# Patient Record
Sex: Female | Born: 1945 | Race: White | Hispanic: No | Marital: Married | Smoking: Former smoker
Health system: Southern US, Community
[De-identification: ages and names within clinical notes are randomized; demographics above are authoritative.]

## PROBLEM LIST (undated history)

## (undated) DIAGNOSIS — C4490 Unspecified malignant neoplasm of skin, unspecified: Secondary | ICD-10-CM

## (undated) DIAGNOSIS — E739 Lactose intolerance, unspecified: Secondary | ICD-10-CM

## (undated) DIAGNOSIS — F32A Depression, unspecified: Secondary | ICD-10-CM

## (undated) DIAGNOSIS — R011 Cardiac murmur, unspecified: Secondary | ICD-10-CM

## (undated) DIAGNOSIS — E785 Hyperlipidemia, unspecified: Secondary | ICD-10-CM

## (undated) DIAGNOSIS — M79609 Pain in unspecified limb: Secondary | ICD-10-CM

## (undated) DIAGNOSIS — Z86010 Personal history of colonic polyps: Secondary | ICD-10-CM

## (undated) DIAGNOSIS — F329 Major depressive disorder, single episode, unspecified: Secondary | ICD-10-CM

## (undated) DIAGNOSIS — E039 Hypothyroidism, unspecified: Secondary | ICD-10-CM

## (undated) DIAGNOSIS — D649 Anemia, unspecified: Secondary | ICD-10-CM

## (undated) HISTORY — DX: Lactose intolerance, unspecified: E73.9

## (undated) HISTORY — DX: Anemia, unspecified: D64.9

## (undated) HISTORY — DX: Personal history of colonic polyps: Z86.010

## (undated) HISTORY — PX: APPENDECTOMY: SHX54

## (undated) HISTORY — DX: Cardiac murmur, unspecified: R01.1

## (undated) HISTORY — PX: TONSILLECTOMY: SUR1361

## (undated) HISTORY — PX: VAGINAL HYSTERECTOMY: SUR661

## (undated) HISTORY — DX: Depression, unspecified: F32.A

## (undated) HISTORY — DX: Hyperlipidemia, unspecified: E78.5

## (undated) HISTORY — DX: Pain in unspecified limb: M79.609

## (undated) HISTORY — DX: Hypothyroidism, unspecified: E03.9

## (undated) HISTORY — DX: Major depressive disorder, single episode, unspecified: F32.9

---

## 1997-11-03 ENCOUNTER — Ambulatory Visit (HOSPITAL_COMMUNITY): Admission: RE | Admit: 1997-11-03 | Discharge: 1997-11-03 | Payer: Self-pay | Admitting: Gastroenterology

## 1998-02-06 ENCOUNTER — Other Ambulatory Visit: Admission: RE | Admit: 1998-02-06 | Discharge: 1998-02-06 | Payer: Self-pay | Admitting: Obstetrics and Gynecology

## 1998-02-07 ENCOUNTER — Other Ambulatory Visit: Admission: RE | Admit: 1998-02-07 | Discharge: 1998-02-07 | Payer: Self-pay | Admitting: Obstetrics and Gynecology

## 1998-05-16 ENCOUNTER — Ambulatory Visit (HOSPITAL_COMMUNITY): Admission: RE | Admit: 1998-05-16 | Discharge: 1998-05-16 | Payer: Self-pay | Admitting: Obstetrics and Gynecology

## 1998-11-26 ENCOUNTER — Observation Stay (HOSPITAL_COMMUNITY): Admission: RE | Admit: 1998-11-26 | Discharge: 1998-11-27 | Payer: Self-pay | Admitting: Obstetrics and Gynecology

## 1998-11-26 ENCOUNTER — Encounter (INDEPENDENT_AMBULATORY_CARE_PROVIDER_SITE_OTHER): Payer: Self-pay | Admitting: *Deleted

## 2000-08-17 ENCOUNTER — Other Ambulatory Visit: Admission: RE | Admit: 2000-08-17 | Discharge: 2000-08-17 | Payer: Self-pay | Admitting: Obstetrics and Gynecology

## 2001-05-24 ENCOUNTER — Ambulatory Visit (HOSPITAL_COMMUNITY): Admission: RE | Admit: 2001-05-24 | Discharge: 2001-05-24 | Payer: Self-pay | Admitting: Internal Medicine

## 2001-05-24 ENCOUNTER — Encounter: Payer: Self-pay | Admitting: Internal Medicine

## 2002-05-25 ENCOUNTER — Encounter: Payer: Self-pay | Admitting: Internal Medicine

## 2002-05-25 ENCOUNTER — Ambulatory Visit (HOSPITAL_COMMUNITY): Admission: RE | Admit: 2002-05-25 | Discharge: 2002-05-25 | Payer: Self-pay | Admitting: Internal Medicine

## 2003-05-29 ENCOUNTER — Ambulatory Visit (HOSPITAL_COMMUNITY): Admission: RE | Admit: 2003-05-29 | Discharge: 2003-05-29 | Payer: Self-pay | Admitting: Obstetrics and Gynecology

## 2003-07-04 ENCOUNTER — Encounter: Admission: RE | Admit: 2003-07-04 | Discharge: 2003-07-04 | Payer: Self-pay | Admitting: Obstetrics and Gynecology

## 2004-05-10 ENCOUNTER — Ambulatory Visit: Payer: Self-pay | Admitting: Internal Medicine

## 2004-05-14 ENCOUNTER — Ambulatory Visit: Payer: Self-pay | Admitting: Internal Medicine

## 2004-05-24 ENCOUNTER — Ambulatory Visit: Payer: Self-pay | Admitting: Internal Medicine

## 2004-05-30 ENCOUNTER — Ambulatory Visit (HOSPITAL_COMMUNITY): Admission: RE | Admit: 2004-05-30 | Discharge: 2004-05-30 | Payer: Self-pay | Admitting: Internal Medicine

## 2005-04-05 ENCOUNTER — Ambulatory Visit: Payer: Self-pay | Admitting: Internal Medicine

## 2005-05-09 ENCOUNTER — Ambulatory Visit: Payer: Self-pay | Admitting: Internal Medicine

## 2005-05-16 ENCOUNTER — Ambulatory Visit: Payer: Self-pay | Admitting: Internal Medicine

## 2005-05-22 ENCOUNTER — Encounter: Payer: Self-pay | Admitting: Internal Medicine

## 2005-05-22 ENCOUNTER — Ambulatory Visit: Payer: Self-pay | Admitting: *Deleted

## 2005-09-23 ENCOUNTER — Ambulatory Visit: Payer: Self-pay | Admitting: Internal Medicine

## 2005-09-25 ENCOUNTER — Ambulatory Visit: Payer: Self-pay | Admitting: Cardiology

## 2005-12-01 ENCOUNTER — Ambulatory Visit: Payer: Self-pay | Admitting: Internal Medicine

## 2006-03-20 ENCOUNTER — Ambulatory Visit: Payer: Self-pay | Admitting: Internal Medicine

## 2006-03-31 ENCOUNTER — Ambulatory Visit: Payer: Self-pay | Admitting: Internal Medicine

## 2006-05-05 ENCOUNTER — Ambulatory Visit: Payer: Self-pay | Admitting: Internal Medicine

## 2006-05-05 LAB — CONVERTED CEMR LAB
Albumin: 3.4 g/dL — ABNORMAL LOW (ref 3.5–5.2)
BUN: 21 mg/dL (ref 6–23)
CO2: 30 meq/L (ref 19–32)
Chloride: 108 meq/L (ref 96–112)
Chol/HDL Ratio, serum: 2.4
Cholesterol: 154 mg/dL (ref 0–200)
Creatinine, Ser: 1.3 mg/dL — ABNORMAL HIGH (ref 0.4–1.2)
Eosinophil percent: 12.1 % — ABNORMAL HIGH (ref 0.0–5.0)
GFR calc non Af Amer: 44 mL/min
HCT: 37.4 % (ref 36.0–46.0)
Lymphocytes Relative: 52.4 % — ABNORMAL HIGH (ref 12.0–46.0)
Monocytes Relative: 7.3 % (ref 3.0–11.0)
Neutro Abs: 1.9 10*3/uL (ref 1.4–7.7)
RBC: 4.14 M/uL (ref 3.87–5.11)
RDW: 13 % (ref 11.5–14.6)
TSH: 8.82 microintl units/mL — ABNORMAL HIGH (ref 0.35–5.50)
Total Bilirubin: 0.8 mg/dL (ref 0.3–1.2)
Total Protein: 6 g/dL (ref 6.0–8.3)
Triglyceride fasting, serum: 45 mg/dL (ref 0–149)
VLDL: 9 mg/dL (ref 0–40)

## 2006-05-14 ENCOUNTER — Ambulatory Visit: Payer: Self-pay | Admitting: Internal Medicine

## 2006-10-12 ENCOUNTER — Ambulatory Visit: Payer: Self-pay | Admitting: Internal Medicine

## 2006-10-13 LAB — CONVERTED CEMR LAB
BUN: 28 mg/dL — ABNORMAL HIGH (ref 6–23)
Creatinine, Ser: 1.1 mg/dL (ref 0.4–1.2)

## 2006-10-14 ENCOUNTER — Ambulatory Visit: Payer: Self-pay | Admitting: Internal Medicine

## 2006-10-15 ENCOUNTER — Ambulatory Visit: Payer: Self-pay | Admitting: Cardiology

## 2007-01-26 ENCOUNTER — Encounter: Payer: Self-pay | Admitting: Internal Medicine

## 2007-02-05 DIAGNOSIS — E785 Hyperlipidemia, unspecified: Secondary | ICD-10-CM

## 2007-02-05 DIAGNOSIS — E039 Hypothyroidism, unspecified: Secondary | ICD-10-CM | POA: Insufficient documentation

## 2007-02-05 HISTORY — DX: Hypothyroidism, unspecified: E03.9

## 2007-02-05 HISTORY — DX: Hyperlipidemia, unspecified: E78.5

## 2007-03-05 ENCOUNTER — Ambulatory Visit: Payer: Self-pay | Admitting: Internal Medicine

## 2007-05-04 ENCOUNTER — Ambulatory Visit (HOSPITAL_COMMUNITY): Admission: RE | Admit: 2007-05-04 | Discharge: 2007-05-04 | Payer: Self-pay | Admitting: Internal Medicine

## 2007-05-10 ENCOUNTER — Ambulatory Visit: Payer: Self-pay | Admitting: Internal Medicine

## 2007-05-10 LAB — CONVERTED CEMR LAB
ALT: 25 units/L (ref 0–35)
AST: 43 units/L — ABNORMAL HIGH (ref 0–37)
Albumin: 3.7 g/dL (ref 3.5–5.2)
Alkaline Phosphatase: 72 units/L (ref 39–117)
Basophils Relative: 0.5 % (ref 0.0–1.0)
CO2: 30 meq/L (ref 19–32)
Chloride: 108 meq/L (ref 96–112)
GFR calc Af Amer: 72 mL/min
GFR calc non Af Amer: 60 mL/min
Glucose, Bld: 122 mg/dL — ABNORMAL HIGH (ref 70–99)
LDL Cholesterol: 121 mg/dL — ABNORMAL HIGH (ref 0–99)
Lymphocytes Relative: 53.8 % — ABNORMAL HIGH (ref 12.0–46.0)
MCHC: 33.7 g/dL (ref 30.0–36.0)
MCV: 91.3 fL (ref 78.0–100.0)
Monocytes Relative: 9.6 % (ref 3.0–11.0)
Neutro Abs: 1.7 10*3/uL (ref 1.4–7.7)
Potassium: 4.2 meq/L (ref 3.5–5.1)
RBC: 4.07 M/uL (ref 3.87–5.11)
RDW: 13.2 % (ref 11.5–14.6)
Sodium: 146 meq/L — ABNORMAL HIGH (ref 135–145)
Total CHOL/HDL Ratio: 3.1
Total Protein: 6.2 g/dL (ref 6.0–8.3)
Triglycerides: 62 mg/dL (ref 0–149)
Urobilinogen, UA: NEGATIVE
VLDL: 12 mg/dL (ref 0–40)

## 2007-05-17 ENCOUNTER — Ambulatory Visit: Payer: Self-pay | Admitting: Internal Medicine

## 2007-05-17 DIAGNOSIS — R011 Cardiac murmur, unspecified: Secondary | ICD-10-CM | POA: Insufficient documentation

## 2007-05-17 DIAGNOSIS — Z860100 Personal history of colon polyps, unspecified: Secondary | ICD-10-CM

## 2007-05-17 HISTORY — DX: Cardiac murmur, unspecified: R01.1

## 2007-05-17 HISTORY — DX: Personal history of colon polyps, unspecified: Z86.0100

## 2007-05-17 HISTORY — DX: Personal history of colonic polyps: Z86.010

## 2007-06-10 ENCOUNTER — Telehealth (INDEPENDENT_AMBULATORY_CARE_PROVIDER_SITE_OTHER): Payer: Self-pay | Admitting: *Deleted

## 2007-06-11 ENCOUNTER — Telehealth (INDEPENDENT_AMBULATORY_CARE_PROVIDER_SITE_OTHER): Payer: Self-pay | Admitting: *Deleted

## 2007-06-24 ENCOUNTER — Ambulatory Visit: Payer: Self-pay

## 2007-06-24 ENCOUNTER — Encounter: Payer: Self-pay | Admitting: Internal Medicine

## 2007-06-25 ENCOUNTER — Telehealth (INDEPENDENT_AMBULATORY_CARE_PROVIDER_SITE_OTHER): Payer: Self-pay

## 2007-07-06 ENCOUNTER — Telehealth: Payer: Self-pay | Admitting: Internal Medicine

## 2007-07-16 ENCOUNTER — Telehealth: Payer: Self-pay | Admitting: Internal Medicine

## 2007-08-30 ENCOUNTER — Ambulatory Visit: Payer: Self-pay | Admitting: Internal Medicine

## 2007-09-24 ENCOUNTER — Telehealth: Payer: Self-pay | Admitting: Internal Medicine

## 2007-11-18 DIAGNOSIS — C4492 Squamous cell carcinoma of skin, unspecified: Secondary | ICD-10-CM

## 2007-11-18 HISTORY — DX: Squamous cell carcinoma of skin, unspecified: C44.92

## 2007-11-23 ENCOUNTER — Ambulatory Visit: Payer: Self-pay | Admitting: Internal Medicine

## 2007-11-23 DIAGNOSIS — E739 Lactose intolerance, unspecified: Secondary | ICD-10-CM

## 2007-11-23 HISTORY — DX: Lactose intolerance, unspecified: E73.9

## 2008-01-25 ENCOUNTER — Telehealth: Payer: Self-pay | Admitting: Internal Medicine

## 2008-03-07 ENCOUNTER — Ambulatory Visit: Payer: Self-pay | Admitting: Internal Medicine

## 2008-05-09 ENCOUNTER — Ambulatory Visit: Payer: Self-pay | Admitting: Internal Medicine

## 2008-05-09 LAB — CONVERTED CEMR LAB
AST: 30 units/L (ref 0–37)
Albumin: 3.4 g/dL — ABNORMAL LOW (ref 3.5–5.2)
Alkaline Phosphatase: 67 units/L (ref 39–117)
BUN: 26 mg/dL — ABNORMAL HIGH (ref 6–23)
Basophils Relative: 1.6 % (ref 0.0–3.0)
Bilirubin, Direct: 0.1 mg/dL (ref 0.0–0.3)
CO2: 28 meq/L (ref 19–32)
Chloride: 110 meq/L (ref 96–112)
Cholesterol: 190 mg/dL (ref 0–200)
Eosinophils Absolute: 0.2 10*3/uL (ref 0.0–0.7)
Eosinophils Relative: 5.2 % — ABNORMAL HIGH (ref 0.0–5.0)
Glucose, Bld: 103 mg/dL — ABNORMAL HIGH (ref 70–99)
HDL: 69.6 mg/dL (ref >39.0)
LDL Cholesterol: 109 mg/dL — ABNORMAL HIGH (ref 0–99)
Monocytes Absolute: 0.3 10*3/uL (ref 0.1–1.0)
Neutro Abs: 1.2 10*3/uL — ABNORMAL LOW (ref 1.4–7.7)
Protein, U semiquant: NEGATIVE
Sodium: 143 meq/L (ref 135–145)
Specific Gravity, Urine: >=1.03
TSH: 5.91 microintl units/mL — ABNORMAL HIGH (ref 0.35–5.50)
Total Bilirubin: 0.6 mg/dL (ref 0.3–1.2)
Total CHOL/HDL Ratio: 2.7
Triglycerides: 58 mg/dL (ref 0–149)
VLDL: 12 mg/dL (ref 0–40)
WBC: 3.5 10*3/uL — ABNORMAL LOW (ref 4.5–10.5)

## 2008-05-16 ENCOUNTER — Ambulatory Visit (HOSPITAL_COMMUNITY): Admission: RE | Admit: 2008-05-16 | Discharge: 2008-05-16 | Payer: Self-pay | Admitting: Internal Medicine

## 2008-05-17 ENCOUNTER — Ambulatory Visit: Payer: Self-pay | Admitting: Internal Medicine

## 2008-05-17 DIAGNOSIS — D649 Anemia, unspecified: Secondary | ICD-10-CM

## 2008-05-17 HISTORY — DX: Anemia, unspecified: D64.9

## 2008-07-28 ENCOUNTER — Telehealth: Payer: Self-pay | Admitting: Internal Medicine

## 2008-07-31 DIAGNOSIS — M79609 Pain in unspecified limb: Secondary | ICD-10-CM

## 2008-07-31 HISTORY — DX: Pain in unspecified limb: M79.609

## 2008-08-05 ENCOUNTER — Encounter: Payer: Self-pay | Admitting: Internal Medicine

## 2008-08-09 ENCOUNTER — Encounter: Payer: Self-pay | Admitting: Internal Medicine

## 2008-11-01 ENCOUNTER — Ambulatory Visit: Payer: Self-pay | Admitting: Internal Medicine

## 2008-11-01 LAB — CONVERTED CEMR LAB
Albumin: 3.4 g/dL — ABNORMAL LOW (ref 3.5–5.2)
Alkaline Phosphatase: 66 units/L (ref 39–117)
BUN: 25 mg/dL — ABNORMAL HIGH (ref 6–23)
Bilirubin Urine: NEGATIVE
Bilirubin, Direct: 0 mg/dL (ref 0.0–0.3)
Blood in Urine, dipstick: NEGATIVE
Calcium: 8.4 mg/dL (ref 8.4–10.5)
Chloride: 114 meq/L — ABNORMAL HIGH (ref 96–112)
Creatinine, Ser: 0.8 mg/dL (ref 0.4–1.2)
Direct LDL: 135.3 mg/dL
Eosinophils Relative: 5 % (ref 0.0–5.0)
Glucose, Urine, Semiquant: NEGATIVE
HCT: 35.6 % — ABNORMAL LOW (ref 36.0–46.0)
HDL: 58.6 mg/dL (ref >39.00)
Ketones, urine, test strip: NEGATIVE
Lymphocytes Relative: 37.8 % (ref 12.0–46.0)
Lymphs Abs: 1.6 10*3/uL (ref 0.7–4.0)
MCV: 90.9 fL (ref 78.0–100.0)
Neutrophils Relative %: 47.6 % (ref 43.0–77.0)
Nitrite: NEGATIVE
Platelets: 184 10*3/uL (ref 150.0–400.0)
Potassium: 4.1 meq/L (ref 3.5–5.1)
RBC: 3.92 M/uL (ref 3.87–5.11)
RDW: 13 % (ref 11.5–14.6)
Sodium: 144 meq/L (ref 135–145)
Specific Gravity, Urine: 1.02
TSH: 3.86 microintl units/mL (ref 0.35–5.50)
Urobilinogen, UA: 0.2
VLDL: 16 mg/dL (ref 0.0–40.0)

## 2008-11-07 ENCOUNTER — Encounter: Payer: Self-pay | Admitting: Internal Medicine

## 2008-11-08 ENCOUNTER — Ambulatory Visit: Payer: Self-pay | Admitting: Internal Medicine

## 2009-02-22 ENCOUNTER — Ambulatory Visit: Payer: Self-pay | Admitting: Internal Medicine

## 2009-05-21 ENCOUNTER — Ambulatory Visit (HOSPITAL_COMMUNITY): Admission: RE | Admit: 2009-05-21 | Discharge: 2009-05-21 | Payer: Self-pay | Admitting: Internal Medicine

## 2009-08-08 DIAGNOSIS — C4491 Basal cell carcinoma of skin, unspecified: Secondary | ICD-10-CM

## 2009-08-08 HISTORY — DX: Basal cell carcinoma of skin, unspecified: C44.91

## 2009-12-10 ENCOUNTER — Ambulatory Visit: Payer: Self-pay | Admitting: Internal Medicine

## 2009-12-13 LAB — CONVERTED CEMR LAB
ALT: 26 units/L (ref 0–35)
Albumin: 3.8 g/dL (ref 3.5–5.2)
Alkaline Phosphatase: 77 units/L (ref 39–117)
Basophils Absolute: 0 10*3/uL (ref 0.0–0.1)
CO2: 32 meq/L (ref 19–32)
Calcium: 9.1 mg/dL (ref 8.4–10.5)
Chloride: 111 meq/L (ref 96–112)
Cholesterol: 229 mg/dL — ABNORMAL HIGH (ref 0–200)
Direct LDL: 141 mg/dL
Eosinophils Relative: 3.4 % (ref 0.0–5.0)
Glucose, Bld: 97 mg/dL (ref 70–99)
HCT: 37.5 % (ref 36.0–46.0)
Hemoglobin: 12.7 g/dL (ref 12.0–15.0)
Hgb A1c MFr Bld: 6 % (ref 4.6–6.5)
Lymphocytes Relative: 26.4 % (ref 12.0–46.0)
MCHC: 33.9 g/dL (ref 30.0–36.0)
Monocytes Relative: 6.7 % (ref 3.0–12.0)
Neutro Abs: 3.1 10*3/uL (ref 1.4–7.7)
Neutrophils Relative %: 63 % (ref 43.0–77.0)
Platelets: 224 10*3/uL (ref 150.0–400.0)
Potassium: 4.5 meq/L (ref 3.5–5.1)
RDW: 13.7 % (ref 11.5–14.6)
Triglycerides: 125 mg/dL (ref 0.0–149.0)
WBC: 5 10*3/uL (ref 4.5–10.5)

## 2010-02-14 ENCOUNTER — Ambulatory Visit: Payer: Self-pay | Admitting: Internal Medicine

## 2010-05-15 ENCOUNTER — Ambulatory Visit (HOSPITAL_COMMUNITY)
Admission: RE | Admit: 2010-05-15 | Discharge: 2010-05-15 | Payer: Self-pay | Source: Home / Self Care | Attending: Internal Medicine | Admitting: Internal Medicine

## 2010-06-04 ENCOUNTER — Ambulatory Visit
Admission: RE | Admit: 2010-06-04 | Discharge: 2010-06-04 | Payer: Self-pay | Source: Home / Self Care | Attending: Internal Medicine | Admitting: Internal Medicine

## 2010-06-15 ENCOUNTER — Encounter: Payer: Self-pay | Admitting: Obstetrics and Gynecology

## 2010-06-27 NOTE — Assessment & Plan Note (Signed)
Summary: 6 month ov//ccm/PT RSC/CJR/pt rescd//ccm   Vital Signs:  Patient profile:   65 year old female Weight:      203 pounds Temp:     97.9 degrees F oral BP sitting:   120 / 72  (left arm) Cuff size:   regular  Vitals Entered By: Duard Brady LPN (June 04, 2010 8:27 AM) CC: 6 mos rov - doing ok Is Patient Diabetic? No   CC:  6 mos rov - doing ok.  History of Present Illness: 61 -year-old patient who is seen today for her 6 month follow-up is followed by psychiatry for major depression.  Medical problems include impaired glucose tolerance, hypothyroidism, dyslipidemia.  She has a stable systolic murmur.  She was concerned today about recent publicity about statin associated diabetic risk.  Laboratory studies were reviewed from July and were all unremarkable.  This included a normal hemoglobin A1c and TSH.  She has successfully discontinued tobacco use as of October 1  Allergies (verified): No Known Drug Allergies  Past History:  Past Medical History: Reviewed history from 11/08/2008 and no changes required. Hyperlipidemia Hypothyroidism ongoing tobacco use bipolar depression Colonic polyps, hx of glucose intolerance systolic murmur  Past Surgical History: Reviewed history from 12/10/2009 and no changes required. Appendectomy Hysterectomy Age 7 colonoscopy 07-2008  2-D echocardiogram : January 2009  Family History: Reviewed history from 12/10/2009 and no changes required. mother died age 64.  History dementia father Age 53;   One brother history schizophrenia.  Two sisters positive for depression;   Social History: Reviewed history from 05/17/2007 and no changes required. Single d/c tobacco 02/23/10  Review of Systems  The patient denies anorexia, fever, weight loss, weight gain, vision loss, decreased hearing, hoarseness, chest pain, syncope, dyspnea on exertion, peripheral edema, prolonged cough, headaches, hemoptysis, abdominal pain, melena,  hematochezia, severe indigestion/heartburn, hematuria, incontinence, genital sores, muscle weakness, suspicious skin lesions, transient blindness, difficulty walking, depression, unusual weight change, abnormal bleeding, enlarged lymph nodes, angioedema, and breast masses.    Physical Exam  General:  overweight-appearing.  normal blood pressureoverweight-appearing.   Head:  Normocephalic and atraumatic without obvious abnormalities. No apparent alopecia or balding. Mouth:  Oral mucosa and oropharynx without lesions or exudates.  Teeth in good repair. Neck:  No deformities, masses, or tenderness noted. Lungs:  Normal respiratory effort, chest expands symmetrically. Lungs are clear to auscultation, no crackles or wheezes. Heart:  grade 2 to 3/6 systolic murmur Abdomen:  Bowel sounds positive,abdomen soft and non-tender without masses, organomegaly or hernias noted. Msk:  No deformity or scoliosis noted of thoracic or lumbar spine.   Pulses:  R and L carotid,radial,femoral,dorsalis pedis and posterior tibial pulses are full and equal bilaterally Extremities:  No clubbing, cyanosis, edema, or deformity noted with normal full range of motion of all joints.   Skin:  Intact without suspicious lesions or rashes Cervical Nodes:  No lymphadenopathy noted   Impression & Recommendations:  Problem # 1:  GLUCOSE INTOLERANCE (ICD-271.3)  Problem # 2:  SYSTOLIC MURMUR (ZOX-096.2)  Problem # 3:  HYPOTHYROIDISM (ICD-244.9)  Problem # 4:  HYPERLIPIDEMIA (ICD-272.4)  Her updated medication list for this problem includes:    Simvastatin 40 Mg Tabs (Simvastatin) ..... One daily  Her updated medication list for this problem includes:    Simvastatin 40 Mg Tabs (Simvastatin) ..... One daily  Complete Medication List: 1)  Multivitamins Tabs (Multiple vitamin) .... Once daily 2)  Calcium 500 500 Mg Tabs (Calcium carbonate) .... 2 two times a day 3)  Aspirin 81 Mg Tbec (Aspirin) .... Once daily 4)   Seroquel 400 Mg Tabs (Quetiapine fumarate) .... 2 qpm 5)  Equetro 300 Mg Cp12 (Carbamazepine (antipsychotic)) .... 4 once daily 6)  Simvastatin 40 Mg Tabs (Simvastatin) .... One daily 7)  Synthroid 75 Mcg Tabs (levothyroxine Sodium)  .Marland Kitchen.. 1 once daily 8)  Perphenazine 2 Mg Tabs (Perphenazine) .... 2 am, 2 pm  Patient Instructions: 1)  Please schedule a follow-up appointment in 6 months for CPX  2)  Advised not to eat any food or drink any liquids after 10 PM the night before your procedure. 3)  Limit your Sodium (Salt). 4)  It is important that you exercise regularly at least 20 minutes 5 times a week. If you develop chest pain, have severe difficulty breathing, or feel very tired , stop exercising immediately and seek medical attention. 5)  You need to lose weight. Consider a lower calorie diet and regular exercise.  6)  Check your blood sugars regularly. If your readings are usually above : or below 70 you should contact our office. 7)  It is important that your Diabetic A1c level is checked every 3 months.   Orders Added: 1)  Est. Patient Level IV [16109]  Appended Document: 6 month ov//ccm/PT RSC/CJR/pt rescd//ccm    CBG Result 108   Allergies: No Known Drug Allergies   Complete Medication List: 1)  Multivitamins Tabs (Multiple vitamin) .... Once daily 2)  Calcium 500 500 Mg Tabs (Calcium carbonate) .... 2 two times a day 3)  Aspirin 81 Mg Tbec (Aspirin) .... Once daily 4)  Seroquel 400 Mg Tabs (Quetiapine fumarate) .... 2 qpm 5)  Equetro 300 Mg Cp12 (Carbamazepine (antipsychotic)) .... 4 once daily 6)  Simvastatin 40 Mg Tabs (Simvastatin) .... One daily 7)  Synthroid 75 Mcg Tabs (levothyroxine Sodium)  .Marland Kitchen.. 1 once daily 8)  Perphenazine 2 Mg Tabs (Perphenazine) .... 2 am, 2 pm  Other Orders: Capillary Blood Glucose/CBG (60454)   Orders Added: 1)  Capillary Blood Glucose/CBG [09811]

## 2010-06-27 NOTE — Letter (Signed)
Summary: [facility]  [facility]   Imported By: Maryln Gottron 06/14/2009 10:10:36  _____________________________________________________________________  External Attachment:    Type:   Image     Comment:   External Document

## 2010-06-27 NOTE — Assessment & Plan Note (Signed)
Summary: pt will come in fasting/njr rsc bmp/njr   Vital Signs:  Patient profile:   65 year old female Height:      68 inches Weight:      200 pounds BMI:     30.52 Temp:     98.0 degrees F oral BP sitting:   110 / 70  (right arm) Cuff size:   regular  Vitals Entered By: Duard Brady LPN (December 10, 2009 9:29 AM) CC: cpx- doing well Is Patient Diabetic? No   CC:  cpx- doing well.  History of Present Illness: 65 year old patient who is seen today for an annual comprehensive evaluation.  Medical problems include an impaired glucose tolerance.  She has history of colonic polyps and had a colonoscopy in March of last year.  She has treated hypothyroidism and dyslipidemia.  She is followed by psychiatry for bipolar depression. Here for Medicare AWV:  1.   Risk factors based on Past M, S, F history:  risk factors include a history of ongoing tobacco use, and dyslipidemia.  She also has a history of colonic polyps 2.   Physical Activities: walks and swims without exercise limitation 3.   Depression/mood: history bipolar depression, followed by psychiatry 4.   Hearing: no deficits 5.   ADL's: completely independent in all aspects of daily living 6.   Fall Risk: low 7.   Home Safety: no problems  identified 8.   Height, weight, &visual acuity: height and weight are stable.  No difficulty with visual acuity 9.   Counseling: smoking cessation discussed 10.   Labs ordered based on risk factors: laboratory profile, including TSH, and lipid profile will be reviewed 11.           Referral Coordination- will continue gynecologic and psychiatric follow-up 12.           Care Plan- heart healthy diet more regular exercise.  Encouraged will need follow-up colonoscopy in 4 years 13.            Cognitive Assessment- alert and oriented, with normal affect   Preventive Screening-Counseling & Management  Alcohol-Tobacco     Smoking Status: current     Smoking Cessation Counseling:  yes  Allergies (verified): No Known Drug Allergies  Past History:  Past Medical History: Reviewed history from 11/08/2008 and no changes required. Hyperlipidemia Hypothyroidism ongoing tobacco use bipolar depression Colonic polyps, hx of glucose intolerance systolic murmur  Past Surgical History: Appendectomy Hysterectomy Age 87 colonoscopy 07-2008  2-D echocardiogram : January 2009  Family History: Reviewed history from 11/08/2008 and no changes required. mother died age 69.  History dementia father Age 44;   One brother history schizophrenia.  Two sisters positive for depression;   Social History: Reviewed history from 05/17/2007 and no changes required. Single Smoking Status:  current  Review of Systems       The patient complains of depression.  The patient denies anorexia, fever, weight loss, weight gain, vision loss, decreased hearing, hoarseness, chest pain, syncope, dyspnea on exertion, peripheral edema, prolonged cough, headaches, hemoptysis, abdominal pain, melena, hematochezia, severe indigestion/heartburn, hematuria, incontinence, genital sores, muscle weakness, suspicious skin lesions, transient blindness, difficulty walking, unusual weight change, abnormal bleeding, enlarged lymph nodes, angioedema, and breast masses.    Physical Exam  General:  overweight-appearing.  normal blood pressure Head:  Normocephalic and atraumatic without obvious abnormalities. No apparent alopecia or balding. Eyes:  No corneal or conjunctival inflammation noted. EOMI. Perrla. Funduscopic exam benign, without hemorrhages, exudates or papilledema. Vision  grossly normal. Ears:  External ear exam shows no significant lesions or deformities.  Otoscopic examination reveals clear canals, tympanic membranes are intact bilaterally without bulging, retraction, inflammation or discharge. Hearing is grossly normal bilaterally. Nose:  External nasal examination shows no deformity or  inflammation. Nasal mucosa are pink and moist without lesions or exudates. Mouth:  Oral mucosa and oropharynx without lesions or exudates.  Teeth in good repair. Neck:  No deformities, masses, or tenderness noted. Chest Wall:  No deformities, masses, or tenderness noted. Breasts:  No mass, nodules, thickening, tenderness, bulging, retraction, inflamation, nipple discharge or skin changes noted.   Lungs:  Normal respiratory effort, chest expands symmetrically. Lungs are clear to auscultation, no crackles or wheezes. Heart:  normal S1, S2 grade 2/6 systolic murmur Abdomen:  Bowel sounds positive,abdomen soft and non-tender without masses, organomegaly or hernias noted. Msk:  No deformity or scoliosis noted of thoracic or lumbar spine.   Pulses:  R and L carotid,radial,femoral,dorsalis pedis and posterior tibial pulses are full and equal bilaterally Extremities:  No clubbing, cyanosis, edema, or deformity noted with normal full range of motion of all joints.   Neurologic:  No cranial nerve deficits noted. Station and gait are normal. Plantar reflexes are down-going bilaterally. DTRs are symmetrical throughout. Sensory, motor and coordinative functions appear intact. Skin:  Intact without suspicious lesions or rashes Cervical Nodes:  No lymphadenopathy noted Axillary Nodes:  No palpable lymphadenopathy Inguinal Nodes:  No significant adenopathy Psych:  Cognition and judgment appear intact. Alert and cooperative with normal attention span and concentration. No apparent delusions, illusions, hallucinations   Impression & Recommendations:  Problem # 1:  PHYSICAL EXAMINATION (ICD-V70.0)  Orders: EKG w/ Interpretation (93000) First annual wellness visit with prevention plan  (Z6109)  Complete Medication List: 1)  Multivitamins Tabs (Multiple vitamin) .... Once daily 2)  Calcium 500 500 Mg Tabs (Calcium carbonate) .... 2 two times a day 3)  Aspirin 81 Mg Tbec (Aspirin) .... Once daily 4)   Vitamin E 400 Unit Caps (Vitamin e) .... Once daily 5)  Seroquel 400 Mg Tabs (Quetiapine fumarate) .... 2 qpm 6)  Equetro 300 Mg Cp12 (Carbamazepine (antipsychotic)) .... 4 once daily 7)  Simvastatin 40 Mg Tabs (Simvastatin) .... One daily 8)  Synthroid 75 Mcg Tabs (levothyroxine Sodium)  .Marland Kitchen.. 1 once daily 9)  Perphenazine 2 Mg Tabs (Perphenazine) .... 2 am, 2 pm  Other Orders: Venipuncture (60454) TLB-Lipid Panel (80061-LIPID) TLB-BMP (Basic Metabolic Panel-BMET) (80048-METABOL) TLB-CBC Platelet - w/Differential (85025-CBCD) TLB-TSH (Thyroid Stimulating Hormone) (84443-TSH) TLB-Hepatic/Liver Function Pnl (80076-HEPATIC) TLB-A1C / Hgb A1C (Glycohemoglobin) (83036-A1C)  Patient Instructions: 1)  Please schedule a follow-up appointment in 6 months. 2)  Limit your Sodium (Salt) to less than 2 grams a day(slightly less than 1/2 a teaspoon) to prevent fluid retention, swelling, or worsening of symptoms. 3)  Tobacco is very bad for your health and your loved ones! You Should stop smoking!. 4)  It is important that you exercise regularly at least 20 minutes 5 times a week. If you develop chest pain, have severe difficulty breathing, or feel very tired , stop exercising immediately and seek medical attention. 5)  You need to lose weight. Consider a lower calorie diet and regular exercise.  Prescriptions: SYNTHROID 75 MCG  TABS (LEVOTHYROXINE SODIUM) 1 once daily  #90 x 6   Entered and Authorized by:   Gordy Savers  MD   Signed by:   Gordy Savers  MD on 12/10/2009   Method used:   Print  then Give to Patient   RxID:   [id number] SIMVASTATIN 40 MG  TABS (SIMVASTATIN) one daily  #90 x 6   Entered and Authorized by:   Gordy Savers  MD   Signed by:   Gordy Savers  MD on 12/10/2009   Method used:   Print then Give to Patient   RxID:   [id number] SYNTHROID 75 MCG  TABS (LEVOTHYROXINE SODIUM) 1 once daily  #90 x 6   Entered and Authorized by:   Gordy Savers  MD   Signed by:   Gordy Savers  MD on 12/10/2009   Method used:   Faxed to ...       [facility] (mail-order)             , [place]         Ph: [phone]       Fax: [phone]   RxID:   [id number] SIMVASTATIN 40 MG  TABS (SIMVASTATIN) one daily  #90 x 6   Entered and Authorized by:   Gordy Savers  MD   Signed by:   Gordy Savers  MD on 12/10/2009   Method used:   Faxed to ...       [facility] (mail-order)             , [place]         Ph: [phone]       Fax: [phone]   RxID:   [id number]

## 2010-06-27 NOTE — Assessment & Plan Note (Signed)
Summary: 6 month ov//ccm/PT RSC/CJR   Allergies: No Known Drug Allergies   Complete Medication List: 1)  Multivitamins Tabs (Multiple vitamin) .... Once daily 2)  Calcium 500 500 Mg Tabs (Calcium carbonate) .... 2 two times a day 3)  Aspirin 81 Mg Tbec (Aspirin) .... Once daily 4)  Vitamin E 400 Unit Caps (Vitamin e) .... Once daily 5)  Seroquel 400 Mg Tabs (Quetiapine fumarate) .... 2 qpm 6)  Equetro 300 Mg Cp12 (Carbamazepine (antipsychotic)) .... 4 once daily 7)  Simvastatin 40 Mg Tabs (Simvastatin) .... One daily 8)  Synthroid 75 Mcg Tabs (levothyroxine Sodium)  .Marland Kitchen.. 1 once daily 9)  Perphenazine 2 Mg Tabs (Perphenazine) .... 2 am, 2 pm

## 2010-06-27 NOTE — Assessment & Plan Note (Signed)
Summary: FLU SHOT//CCM  Nurse Visit   Allergies: No Known Drug Allergies  Orders Added: 1)  Flu Vaccine 25yrs + MEDICARE PATIENTS [Q2039] 2)  Administration Flu vaccine - MCR [G0008] Flu Vaccine Consent Questions     Do you have a history of severe allergic reactions to this vaccine? no    Any prior history of allergic reactions to egg and/or gelatin? no    Do you have a sensitivity to the preservative Thimersol? no    Do you have a past history of Guillan-Barre Syndrome? no    Do you currently have an acute febrile illness? no    Have you ever had a severe reaction to latex? no    Vaccine information given and explained to patient? yes    Are you currently pregnant? no    Lot Number:[id number]   Exp Date:11/23/2010   Site Given  Left Deltoid [email or web address].lbmedflu

## 2010-07-04 ENCOUNTER — Telehealth: Payer: Self-pay | Admitting: *Deleted

## 2010-07-04 NOTE — Telephone Encounter (Signed)
Spoke with pt - concerned about diabetic instruction #6 and 7 listed on last pt instruction sheet at office visit 06/03/10 - instructed to disregurd - pt is not diabetic. KIK

## 2010-07-04 NOTE — Telephone Encounter (Signed)
Pt wants to talk to Selena Batten about her last check out sheet.

## 2010-10-11 NOTE — Assessment & Plan Note (Signed)
[facility] OFFICE NOTE   NAME:Rebecca Novak, Rebecca Novak                  MRN:          [id number]  DATE:05/14/2006                            DOB:          [birth year]    This is a 65 year old female seen today for an annual exam. She is  followed closely by Dr. Jennelle Human for bipolar disease. She has  hyperlipidemia and was seen by Dr. Lequita Halt recently for knee pain. She  is doing well. Operations have included a remote appendectomy and  hysterectomy. She has a history of chronic polyps. Her last colonoscopy  was in 2005.   FAMILY HISTORY:  Father is 85 and doing well. Her mother died at 35 with  dementia. One brother has schizophrenia, two sisters positive for  depression.   PHYSICAL EXAMINATION:  GENERAL: Revealed a healthy appearing female in  no acute distress.  VITAL SIGNS: Blood pressure 140/70.  SKIN: Negative.  HEENT: Clear.  NECK: No adenopathy or bruits.  CHEST: Clear.  BREAST: Negative.  CARDIOVASCULAR: Normal heart sounds. There was a grade 2/6 systolic  murmur loudest at the base.  ABDOMEN: Soft, nontender, no organomegaly.  EXTREMITIES: Negative. Full peripheral pulses.   IMPRESSION:  Hyperlipidemia; bipolar depression; chronic polyps.   DISPOSITION:  Medical regimen unchanged. She is scheduled for a followup  CAT scan of the chest in 6 months. We will recheck at that time and  consider a 2D echocardiogram.     Gordy Savers, MD  Electronically Signed    PFK/MedQ  DD: 05/14/2006  DT: 05/15/2006  Job #: [id number]

## 2010-12-16 ENCOUNTER — Encounter: Payer: Self-pay | Admitting: Internal Medicine

## 2010-12-18 ENCOUNTER — Telehealth: Payer: Self-pay

## 2010-12-18 ENCOUNTER — Ambulatory Visit (INDEPENDENT_AMBULATORY_CARE_PROVIDER_SITE_OTHER): Payer: Medicare Other | Admitting: Internal Medicine

## 2010-12-18 ENCOUNTER — Encounter: Payer: Self-pay | Admitting: Internal Medicine

## 2010-12-18 DIAGNOSIS — Z23 Encounter for immunization: Secondary | ICD-10-CM

## 2010-12-18 DIAGNOSIS — E039 Hypothyroidism, unspecified: Secondary | ICD-10-CM

## 2010-12-18 DIAGNOSIS — Z86010 Personal history of colonic polyps: Secondary | ICD-10-CM

## 2010-12-18 DIAGNOSIS — Z0000 Encounter for general adult medical examination without abnormal findings: Secondary | ICD-10-CM

## 2010-12-18 DIAGNOSIS — R7302 Impaired glucose tolerance (oral): Secondary | ICD-10-CM

## 2010-12-18 DIAGNOSIS — E785 Hyperlipidemia, unspecified: Secondary | ICD-10-CM

## 2010-12-18 DIAGNOSIS — R7309 Other abnormal glucose: Secondary | ICD-10-CM

## 2010-12-18 LAB — LIPID PANEL
Total CHOL/HDL Ratio: 3
Triglycerides: 102 mg/dL (ref 0.0–149.0)
VLDL: 20.4 mg/dL (ref 0.0–40.0)

## 2010-12-18 LAB — HEPATIC FUNCTION PANEL
ALT: 26 U/L (ref 0–35)
Bilirubin, Direct: 0 mg/dL (ref 0.0–0.3)
Total Bilirubin: 0.4 mg/dL (ref 0.3–1.2)
Total Protein: 7.2 g/dL (ref 6.0–8.3)

## 2010-12-18 LAB — CBC WITH DIFFERENTIAL/PLATELET
HCT: 38 % (ref 36.0–46.0)
Hemoglobin: 12.8 g/dL (ref 12.0–15.0)
Lymphocytes Relative: 36.3 % (ref 12.0–46.0)
Lymphs Abs: 1.9 10*3/uL (ref 0.7–4.0)
MCHC: 33.6 g/dL (ref 30.0–36.0)
Monocytes Absolute: 0.4 10*3/uL (ref 0.1–1.0)
Neutro Abs: 2.7 10*3/uL (ref 1.4–7.7)
RDW: 14 % (ref 11.5–14.6)

## 2010-12-18 LAB — GLUCOSE, POCT (MANUAL RESULT ENTRY): POC Glucose: 109

## 2010-12-18 LAB — BASIC METABOLIC PANEL
CO2: 29 mEq/L (ref 19–32)
Calcium: 8.9 mg/dL (ref 8.4–10.5)
Chloride: 107 mEq/L (ref 96–112)
Sodium: 143 mEq/L (ref 135–145)

## 2010-12-18 LAB — TSH: TSH: 4.22 u[IU]/mL (ref 0.35–5.50)

## 2010-12-18 LAB — HEMOGLOBIN A1C: Hgb A1c MFr Bld: 6.4 % (ref 4.6–6.5)

## 2010-12-18 MED ORDER — SIMVASTATIN 40 MG PO TABS: 40.0000 mg | ORAL_TABLET | Freq: Every day | ORAL | Status: DC

## 2010-12-18 MED ORDER — LEVOTHYROXINE SODIUM 75 MCG PO TABS: 75.0000 ug | ORAL_TABLET | Freq: Every day | ORAL | Status: DC

## 2010-12-18 NOTE — Telephone Encounter (Signed)
Pt's scripts were sent to [facility] and pt would like them sent to [facility].

## 2010-12-18 NOTE — Patient Instructions (Signed)
Limit your sodium (Salt) intake    It is important that you exercise regularly, at least 20 minutes 3 to 4 times per week.  If you develop chest pain or shortness of breath seek  medical attention.  Return in one year for follow-up   

## 2010-12-18 NOTE — Progress Notes (Signed)
Subjective:    Patient ID: Rebecca Novak, female    DOB: Oct 30, 1945, 65 y.o.   MRN: [id number]  HPI History of Present Illness:   65 year old patient who is seen today for an annual comprehensive evaluation. Medical problems include an impaired glucose tolerance. She has history of colonic polyps and had a colonoscopy in March of last year. She has treated hypothyroidism and dyslipidemia. She is followed by psychiatry for bipolar depression.   Here for Medicare AWV:   1. Risk factors based on Past M, S, F history: risk factors include a history of  tobacco use; discontinued tobacco in October of 2011, and dyslipidemia. She also has a history of colonic polyps  2. Physical Activities: walks and swims without exercise limitation  3. Depression/mood: history bipolar depression, followed by psychiatry  4. Hearing: no deficits  5. ADL's: completely independent in all aspects of daily living  6. Fall Risk: low  7. Home Safety: no problems identified  8. Height, weight, &visual acuity: height and weight are stable. No difficulty with visual acuity  9. Counseling: smoking cessation discussed  10. Labs ordered based on risk factors: laboratory profile, including TSH, and lipid profile will be reviewed  11. Referral Coordination- will continue gynecologic and psychiatric follow-up  12. Care Plan- heart healthy diet more regular exercise. Encouraged will need follow-up colonoscopy in 4 years  13. Cognitive Assessment- alert and oriented, with normal affect   Preventive Screening-Counseling & Management  Alcohol-Tobacco  Smoking Status: current  Smoking Cessation Counseling: yes   Allergies (verified):  No Known Drug Allergies   Past History:  Past Medical History:  Reviewed history from 11/08/2008 and no changes required.  Hyperlipidemia  Hypothyroidism  ongoing tobacco use  bipolar depression  Colonic polyps, hx of  glucose intolerance  systolic murmur   Past Surgical History:   Appendectomy  Hysterectomy Age 10  colonoscopy 07-2008  2-D echocardiogram : January 2009   Family History:  Reviewed history from 11/08/2008 and no changes required.  mother died age 4. History dementia  father Age 50;  One brother history schizophrenia. Two sisters positive for depression;  Social History:  Reviewed history from 05/17/2007 and no changes required.  Single  Smoking Status: current     Review of Systems  Constitutional: Negative for fever, appetite change, fatigue and unexpected weight change.  HENT: Negative for hearing loss, ear pain, nosebleeds, congestion, sore throat, mouth sores, trouble swallowing, neck stiffness, dental problem, voice change, sinus pressure and tinnitus.   Eyes: Negative for photophobia, pain, redness and visual disturbance.  Respiratory: Negative for cough, chest tightness and shortness of breath.   Cardiovascular: Negative for chest pain, palpitations and leg swelling.  Gastrointestinal: Negative for nausea, vomiting, abdominal pain, diarrhea, constipation, blood in stool, abdominal distention and rectal pain.  Genitourinary: Negative for dysuria, urgency, frequency, hematuria, flank pain, vaginal bleeding, vaginal discharge, difficulty urinating, genital sores, vaginal pain, menstrual problem and pelvic pain.  Musculoskeletal: Negative for back pain and arthralgias.  Skin: Negative for rash.  Neurological: Negative for dizziness, syncope, speech difficulty, weakness, light-headedness, numbness and headaches.  Hematological: Negative for adenopathy. Does not bruise/bleed easily.  Psychiatric/Behavioral: Negative for suicidal ideas, behavioral problems, self-injury, dysphoric mood and agitation. The patient is not nervous/anxious.        Objective:   Physical Exam  Constitutional: She is oriented to person, place, and time. She appears well-developed and well-nourished.  HENT:  Head: Normocephalic and atraumatic.  Right Ear: External  ear normal.  Left Ear: External ear normal.  Mouth/Throat: Oropharynx is clear and moist.  Eyes: Conjunctivae and EOM are normal.  Neck: Normal range of motion. Neck supple. No JVD present. No thyromegaly present.  Cardiovascular: Normal rate, regular rhythm, normal heart sounds and intact distal pulses.   No murmur heard. Pulmonary/Chest: Effort normal and breath sounds normal. She has no wheezes. She has no rales.  Abdominal: Soft. Bowel sounds are normal. She exhibits no distension and no mass. There is no tenderness. There is no rebound and no guarding.  Musculoskeletal: Normal range of motion. She exhibits no edema and no tenderness.  Neurological: She is alert and oriented to person, place, and time. She has normal reflexes. No cranial nerve deficit. She exhibits normal muscle tone. Coordination normal.  Skin: Skin is warm and dry. No rash noted.  Psychiatric: She has a normal mood and affect. Her behavior is normal.          Assessment & Plan:    Annual health maintenance History of glucose intolerance. We'll check a hemoglobin A1c Dyslipidemia. We'll check a lipid profile. Continue simvastatin 40 mg daily Colonic  polyps. Redo colonoscopy 2015

## 2010-12-26 ENCOUNTER — Telehealth: Payer: Self-pay | Admitting: Internal Medicine

## 2010-12-26 NOTE — Telephone Encounter (Signed)
Copy ready for pick up. Left message on machine for patient. 

## 2010-12-26 NOTE — Telephone Encounter (Signed)
Had labs done last week and would like a copy to pick up. Thanks.

## 2011-01-20 ENCOUNTER — Telehealth: Payer: Self-pay | Admitting: *Deleted

## 2011-01-20 NOTE — Telephone Encounter (Signed)
Pt is asking if she needs to be tested for Hep C as she has seen on TV????

## 2011-01-20 NOTE — Telephone Encounter (Signed)
Pt notified of Dr. Charm Rings recommendations.

## 2011-01-20 NOTE — Telephone Encounter (Signed)
Will consider at time of her next ROV

## 2011-03-06 ENCOUNTER — Ambulatory Visit (INDEPENDENT_AMBULATORY_CARE_PROVIDER_SITE_OTHER): Payer: Medicare Other

## 2011-03-06 DIAGNOSIS — Z23 Encounter for immunization: Secondary | ICD-10-CM

## 2011-04-03 ENCOUNTER — Other Ambulatory Visit: Payer: Self-pay | Admitting: Internal Medicine

## 2011-04-03 DIAGNOSIS — Z1231 Encounter for screening mammogram for malignant neoplasm of breast: Secondary | ICD-10-CM

## 2011-04-18 ENCOUNTER — Telehealth: Payer: Self-pay | Admitting: *Deleted

## 2011-04-18 MED ORDER — SIMVASTATIN 40 MG PO TABS: 40.0000 mg | ORAL_TABLET | Freq: Every day | ORAL | Status: DC

## 2011-04-18 NOTE — Telephone Encounter (Signed)
Pt called to give [place] the pharmacy number for the Simvastatin.  [phone]  She only needs 4-5 pills.

## 2011-04-18 NOTE — Telephone Encounter (Signed)
Pt left Simvastatin in [place] and needs it to be called to [place].  She will give Selena Batten the number when she calls her back.

## 2011-04-18 NOTE — Telephone Encounter (Signed)
Pt called back again to make sure Selena Batten would get her meds called in.  Thanks.

## 2011-05-05 ENCOUNTER — Ambulatory Visit (HOSPITAL_COMMUNITY)
Admission: RE | Admit: 2011-05-05 | Discharge: 2011-05-05 | Disposition: A | Discharge status: Home / Self Care | Payer: Medicare Other | Source: Ambulatory Visit | Attending: Internal Medicine | Admitting: Internal Medicine

## 2011-05-05 DIAGNOSIS — Z1231 Encounter for screening mammogram for malignant neoplasm of breast: Secondary | ICD-10-CM

## 2011-05-06 ENCOUNTER — Other Ambulatory Visit: Payer: Self-pay | Admitting: Internal Medicine

## 2011-05-06 DIAGNOSIS — Z1231 Encounter for screening mammogram for malignant neoplasm of breast: Secondary | ICD-10-CM

## 2011-06-10 ENCOUNTER — Other Ambulatory Visit: Payer: Self-pay

## 2011-06-10 MED ORDER — SIMVASTATIN 40 MG PO TABS: 40.0000 mg | ORAL_TABLET | Freq: Every day | ORAL | Status: DC

## 2011-06-11 ENCOUNTER — Ambulatory Visit (HOSPITAL_COMMUNITY): Payer: Medicare Other

## 2011-06-12 DIAGNOSIS — F3173 Bipolar disorder, in partial remission, most recent episode manic: Secondary | ICD-10-CM | POA: Diagnosis not present

## 2011-07-23 DIAGNOSIS — F3173 Bipolar disorder, in partial remission, most recent episode manic: Secondary | ICD-10-CM | POA: Diagnosis not present

## 2011-08-06 DIAGNOSIS — E559 Vitamin D deficiency, unspecified: Secondary | ICD-10-CM | POA: Diagnosis not present

## 2011-09-02 DIAGNOSIS — F3173 Bipolar disorder, in partial remission, most recent episode manic: Secondary | ICD-10-CM | POA: Diagnosis not present

## 2011-10-09 DIAGNOSIS — F3173 Bipolar disorder, in partial remission, most recent episode manic: Secondary | ICD-10-CM | POA: Diagnosis not present

## 2011-11-12 DIAGNOSIS — F3173 Bipolar disorder, in partial remission, most recent episode manic: Secondary | ICD-10-CM | POA: Diagnosis not present

## 2011-11-19 DIAGNOSIS — D045 Carcinoma in situ of skin of trunk: Secondary | ICD-10-CM | POA: Diagnosis not present

## 2011-12-24 ENCOUNTER — Ambulatory Visit (INDEPENDENT_AMBULATORY_CARE_PROVIDER_SITE_OTHER): Payer: Medicare Other | Admitting: Internal Medicine

## 2011-12-24 ENCOUNTER — Encounter: Payer: Self-pay | Admitting: Internal Medicine

## 2011-12-24 VITALS — BP 110/70 | HR 64 | Temp 98.3°F | Resp 18 | Ht 67.5 in | Wt 192.0 lb

## 2011-12-24 DIAGNOSIS — Z86010 Personal history of colonic polyps: Secondary | ICD-10-CM | POA: Diagnosis not present

## 2011-12-24 DIAGNOSIS — D649 Anemia, unspecified: Secondary | ICD-10-CM | POA: Diagnosis not present

## 2011-12-24 DIAGNOSIS — Z0000 Encounter for general adult medical examination without abnormal findings: Secondary | ICD-10-CM | POA: Diagnosis not present

## 2011-12-24 DIAGNOSIS — E785 Hyperlipidemia, unspecified: Secondary | ICD-10-CM

## 2011-12-24 DIAGNOSIS — E039 Hypothyroidism, unspecified: Secondary | ICD-10-CM

## 2011-12-24 DIAGNOSIS — E739 Lactose intolerance, unspecified: Secondary | ICD-10-CM | POA: Diagnosis not present

## 2011-12-24 DIAGNOSIS — R748 Abnormal levels of other serum enzymes: Secondary | ICD-10-CM

## 2011-12-24 LAB — COMPREHENSIVE METABOLIC PANEL
ALT: 17 U/L (ref 0–35)
CO2: 29 mEq/L (ref 19–32)
Calcium: 8.8 mg/dL (ref 8.4–10.5)
Chloride: 105 mEq/L (ref 96–112)
GFR: 55.99 mL/min — ABNORMAL LOW (ref >60.00)
Sodium: 142 mEq/L (ref 135–145)
Total Protein: 6.4 g/dL (ref 6.0–8.3)

## 2011-12-24 LAB — LIPID PANEL
Cholesterol: 192 mg/dL (ref 0–200)
LDL Cholesterol: 100 mg/dL — ABNORMAL HIGH (ref 0–99)
Triglycerides: 97 mg/dL (ref 0.0–149.0)
VLDL: 19.4 mg/dL (ref 0.0–40.0)

## 2011-12-24 LAB — POCT URINALYSIS DIPSTICK
Nitrite, UA: NEGATIVE
pH, UA: 7

## 2011-12-24 LAB — CBC WITH DIFFERENTIAL/PLATELET
Basophils Absolute: 0 10*3/uL (ref 0.0–0.1)
Lymphocytes Relative: 42.5 % (ref 12.0–46.0)
Lymphs Abs: 1.6 10*3/uL (ref 0.7–4.0)
Monocytes Relative: 10.3 % (ref 3.0–12.0)
Platelets: 212 10*3/uL (ref 150.0–400.0)
RDW: 14.2 % (ref 11.5–14.6)

## 2011-12-24 LAB — TSH: TSH: 3.61 u[IU]/mL (ref 0.35–5.50)

## 2011-12-24 MED ORDER — SIMVASTATIN 40 MG PO TABS: 40.0000 mg | ORAL_TABLET | Freq: Every day | ORAL | Status: DC

## 2011-12-24 MED ORDER — SERTRALINE HCL 100 MG PO TABS: 100.0000 mg | ORAL_TABLET | Freq: Every day | ORAL | Status: DC

## 2011-12-24 MED ORDER — ZOLPIDEM TARTRATE 10 MG PO TABS: 10.0000 mg | ORAL_TABLET | Freq: Every evening | ORAL | Status: DC | PRN

## 2011-12-24 MED ORDER — LEVOTHYROXINE SODIUM 75 MCG PO TABS: 75.0000 ug | ORAL_TABLET | Freq: Every day | ORAL | Status: DC

## 2011-12-24 NOTE — Progress Notes (Signed)
  Subjective:    Patient ID: Rebecca Novak, female    DOB: Sep 10, 1945, 66 y.o.   MRN: [id number]  HPI  Wt Readings from Last 3 Encounters:  12/24/11 192 lb (87.091 kg)  12/18/10 198 lb (89.812 kg)  06/04/10 203 lb (92.08 kg)    Review of Systems     Objective:   Physical Exam        Assessment & Plan:

## 2011-12-24 NOTE — Progress Notes (Signed)
Patient ID: Rebecca Novak, female   DOB: August 01, 1945, 66 y.o.   MRN: [id number]  Subjective:    Patient ID: Rebecca Novak, female    DOB: 10/31/1945, 66 y.o.   MRN: [id number]  HPI History of Present Illness:   66  year old patient who is seen today for an annual comprehensive evaluation. Medical problems include an impaired glucose tolerance. She has history of colonic polyps and had a colonoscopy in March of last year. She has treated hypothyroidism and dyslipidemia. She is followed by psychiatry for bipolar depression.   Here for Medicare AWV:   1. Risk factors based on Past M, S, F history: risk factors include a history of  tobacco use; discontinued tobacco in October of 2011, and dyslipidemia. She also has a history of colonic polyps  2. Physical Activities: walks and swims without exercise limitation  3. Depression/mood: history bipolar depression, followed by psychiatry  4. Hearing: no deficits  5. ADL's: completely independent in all aspects of daily living  6. Fall Risk: low  7. Home Safety: no problems identified  8. Height, weight, &visual acuity: height and weight are stable. No difficulty with visual acuity  9. Counseling: smoking cessation discussed  10. Labs ordered based on risk factors: laboratory profile, including TSH, and lipid profile will be reviewed  11. Referral Coordination- will continue gynecologic and psychiatric follow-up  12. Care Plan- heart healthy diet more regular exercise. Encouraged will need follow-up colonoscopy in 4 years  13. Cognitive Assessment- alert and oriented, with normal affect   Preventive Screening-Counseling & Management  Alcohol-Tobacco  Smoking Status: Discontinued approximately 3 years ago he    Allergies (verified):  No Known Drug Allergies   Past History:  Past Medical History:   Reviewed history from 11/08/2008 and no changes required.  Hyperlipidemia  Hypothyroidism  ongoing tobacco use  bipolar depression    Colonic polyps, hx of  glucose intolerance    Past Surgical History:  Appendectomy  Hysterectomy Age 56  colonoscopy 07-2008  2-D echocardiogram : January 2009   Family History:  Reviewed history from 11/08/2008 and no changes required.  mother died age 12. History dementia  father Age 17 ;  One brother history schizophrenia. Two sisters positive for depression; one died recently died spring 2013 due to apparent pneumonia  Social History:  Reviewed history from 05/17/2007 and no changes required.  Single  Smoking Status: current   Past Medical History  Diagnosis Date  . ANEMIA, MILD 05/17/2008  . COLONIC POLYPS, HX OF 05/17/2007  . GLUCOSE INTOLERANCE 11/23/2007  . HAND PAIN 07/31/2008  . HYPERLIPIDEMIA 02/05/2007  . HYPOTHYROIDISM 02/05/2007  . SYSTOLIC MURMUR 05/17/2007  . Depression     bipolar    History   Social History  . Marital Status: Married    Spouse Name: N/A    Number of Children: N/A  . Years of Education: N/A   Occupational History  . Not on file.   Social History Main Topics  . Smoking status: Never Smoker   . Smokeless tobacco: Never Used  . Alcohol Use: No  . Drug Use: No  . Sexually Active: Not on file   Other Topics Concern  . Not on file   Social History Narrative  . No narrative on file    Past Surgical History  Procedure Date  . Appendectomy   . Abdominal hysterectomy     No family history on file.  No Known Allergies  Current Outpatient Prescriptions on File Prior  to Visit  Medication Sig Dispense Refill  . aspirin 81 MG tablet Take 81 mg by mouth daily.        . Calcium Carbonate (CALCIUM 500 PO) Take 2 tablets by mouth daily.        . Carbamazepine, Antipsychotic, (EQUETRO) 300 MG CP12 Take 4 capsules by mouth daily.        Marland Kitchen levothyroxine (SYNTHROID) 75 MCG tablet Take 1 tablet (75 mcg total) by mouth daily.  90 tablet  6  . Multiple Vitamin (MULTIVITAMIN) tablet Take 1 tablet by mouth daily.        Marland Kitchen perphenazine  (TRILAFON) 2 MG tablet Take 4 mg by mouth 2 (two) times daily.        . QUEtiapine (SEROQUEL) 400 MG tablet Take 1,000 mg by mouth at bedtime.       . simvastatin (ZOCOR) 40 MG tablet Take 1 tablet (40 mg total) by mouth at bedtime.  90 tablet  1  . zolpidem (AMBIEN) 10 MG tablet Take 10 mg by mouth at bedtime as needed.       . sertraline (ZOLOFT) 100 MG tablet Take 100 mg by mouth daily.         BP 110/70  Pulse 64  Temp 98.3 F (36.8 C) (Oral)  Resp 18  Ht 5' 7.5" (1.715 m)  Wt 192 lb (87.091 kg)  BMI 29.63 kg/m2  SpO2 97%     Review of Systems  Constitutional: Negative for fever, appetite change, fatigue and unexpected weight change.  HENT: Negative for hearing loss, ear pain, nosebleeds, congestion, sore throat, mouth sores, trouble swallowing, neck stiffness, dental problem, voice change, sinus pressure and tinnitus.   Eyes: Negative for photophobia, pain, redness and visual disturbance.  Respiratory: Negative for cough, chest tightness and shortness of breath.   Cardiovascular: Negative for chest pain, palpitations and leg swelling.  Gastrointestinal: Negative for nausea, vomiting, abdominal pain, diarrhea, constipation, blood in stool, abdominal distention and rectal pain.  Genitourinary: Negative for dysuria, urgency, frequency, hematuria, flank pain, vaginal bleeding, vaginal discharge, difficulty urinating, genital sores, vaginal pain, menstrual problem and pelvic pain.  Musculoskeletal: Negative for back pain and arthralgias.  Skin: Negative for rash.  Neurological: Negative for dizziness, syncope, speech difficulty, weakness, light-headedness, numbness and headaches.  Hematological: Negative for adenopathy. Does not bruise/bleed easily.  Psychiatric/Behavioral: Negative for suicidal ideas, behavioral problems, self-injury, dysphoric mood and agitation. The patient is not nervous/anxious.    who is     Objective:   Physical Exam  Constitutional: She is oriented to  person, place, and time. She appears well-developed and well-nourished.  HENT:  Head: Normocephalic and atraumatic.  Right Ear: External ear normal.  Left Ear: External ear normal.  Mouth/Throat: Oropharynx is clear and moist.  Eyes: Conjunctivae and EOM are normal.  Neck: Normal range of motion. Neck supple. No JVD present. No thyromegaly present.  Cardiovascular: Normal rate, regular rhythm, normal heart sounds and intact distal pulses.   No murmur heard. Pulmonary/Chest: Effort normal and breath sounds normal. She has no wheezes. She has no rales.  Abdominal: Soft. Bowel sounds are normal. She exhibits no distension and no mass. There is no tenderness. There is no rebound and no guarding.  Musculoskeletal: Normal range of motion. She exhibits no edema and no tenderness.  Neurological: She is alert and oriented to person, place, and time. She has normal reflexes. No cranial nerve deficit. She exhibits normal muscle tone. Coordination normal.  Skin: Skin is warm and dry. No  rash noted.  Psychiatric: She has a normal mood and affect. Her behavior is normal.          Assessment & Plan:    Annual health maintenance History of glucose intolerance. We'll check a hemoglobin A1c Dyslipidemia. We'll check a lipid profile. Continue simvastatin 40 mg daily Colonic  polyps. Redo colonoscopy 2015

## 2011-12-24 NOTE — Patient Instructions (Signed)
Limit your sodium (Salt) intake    It is important that you exercise regularly, at least 20 minutes 3 to 4 times per week.  If you develop chest pain or shortness of breath seek  medical attention.  You need to lose weight.  Consider a lower calorie diet and regular exercise.  Return in one year for follow-up 

## 2011-12-25 DIAGNOSIS — F3173 Bipolar disorder, in partial remission, most recent episode manic: Secondary | ICD-10-CM | POA: Diagnosis not present

## 2011-12-25 LAB — HEPATITIS C ANTIBODY: HCV Ab: NEGATIVE

## 2011-12-25 NOTE — Progress Notes (Signed)
Quick Note:  Attempt to call - phone problems - letter sent to home address ______

## 2012-01-04 ENCOUNTER — Emergency Department (INDEPENDENT_AMBULATORY_CARE_PROVIDER_SITE_OTHER): Payer: Medicare Other

## 2012-01-04 ENCOUNTER — Encounter (HOSPITAL_COMMUNITY): Payer: Self-pay | Admitting: Emergency Medicine

## 2012-01-04 ENCOUNTER — Emergency Department (INDEPENDENT_AMBULATORY_CARE_PROVIDER_SITE_OTHER)
Admission: EM | Admit: 2012-01-04 | Discharge: 2012-01-04 | Disposition: A | Discharge status: Home / Self Care | Payer: Medicare Other | Source: Home / Self Care | Attending: Emergency Medicine | Admitting: Emergency Medicine

## 2012-01-04 DIAGNOSIS — Z23 Encounter for immunization: Secondary | ICD-10-CM | POA: Diagnosis not present

## 2012-01-04 DIAGNOSIS — IMO0002 Reserved for concepts with insufficient information to code with codable children: Secondary | ICD-10-CM

## 2012-01-04 DIAGNOSIS — M7730 Calcaneal spur, unspecified foot: Secondary | ICD-10-CM | POA: Diagnosis not present

## 2012-01-04 DIAGNOSIS — S90859A Superficial foreign body, unspecified foot, initial encounter: Secondary | ICD-10-CM

## 2012-01-04 DIAGNOSIS — S91309A Unspecified open wound, unspecified foot, initial encounter: Secondary | ICD-10-CM | POA: Diagnosis not present

## 2012-01-04 DIAGNOSIS — M7989 Other specified soft tissue disorders: Secondary | ICD-10-CM | POA: Diagnosis not present

## 2012-01-04 MED ORDER — TETANUS-DIPHTH-ACELL PERTUSSIS 5-2.5-18.5 LF-MCG/0.5 IM SUSP
0.5000 mL | Freq: Once | INTRAMUSCULAR | Status: AC
  Administered 2012-01-04: 0.5 mL via INTRAMUSCULAR

## 2012-01-04 MED ORDER — TETANUS-DIPHTH-ACELL PERTUSSIS 5-2.5-18.5 LF-MCG/0.5 IM SUSP
INTRAMUSCULAR | Status: AC
  Filled 2012-01-04: qty 0.5

## 2012-01-04 MED ORDER — AMOXICILLIN 875 MG PO TABS: 875.0000 mg | ORAL_TABLET | Freq: Two times a day (BID) | ORAL | Status: AC

## 2012-01-04 MED ORDER — LIDOCAINE-EPINEPHRINE 2 %-1:100000 IJ SOLN
5.0000 mL | Freq: Once | INTRAMUSCULAR | Status: AC
  Administered 2012-01-04: 5 mL

## 2012-01-04 NOTE — ED Provider Notes (Addendum)
History     CSN: [id number]  Arrival date & time 01/04/12  1819   First MD Initiated Contact with Patient 01/04/12 1856      Chief Complaint  Patient presents with  . Foot Pain    (Consider location/radiation/quality/duration/timing/severity/associated sxs/prior treatment) HPI Comments: Patient states she was barefoot on the floor, and stepped on an unknown object 10 days ago. States that she has been unable to find any retained foreign body. Presents today with increasing pain, swelling, erythema. Has been soaking it, doing local wound care, applying bacitracin without relief. She states that she has been swimming in a pool since then.  No numbness, tingling, weakness, erythema streaking up her foot, fevers. She is not a smoker or diabetic. Tetanus unknown.  ROS as noted in HPI. All other ROS negative.   Patient is a 66 y.o. female presenting with lower extremity pain. The history is provided by the patient. No language interpreter was used.  Foot Pain This is a new problem. The current episode started more than 1 week ago. The problem occurs constantly. The problem has been gradually worsening. The symptoms are aggravated by walking. Nothing relieves the symptoms. She has tried a warm compress for the symptoms. The treatment provided no relief.    Past Medical History  Diagnosis Date  . ANEMIA, MILD 05/17/2008  . COLONIC POLYPS, HX OF 05/17/2007  . GLUCOSE INTOLERANCE 11/23/2007  . HAND PAIN 07/31/2008  . HYPERLIPIDEMIA 02/05/2007  . HYPOTHYROIDISM 02/05/2007  . SYSTOLIC MURMUR 05/17/2007  . Depression     bipolar    Past Surgical History  Procedure Date  . Appendectomy   . Abdominal hysterectomy     History reviewed. No pertinent family history.  History  Substance Use Topics  . Smoking status: Never Smoker   . Smokeless tobacco: Never Used  . Alcohol Use: No    OB History    Grav Para Term Preterm Abortions TAB SAB Ect Mult Living                  Review of  Systems  Allergies  Review of patient's allergies indicates no known allergies.  Home Medications   Current Outpatient Rx  Name Route Sig Dispense Refill  . AMOXICILLIN 875 MG PO TABS Oral Take 1 tablet (875 mg total) by mouth 2 (two) times daily. X 10 days 20 tablet 0  . ASPIRIN 81 MG PO TABS Oral Take 81 mg by mouth daily.      Marland Kitchen CALCIUM 500 PO Oral Take 2 tablets by mouth daily.      Marland Kitchen CARBAMAZEPINE ER (ANTIPSYCH) 300 MG PO CP12 Oral Take 4 capsules by mouth daily.      Marland Kitchen LEVOTHYROXINE SODIUM 75 MCG PO TABS Oral Take 1 tablet (75 mcg total) by mouth daily. 90 tablet 6  . ONE-DAILY MULTI VITAMINS PO TABS Oral Take 1 tablet by mouth daily.      Marland Kitchen PERPHENAZINE 2 MG PO TABS Oral Take 4 mg by mouth 2 (two) times daily.      . QUETIAPINE FUMARATE 400 MG PO TABS Oral Take 1,000 mg by mouth at bedtime.     . SF 5000 PLUS 1.1 % DT CREA      . SIMVASTATIN 40 MG PO TABS Oral Take 1 tablet (40 mg total) by mouth at bedtime. 90 tablet 1  . ZOLPIDEM TARTRATE 10 MG PO TABS Oral Take 1 tablet (10 mg total) by mouth at bedtime as needed. 30 tablet 3  BP 170/83  Pulse 71  Temp 99.2 F (37.3 C) (Oral)  Resp 18  SpO2 96%  Physical Exam  Nursing note and vitals reviewed. Constitutional: She is oriented to person, place, and time. She appears well-developed and well-nourished. No distress.  HENT:  Head: Normocephalic and atraumatic.  Eyes: Conjunctivae and EOM are normal.  Neck: Normal range of motion.  Cardiovascular: Normal rate.   Pulmonary/Chest: Effort normal.  Abdominal: She exhibits no distension.  Musculoskeletal: Normal range of motion.       Puncture wound with expressible purulent drainage on plantar aspect of left foot between first and second metatarsals. Localized tenderness. Surrounding erythema, increased temperature. Refill 2 seconds. DP 2+. No bony tenderness.  Neurological: She is alert and oriented to person, place, and time. Coordination normal.  Skin: Skin is warm and  dry.  Psychiatric: She has a normal mood and affect. Her behavior is normal. Judgment and thought content normal.    ED Course  FOREIGN BODY REMOVAL Date/Time: 01/04/2012 8:35 PM Performed by: Luiz Blare Authorized by: Luiz Blare Consent: Verbal consent obtained. Risks and benefits: risks, benefits and alternatives were discussed Consent given by: patient Patient understanding: patient states understanding of the procedure being performed Patient consent: the patient's understanding of the procedure matches consent given Procedure consent: procedure consent matches procedure scheduled Imaging studies: imaging studies available Required items: required blood products, implants, devices, and special equipment available Patient identity confirmed: verbally with patient Time out: Immediately prior to procedure a "time out" was called to verify the correct patient, procedure, equipment, support staff and site/side marked as required. Intake: left foot. Anesthesia: local infiltration Local anesthetic: lidocaine 2% with epinephrine Anesthetic total: 1 ml Patient sedated: no Patient restrained: no Complexity: simple 1 objects recovered. Objects recovered: 11 mm glass shard Post-procedure assessment: foreign body removed Patient tolerance: Patient tolerated the procedure well with no immediate complications. Comments: Irrigated the wound with remainder of lidocaine with epi, 4 mL. Explored the wound, no apparent foreign body retained. scant purulent drainage, sent this off for culture.   (including critical care time)   Labs Reviewed  CULTURE, ROUTINE-ABSCESS   Dg Foot Complete Left  01/04/2012  *RADIOLOGY REPORT*  Clinical Data: Puncture wound between first and second MTP joints. Continued pain.  Swelling and erythema.  LEFT FOOT - COMPLETE 3+ VIEW  Comparison: None.  Findings: There is a vertically oriented 9 mm shard imbedded in the soft tissues of the ball of the  foot in the vicinity of the first and second metatarsophalangeal joints.  This is best appreciated on the lateral projection.  Slight deformity of the distal head of the proximal phalanx of the small toe is probably chronic.  No malalignment at the Lisfranc joint is observed.  No fracture or acute bony findings noted. Plantar and Achilles calcaneal spurs are present.  IMPRESSION:  1.  9 mm shard / foreign body imbedded in the soft tissues in the vicinity of the described puncture wound, probably a shard of glass or similar material. 2.  Plantar and Achilles calcaneal spurs.  Original Report Authenticated By: Dellia Cloud, M.D.     1. Foreign body in foot      MDM   Imaging reviewed by myself. Report per radiologist.  Removed FB in its entirety. Wound cx pending. Apply bacitracin, sterile pressure dressing. Will send home with pain medications as needed. Discussed case with Dr. Charlann Boxer, will send home on amoxicillin bid x 10 days, she will f/u with him  in 5-6 days.   Luiz Blare, MD 01/04/12 2107  Luiz Blare, MD 01/04/12 2108

## 2012-01-04 NOTE — ED Notes (Signed)
Pt has had pain in lt foot since 12-25-11. Pt was cleaning up and stepped on something, but didn't see anything at the time of the incident.

## 2012-01-07 ENCOUNTER — Telehealth (HOSPITAL_COMMUNITY): Payer: Self-pay | Admitting: *Deleted

## 2012-01-07 DIAGNOSIS — L923 Foreign body granuloma of the skin and subcutaneous tissue: Secondary | ICD-10-CM | POA: Diagnosis not present

## 2012-01-07 LAB — CULTURE, ROUTINE-ABSCESS: Gram Stain: NONE SEEN

## 2012-01-07 MED ORDER — SULFAMETHOXAZOLE-TRIMETHOPRIM 800-160 MG PO TABS: 1.0000 | ORAL_TABLET | Freq: Two times a day (BID) | ORAL | Status: AC

## 2012-01-07 NOTE — ED Notes (Signed)
Abscess culture L foot: Mod. Staph. Aureus.  Pt. treated with Amoxicillin 875 mg. -it is resistant to PCN. Lab shown to Dr. Chaney Malling and order changed to Septra DS. I called pt. and left a message to call. Vassie Moselle 01/07/2012

## 2012-01-08 ENCOUNTER — Telehealth (HOSPITAL_COMMUNITY): Payer: Self-pay | Admitting: *Deleted

## 2012-01-08 NOTE — ED Notes (Signed)
Pt. called back and said she got Septra DS and thought she was supposed to get Amoxicillin. I told her no, she was on Amoxicillin and we switched her to Septra DS.  She said she did not know that was what she was on.  She asked if she needs to f/u. I told her only if it is not better after she finishes her antibiotics, then she would f/u with the orthopedist.  I told her she should be OK because you told me the PA looked at it yesterday and said it looked good.  Pt. Reassured. Vassie Moselle 01/08/2012

## 2012-01-08 NOTE — ED Notes (Signed)
I called pt.  Pt. verified x 2 and given results.  Pt. told to stop the Amoxicillin because the bacteria is resistant to it and take the Septra DS 2 times per day for 10 days. Pt. wants Rx. called to [facility] on [address]. Rx. called to pharmacist @ [phone].  Pt. states she has seen Dr. Nilsa Nutting PA and he said it looked good. She has another f/u with Dr. Charlann Boxer. I told her she can let him know about Rx. change.                    Vassie Moselle 01/08/2012

## 2012-01-21 DIAGNOSIS — F3173 Bipolar disorder, in partial remission, most recent episode manic: Secondary | ICD-10-CM | POA: Diagnosis not present

## 2012-02-11 DIAGNOSIS — H1045 Other chronic allergic conjunctivitis: Secondary | ICD-10-CM | POA: Diagnosis not present

## 2012-02-11 DIAGNOSIS — H04129 Dry eye syndrome of unspecified lacrimal gland: Secondary | ICD-10-CM | POA: Diagnosis not present

## 2012-02-11 DIAGNOSIS — H25019 Cortical age-related cataract, unspecified eye: Secondary | ICD-10-CM | POA: Diagnosis not present

## 2012-02-11 DIAGNOSIS — G529 Cranial nerve disorder, unspecified: Secondary | ICD-10-CM | POA: Diagnosis not present

## 2012-02-13 ENCOUNTER — Telehealth: Payer: Self-pay

## 2012-02-13 NOTE — Telephone Encounter (Signed)
recv'd letter from pt requesting to zoloft be removed from her med list - this is not a med she takes - per pt and dr. Jennelle Human  Med list checked - it has been removed

## 2012-02-19 ENCOUNTER — Other Ambulatory Visit: Payer: Self-pay | Admitting: Internal Medicine

## 2012-02-23 ENCOUNTER — Other Ambulatory Visit: Payer: Self-pay | Admitting: Internal Medicine

## 2012-02-23 DIAGNOSIS — Z1231 Encounter for screening mammogram for malignant neoplasm of breast: Secondary | ICD-10-CM

## 2012-03-16 ENCOUNTER — Ambulatory Visit (INDEPENDENT_AMBULATORY_CARE_PROVIDER_SITE_OTHER): Payer: Medicare Other

## 2012-03-16 DIAGNOSIS — Z23 Encounter for immunization: Secondary | ICD-10-CM

## 2012-03-17 DIAGNOSIS — F3173 Bipolar disorder, in partial remission, most recent episode manic: Secondary | ICD-10-CM | POA: Diagnosis not present

## 2012-04-01 DIAGNOSIS — Z79899 Other long term (current) drug therapy: Secondary | ICD-10-CM | POA: Diagnosis not present

## 2012-04-01 DIAGNOSIS — F3112 Bipolar disorder, current episode manic without psychotic features, moderate: Secondary | ICD-10-CM | POA: Diagnosis not present

## 2012-04-01 DIAGNOSIS — R413 Other amnesia: Secondary | ICD-10-CM | POA: Diagnosis not present

## 2012-04-07 ENCOUNTER — Ambulatory Visit (HOSPITAL_COMMUNITY)
Admission: RE | Admit: 2012-04-07 | Discharge: 2012-04-07 | Disposition: A | Discharge status: Home / Self Care | Payer: Medicare Other | Source: Ambulatory Visit | Attending: Internal Medicine | Admitting: Internal Medicine

## 2012-04-07 DIAGNOSIS — Z1231 Encounter for screening mammogram for malignant neoplasm of breast: Secondary | ICD-10-CM | POA: Insufficient documentation

## 2012-04-14 DIAGNOSIS — F3173 Bipolar disorder, in partial remission, most recent episode manic: Secondary | ICD-10-CM | POA: Diagnosis not present

## 2012-05-12 DIAGNOSIS — Z01419 Encounter for gynecological examination (general) (routine) without abnormal findings: Secondary | ICD-10-CM | POA: Diagnosis not present

## 2012-05-12 DIAGNOSIS — E559 Vitamin D deficiency, unspecified: Secondary | ICD-10-CM | POA: Diagnosis not present

## 2012-05-12 DIAGNOSIS — Z124 Encounter for screening for malignant neoplasm of cervix: Secondary | ICD-10-CM | POA: Diagnosis not present

## 2012-05-20 ENCOUNTER — Other Ambulatory Visit (HOSPITAL_COMMUNITY): Payer: Self-pay | Admitting: Obstetrics

## 2012-05-20 DIAGNOSIS — Z780 Asymptomatic menopausal state: Secondary | ICD-10-CM

## 2012-06-02 DIAGNOSIS — F3173 Bipolar disorder, in partial remission, most recent episode manic: Secondary | ICD-10-CM | POA: Diagnosis not present

## 2012-07-14 DIAGNOSIS — F3173 Bipolar disorder, in partial remission, most recent episode manic: Secondary | ICD-10-CM | POA: Diagnosis not present

## 2012-08-25 DIAGNOSIS — F3173 Bipolar disorder, in partial remission, most recent episode manic: Secondary | ICD-10-CM | POA: Diagnosis not present

## 2012-10-04 ENCOUNTER — Ambulatory Visit: Payer: Medicare Other | Admitting: Internal Medicine

## 2012-10-06 DIAGNOSIS — F3173 Bipolar disorder, in partial remission, most recent episode manic: Secondary | ICD-10-CM | POA: Diagnosis not present

## 2012-10-11 ENCOUNTER — Telehealth: Payer: Self-pay | Admitting: Internal Medicine

## 2012-10-11 NOTE — Telephone Encounter (Signed)
Okay to discontinue-will discuss at next return office visit

## 2012-10-11 NOTE — Telephone Encounter (Signed)
Patient Information:  Caller Name: Arantza  Phone: [phone]  Patient: Rebecca Novak, Rebecca Novak  Gender: Female  DOB: 1946-01-09  Age: 67 Years  PCP: Eleonore Chiquito [facility])  Office Follow Up:  Does the office need to follow up with this patient?: Yes  Instructions For The Office: Please see note regarding need for Aspirin   Symptoms  Reason For Call & Symptoms: Pt is taking Aspirin 80mg  daily.   Saw on tv where this may be controversial and would like to know if Dr Amador Cunas would recommend pt continuing to take medication.  Reviewed Health History In EMR: Yes  Reviewed Medications In EMR: Yes  Reviewed Allergies In EMR: Yes  Reviewed Surgeries / Procedures: Yes  Date of Onset of Symptoms: 10/11/2012  Guideline(s) Used:  No Protocol Available - Information Only  Disposition Per Guideline:   Discuss with PCP and Callback by Nurse Today  Reason For Disposition Reached:   Nursing judgment  Advice Given:  Call Back If:  New symptoms develop  You become worse.  Patient Will Follow Care Advice:  YES

## 2012-10-12 NOTE — Telephone Encounter (Signed)
Spoke to pt told her okay to discontinue-will discuss at next return office visit. Pt verbalized understanding.

## 2012-12-08 DIAGNOSIS — C44711 Basal cell carcinoma of skin of unspecified lower limb, including hip: Secondary | ICD-10-CM | POA: Diagnosis not present

## 2012-12-08 DIAGNOSIS — D045 Carcinoma in situ of skin of trunk: Secondary | ICD-10-CM | POA: Diagnosis not present

## 2012-12-10 ENCOUNTER — Other Ambulatory Visit: Payer: Self-pay | Admitting: Internal Medicine

## 2012-12-10 DIAGNOSIS — D649 Anemia, unspecified: Secondary | ICD-10-CM

## 2012-12-10 DIAGNOSIS — E739 Lactose intolerance, unspecified: Secondary | ICD-10-CM

## 2012-12-10 DIAGNOSIS — E785 Hyperlipidemia, unspecified: Secondary | ICD-10-CM

## 2012-12-10 DIAGNOSIS — E039 Hypothyroidism, unspecified: Secondary | ICD-10-CM

## 2012-12-15 ENCOUNTER — Other Ambulatory Visit (INDEPENDENT_AMBULATORY_CARE_PROVIDER_SITE_OTHER): Payer: Medicare Other

## 2012-12-15 ENCOUNTER — Other Ambulatory Visit: Payer: Medicare Other

## 2012-12-15 DIAGNOSIS — D649 Anemia, unspecified: Secondary | ICD-10-CM

## 2012-12-15 DIAGNOSIS — E039 Hypothyroidism, unspecified: Secondary | ICD-10-CM

## 2012-12-15 DIAGNOSIS — E739 Lactose intolerance, unspecified: Secondary | ICD-10-CM | POA: Diagnosis not present

## 2012-12-15 DIAGNOSIS — F3173 Bipolar disorder, in partial remission, most recent episode manic: Secondary | ICD-10-CM | POA: Diagnosis not present

## 2012-12-15 DIAGNOSIS — E785 Hyperlipidemia, unspecified: Secondary | ICD-10-CM | POA: Diagnosis not present

## 2012-12-15 LAB — POCT URINALYSIS DIPSTICK
Ketones, UA: NEGATIVE
Protein, UA: NEGATIVE
Spec Grav, UA: 1.015
pH, UA: 8.5

## 2012-12-15 LAB — HEPATIC FUNCTION PANEL
AST: 26 U/L (ref 0–37)
Bilirubin, Direct: 0 mg/dL (ref 0.0–0.3)
Total Bilirubin: 0.5 mg/dL (ref 0.3–1.2)

## 2012-12-15 LAB — LIPID PANEL
HDL: 74 mg/dL (ref >39.00)
Total CHOL/HDL Ratio: 3

## 2012-12-15 LAB — CBC
HCT: 36.3 % (ref 36.0–46.0)
Hemoglobin: 12.2 g/dL (ref 12.0–15.0)
RBC: 4.09 Mil/uL (ref 3.87–5.11)
RDW: 14.6 % (ref 11.5–14.6)
WBC: 4.8 10*3/uL (ref 4.5–10.5)

## 2012-12-15 LAB — BASIC METABOLIC PANEL
CO2: 26 mEq/L (ref 19–32)
Calcium: 8.8 mg/dL (ref 8.4–10.5)
Creatinine, Ser: 1 mg/dL (ref 0.4–1.2)
Glucose, Bld: 107 mg/dL — ABNORMAL HIGH (ref 70–99)

## 2012-12-22 ENCOUNTER — Encounter: Payer: Self-pay | Admitting: Internal Medicine

## 2012-12-22 ENCOUNTER — Ambulatory Visit (INDEPENDENT_AMBULATORY_CARE_PROVIDER_SITE_OTHER): Payer: Medicare Other | Admitting: Internal Medicine

## 2012-12-22 VITALS — BP 140/80 | HR 84 | Temp 98.6°F | Resp 20 | Wt 198.0 lb

## 2012-12-22 DIAGNOSIS — M79609 Pain in unspecified limb: Secondary | ICD-10-CM

## 2012-12-22 DIAGNOSIS — Z0000 Encounter for general adult medical examination without abnormal findings: Secondary | ICD-10-CM

## 2012-12-22 DIAGNOSIS — E739 Lactose intolerance, unspecified: Secondary | ICD-10-CM | POA: Diagnosis not present

## 2012-12-22 DIAGNOSIS — E785 Hyperlipidemia, unspecified: Secondary | ICD-10-CM | POA: Diagnosis not present

## 2012-12-22 DIAGNOSIS — Z86010 Personal history of colonic polyps: Secondary | ICD-10-CM | POA: Diagnosis not present

## 2012-12-22 DIAGNOSIS — E039 Hypothyroidism, unspecified: Secondary | ICD-10-CM

## 2012-12-22 DIAGNOSIS — D649 Anemia, unspecified: Secondary | ICD-10-CM

## 2012-12-22 MED ORDER — SIMVASTATIN 40 MG PO TABS
40.0000 mg | ORAL_TABLET | Freq: Every day | ORAL | Status: DC
Start: 2012-12-22 — End: 2013-08-24

## 2012-12-22 MED ORDER — LEVOTHYROXINE SODIUM 75 MCG PO TABS: 75.0000 ug | ORAL_TABLET | Freq: Every day | ORAL | Status: DC

## 2012-12-22 NOTE — Patient Instructions (Signed)
Limit your sodium (Salt) intake    It is important that you exercise regularly, at least 20 minutes 3 to 4 times per week.  If you develop chest pain or shortness of breath seek  medical attention.  You need to lose weight.  Consider a lower calorie diet and regular exercise.  Return in one year for follow-up 

## 2012-12-22 NOTE — Progress Notes (Signed)
Patient ID: Rebecca Novak, female   DOB: Apr 30, 1946, 67 y.o.   MRN: [id number] Patient ID: Rebecca Novak, female   DOB: 18-Dec-1945, 67 y.o.   MRN: [id number]  Subjective:    Patient ID: Rebecca Novak, female    DOB: [birth year], 67 y.o.   MRN: [id number]  HPI History of Present Illness:   67   year old patient who is seen today for an annual comprehensive evaluation. Medical problems include an impaired glucose tolerance. She has history of colonic polyps and had a colonoscopy in March of  2010. She has treated hypothyroidism and dyslipidemia. She is followed by psychiatry for bipolar depression. Doing quite well. Her only main complaint is some right shoulder pain. This is quite bothersome when she reaches behind her back to fasten her bra. She states that it is better at the present time. Remains active with swimming which she feels has helped the discomfort   Here for Medicare AWV:   1. Risk factors based on Past M, S, F history: risk factors include a history of  tobacco use; discontinued tobacco in October of 2011, and dyslipidemia. She also has a history of colonic polyps  2. Physical Activities: walks and swims without exercise limitation  3. Depression/mood: history bipolar depression, followed by psychiatry  4. Hearing: no deficits  5. ADL's: completely independent in all aspects of daily living  6. Fall Risk: low  7. Home Safety: no problems identified  8. Height, weight, &visual acuity: height and weight are stable. No difficulty with visual acuity  9. Counseling: smoking cessation discussed  10. Labs ordered based on risk factors: laboratory profile, including TSH, and lipid profile will be reviewed  11. Referral Coordination- will continue gynecologic and psychiatric follow-up  12. Care Plan- heart healthy diet more regular exercise. Encouraged will need follow-up colonoscopy in 4 years  13. Cognitive Assessment- alert and oriented, with normal affect   Preventive  Screening-Counseling & Management  Alcohol-Tobacco  Smoking Status: Discontinued approximately 3 years ago he    Allergies (verified):  No Known Drug Allergies   Past History:  Past Medical History:   Reviewed history from 11/08/2008 and no changes required.  Hyperlipidemia  Hypothyroidism  ongoing tobacco use  bipolar depression  Colonic polyps, hx of  glucose intolerance    Past Surgical History:  Appendectomy  Hysterectomy Age 72  colonoscopy 07-2008  2-D echocardiogram : January 2009   Family History:  Reviewed history from 11/08/2008 and no changes required.  mother died age 66. History dementia  father Age 49 ;  One brother history schizophrenia. Two sisters positive for depression; one died recently died spring 2013 due to apparent pneumonia  Social History:  Reviewed history from 05/17/2007 and no changes required.  Single  Smoking Status: current   Past Medical History  Diagnosis Date  . ANEMIA, MILD 05/17/2008  . COLONIC POLYPS, HX OF 05/17/2007  . GLUCOSE INTOLERANCE 11/23/2007  . HAND PAIN 07/31/2008  . HYPERLIPIDEMIA 02/05/2007  . HYPOTHYROIDISM 02/05/2007  . SYSTOLIC MURMUR 05/17/2007  . Depression     bipolar    History   Social History  . Marital Status: Single    Spouse Name: N/A    Number of Children: N/A  . Years of Education: N/A   Occupational History  . Not on file.   Social History Main Topics  . Smoking status: Never Smoker   . Smokeless tobacco: Never Used  . Alcohol Use: No  . Drug Use: No  .  Sexually Active: Not on file   Other Topics Concern  . Not on file   Social History Narrative  . No narrative on file    Past Surgical History  Procedure Laterality Date  . Appendectomy    . Abdominal hysterectomy      No family history on file.  No Known Allergies  Current Outpatient Prescriptions on File Prior to Visit  Medication Sig Dispense Refill  . Calcium Carbonate (CALCIUM 500 PO) Take 2 tablets by mouth daily.         . Carbamazepine, Antipsychotic, (EQUETRO) 300 MG CP12 Take 4 capsules by mouth daily.        Marland Kitchen levothyroxine (SYNTHROID) 75 MCG tablet Take 1 tablet (75 mcg total) by mouth daily.  90 tablet  6  . Multiple Vitamin (MULTIVITAMIN) tablet Take 1 tablet by mouth daily.        Marland Kitchen perphenazine (TRILAFON) 2 MG tablet Take 4 mg by mouth 2 (two) times daily.        . QUEtiapine (SEROQUEL) 400 MG tablet Take 1,000 mg by mouth at bedtime.       . SF 5000 PLUS 1.1 % CREA dental cream       . simvastatin (ZOCOR) 40 MG tablet Take 1 tablet (40 mg total) by mouth at bedtime.  90 tablet  1  . zolpidem (AMBIEN) 10 MG tablet Take 1 tablet (10 mg total) by mouth at bedtime as needed.  30 tablet  3  . aspirin 81 MG tablet Take 81 mg by mouth daily.         No current facility-administered medications on file prior to visit.    BP 140/80  Pulse 84  Temp(Src) 98.6 F (37 C) (Oral)  Resp 20  Wt 198 lb (89.812 kg)  BMI 30.54 kg/m2  SpO2 97%     Review of Systems  Constitutional: Negative for fever, appetite change, fatigue and unexpected weight change.  HENT: Negative for hearing loss, ear pain, nosebleeds, congestion, sore throat, mouth sores, trouble swallowing, neck stiffness, dental problem, voice change, sinus pressure and tinnitus.   Eyes: Negative for photophobia, pain, redness and visual disturbance.  Respiratory: Negative for cough, chest tightness and shortness of breath.   Cardiovascular: Negative for chest pain, palpitations and leg swelling.  Gastrointestinal: Negative for nausea, vomiting, abdominal pain, diarrhea, constipation, blood in stool, abdominal distention and rectal pain.  Genitourinary: Negative for dysuria, urgency, frequency, hematuria, flank pain, vaginal bleeding, vaginal discharge, difficulty urinating, genital sores, vaginal pain, menstrual problem and pelvic pain.  Musculoskeletal: Negative for back pain and arthralgias.  Skin: Negative for rash.  Neurological:  Negative for dizziness, syncope, speech difficulty, weakness, light-headedness, numbness and headaches.  Hematological: Negative for adenopathy. Does not bruise/bleed easily.  Psychiatric/Behavioral: Negative for suicidal ideas, behavioral problems, self-injury, dysphoric mood and agitation. The patient is not nervous/anxious.    who is     Objective:   Physical Exam  Constitutional: She is oriented to person, place, and time. She appears well-developed and well-nourished.  HENT:  Head: Normocephalic and atraumatic.  Right Ear: External ear normal.  Left Ear: External ear normal.  Mouth/Throat: Oropharynx is clear and moist.  Eyes: Conjunctivae and EOM are normal.  Neck: Normal range of motion. Neck supple. No JVD present. No thyromegaly present.  Cardiovascular: Normal rate, regular rhythm, normal heart sounds and intact distal pulses.   No murmur heard. Pulmonary/Chest: Effort normal and breath sounds normal. She has no wheezes. She has no rales.  Abdominal:  Soft. Bowel sounds are normal. She exhibits no distension and no mass. There is no tenderness. There is no rebound and no guarding.  Musculoskeletal: Normal range of motion. She exhibits no edema and no tenderness.  Neurological: She is alert and oriented to person, place, and time. She has normal reflexes. No cranial nerve deficit. She exhibits normal muscle tone. Coordination normal.  Skin: Skin is warm and dry. No rash noted.  Psychiatric: She has a normal mood and affect. Her behavior is normal.          Assessment & Plan:    Annual health maintenance History of glucose intolerance. We'll check a hemoglobin A1c Dyslipidemia. We'll check a lipid profile. Continue simvastatin 40 mg daily Colonic  polyps. Redo colonoscopy 2015

## 2012-12-27 ENCOUNTER — Telehealth: Payer: Self-pay | Admitting: Internal Medicine

## 2012-12-27 NOTE — Telephone Encounter (Signed)
Patient Information:  Caller Name: Rebecca Novak  Phone: [phone]  Patient: Rebecca Novak, Rebecca Novak  Gender: Female  DOB: 1946/04/05  Age: 67 Years  PCP: Rebecca Novak [facility])  Office Follow Up:  Does the office need to follow up with this patient?: Yes  Instructions For The Office: States MD gave her the lab results for Rebecca Novak/11-05/1933 but did not include all of lab results. Was to get 3 pages but only got 2 pages. Would like Rebecca Novak to call her and let her know when she can pick up last page of labs.  RN Note:  Questions answered. Advised to watch carbohydrates as well.  Symptoms  Reason For Call & Symptoms: States MD is "watching" blood sugars. Wants to know if 12 gms of sugar is too much for a  portion.  Reviewed Health History In EMR: Yes  Reviewed Medications In EMR: Yes  Reviewed Allergies In EMR: Yes  Reviewed Surgeries / Procedures: Yes  Date of Onset of Symptoms: 12/27/2012  Guideline(s) Used:  No Protocol Available - Information Only  Disposition Per Guideline:   Home Care  Reason For Disposition Reached:   Information only question and nurse able to answer  Advice Given:  N/A  Patient Will Follow Care Advice:  YES

## 2012-12-29 ENCOUNTER — Encounter: Payer: Medicare Other | Admitting: Internal Medicine

## 2013-01-05 ENCOUNTER — Other Ambulatory Visit: Payer: Medicare Other

## 2013-01-17 DIAGNOSIS — F3173 Bipolar disorder, in partial remission, most recent episode manic: Secondary | ICD-10-CM | POA: Diagnosis not present

## 2013-01-20 ENCOUNTER — Other Ambulatory Visit: Payer: Self-pay | Admitting: *Deleted

## 2013-01-20 ENCOUNTER — Other Ambulatory Visit: Payer: Self-pay | Admitting: Internal Medicine

## 2013-02-14 ENCOUNTER — Observation Stay (HOSPITAL_COMMUNITY)
Admission: EM | Admit: 2013-02-14 | Discharge: 2013-02-16 | Disposition: A | Discharge status: Home / Self Care | Payer: Medicare Other | Attending: Internal Medicine | Admitting: Internal Medicine

## 2013-02-14 ENCOUNTER — Emergency Department (HOSPITAL_COMMUNITY): Payer: Medicare Other

## 2013-02-14 ENCOUNTER — Encounter (HOSPITAL_COMMUNITY): Payer: Self-pay | Admitting: Nurse Practitioner

## 2013-02-14 ENCOUNTER — Telehealth: Payer: Self-pay | Admitting: Internal Medicine

## 2013-02-14 DIAGNOSIS — R55 Syncope and collapse: Secondary | ICD-10-CM | POA: Diagnosis not present

## 2013-02-14 DIAGNOSIS — I951 Orthostatic hypotension: Secondary | ICD-10-CM | POA: Diagnosis present

## 2013-02-14 DIAGNOSIS — D649 Anemia, unspecified: Secondary | ICD-10-CM

## 2013-02-14 DIAGNOSIS — R404 Transient alteration of awareness: Secondary | ICD-10-CM | POA: Diagnosis not present

## 2013-02-14 DIAGNOSIS — E039 Hypothyroidism, unspecified: Secondary | ICD-10-CM | POA: Diagnosis not present

## 2013-02-14 DIAGNOSIS — R112 Nausea with vomiting, unspecified: Secondary | ICD-10-CM | POA: Diagnosis present

## 2013-02-14 DIAGNOSIS — R29898 Other symptoms and signs involving the musculoskeletal system: Secondary | ICD-10-CM | POA: Diagnosis not present

## 2013-02-14 DIAGNOSIS — R279 Unspecified lack of coordination: Secondary | ICD-10-CM | POA: Diagnosis not present

## 2013-02-14 DIAGNOSIS — R42 Dizziness and giddiness: Secondary | ICD-10-CM | POA: Insufficient documentation

## 2013-02-14 DIAGNOSIS — Z79899 Other long term (current) drug therapy: Secondary | ICD-10-CM | POA: Insufficient documentation

## 2013-02-14 DIAGNOSIS — E785 Hyperlipidemia, unspecified: Secondary | ICD-10-CM | POA: Diagnosis present

## 2013-02-14 DIAGNOSIS — Z9181 History of falling: Secondary | ICD-10-CM | POA: Diagnosis not present

## 2013-02-14 DIAGNOSIS — R531 Weakness: Secondary | ICD-10-CM

## 2013-02-14 DIAGNOSIS — R4182 Altered mental status, unspecified: Secondary | ICD-10-CM | POA: Insufficient documentation

## 2013-02-14 DIAGNOSIS — R269 Unspecified abnormalities of gait and mobility: Secondary | ICD-10-CM | POA: Diagnosis not present

## 2013-02-14 DIAGNOSIS — R270 Ataxia, unspecified: Secondary | ICD-10-CM

## 2013-02-14 DIAGNOSIS — F319 Bipolar disorder, unspecified: Secondary | ICD-10-CM | POA: Insufficient documentation

## 2013-02-14 DIAGNOSIS — F329 Major depressive disorder, single episode, unspecified: Secondary | ICD-10-CM

## 2013-02-14 DIAGNOSIS — R5381 Other malaise: Secondary | ICD-10-CM | POA: Diagnosis not present

## 2013-02-14 HISTORY — DX: Unspecified malignant neoplasm of skin, unspecified: C44.90

## 2013-02-14 LAB — URINALYSIS, ROUTINE W REFLEX MICROSCOPIC
Hgb urine dipstick: NEGATIVE
Leukocytes, UA: NEGATIVE
Nitrite: NEGATIVE
Specific Gravity, Urine: 1.02 (ref 1.005–1.030)
Urobilinogen, UA: 1 mg/dL (ref 0.0–1.0)

## 2013-02-14 LAB — POCT I-STAT TROPONIN I: Troponin i, poc: 0.01 ng/mL (ref 0.00–0.08)

## 2013-02-14 LAB — CBC WITH DIFFERENTIAL/PLATELET
Basophils Absolute: 0 10*3/uL (ref 0.0–0.1)
Basophils Relative: 0 % (ref 0–1)
Eosinophils Relative: 0 % (ref 0–5)
HCT: 33.5 % — ABNORMAL LOW (ref 36.0–46.0)
MCHC: 34 g/dL (ref 30.0–36.0)
MCV: 86.6 fL (ref 78.0–100.0)
Monocytes Absolute: 0.4 10*3/uL (ref 0.1–1.0)
RDW: 13.9 % (ref 11.5–15.5)

## 2013-02-14 LAB — COMPREHENSIVE METABOLIC PANEL
AST: 61 U/L — ABNORMAL HIGH (ref 0–37)
CO2: 28 mEq/L (ref 19–32)
Calcium: 7.5 mg/dL — ABNORMAL LOW (ref 8.4–10.5)
Creatinine, Ser: 0.78 mg/dL (ref 0.50–1.10)
GFR calc non Af Amer: 83 mL/min — ABNORMAL LOW (ref >90)

## 2013-02-14 LAB — CARBAMAZEPINE LEVEL, TOTAL: Carbamazepine Lvl: 9.5 ug/mL (ref 4.0–12.0)

## 2013-02-14 MED ORDER — SODIUM CHLORIDE 0.9 % IV BOLUS (SEPSIS)
1000.0000 mL | Freq: Once | INTRAVENOUS | Status: AC
  Administered 2013-02-14: 1000 mL via INTRAVENOUS

## 2013-02-14 NOTE — ED Provider Notes (Signed)
CSN: [id number]     Arrival date & time 02/14/13  1631 History   First MD Initiated Contact with Patient 02/14/13 1750     Chief Complaint  Patient presents with  . Dizziness  . Emesis   (Consider location/radiation/quality/duration/timing/severity/associated sxs/prior Treatment) HPI Comments: Patient with history of bipolar on Tegretol, high cholesterol, hypothyroidism -- presents with complaint of generalized weakness and vomiting for the past 2 days. She has been lightheaded at times and fell in her bathroom, possibly with brief loss of consciousness, 2 nights ago. She states she has been having much difficulty walking since yesterday because her balance is off. No vertigo. She is concerned she is 'overmedicated' on her psychiatric medications and states she is taking them exactly as prescribed. She has had several episodes of nonbloody, nonbilious vomiting today. Patient to consult with her primary physician office who recommended that she come to the emergency department. Patient states that she feels very weak. She denies fever, upper respiratory tract infection symptoms, headache, chest pain or shortness of breath, abdominal pain, urinary symptoms, skin rash, lower extremity swelling. No treatments prior to arrival other than Zofran given by EMS. Onset of symptoms gradual. Course is constant.    The history is provided by the patient and medical records.    Past Medical History  Diagnosis Date  . ANEMIA, MILD 05/17/2008  . COLONIC POLYPS, HX OF 05/17/2007  . GLUCOSE INTOLERANCE 11/23/2007  . HAND PAIN 07/31/2008  . HYPERLIPIDEMIA 02/05/2007  . HYPOTHYROIDISM 02/05/2007  . SYSTOLIC MURMUR 05/17/2007  . Depression     bipolar   Past Surgical History  Procedure Laterality Date  . Appendectomy    . Abdominal hysterectomy     No family history on file. History  Substance Use Topics  . Smoking status: Never Smoker   . Smokeless tobacco: Never Used  . Alcohol Use: No   OB History   Grav Para Term Preterm Abortions TAB SAB Ect Mult Living                 Review of Systems  Constitutional: Positive for fatigue. Negative for fever.  HENT: Negative for sore throat and rhinorrhea.   Eyes: Negative for redness.  Respiratory: Negative for cough and shortness of breath.   Cardiovascular: Negative for chest pain.  Gastrointestinal: Positive for nausea, diarrhea and constipation (1 week since last BM). Negative for vomiting and abdominal pain.  Genitourinary: Negative for dysuria.  Musculoskeletal: Negative for myalgias.  Skin: Negative for rash.  Neurological: Positive for weakness. Negative for headaches.    Allergies  Review of patient's allergies indicates no known allergies.  Home Medications   Current Outpatient Rx  Name  Route  Sig  Dispense  Refill  . aspirin 81 MG tablet   Oral   Take 81 mg by mouth daily.           . busPIRone (BUSPAR) 15 MG tablet   Oral   Take 15 mg by mouth 2 (two) times daily.          . Calcium Carbonate (CALCIUM 500 PO)   Oral   Take 2 tablets by mouth daily.           . Carbamazepine, Antipsychotic, (EQUETRO) 300 MG CP12   Oral   Take 4 capsules by mouth daily.           Marland Kitchen levothyroxine (SYNTHROID) 75 MCG tablet   Oral   Take 1 tablet (75 mcg total) by mouth daily.   90  tablet   3   . Multiple Vitamin (MULTIVITAMIN) tablet   Oral   Take 1 tablet by mouth daily.           Marland Kitchen perphenazine (TRILAFON) 2 MG tablet   Oral   Take 4 mg by mouth 2 (two) times daily.           . QUEtiapine (SEROQUEL) 400 MG tablet   Oral   Take 1,000 mg by mouth at bedtime.          . SF 5000 PLUS 1.1 % CREA dental cream               . simvastatin (ZOCOR) 40 MG tablet   Oral   Take 1 tablet (40 mg total) by mouth at bedtime.   90 tablet   3   . simvastatin (ZOCOR) 40 MG tablet      TAKE ONE TABLET BY MOUTH AT BEDTIME   90 tablet   1   . zolpidem (AMBIEN) 10 MG tablet   Oral   Take 1 tablet (10 mg total) by  mouth at bedtime as needed.   30 tablet   3    BP 137/71  Pulse 76  Temp(Src) 97.6 F (36.4 C) (Oral)  Resp 18  SpO2 95% Physical Exam  Nursing note and vitals reviewed. Constitutional: She is oriented to person, place, and time. She appears well-developed and well-nourished.  HENT:  Head: Normocephalic and atraumatic.  Right Ear: Tympanic membrane, external ear and ear canal normal.  Left Ear: Tympanic membrane, external ear and ear canal normal.  Nose: Nose normal.  Mouth/Throat: Uvula is midline. Mucous membranes are dry.  Eyes: Conjunctivae, EOM and lids are normal. Pupils are equal, round, and reactive to light. Right eye exhibits no discharge. Left eye exhibits no discharge. Right eye exhibits no nystagmus. Left eye exhibits no nystagmus.  Neck: Normal range of motion. Neck supple.  Cardiovascular: Normal rate and regular rhythm.  Exam reveals no friction rub.   Murmur (3/6 systolic) heard. Pulmonary/Chest: Effort normal and breath sounds normal.  Abdominal: Soft. There is no tenderness. There is no rebound and no guarding.  Musculoskeletal:       Cervical back: She exhibits normal range of motion, no tenderness and no bony tenderness.  Neurological: She is alert and oriented to person, place, and time. She has normal strength and normal reflexes. No cranial nerve deficit or sensory deficit. She displays a negative Romberg sign. Coordination and gait normal. GCS eye subscore is 4. GCS verbal subscore is 5. GCS motor subscore is 6.  Skin: Skin is warm and dry.  Psychiatric: She has a normal mood and affect.    ED Course  Procedures (including critical care time) Labs Review Labs Reviewed  CBC WITH DIFFERENTIAL - Abnormal; Notable for the following:    Hemoglobin 11.4 (*)    HCT 33.5 (*)    Neutrophils Relative % 84 (*)    Lymphocytes Relative 8 (*)    Lymphs Abs 0.5 (*)    All other components within normal limits  COMPREHENSIVE METABOLIC PANEL - Abnormal; Notable for  the following:    Glucose, Bld 153 (*)    Calcium 7.5 (*)    Albumin 3.3 (*)    AST 61 (*)    ALT 51 (*)    Total Bilirubin 0.2 (*)    GFR calc non Af Amer 83 (*)    All other components within normal limits  URINALYSIS, ROUTINE W REFLEX MICROSCOPIC -  Abnormal; Notable for the following:    Color, Urine BROWN (*)    APPearance CLOUDY (*)    pH 8.5 (*)    Bilirubin Urine SMALL (*)    All other components within normal limits  CARBAMAZEPINE LEVEL, TOTAL  POCT I-STAT TROPONIN I   Imaging Review Dg Chest 2 View  02/14/2013   CLINICAL DATA:  Weakness. Syncope.  EXAM: CHEST  2 VIEW  COMPARISON:  Chest CT 10/15/2006.  FINDINGS: The heart size and mediastinal contours are within normal limits. Both lungs are clear. The visualized skeletal structures are unremarkable. Linear subsegmental atelectasis is present adjacent to the left heart border. Monitoring leads project over the chest.  IMPRESSION: No active cardiopulmonary disease.   Electronically Signed   By: Andreas Newport M.D.   On: 02/14/2013 19:17   Ct Head Wo Contrast  02/15/2013   CLINICAL DATA:  Altered mental status. Difficulty with balance.  EXAM: CT HEAD WITHOUT CONTRAST  TECHNIQUE: Contiguous axial images were obtained from the base of the skull through the vertex without intravenous contrast.  COMPARISON:  None.  FINDINGS: No evidence of acute intracranial abnormality including infarction, hemorrhage, mass lesion, mass effect, midline shift or abnormal extra-axial fluid collection is identified. Mild atrophy and chronic microvascular ischemic change are noted. Imaged paranasal sinuses and mastoid air cells are clear. The calvarium is intact.  IMPRESSION: No acute finding.   Electronically Signed   By: Drusilla Kanner M.D.   On: 02/15/2013 00:16    5:52 PM Patient seen and examined. Work-up initiated. Medications ordered.   Vital signs reviewed and are as follows: Filed Vitals:   02/14/13 1637  BP: 137/71  Pulse: 76  Temp: 97.6  F (36.4 C)  Resp: 18   Patient was informed of results which are reassuring. Discussed with and seen by Dr. Juleen China. Attempted to ambulate. Patient can stand but is very off balance and cannot walk. Nurse notified myself.   I went and saw patient. I helped her up with help of tech. She has good strength and good coordination however cannot keep her balance without assistance.   Spoke with Dr. Juleen China. CT head completed and is negative.   Patient cannot ambulate. Will need admission to delineate why. Will need MRI brain to r/o posterior circulation problem.   1:09 AM I spoke with Dr. Allena Katz. He will see patient prior to deciding on bed type.    MDM   1. Ataxia   2. Syncope   3. Generalized weakness    Admit per above.     Renne Crigler, PA-C 02/15/13 0109

## 2013-02-14 NOTE — ED Notes (Addendum)
Unable to ambulate pt. Pt stood up and sts "feels funny" HR 70's and pulse ox 98%. With 2 person assist pt took one step forward and fell backwards in bed.   Josh PA-C made aware.

## 2013-02-14 NOTE — Telephone Encounter (Signed)
noted 

## 2013-02-14 NOTE — Telephone Encounter (Signed)
Patient Information:  Caller Name: [name]  Phone: [phone]  Patient: Rebecca Novak, Rebecca Novak  Gender: Female  DOB: 1945-09-09  Age: 67 Years  PCP: Eleonore Chiquito [facility])  Office Follow Up:  Does the office need to follow up with this patient?: No  Instructions For The Office: N/A  RN Note:  Too weak to stand; partner cannot assist her due to back problem. Agreed to call 911.  Symptoms  Reason For Call & Symptoms: Nauseated 02/14/13 with dry heaves.  No BM for one week.  Has not voided since 2230 02/13/13.  Feels like she could void now but would have to crawl to bathroom due to severe weakness. Had lower abdominal pain then fainted in bathroom 02/12/13.  Abdominal pain resolved.  Very weak; having difficulty sitting up to drink fluid from straw. Had 2 oz soda today.  Reviewed Health History In EMR: Yes  Reviewed Medications In EMR: Yes  Reviewed Allergies In EMR: Yes  Reviewed Surgeries / Procedures: Yes  Date of Onset of Symptoms: 02/12/2013  Guideline(s) Used:  Weakness (Generalized) and Fatigue  Disposition Per Guideline:   Call EMS 911 Now  Reason For Disposition Reached:   Shock suspected (e.g., cold/pale/clammy skin, too weak to stand)  Advice Given:  N/A  Patient Will Follow Care Advice:  YES

## 2013-02-14 NOTE — ED Notes (Signed)
Per EMS- got called out for "sick"- pt sitting on floor leaning over bed shaky. Pt has been feeling generalized weakness, vomiting, and dizziness. Stroke screen negative- no droop, or drift. Unable to correctly repeat "cant teach an old dog new tricks". abd nontender. HAS HAD 4mg  IV zofran. 12 lead normal. Pt sts last night went to bathroom and fell- unsure if caused by syncopal episode or weakness. Pt denies pain. Pt O2 sats initially 86/RA placed on 2L 92% and 96%/4L

## 2013-02-15 ENCOUNTER — Encounter (HOSPITAL_COMMUNITY): Payer: Self-pay | Admitting: General Practice

## 2013-02-15 ENCOUNTER — Observation Stay (HOSPITAL_COMMUNITY): Payer: Medicare Other

## 2013-02-15 DIAGNOSIS — R5381 Other malaise: Secondary | ICD-10-CM

## 2013-02-15 DIAGNOSIS — D649 Anemia, unspecified: Secondary | ICD-10-CM | POA: Diagnosis not present

## 2013-02-15 DIAGNOSIS — G459 Transient cerebral ischemic attack, unspecified: Secondary | ICD-10-CM

## 2013-02-15 DIAGNOSIS — I951 Orthostatic hypotension: Secondary | ICD-10-CM

## 2013-02-15 DIAGNOSIS — R42 Dizziness and giddiness: Secondary | ICD-10-CM | POA: Diagnosis not present

## 2013-02-15 DIAGNOSIS — R4182 Altered mental status, unspecified: Secondary | ICD-10-CM | POA: Diagnosis not present

## 2013-02-15 DIAGNOSIS — R279 Unspecified lack of coordination: Secondary | ICD-10-CM | POA: Diagnosis not present

## 2013-02-15 DIAGNOSIS — R112 Nausea with vomiting, unspecified: Secondary | ICD-10-CM | POA: Diagnosis not present

## 2013-02-15 DIAGNOSIS — R29898 Other symptoms and signs involving the musculoskeletal system: Secondary | ICD-10-CM | POA: Diagnosis present

## 2013-02-15 LAB — CBC WITH DIFFERENTIAL/PLATELET
Basophils Absolute: 0 10*3/uL (ref 0.0–0.1)
Eosinophils Absolute: 0 10*3/uL (ref 0.0–0.7)
Hemoglobin: 11.4 g/dL — ABNORMAL LOW (ref 12.0–15.0)
Lymphocytes Relative: 26 % (ref 12–46)
Lymphs Abs: 1.4 10*3/uL (ref 0.7–4.0)
MCV: 86.2 fL (ref 78.0–100.0)
Monocytes Relative: 11 % (ref 3–12)
Neutrophils Relative %: 63 % (ref 43–77)
Platelets: 204 10*3/uL (ref 150–400)
RBC: 3.83 MIL/uL — ABNORMAL LOW (ref 3.87–5.11)
WBC: 5.6 10*3/uL (ref 4.0–10.5)

## 2013-02-15 LAB — LIPID PANEL
LDL Cholesterol: 92 mg/dL (ref 0–99)
Total CHOL/HDL Ratio: 2.5 RATIO
Triglycerides: 89 mg/dL (ref <150)
VLDL: 18 mg/dL (ref 0–40)

## 2013-02-15 LAB — COMPREHENSIVE METABOLIC PANEL
ALT: 46 U/L — ABNORMAL HIGH (ref 0–35)
Alkaline Phosphatase: 77 U/L (ref 39–117)
BUN: 15 mg/dL (ref 6–23)
CO2: 28 mEq/L (ref 19–32)
Chloride: 106 mEq/L (ref 96–112)
GFR calc Af Amer: 80 mL/min — ABNORMAL LOW (ref >90)
GFR calc non Af Amer: 69 mL/min — ABNORMAL LOW (ref >90)
Glucose, Bld: 119 mg/dL — ABNORMAL HIGH (ref 70–99)
Potassium: 3.9 mEq/L (ref 3.5–5.1)
Sodium: 142 mEq/L (ref 135–145)
Total Bilirubin: 0.2 mg/dL — ABNORMAL LOW (ref 0.3–1.2)

## 2013-02-15 LAB — HEMOGLOBIN A1C: Hgb A1c MFr Bld: 5.8 % — ABNORMAL HIGH (ref <5.7)

## 2013-02-15 LAB — RAPID URINE DRUG SCREEN, HOSP PERFORMED
Benzodiazepines: NOT DETECTED
Cocaine: NOT DETECTED
Opiates: NOT DETECTED

## 2013-02-15 MED ORDER — CARBAMAZEPINE ER 200 MG PO TB12
300.0000 mg | ORAL_TABLET | ORAL | Status: DC
  Administered 2013-02-16: 300 mg via ORAL
  Filled 2013-02-15: qty 1

## 2013-02-15 MED ORDER — POLYETHYLENE GLYCOL 3350 17 G PO PACK
17.0000 g | PACK | Freq: Every day | ORAL | Status: DC | PRN
  Administered 2013-02-15: 17 g via ORAL
  Filled 2013-02-15: qty 1

## 2013-02-15 MED ORDER — CARBAMAZEPINE ER 200 MG PO CP12
1200.0000 mg | ORAL_CAPSULE | Freq: Every day | ORAL | Status: DC
  Filled 2013-02-15: qty 6

## 2013-02-15 MED ORDER — ASPIRIN 81 MG PO TABS: 81.0000 mg | ORAL_TABLET | Freq: Every day | ORAL | Status: DC

## 2013-02-15 MED ORDER — PERPHENAZINE 4 MG PO TABS
6.0000 mg | ORAL_TABLET | Freq: Every day | ORAL | Status: DC
  Administered 2013-02-15: 6 mg via ORAL
  Filled 2013-02-15 (×3): qty 1

## 2013-02-15 MED ORDER — ZOLPIDEM TARTRATE 5 MG PO TABS: 10.0000 mg | ORAL_TABLET | Freq: Once | ORAL | Status: DC

## 2013-02-15 MED ORDER — CARBAMAZEPINE ER 400 MG PO TB12
600.0000 mg | ORAL_TABLET | ORAL | Status: DC
  Administered 2013-02-15: 600 mg via ORAL
  Filled 2013-02-15 (×2): qty 1

## 2013-02-15 MED ORDER — ENOXAPARIN SODIUM 40 MG/0.4ML ~~LOC~~ SOLN
40.0000 mg | SUBCUTANEOUS | Status: DC
  Administered 2013-02-15 – 2013-02-16 (×2): 40 mg via SUBCUTANEOUS
  Filled 2013-02-15 (×2): qty 0.4

## 2013-02-15 MED ORDER — BUSPIRONE HCL 15 MG PO TABS
15.0000 mg | ORAL_TABLET | Freq: Two times a day (BID) | ORAL | Status: DC
  Administered 2013-02-15 – 2013-02-16 (×4): 15 mg via ORAL
  Filled 2013-02-15: qty 1
  Filled 2013-02-15: qty 2
  Filled 2013-02-15 (×3): qty 1

## 2013-02-15 MED ORDER — CARBAMAZEPINE ER 300 MG PO CP12: 4.0000 | ORAL_CAPSULE | Freq: Every day | ORAL | Status: DC

## 2013-02-15 MED ORDER — PERPHENAZINE 2 MG PO TABS
2.0000 mg | ORAL_TABLET | Freq: Every morning | ORAL | Status: DC
  Administered 2013-02-15 – 2013-02-16 (×2): 2 mg via ORAL
  Filled 2013-02-15 (×2): qty 1

## 2013-02-15 MED ORDER — QUETIAPINE FUMARATE 400 MG PO TABS
1200.0000 mg | ORAL_TABLET | Freq: Every day | ORAL | Status: DC
  Administered 2013-02-15: 1200 mg via ORAL
  Filled 2013-02-15 (×2): qty 3

## 2013-02-15 MED ORDER — ASPIRIN 81 MG PO CHEW
81.0000 mg | CHEWABLE_TABLET | Freq: Every day | ORAL | Status: DC
  Administered 2013-02-15 – 2013-02-16 (×2): 81 mg via ORAL
  Filled 2013-02-15: qty 1

## 2013-02-15 MED ORDER — CARBAMAZEPINE ER 200 MG PO TB12
300.0000 mg | ORAL_TABLET | ORAL | Status: DC
  Filled 2013-02-15: qty 1

## 2013-02-15 MED ORDER — SIMVASTATIN 40 MG PO TABS
40.0000 mg | ORAL_TABLET | Freq: Every day | ORAL | Status: DC
Start: 2013-02-15 — End: 2013-02-16
  Administered 2013-02-15: 40 mg via ORAL
  Filled 2013-02-15 (×2): qty 1

## 2013-02-15 MED ORDER — SODIUM CHLORIDE 0.9 % IV SOLN
INTRAVENOUS | Status: DC
  Administered 2013-02-15: 13:00:00 via INTRAVENOUS

## 2013-02-15 MED ORDER — LEVOTHYROXINE SODIUM 75 MCG PO TABS
75.0000 ug | ORAL_TABLET | Freq: Every day | ORAL | Status: DC
  Administered 2013-02-16: 75 ug via ORAL
  Filled 2013-02-15 (×2): qty 1

## 2013-02-15 MED ORDER — ZOLPIDEM TARTRATE 5 MG PO TABS
5.0000 mg | ORAL_TABLET | Freq: Once | ORAL | Status: AC
  Administered 2013-02-15: 5 mg via ORAL
  Filled 2013-02-15: qty 1

## 2013-02-15 MED ORDER — CARBAMAZEPINE ER 400 MG PO TB12
600.0000 mg | ORAL_TABLET | Freq: Two times a day (BID) | ORAL | Status: DC
  Filled 2013-02-15: qty 1

## 2013-02-15 NOTE — Progress Notes (Signed)
VASCULAR LAB PRELIMINARY  PRELIMINARY  PRELIMINARY  PRELIMINARY  Carotid duplex  completed.    Preliminary report:  Bilateral:  1-39% ICA stenosis.  Vertebral artery flow is antegrade.      Rebecca Novak, RVT 02/15/2013, 2:13 PM

## 2013-02-15 NOTE — Evaluation (Signed)
Occupational Therapy Evaluation and Discharge Summary Patient Details Name: Rebecca Novak MRN: [id number] DOB: 1945-11-27 Today's Date: 02/15/2013 Time: 0865-7846 OT Time Calculation (min): 25 min  OT Assessment / Plan / Recommendation History of present illness Pt is a 67 yo female with 2 day h/o N/V and has not had bowel movement for 2 weeks per report.  Pt fell at home Sunday night and c/o LE weakness.  MRI and CT were both negative.     Clinical Impression   Pt admitted with the above complaints but appears to be moving closer to baseline level of functioning and completing basic adls w/o assist. Pt has assist at home and does not have further OT needs.    OT Assessment  Patient does not need any further OT services    Follow Up Recommendations  No OT follow up    Barriers to Discharge      Equipment Recommendations  None recommended by OT    Recommendations for Other Services    Frequency       Precautions / Restrictions Precautions Precautions: Fall Precaution Comments: pt reports this is her only fall  Restrictions Weight Bearing Restrictions: No   Pertinent Vitals/Pain Pt had no pain this am.  Pt with HR of 85, O2 sat 92%, and BP 133/85.    ADL  Eating/Feeding: Simulated;Independent Where Assessed - Eating/Feeding: Edge of bed Grooming: Set up;Performed;Teeth care;Wash/dry face Where Assessed - Grooming: Unsupported standing Upper Body Bathing: Simulated;Set up Where Assessed - Upper Body Bathing: Unsupported sitting Lower Body Bathing: Simulated;Set up Where Assessed - Lower Body Bathing: Unsupported sit to stand Upper Body Dressing: Performed;Set up Where Assessed - Upper Body Dressing: Unsupported sitting Lower Body Dressing: Performed;Set up Where Assessed - Lower Body Dressing: Unsupported sit to stand Toilet Transfer: Performed;Supervision/safety Toilet Transfer Method: Other (comment) (walked to bathroom) Toilet Transfer Equipment: Comfort height  toilet;Grab bars Toileting - Clothing Manipulation and Hygiene: Performed;Supervision/safety Where Assessed - Toileting Clothing Manipulation and Hygiene: Sit to stand from 3-in-1 or toilet Transfers/Ambulation Related to ADLs: Pt walked in room with S initially with HHA and then with nothing. ADL Comments: Pt was able to complete all adls with S to I.    OT Diagnosis:    OT Problem List:   OT Treatment Interventions:     OT Goals(Current goals can be found in the care plan section) Acute Rehab OT Goals Patient Stated Goal: to get some sleep  Visit Information  Last OT Received On: 02/15/13 Assistance Needed: +1 History of Present Illness: Pt is a 67 yo female with 2 day h/o N/V and has not had bowel movement for 2 weeks per report.  Pt fell at home Sunday night and c/o LE weakness.  MRI and CT were both negative.         Prior Functioning     Home Living Family/patient expects to be discharged to:: Private residence Living Arrangements: Spouse/significant other Available Help at Discharge: Available 24 hours/day Type of Home: House Home Access: Stairs to enter [employer] of Steps: 2 Home Layout: One level Home Equipment: None Prior Function Level of Independence: Independent Communication Communication: No difficulties Dominant Hand: Right         Vision/Perception Vision - History Baseline Vision: No visual deficits Patient Visual Report: No change from baseline Vision - Assessment Vision Assessment: Vision not tested   Cognition  Cognition Arousal/Alertness: Awake/alert Behavior During Therapy: WFL for tasks assessed/performed Overall Cognitive Status:  (pt mildly slow to process but otherwise  appeared ok)    Extremity/Trunk Assessment Upper Extremity Assessment Upper Extremity Assessment: Overall WFL for tasks assessed Lower Extremity Assessment Lower Extremity Assessment: Defer to PT evaluation Cervical / Trunk Assessment Cervical / Trunk  Assessment: Normal     Mobility Bed Mobility Bed Mobility: Supine to Sit;Sitting - Scoot to Edge of Bed;Sit to Supine Supine to Sit: 6: Modified independent (Device/Increase time) Sitting - Scoot to Edge of Bed: 6: Modified independent (Device/Increase time) Sit to Supine: 6: Modified independent (Device/Increase time) Details for Bed Mobility Assistance: no assist necessary Transfers Transfers: Sit to Stand;Stand to Sit Sit to Stand: 5: Supervision Stand to Sit: 5: Supervision Details for Transfer Assistance: Instructed pt to take her time and continue to call for assist to walk to bathroom in wake of events of last 24 hours.  Pt states she feels like she is at her baseline.  Pt did move slowly and deliberately.     Exercise     Balance Balance Balance Assessed: Yes Static Standing Balance Static Standing - Balance Support: During functional activity Static Standing - Level of Assistance: 5: Stand by assistance Static Standing - Comment/# of Minutes: 5   End of Session OT - End of Session Activity Tolerance: Patient tolerated treatment well Patient left: in bed;with call bell/phone within reach Nurse Communication: Mobility status;Other (comment) (pts O2 sat at 92%.  Pt c/o no bowel movement in 2weeks)  GO Functional Assessment Tool Used: clinical judgement Functional Limitation: Self care Self Care Current Status (Z6109): At least 1 percent but less than 20 percent impaired, limited or restricted Self Care Goal Status (U0454): At least 1 percent but less than 20 percent impaired, limited or restricted Self Care Discharge Status [phone]): At least 1 percent but less than 20 percent impaired, limited or restricted   Hope Budds 02/15/2013, 12:39 PM [phone]

## 2013-02-15 NOTE — Evaluation (Signed)
Physical Therapy Evaluation Patient Details Name: Rebecca Novak MRN: [id number] DOB: [birth year] Today's Date: 02/15/2013 Time: 4098-1191 PT Time Calculation (min): 22 min  PT Assessment / Plan / Recommendation History of Present Illness  Pt is a 67 yo female with 2 day h/o N/V and has not had bowel movement for 2 weeks per report.  Pt fell at home Sunday night and c/o LE weakness.  MRI and CT were both negative.    Clinical Impression  Pt admitted with fall and generalized weakness. Pt currently with functional limitations due to the deficits listed below (see PT Problem List).  Pt will benefit from skilled PT to increase their independence and safety with mobility to allow discharge to the venue listed below.     PT Assessment  Patient needs continued PT services    Follow Up Recommendations  Outpatient PT (if balance deficits persist)    Does the patient have the potential to tolerate intense rehabilitation      Barriers to Discharge        Equipment Recommendations  None recommended by PT    Recommendations for Other Services     Frequency Min 3X/week    Precautions / Restrictions Precautions Precautions: Fall Precaution Comments: pt reports this is her only fall  Restrictions Weight Bearing Restrictions: No   Pertinent Vitals/Pain Pt. Denied pain      Mobility  Bed Mobility Bed Mobility: Supine to Sit;Sitting - Scoot to Edge of Bed;Sit to Supine Supine to Sit: 7: Independent;HOB flat Sitting - Scoot to Edge of Bed: 7: Independent Sit to Supine: 6: Modified independent (Device/Increase time) Details for Bed Mobility Assistance: no assist necessary Transfers Transfers: Sit to Stand;Stand to Sit Sit to Stand: 5: Supervision;From bed Stand to Sit: 5: Supervision;To chair/3-in-1 Details for Transfer Assistance: Instructed pt to take her time and continue to call for assist to walk to bathroom in wake of events of last 24 hours.  Pt states she feels like she is  at her baseline.  Pt did move slowly and deliberately. Ambulation/Gait Ambulation/Gait Assistance: 4: Min guard Ambulation Distance (Feet): 100 Feet Assistive device: None (pt. initially used rail in hall, progressed to no support) Ambulation/Gait Assistance Details: very deliberate and small steps initially, progressed to more natural gait with VCs and reassurance Gait Pattern: Decreased step length - right;Decreased step length - left Stairs: No Wheelchair Mobility Wheelchair Mobility: No    Exercises     PT Diagnosis: Difficulty walking  PT Problem List: Decreased activity tolerance;Decreased balance;Decreased mobility PT Treatment Interventions: Gait training;Stair training;Functional mobility training;Therapeutic activities;Therapeutic exercise;Balance training;Neuromuscular re-education     PT Goals(Current goals can be found in the care plan section) Acute Rehab PT Goals Patient Stated Goal: pt. ready to go home PT Goal Formulation: With patient Time For Goal Achievement: 02/22/13 Potential to Achieve Goals: Good  Visit Information  Last PT Received On: 02/15/13 Assistance Needed: +1 History of Present Illness: Pt is a 67 yo female with 2 day h/o N/V and has not had bowel movement for 2 weeks per report.  Pt fell at home Sunday night and c/o LE weakness.  MRI and CT were both negative.         Prior Functioning  Home Living Family/patient expects to be discharged to:: Private residence Living Arrangements: Spouse/significant other Available Help at Discharge: Available 24 hours/day Type of Home: House Home Access: Stairs to enter [employer] of Steps: 2 Home Layout: One level Home Equipment: None Prior Function Level of Independence:  Independent Communication Communication: No difficulties Dominant Hand: Right    Cognition  Cognition Arousal/Alertness: Awake/alert Behavior During Therapy: WFL for tasks assessed/performed Overall Cognitive Status:  Within Functional Limits for tasks assessed    Extremity/Trunk Assessment Upper Extremity Assessment Upper Extremity Assessment: Overall WFL for tasks assessed Lower Extremity Assessment Lower Extremity Assessment: Overall WFL for tasks assessed Cervical / Trunk Assessment Cervical / Trunk Assessment: Normal   Balance Balance Balance Assessed: Yes Static Standing Balance Static Standing - Balance Support: During functional activity Static Standing - Level of Assistance: 5: Stand by assistance Static Standing - Comment/# of Minutes: 5 Dynamic Standing Balance Dynamic Standing - Balance Support: During functional activity;No upper extremity supported Dynamic Standing - Level of Assistance: 4: Min assist  End of Session PT - End of Session Equipment Utilized During Treatment: Gait belt Activity Tolerance: Patient tolerated treatment well Patient left: in chair;with call bell/phone within reach Nurse Communication: Mobility status  GP Functional Assessment Tool Used: clinical judgement Functional Limitation: Mobility: Walking and moving around Mobility: Walking and Moving Around Current Status (Z6109): At least 1 percent but less than 20 percent impaired, limited or restricted Mobility: Walking and Moving Around Goal Status [phone]): 0 percent impaired, limited or restricted   Ernestene Mention 02/15/2013, 3:16 PM  Geraldine Contras, PT, DPT [phone]

## 2013-02-15 NOTE — H&P (Signed)
[facility] History and Physical  Patient: Rebecca Novak  MVH:[id number]  DOB: January 28, 1946  DOA: 02/14/2013  Referring physician: Renne Crigler, PA-C PCP: Rogelia Boga, MD  Consults:   neurology  Chief Complaint: Generalized weakness  HPI: Rebecca Novak is a 66 y.o. female with Past medical history of bipolar disorder, dyslipidemia, hypothyroidism. She presented today with the complaint of generalized weakness and vomiting that has been ongoing since last few days. Sunday night the patient also had a fall in her bathroom although she denies any loss of consciousness. Since last 2 days she has been having nausea and vomiting she mentions that she might have vomited 4-5 times today and yesterday she denies any active bleeding or abdominal pain she denies any diarrhea.But she does mention that she has generalized weakness and tiredness. She denies any fever, chills, cough, chest pain, shortness of breath, vertigo, focal neurological deficit in upper extremity, headache, blurring of the vision. She denies any recent change in her medications as well.  Review of Systems: as mentioned in the history of present illness.  A Comprehensive review of the other systems is negative.  Past Medical History  Diagnosis Date  . ANEMIA, MILD 05/17/2008  . COLONIC POLYPS, HX OF 05/17/2007  . GLUCOSE INTOLERANCE 11/23/2007  . HAND PAIN 07/31/2008  . HYPERLIPIDEMIA 02/05/2007  . HYPOTHYROIDISM 02/05/2007  . SYSTOLIC MURMUR 05/17/2007  . Depression     bipolar   Past Surgical History  Procedure Laterality Date  . Appendectomy    . Abdominal hysterectomy     Social History:  reports that she has never smoked. She has never used smokeless tobacco. She reports that she does not drink alcohol or use illicit drugs. Patient is coming from home. Independent for most of her  ADL.  No Known Allergies  No family history on file.  Prior to Admission medications   Medication Sig  Start Date End Date Taking? Authorizing Provider  aspirin 81 MG tablet Take 81 mg by mouth daily.     Yes Historical Provider, MD  busPIRone (BUSPAR) 15 MG tablet Take 15 mg by mouth 2 (two) times daily.  11/18/12  Yes Historical Provider, MD  Calcium Carbonate (CALCIUM 500 PO) Take 2 tablets by mouth daily.     Yes Historical Provider, MD  Carbamazepine, Antipsychotic, (EQUETRO) 300 MG CP12 Take 4 capsules by mouth daily.     Yes Historical Provider, MD  levothyroxine (SYNTHROID) 75 MCG tablet Take 1 tablet (75 mcg total) by mouth daily. 12/22/12  Yes Gordy Savers, MD  Multiple Vitamin (MULTIVITAMIN) tablet Take 1 tablet by mouth daily.     Yes Historical Provider, MD  perphenazine (TRILAFON) 2 MG tablet Take 2-6 mg by mouth 2 (two) times daily. 2 mg in the am and 6 mg in pm   Yes Historical Provider, MD  QUEtiapine (SEROQUEL) 400 MG tablet Take 1,200 mg by mouth at bedtime.    Yes Historical Provider, MD  SF 5000 PLUS 1.1 % CREA dental cream Place 1 application onto teeth at bedtime.  10/30/11  Yes Historical Provider, MD  simvastatin (ZOCOR) 40 MG tablet Take 1 tablet (40 mg total) by mouth at bedtime. 12/22/12  Yes Gordy Savers, MD  zolpidem (AMBIEN) 10 MG tablet Take 1 tablet (10 mg total) by mouth at bedtime as needed. 12/24/11  Yes Gordy Savers, MD    Physical Exam: Filed Vitals:   02/14/13 2230 02/14/13 2245 02/14/13 2300 02/14/13 2324  BP: 142/80 134/99 151/83 146/77  Pulse: 77 73 76 73  Temp:      TempSrc:      Resp: 16 18 17 16   SpO2: 97% 97% 100% 97%    General: Alert, Awake and Oriented to Time, Place and Person. Appear in mild distress Eyes: PERRL ENT: Oral Mucosa clear significantly dry. Neck: No  JVD, no  Carotid Bruits  Cardiovascular: S1 and S2 Present, aortic systolic  Murmur, Peripheral Pulses Present Respiratory: Bilateral Air entry equal and Decreased, Clear to Auscultation,  No  Crackles,no  wheezes Abdomen: Bowel Sound Present, Soft and Non  tender Skin: No  Rash Extremities: No  Pedal edema, no  calf tenderness Neurologic: Other than mild imbalance, loss of station, grossly unremarkable.  Labs on Admission:  CBC:  Recent Labs Lab 02/14/13 1836  WBC 6.3  NEUTROABS 5.3  HGB 11.4*  HCT 33.5*  MCV 86.6  PLT 196    CMP     Component Value Date/Time   NA 138 02/14/2013 1836   K 4.5 02/14/2013 1836   CL 102 02/14/2013 1836   CO2 28 02/14/2013 1836   GLUCOSE 153* 02/14/2013 1836   GLUCOSE 86 05/05/2006 0904   BUN 22 02/14/2013 1836   CREATININE 0.78 02/14/2013 1836   CALCIUM 7.5* 02/14/2013 1836   PROT 6.2 02/14/2013 1836   ALBUMIN 3.3* 02/14/2013 1836   AST 61* 02/14/2013 1836   ALT 51* 02/14/2013 1836   ALKPHOS 79 02/14/2013 1836   BILITOT 0.2* 02/14/2013 1836   GFRNONAA 83* 02/14/2013 1836   GFRAA >90 02/14/2013 1836    No results found for this basename: LIPASE, AMYLASE,  in the last 168 hours No results found for this basename: AMMONIA,  in the last 168 hours  Cardiac Enzymes: No results found for this basename: CKTOTAL, CKMB, CKMBINDEX, TROPONINI,  in the last 168 hours  BNP (last 3 results) No results found for this basename: PROBNP,  in the last 8760 hours  Radiological Exams on Admission: Dg Chest 2 View  02/14/2013   CLINICAL DATA:  Weakness. Syncope.  EXAM: CHEST  2 VIEW  COMPARISON:  Chest CT 10/15/2006.  FINDINGS: The heart size and mediastinal contours are within normal limits. Both lungs are clear. The visualized skeletal structures are unremarkable. Linear subsegmental atelectasis is present adjacent to the left heart border. Monitoring leads project over the chest.  IMPRESSION: No active cardiopulmonary disease.   Electronically Signed   By: Andreas Newport M.D.   On: 02/14/2013 19:17   Ct Head Wo Contrast  02/15/2013   CLINICAL DATA:  Altered mental status. Difficulty with balance.  EXAM: CT HEAD WITHOUT CONTRAST  TECHNIQUE: Contiguous axial images were obtained from the base of the skull through the  vertex without intravenous contrast.  COMPARISON:  None.  FINDINGS: No evidence of acute intracranial abnormality including infarction, hemorrhage, mass lesion, mass effect, midline shift or abnormal extra-axial fluid collection is identified. Mild atrophy and chronic microvascular ischemic change are noted. Imaged paranasal sinuses and mastoid air cells are clear. The calvarium is intact.  IMPRESSION: No acute finding.   Electronically Signed   By: Drusilla Kanner M.D.   On: 02/15/2013 00:16    EKG: Independently reviewed. nonspecific ST and T waves changes.  Assessment/Plan Principal Problem:   Weakness of both legs Active Problems:   HYPOTHYROIDISM   HYPERLIPIDEMIA   Nausea & vomiting   Orthostatic hypotension   1. Weakness of both legs The patient appears to have generalized weakness of both legs. She actually has orthostatic  drop in her blood pressure from 160 systolic to 130 systolic on standing. She had recent nausea and vomiting which appears resolved at present. She had mild worsening of her LFT. With this I will admit her for observation. I will hydrate her with IV fluids. Considering her history of syncope and generalized weakness that is a possibility of TIA of the posterior circulation which I will rule out with MRI. PTOT consultation will be requested. At present I would continue her on aspirin.  2.Hypothyroidism  Continue Synthroid   3.Bipolar disorder  Although the patient appears to be on multiple medications for her bipolar disorder. I would continue them at home dose.  4.Nausea and vomiting Etiology currently unclear. Symptoms might have resolved. Continue monitoring. If her LFTs progressively getting worse then she may require imaging.  DVT Prophylaxis: subcutaneous Heparin Nutrition: As tolerated  Code Status: Full  Disposition: Admitted to observation in telemetry.  Author: Lynden Oxford, MD [facility] Pager: [phone] 02/15/2013, 2:10 AM     If 7PM-7AM, please contact night-coverage [email or web address] Password [email or web address]

## 2013-02-15 NOTE — ED Notes (Signed)
Pt companion, Mr. Lilla Shook ([phone]) left phone number to be contacted whether pt is staying or leaving or to be notified for other issues.

## 2013-02-15 NOTE — Care Management Note (Unsigned)
    Page 1 of 1   02/15/2013     2:34:43 PM   CARE MANAGEMENT NOTE 02/15/2013  Patient:  Rebecca Novak, Rebecca Novak   Account Number:  [id number]  Date Initiated:  02/15/2013  Documentation initiated by:  Ulis Kaps  Subjective/Objective Assessment:   PT ADM ON 02/14/13 WITH N/V, WEAKNESS.  PTA, PT LIVES AT HOME WITH PARTNER, AND IS INDEPENDENT.  SHE HAS RW AT HOME, IF NEEDED.     Action/Plan:   WILL FOLLOW FOR DISCHARGE NEEDS AS PT PROGRESSES.  PARTNER TO PROVIDE CARE AT DC.   Anticipated DC Date:  02/16/2013   Anticipated DC Plan:  HOME/SELF CARE      DC Planning Services  CM consult      Choice offered to / List presented to:             Status of service:  In process, will continue to follow Medicare Important Message given?   (If response is "NO", the following Medicare IM given date fields will be blank) Date Medicare IM given:   Date Additional Medicare IM given:    Discharge Disposition:    Per UR Regulation:  Reviewed for med. necessity/level of care/duration of stay  If discussed at Long Length of Stay Meetings, dates discussed:    Comments:

## 2013-02-15 NOTE — ED Notes (Signed)
Pt denies nausea at this time. Pt states she does not feel weak at this time because she is laying down. Pt states she feels like she would be very weak if she tried to get up.

## 2013-02-15 NOTE — Progress Notes (Signed)
Patient admitted by Dr. Allena Katz this AM.  Please see H&P.  Await MRI, echo, carotids.  Marlin Canary DO

## 2013-02-16 DIAGNOSIS — R112 Nausea with vomiting, unspecified: Secondary | ICD-10-CM | POA: Diagnosis not present

## 2013-02-16 DIAGNOSIS — R5381 Other malaise: Secondary | ICD-10-CM | POA: Diagnosis not present

## 2013-02-16 DIAGNOSIS — F329 Major depressive disorder, single episode, unspecified: Secondary | ICD-10-CM

## 2013-02-16 DIAGNOSIS — R29898 Other symptoms and signs involving the musculoskeletal system: Secondary | ICD-10-CM

## 2013-02-16 DIAGNOSIS — R279 Unspecified lack of coordination: Secondary | ICD-10-CM | POA: Diagnosis not present

## 2013-02-16 MED ORDER — PERPHENAZINE 2 MG PO TABS: 2.0000 mg | ORAL_TABLET | Freq: Every morning | ORAL | Status: DC

## 2013-02-16 NOTE — Discharge Summary (Signed)
Physician Discharge Summary  Rebecca Novak ZOX:[id number] DOB: Mar 19, 1946 DOA: 02/14/2013  PCP: Rogelia Boga, MD  Admit date: 02/14/2013 Discharge date: 02/16/2013  Time spent: 35 minutes   Discharge Diagnoses:  Principal Problem:   Weakness of both legs Active Problems:   HYPOTHYROIDISM   HYPERLIPIDEMIA   Nausea & vomiting   Orthostatic hypotension   Discharge Condition: Stable  Diet recommendation: Heart healthy  Filed Weights   02/15/13 0901  Weight: 87.091 kg (192 lb)    History of present illness:  Rebecca Novak is a 67 y.o. female with Past medical history of bipolar disorder, dyslipidemia, hypothyroidism.  She presented today with the complaint of generalized weakness and vomiting that has been ongoing since last few days. Sunday night the patient also had a fall in her bathroom although she denies any loss of consciousness. Since last 2 days she has been having nausea and vomiting she mentions that she might have vomited 4-5 times today and yesterday she denies any active bleeding or abdominal pain she denies any diarrhea.But she does mention that she has generalized weakness and tiredness.  She denies any fever, chills, cough, chest pain, shortness of breath, vertigo, focal neurological deficit in upper extremity, headache, blurring of the vision.  She denies any recent change in her medications as well.   Hospital Course:  Patient is a pleasant 67 year old female with a history of bipolar disorder, dyslipidemia, who was admitted to the medicine service on 02/15/2013, patient presented with complaints of bilateral extremity generalized weakness with associated vomiting over the last several days. Prior to admission, she reported having a fall in her bathroom without loss of consciousness. She was admitted for CVA/TIA workup. Initial CT scan of brain without contrast showed no acute intracranial abnormality. This was followed by an MRI of brain which  showed no evidence of acute stroke or acute intracranial abnormality. Or weakness may have resulted from dehydration/hypovolemia in setting of nausea vomiting. Symptoms resolved after the administration of IV fluids during this hospitalization. On the morning of discharge, she reported feeling better, and later on her room, tolerating by mouth intake. She stated been back to her normal self. She was discharged in stable condition to followup with her primary care provider in one week.   Consultations:  Physical therapy  Discharge Exam: Filed Vitals:   02/16/13 0609  BP: 111/65  Pulse: 73  Temp: 98 F (36.7 C)  Resp: 20    General: No acute distress awake alert oriented. States feeling better. Cardiovascular: Regular rate rhythm normal S1-S2 no murmurs rubs gallops Respiratory: Lungs are clear to auscultation bilaterally no wheezing rhonchi or rales Abdomen: Soft nontender nondistended positive bowel sounds Extremities: No cyanosis clubbing or edema Neurological: Patient having a nonfocal neurologic examination. She had 5 of 5 muscle strength as well as 2+ deep tendon reflexes.  Discharge Instructions  Discharge Orders   Future Orders Complete By Expires   Call MD for:  difficulty breathing, headache or visual disturbances  As directed    Call MD for:  persistant dizziness or light-headedness  As directed    Call MD for:  persistant nausea and vomiting  As directed    Diet - low sodium heart healthy  As directed    Discharge instructions  As directed    Comments:     Please keep follow up appointments   Increase activity slowly  As directed        Medication List    STOP taking these medications  multivitamin tablet     zolpidem 10 MG tablet  Commonly known as:  AMBIEN      TAKE these medications       aspirin 81 MG tablet  Take 81 mg by mouth daily.     busPIRone 15 MG tablet  Commonly known as:  BUSPAR  Take 15 mg by mouth 2 (two) times daily.      CALCIUM 500 PO  Take 2 tablets by mouth daily.     EQUETRO 300 MG Cp12  Generic drug:  Carbamazepine  Take 4 capsules by mouth daily. One capsule [300 mg] at 11:30 AM, one capsule [300 mg] at 7:30 PM, and two capsules [600 mg] at 11:30 PM.     levothyroxine 75 MCG tablet  Commonly known as:  SYNTHROID  Take 1 tablet (75 mcg total) by mouth daily.     perphenazine 2 MG tablet  Commonly known as:  TRILAFON  Take 1 tablet (2 mg total) by mouth every morning.     QUEtiapine 400 MG tablet  Commonly known as:  SEROQUEL  Take 1,200 mg by mouth at bedtime.     SF 5000 PLUS 1.1 % Crea dental cream  Generic drug:  sodium fluoride  Place 1 application onto teeth at bedtime.     simvastatin 40 MG tablet  Commonly known as:  ZOCOR  Take 1 tablet (40 mg total) by mouth at bedtime.       No Known Allergies     Follow-up Information   Follow up with Rogelia Boga, MD In 1 week.   Specialty:  Internal Medicine   Contact information:   [address] [facility] [place] [address] [phone]        The results of significant diagnostics from this hospitalization (including imaging, microbiology, ancillary and laboratory) are listed below for reference.    Significant Diagnostic Studies: Dg Chest 2 View  02/14/2013   CLINICAL DATA:  Weakness. Syncope.  EXAM: CHEST  2 VIEW  COMPARISON:  Chest CT 10/15/2006.  FINDINGS: The heart size and mediastinal contours are within normal limits. Both lungs are clear. The visualized skeletal structures are unremarkable. Linear subsegmental atelectasis is present adjacent to the left heart border. Monitoring leads project over the chest.  IMPRESSION: No active cardiopulmonary disease.   Electronically Signed   By: Andreas Newport M.D.   On: 02/14/2013 19:17   Ct Head Wo Contrast  02/15/2013   CLINICAL DATA:  Altered mental status. Difficulty with balance.  EXAM: CT HEAD WITHOUT CONTRAST  TECHNIQUE: Contiguous axial images were obtained from  the base of the skull through the vertex without intravenous contrast.  COMPARISON:  None.  FINDINGS: No evidence of acute intracranial abnormality including infarction, hemorrhage, mass lesion, mass effect, midline shift or abnormal extra-axial fluid collection is identified. Mild atrophy and chronic microvascular ischemic change are noted. Imaged paranasal sinuses and mastoid air cells are clear. The calvarium is intact.  IMPRESSION: No acute finding.   Electronically Signed   By: Drusilla Kanner M.D.   On: 02/15/2013 00:16   Mr Brain Wo Contrast  02/15/2013   CLINICAL DATA:  67 year old female with lower extremity weakness, dizziness, ataxia. Possible posterior circulation ischemia.  EXAM: MRI HEAD WITHOUT CONTRAST  TECHNIQUE: Multiplanar, multisequence MR imaging was performed. No intravenous contrast was administered.  COMPARISON:  Head CT without contrast 02/14/2013.  FINDINGS: No restricted diffusion to suggest acute infarction. No midline shift, mass effect, evidence of mass lesion, ventriculomegaly, extra-axial collection or acute intracranial  hemorrhage. Cervicomedullary junction and pituitary are within normal limits. Negative for age visualized cervical spine. Major intracranial vascular flow voids are preserved; dominant distal left vertebral artery and also fetal type posterior cerebral artery origins are suspected.  Mild for age scattered nonspecific cerebral white matter foci of T2 and FLAIR hyperintensity. No cortical encephalomalacia. Deep gray matter nuclei demonstrate mild T2 heterogeneity which appears largely related to dilated perivascular spaces. Brainstem and cerebellum are within normal limits.  Grossly normal visualized internal auditory structures. Mastoids are clear. Negative paranasal sinuses. Visualized orbit soft tissues are within normal limits. Negative scalp soft tissues. Normal bone marrow signal.  IMPRESSION: 1. No acute intracranial abnormality.  2. Mild for age nonspecific  cerebral white matter signal changes.   Electronically Signed   By: Augusto Gamble M.D.   On: 02/15/2013 07:57    Microbiology: No results found for this or any previous visit (from the past 240 hour(s)).   Labs: Basic Metabolic Panel:  Recent Labs Lab 02/14/13 1836 02/15/13 0400  NA 138 142  K 4.5 3.9  CL 102 106  CO2 28 28  GLUCOSE 153* 119*  BUN 22 15  CREATININE 0.78 0.84  CALCIUM 7.5* 7.8*   Liver Function Tests:  Recent Labs Lab 02/14/13 1836 02/15/13 0400  AST 61* 51*  ALT 51* 46*  ALKPHOS 79 77  BILITOT 0.2* 0.2*  PROT 6.2 6.1  ALBUMIN 3.3* 3.3*   No results found for this basename: LIPASE, AMYLASE,  in the last 168 hours No results found for this basename: AMMONIA,  in the last 168 hours CBC:  Recent Labs Lab 02/14/13 1836 02/15/13 0400  WBC 6.3 5.6  NEUTROABS 5.3 3.5  HGB 11.4* 11.4*  HCT 33.5* 33.0*  MCV 86.6 86.2  PLT 196 204   Cardiac Enzymes: No results found for this basename: CKTOTAL, CKMB, CKMBINDEX, TROPONINI,  in the last 168 hours BNP: BNP (last 3 results) No results found for this basename: PROBNP,  in the last 8760 hours CBG: No results found for this basename: GLUCAP,  in the last 168 hours     Signed:  Jeralyn Bennett  [facility] 02/16/2013, 8:36 AM

## 2013-02-16 NOTE — Progress Notes (Signed)
Physical Therapy Treatment Patient Details Name: Rebecca Novak MRN: [id number] DOB: Feb 03, 1946 Today's Date: 02/16/2013 Time: 4098-1191 PT Time Calculation (min): 14 min  PT Assessment / Plan / Recommendation  History of Present Illness Pt is a 67 yo female with 2 day h/o N/V and has not had bowel movement for 2 weeks per report.  Pt fell at home Sunday night and c/o LE weakness.  MRI and CT were both negative.     PT Comments   All goals met, patient independent, will be safe for dc home. Educated on techniques to ensure safety with mobility and car transfers. Will d/c from acute PT, pt in agreement.           Equipment Recommendations  None recommended by PT    Recommendations for Other Services    Frequency Min 3X/week   Progress towards PT Goals Progress towards PT goals: Goals met/education completed, patient discharged from PT  Plan Current plan remains appropriate    Precautions / Restrictions Precautions Precautions: Fall Precaution Comments: pt reports this is her only fall  Restrictions Weight Bearing Restrictions: No   Pertinent Vitals/Pain NAD    Mobility  Bed Mobility Bed Mobility: Supine to Sit;Sitting - Scoot to Edge of Bed;Sit to Supine Supine to Sit: 7: Independent;HOB flat Sitting - Scoot to Edge of Bed: 7: Independent Transfers Transfers: Sit to Stand;Stand to [employer] to Stand: 7: Independent Stand to Sit: 7: Independent Details for Transfer Assistance: Instructed pt to take her time and continue to call for assist to walk to bathroom in wake of events of last 24 hours.  Pt states she feels like she is at her baseline.  Pt did move slowly and deliberately. Ambulation/Gait Ambulation/Gait Assistance: 7: Independent Ambulation Distance (Feet): 125 Feet Assistive device: None Gait Pattern: Decreased step length - right;Decreased step length - left General Gait Details: improved stability today Stairs: Yes Stairs Assistance: 7: Independent Stair  Management Technique: One rail Right;Forwards Number of Stairs: 6 Wheelchair Mobility Wheelchair Mobility: No      PT Goals (current goals can now be found in the care plan section) Acute Rehab PT Goals Patient Stated Goal: pt. ready to go home PT Goal Formulation: With patient Time For Goal Achievement: 02/22/13 Potential to Achieve Goals: Good  Visit Information  Last PT Received On: 02/16/13 Assistance Needed: +1 History of Present Illness: Pt is a 67 yo female with 2 day h/o N/V and has not had bowel movement for 2 weeks per report.  Pt fell at home Sunday night and c/o LE weakness.  MRI and CT were both negative.      Subjective Data  Subjective: I feel so much better Patient Stated Goal: pt. ready to go home   Cognition  Cognition Arousal/Alertness: Awake/alert Behavior During Therapy: WFL for tasks assessed/performed Overall Cognitive Status: Within Functional Limits for tasks assessed    Balance  Balance Balance Assessed: Yes Static Standing Balance Static Standing - Balance Support: During functional activity Static Standing - Level of Assistance: 7: Independent Static Standing - Comment/# of Minutes: 3 minutes Dynamic Standing Balance Dynamic Standing - Balance Support: During functional activity;No upper extremity supported Dynamic Standing - Level of Assistance: 7: Independent  End of Session PT - End of Session Equipment Utilized During Treatment: Gait belt Activity Tolerance: Patient tolerated treatment well Patient left: in chair;with call bell/phone within reach Nurse Communication: Mobility status   GP Functional Assessment Tool Used: clinical judgement Functional Limitation: Mobility: Walking and moving around Mobility:  Walking and Moving Around Current Status [phone]): At least 1 percent but less than 20 percent impaired, limited or restricted Mobility: Walking and Moving Around Goal Status [phone]): At least 1 percent but less than 20 percent  impaired, limited or restricted Mobility: Walking and Moving Around Discharge Status [phone]): At least 1 percent but less than 20 percent impaired, limited or restricted   Fabio Asa 02/16/2013, 10:32 AM Charlotte Crumb, PT DPT  [phone]

## 2013-02-16 NOTE — Progress Notes (Signed)
Physical Therapy Discharge Patient Details Name: Rebecca Novak MRN: [id number] DOB: 10/24/1945 Today's Date: 02/16/2013 Time: 4098-1191 PT Time Calculation (min): 14 min  Patient discharged from PT services secondary to goals met and no further PT needs identified.  Please see latest therapy progress note for current level of functioning and progress toward goals.    Progress and discharge plan discussed with patient and/or caregiver: Patient/Caregiver agrees with plan  GP Functional Assessment Tool Used: clinical judgement Functional Limitation: Mobility: Walking and moving around Mobility: Walking and Moving Around Current Status ([id number]): At least 1 percent but less than 20 percent impaired, limited or restricted Mobility: Walking and Moving Around Goal Status [phone]): At least 1 percent but less than 20 percent impaired, limited or restricted Mobility: Walking and Moving Around Discharge Status [phone]): At least 1 percent but less than 20 percent impaired, limited or restricted   Leola Brazil, PT DPT  [phone]  02/16/2013, 10:34 AM

## 2013-02-17 ENCOUNTER — Telehealth: Payer: Self-pay | Admitting: *Deleted

## 2013-02-17 DIAGNOSIS — D045 Carcinoma in situ of skin of trunk: Secondary | ICD-10-CM | POA: Diagnosis not present

## 2013-02-17 DIAGNOSIS — C44711 Basal cell carcinoma of skin of unspecified lower limb, including hip: Secondary | ICD-10-CM | POA: Diagnosis not present

## 2013-02-17 NOTE — Telephone Encounter (Signed)
Transitional care  Admit date:02/14/2013 Discharge date: 02/16/2013  Discharge diagnoses: Principal problem   Weakness of both legs Active problems:   Hypothyroidism   Hyperlipidemia   Nausea and vomiting   Orthostatic hypotension  Discharge condition :stable  Pt had fainted in her bathroom at home.  Had nausea and vomiting since then and went to hospital complaining of generalized weakness. I talked with patient today and she states she feels better and what ever she had "has passed".  No more weakness nor vomiting. States she had orthostatic hypotension and that may have been what caused her to "faint", although she later stated that the episode started after she had taken milk of mag for constipation. [place]  when I talked with her on the phone.  She was unable to come in to see dr Amador Cunas until [place], because they were leaving for the mountains in am and not returning until [place].  Encouraged pt to wait and see dr Kirtland Bouchard before going,but stated she couldn't and she now feels fine.  Patient has a post hospital follow up with dr Amador Cunas on October 7 at 10 am

## 2013-02-17 NOTE — Telephone Encounter (Signed)
Talked with pt for transitional care and she was discharged from hospital on 9/24 with principal diagnoses of weakness of legs after "fainting:" and nausea and vomiting.  She and her husband are leaving in am for the mountains and I could not persuade her to come for follow up ov.  Appointment made on October 7 when she returns.    FYI

## 2013-02-17 NOTE — ED Provider Notes (Signed)
Medical screening examination/treatment/procedure(s) were performed by non-physician practitioner and as supervising physician I was immediately available for consultation/collaboration.  Raeford Razor, MD 02/17/13 [phone]

## 2013-03-01 ENCOUNTER — Encounter: Payer: Self-pay | Admitting: Internal Medicine

## 2013-03-01 ENCOUNTER — Ambulatory Visit (INDEPENDENT_AMBULATORY_CARE_PROVIDER_SITE_OTHER): Payer: Medicare Other | Admitting: Internal Medicine

## 2013-03-01 ENCOUNTER — Telehealth: Payer: Self-pay | Admitting: Internal Medicine

## 2013-03-01 VITALS — BP 110/70 | HR 81 | Temp 98.0°F | Resp 20 | Wt 183.0 lb

## 2013-03-01 DIAGNOSIS — R29898 Other symptoms and signs involving the musculoskeletal system: Secondary | ICD-10-CM

## 2013-03-01 DIAGNOSIS — E785 Hyperlipidemia, unspecified: Secondary | ICD-10-CM | POA: Diagnosis not present

## 2013-03-01 DIAGNOSIS — I951 Orthostatic hypotension: Secondary | ICD-10-CM

## 2013-03-01 NOTE — Telephone Encounter (Signed)
Ok to d/c.

## 2013-03-01 NOTE — Patient Instructions (Signed)
It is important that you exercise regularly, at least 20 minutes 3 to 4 times per week.  If you develop chest pain or shortness of breath seek  medical attention.  Return in one year for follow-up   

## 2013-03-01 NOTE — Telephone Encounter (Signed)
Pt was told by hospital personnel to dc her multi vitamin and pt would like to know if dr Kirtland Bouchard agrees w/ that. pls advise

## 2013-03-01 NOTE — Progress Notes (Signed)
  Subjective:    Patient ID: Rebecca Novak, female    DOB: February 25, 1946, 67 y.o.   MRN: [id number]  HPI  Wt Readings from Last 3 Encounters:  03/01/13 183 lb (83.008 kg)  02/15/13 192 lb (87.091 kg)  12/22/12 198 lb (89.812 kg)    Review of Systems     Objective:   Physical Exam        Assessment & Plan:

## 2013-03-01 NOTE — Telephone Encounter (Signed)
Spoke to pt told her okay to discontinue her Multi Vit. Pt verbalized understanding.

## 2013-03-01 NOTE — Telephone Encounter (Signed)
Left message on voicemail to call office. Pt can d/c her multi vitamin.

## 2013-03-01 NOTE — Progress Notes (Signed)
Subjective:    Patient ID: Rebecca Novak, female    DOB: 1946-01-13, 67 y.o.   MRN: [id number]  HPI  67 year old patient who is seen today following a post hospital discharge. She was admitted for evaluation of weakness. She became weak and fell in her bathroom without loss of consciousness or major trauma. She had been vomiting for a few days. She had an extensive inpatient evaluation and was felt her symptoms resulted from orthostasis. Since her discharge she has done quite well and feels terrific. She has had a voluntary weight loss since July and has been swimming regularly. She is joined a Psychologist, forensic.  Hospital records reviewed  Stable medical problems include dyslipidemia and hypothyroidism. She is followed by psychiatry for bipolar disorder  Past Medical History  Diagnosis Date  . ANEMIA, MILD 05/17/2008  . COLONIC POLYPS, HX OF 05/17/2007  . GLUCOSE INTOLERANCE 11/23/2007  . HAND PAIN 07/31/2008  . HYPERLIPIDEMIA 02/05/2007  . HYPOTHYROIDISM 02/05/2007  . SYSTOLIC MURMUR 05/17/2007  . Depression     bipolar  . Skin cancer     "scraped off chest and RLE" (02/15/2013)    History   Social History  . Marital Status: Single    Spouse Name: N/A    Number of Children: N/A  . Years of Education: N/A   Occupational History  . Not on file.   Social History Main Topics  . Smoking status: Former Smoker -- 0.33 packs/day for 44 years    Types: Cigarettes  . Smokeless tobacco: Never Used     Comment: 02/15/2013 "stopped smoking cigarettes ~ 7 yr ago"  . Alcohol Use: No  . Drug Use: No  . Sexual Activity: Yes   Other Topics Concern  . Not on file   Social History Narrative  . No narrative on file    Past Surgical History  Procedure Laterality Date  . Appendectomy    . Tonsillectomy    . Vaginal hysterectomy      No family history on file.  No Known Allergies  Current Outpatient Prescriptions on File Prior to Visit  Medication Sig Dispense Refill  . aspirin  81 MG tablet Take 81 mg by mouth daily.        . busPIRone (BUSPAR) 15 MG tablet Take 15 mg by mouth 2 (two) times daily.       . Calcium Carbonate (CALCIUM 500 PO) Take 2 tablets by mouth daily.        . Carbamazepine (EQUETRO) 300 MG CP12 Take 4 capsules by mouth daily. One capsule [300 mg] at 11:30 AM, one capsule [300 mg] at 7:30 PM, and two capsules [600 mg] at 11:30 PM.      . levothyroxine (SYNTHROID) 75 MCG tablet Take 1 tablet (75 mcg total) by mouth daily.  90 tablet  3  . perphenazine (TRILAFON) 2 MG tablet Take 1 tablet (2 mg total) by mouth every morning.  30 tablet  0  . QUEtiapine (SEROQUEL) 400 MG tablet Take 1,200 mg by mouth at bedtime.       . SF 5000 PLUS 1.1 % CREA dental cream Place 1 application onto teeth at bedtime.       . simvastatin (ZOCOR) 40 MG tablet Take 1 tablet (40 mg total) by mouth at bedtime.  90 tablet  3   No current facility-administered medications on file prior to visit.    BP 110/70  Pulse 81  Temp(Src) 98 F (36.7 C) (Oral)  Resp 20  Wt  183 lb (83.008 kg)  BMI 27.83 kg/m2  SpO2 98%       Review of Systems  Constitutional: Negative.   HENT: Negative for hearing loss, congestion, sore throat, rhinorrhea, dental problem, sinus pressure and tinnitus.   Eyes: Negative for pain, discharge and visual disturbance.  Respiratory: Negative for cough and shortness of breath.   Cardiovascular: Negative for chest pain, palpitations and leg swelling.  Gastrointestinal: Negative for nausea, vomiting, abdominal pain, diarrhea, constipation, blood in stool and abdominal distention.  Genitourinary: Negative for dysuria, urgency, frequency, hematuria, flank pain, vaginal bleeding, vaginal discharge, difficulty urinating, vaginal pain and pelvic pain.  Musculoskeletal: Negative for joint swelling, arthralgias and gait problem.  Skin: Negative for rash.  Neurological: Negative for dizziness, syncope, speech difficulty, weakness, numbness and headaches.   Hematological: Negative for adenopathy.  Psychiatric/Behavioral: Negative for behavioral problems, dysphoric mood and agitation. The patient is not nervous/anxious.        Objective:   Physical Exam  Constitutional: She is oriented to person, place, and time. She appears well-developed and well-nourished.  HENT:  Head: Normocephalic.  Right Ear: External ear normal.  Left Ear: External ear normal.  Mouth/Throat: Oropharynx is clear and moist.  Eyes: Conjunctivae and EOM are normal. Pupils are equal, round, and reactive to light.  Neck: Normal range of motion. Neck supple. No thyromegaly present.  Cardiovascular: Normal rate, regular rhythm, normal heart sounds and intact distal pulses.   Pulmonary/Chest: Effort normal and breath sounds normal.  Abdominal: Soft. Bowel sounds are normal. She exhibits no mass. There is no tenderness.  Musculoskeletal: Normal range of motion.  Lymphadenopathy:    She has no cervical adenopathy.  Neurological: She is alert and oriented to person, place, and time.  Skin: Skin is warm and dry. No rash noted.  Psychiatric: She has a normal mood and affect. Her behavior is normal.          Assessment & Plan:   History of orthostatic hypotension resolved Dyslipidemia Hypothyroidism   CPX 12 months Continue regular exercise regimen

## 2013-03-03 DIAGNOSIS — L089 Local infection of the skin and subcutaneous tissue, unspecified: Secondary | ICD-10-CM | POA: Diagnosis not present

## 2013-03-06 ENCOUNTER — Encounter: Payer: Self-pay | Admitting: Internal Medicine

## 2013-03-18 ENCOUNTER — Ambulatory Visit (INDEPENDENT_AMBULATORY_CARE_PROVIDER_SITE_OTHER): Payer: Medicare Other

## 2013-03-18 ENCOUNTER — Ambulatory Visit: Payer: Medicare Other

## 2013-03-18 DIAGNOSIS — Z23 Encounter for immunization: Secondary | ICD-10-CM

## 2013-03-21 ENCOUNTER — Encounter: Payer: Self-pay | Admitting: Internal Medicine

## 2013-03-21 ENCOUNTER — Other Ambulatory Visit: Payer: Self-pay | Admitting: Internal Medicine

## 2013-03-21 DIAGNOSIS — Z1231 Encounter for screening mammogram for malignant neoplasm of breast: Secondary | ICD-10-CM

## 2013-03-21 DIAGNOSIS — F3173 Bipolar disorder, in partial remission, most recent episode manic: Secondary | ICD-10-CM | POA: Diagnosis not present

## 2013-03-22 ENCOUNTER — Ambulatory Visit: Payer: Medicare Other

## 2013-04-13 ENCOUNTER — Ambulatory Visit (HOSPITAL_COMMUNITY)
Admission: RE | Admit: 2013-04-13 | Discharge: 2013-04-13 | Disposition: A | Discharge status: Home / Self Care | Payer: Medicare Other | Source: Ambulatory Visit | Attending: Internal Medicine | Admitting: Internal Medicine

## 2013-04-13 DIAGNOSIS — Z1231 Encounter for screening mammogram for malignant neoplasm of breast: Secondary | ICD-10-CM | POA: Diagnosis not present

## 2013-05-11 DIAGNOSIS — Z124 Encounter for screening for malignant neoplasm of cervix: Secondary | ICD-10-CM | POA: Diagnosis not present

## 2013-05-11 DIAGNOSIS — E559 Vitamin D deficiency, unspecified: Secondary | ICD-10-CM | POA: Diagnosis not present

## 2013-05-11 DIAGNOSIS — Z01419 Encounter for gynecological examination (general) (routine) without abnormal findings: Secondary | ICD-10-CM | POA: Diagnosis not present

## 2013-06-06 DIAGNOSIS — F3173 Bipolar disorder, in partial remission, most recent episode manic: Secondary | ICD-10-CM | POA: Diagnosis not present

## 2013-06-14 ENCOUNTER — Other Ambulatory Visit (HOSPITAL_COMMUNITY): Payer: Self-pay | Admitting: Obstetrics

## 2013-06-14 DIAGNOSIS — E559 Vitamin D deficiency, unspecified: Secondary | ICD-10-CM

## 2013-06-14 DIAGNOSIS — Z01419 Encounter for gynecological examination (general) (routine) without abnormal findings: Secondary | ICD-10-CM

## 2013-06-14 DIAGNOSIS — N951 Menopausal and female climacteric states: Secondary | ICD-10-CM

## 2013-07-04 DIAGNOSIS — L570 Actinic keratosis: Secondary | ICD-10-CM | POA: Diagnosis not present

## 2013-07-04 DIAGNOSIS — D239 Other benign neoplasm of skin, unspecified: Secondary | ICD-10-CM | POA: Diagnosis not present

## 2013-08-23 ENCOUNTER — Other Ambulatory Visit: Payer: Self-pay | Admitting: Internal Medicine

## 2013-08-24 ENCOUNTER — Other Ambulatory Visit: Payer: Self-pay | Admitting: *Deleted

## 2013-08-24 DIAGNOSIS — L570 Actinic keratosis: Secondary | ICD-10-CM | POA: Diagnosis not present

## 2013-08-24 DIAGNOSIS — D485 Neoplasm of uncertain behavior of skin: Secondary | ICD-10-CM | POA: Diagnosis not present

## 2013-08-24 MED ORDER — SIMVASTATIN 40 MG PO TABS: 40.0000 mg | ORAL_TABLET | Freq: Every day | ORAL | Status: DC

## 2013-08-31 DIAGNOSIS — F3173 Bipolar disorder, in partial remission, most recent episode manic: Secondary | ICD-10-CM | POA: Diagnosis not present

## 2013-09-27 DIAGNOSIS — L570 Actinic keratosis: Secondary | ICD-10-CM | POA: Diagnosis not present

## 2013-11-16 DIAGNOSIS — F3173 Bipolar disorder, in partial remission, most recent episode manic: Secondary | ICD-10-CM | POA: Diagnosis not present

## 2013-11-21 ENCOUNTER — Encounter: Payer: Self-pay | Admitting: Internal Medicine

## 2013-11-21 ENCOUNTER — Ambulatory Visit (INDEPENDENT_AMBULATORY_CARE_PROVIDER_SITE_OTHER): Payer: Medicare Other | Admitting: Internal Medicine

## 2013-11-21 VITALS — BP 148/82 | HR 69 | Temp 98.7°F | Resp 20 | Ht 68.0 in | Wt 184.0 lb

## 2013-11-21 DIAGNOSIS — L02419 Cutaneous abscess of limb, unspecified: Secondary | ICD-10-CM | POA: Diagnosis not present

## 2013-11-21 DIAGNOSIS — L03116 Cellulitis of left lower limb: Secondary | ICD-10-CM

## 2013-11-21 DIAGNOSIS — L03119 Cellulitis of unspecified part of limb: Secondary | ICD-10-CM

## 2013-11-21 MED ORDER — AMOXICILLIN-POT CLAVULANATE 875-125 MG PO TABS: 1.0000 | ORAL_TABLET | Freq: Two times a day (BID) | ORAL | Status: DC

## 2013-11-21 NOTE — Patient Instructions (Signed)
Cellulitis Cellulitis is an infection of the skin and the tissue beneath it. The infected area is usually red and tender. Cellulitis occurs most often in the arms and lower legs.  CAUSES  Cellulitis is caused by bacteria that enter the skin through cracks or cuts in the skin. The most common types of bacteria that cause cellulitis are Staphylococcus and Streptococcus. SYMPTOMS   Redness and warmth.  Swelling.  Tenderness or pain.  Fever. DIAGNOSIS  Your caregiver can usually determine what is wrong based on a physical exam. Blood tests may also be done. TREATMENT  Treatment usually involves taking an antibiotic medicine. HOME CARE INSTRUCTIONS   Take your antibiotics as directed. Finish them even if you start to feel better.  Keep the infected arm or leg elevated to reduce swelling.  Apply a warm cloth to the affected area up to 4 times per day to relieve pain.  Only take over-the-counter or prescription medicines for pain, discomfort, or fever as directed by your caregiver.  Keep all follow-up appointments as directed by your caregiver. SEEK MEDICAL CARE IF:   You notice red streaks coming from the infected area.  Your red area gets larger or turns dark in color.  Your bone or joint underneath the infected area becomes painful after the skin has healed.  Your infection returns in the same area or another area.  You notice a swollen bump in the infected area.  You develop new symptoms. SEEK IMMEDIATE MEDICAL CARE IF:   You have a fever.  You feel very sleepy.  You develop vomiting or diarrhea.  You have a general ill feeling (malaise) with muscle aches and pains. MAKE SURE YOU:   Understand these instructions.  Will watch your condition.  Will get help right away if you are not doing well or get worse. Document Released: 02/19/2005 Document Revised: 11/11/2011 Document Reviewed: 07/28/2011 [facility] Patient Information 2015 [place], [place]. This information is  not intended to replace advice given to you by your health care provider. Make sure you discuss any questions you have with your health care provider. it

## 2013-11-21 NOTE — Progress Notes (Signed)
Pre-visit discussion using our clinic review tool. No additional management support is needed unless otherwise documented below in the visit note.  
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
## 2013-12-07 DIAGNOSIS — L570 Actinic keratosis: Secondary | ICD-10-CM | POA: Diagnosis not present

## 2013-12-07 DIAGNOSIS — K130 Diseases of lips: Secondary | ICD-10-CM | POA: Diagnosis not present

## 2013-12-07 DIAGNOSIS — D239 Other benign neoplasm of skin, unspecified: Secondary | ICD-10-CM | POA: Diagnosis not present

## 2014-02-06 ENCOUNTER — Other Ambulatory Visit (INDEPENDENT_AMBULATORY_CARE_PROVIDER_SITE_OTHER): Payer: Medicare Other

## 2014-02-06 DIAGNOSIS — R300 Dysuria: Secondary | ICD-10-CM | POA: Diagnosis not present

## 2014-02-06 DIAGNOSIS — I10 Essential (primary) hypertension: Secondary | ICD-10-CM | POA: Diagnosis not present

## 2014-02-06 DIAGNOSIS — E785 Hyperlipidemia, unspecified: Secondary | ICD-10-CM

## 2014-02-06 DIAGNOSIS — D649 Anemia, unspecified: Secondary | ICD-10-CM

## 2014-02-06 DIAGNOSIS — E039 Hypothyroidism, unspecified: Secondary | ICD-10-CM

## 2014-02-06 LAB — CBC WITH DIFFERENTIAL/PLATELET
BASOS ABS: 0 10*3/uL (ref 0.0–0.1)
Basophils Relative: 1 % (ref 0.0–3.0)
Eosinophils Absolute: 0.1 10*3/uL (ref 0.0–0.7)
Eosinophils Relative: 3.8 % (ref 0.0–5.0)
HCT: 31.9 % — ABNORMAL LOW (ref 36.0–46.0)
HEMOGLOBIN: 10.6 g/dL — AB (ref 12.0–15.0)
Lymphocytes Relative: 39.7 % (ref 12.0–46.0)
Lymphs Abs: 1.5 10*3/uL (ref 0.7–4.0)
MCHC: 33.4 g/dL (ref 30.0–36.0)
MCV: 87.2 fl (ref 78.0–100.0)
MONOS PCT: 10.6 % (ref 3.0–12.0)
Monocytes Absolute: 0.4 10*3/uL (ref 0.1–1.0)
NEUTROS ABS: 1.7 10*3/uL (ref 1.4–7.7)
Neutrophils Relative %: 44.9 % (ref 43.0–77.0)
PLATELETS: 266 10*3/uL (ref 150.0–400.0)
RBC: 3.66 Mil/uL — ABNORMAL LOW (ref 3.87–5.11)
RDW: 15.1 % (ref 11.5–15.5)
WBC: 3.8 10*3/uL — ABNORMAL LOW (ref 4.0–10.5)

## 2014-02-06 LAB — BASIC METABOLIC PANEL
BUN: 16 mg/dL (ref 6–23)
CALCIUM: 8.8 mg/dL (ref 8.4–10.5)
CO2: 27 mEq/L (ref 19–32)
Chloride: 108 mEq/L (ref 96–112)
Creatinine, Ser: 0.8 mg/dL (ref 0.4–1.2)
GFR: 73.2 mL/min (ref >60.00)
GLUCOSE: 96 mg/dL (ref 70–99)
Potassium: 4.4 mEq/L (ref 3.5–5.1)
SODIUM: 142 meq/L (ref 135–145)

## 2014-02-06 LAB — POCT URINALYSIS DIPSTICK
BILIRUBIN UA: NEGATIVE
Blood, UA: NEGATIVE
GLUCOSE UA: NEGATIVE
Ketones, UA: NEGATIVE
Leukocytes, UA: NEGATIVE
Nitrite, UA: NEGATIVE
Protein, UA: NEGATIVE
SPEC GRAV UA: 1.015
Urobilinogen, UA: 0.2
pH, UA: 5

## 2014-02-06 LAB — LIPID PANEL
CHOL/HDL RATIO: 3
Cholesterol: 215 mg/dL — ABNORMAL HIGH (ref 0–200)
HDL: 78 mg/dL (ref >39.00)
LDL CALC: 120 mg/dL — AB (ref 0–99)
NonHDL: 137
TRIGLYCERIDES: 87 mg/dL (ref 0.0–149.0)
VLDL: 17.4 mg/dL (ref 0.0–40.0)

## 2014-02-06 LAB — TSH: TSH: 3.63 u[IU]/mL (ref 0.35–4.50)

## 2014-02-06 LAB — HEPATIC FUNCTION PANEL
ALT: 12 U/L (ref 0–35)
AST: 21 U/L (ref 0–37)
Albumin: 3.6 g/dL (ref 3.5–5.2)
Alkaline Phosphatase: 60 U/L (ref 39–117)
BILIRUBIN TOTAL: 0.3 mg/dL (ref 0.2–1.2)
Bilirubin, Direct: 0 mg/dL (ref 0.0–0.3)
Total Protein: 6.6 g/dL (ref 6.0–8.3)

## 2014-02-13 ENCOUNTER — Telehealth: Payer: Self-pay | Admitting: Internal Medicine

## 2014-02-13 ENCOUNTER — Ambulatory Visit (INDEPENDENT_AMBULATORY_CARE_PROVIDER_SITE_OTHER): Payer: Medicare Other | Admitting: Internal Medicine

## 2014-02-13 ENCOUNTER — Encounter: Payer: Self-pay | Admitting: Internal Medicine

## 2014-02-13 VITALS — BP 122/70 | HR 65 | Temp 98.1°F | Resp 20 | Ht 68.0 in | Wt 181.0 lb

## 2014-02-13 DIAGNOSIS — E039 Hypothyroidism, unspecified: Secondary | ICD-10-CM

## 2014-02-13 DIAGNOSIS — Z23 Encounter for immunization: Secondary | ICD-10-CM | POA: Diagnosis not present

## 2014-02-13 DIAGNOSIS — Z0000 Encounter for general adult medical examination without abnormal findings: Secondary | ICD-10-CM

## 2014-02-13 DIAGNOSIS — D649 Anemia, unspecified: Secondary | ICD-10-CM | POA: Diagnosis not present

## 2014-02-13 NOTE — Telephone Encounter (Signed)
Pt ask if Butch Penny could send her a copy of her labs

## 2014-02-13 NOTE — Patient Instructions (Signed)
Schedule your colonoscopy to help detect colon cancer.  Take a calcium supplement, plus [phone] units of vitamin D    It is important that you exercise regularly, at least 20 minutes 3 to 4 times per week.  If you develop chest pain or shortness of breath seek  medical attention.  Health Maintenance Adopting a healthy lifestyle and getting preventive care can go a long way to promote health and wellness. Talk with your health care provider about what schedule of regular examinations is right for you. This is a good chance for you to check in with your provider about disease prevention and staying healthy. In between checkups, there are plenty of things you can do on your own. Experts have done a lot of research about which lifestyle changes and preventive measures are most likely to keep you healthy. Ask your health care provider for more information. WEIGHT AND DIET  Eat a healthy diet  Be sure to include plenty of vegetables, fruits, low-fat dairy products, and lean protein.  Do not eat a lot of foods high in solid fats, added sugars, or salt.  Get regular exercise. This is one of the most important things you can do for your health.  Most adults should exercise for at least 150 minutes each week. The exercise should increase your heart rate and make you sweat (moderate-intensity exercise).  Most adults should also do strengthening exercises at least twice a week. This is in addition to the moderate-intensity exercise.  Maintain a healthy weight  Body mass index (BMI) is a measurement that can be used to identify possible weight problems. It estimates body fat based on height and weight. Your health care provider can help determine your BMI and help you achieve or maintain a healthy weight.  For females 18 years of age and older:   A BMI below 18.5 is considered underweight.  A BMI of 18.5 to 24.9 is normal.  A BMI of 25 to 29.9 is considered overweight.  A BMI of 30 and above  is considered obese.  Watch levels of cholesterol and blood lipids  You should start having your blood tested for lipids and cholesterol at 70 years of age, then have this test every 5 years.  You may need to have your cholesterol levels checked more often if:  Your lipid or cholesterol levels are high.  You are older than 70 years of age.  You are at high risk for heart disease.  CANCER SCREENING   Lung Cancer  Lung cancer screening is recommended for adults 57-73 years old who are at high risk for lung cancer because of a history of smoking.  A yearly low-dose CT scan of the lungs is recommended for people who:  Currently smoke.  Have quit within the past 15 years.  Have at least a 30-pack-year history of smoking. A pack year is smoking an average of one pack of cigarettes a day for 1 year.  Yearly screening should continue until it has been 15 years since you quit.  Yearly screening should stop if you develop a health problem that would prevent you from having lung cancer treatment.  Breast Cancer  Practice breast self-awareness. This means understanding how your breasts normally appear and feel.  It also means doing regular breast self-exams. Let your health care provider know about any changes, no matter how small.  If you are in your 20s or 30s, you should have a clinical breast exam (CBE) by a health care provider  every 1-3 years as part of a regular health exam.  If you are 40 or older, have a CBE every year. Also consider having a breast X-ray (mammogram) every year.  If you have a family history of breast cancer, talk to your health care provider about genetic screening.  If you are at high risk for breast cancer, talk to your health care provider about having an MRI and a mammogram every year.  Breast cancer gene (BRCA) assessment is recommended for women who have family members with BRCA-related cancers. BRCA-related cancers  include:  Breast.  Ovarian.  Tubal.  Peritoneal cancers.  Results of the assessment will determine the need for genetic counseling and BRCA1 and BRCA2 testing. Cervical Cancer Routine pelvic examinations to screen for cervical cancer are no longer recommended for nonpregnant women who are considered low risk for cancer of the pelvic organs (ovaries, uterus, and vagina) and who do not have symptoms. A pelvic examination may be necessary if you have symptoms including those associated with pelvic infections. Ask your health care provider if a screening pelvic exam is right for you.   The Pap test is the screening test for cervical cancer for women who are considered at risk.  If you had a hysterectomy for a problem that was not cancer or a condition that could lead to cancer, then you no longer need Pap tests.  If you are older than 65 years, and you have had normal Pap tests for the past 10 years, you no longer need to have Pap tests.  If you have had past treatment for cervical cancer or a condition that could lead to cancer, you need Pap tests and screening for cancer for at least 20 years after your treatment.  If you no longer get a Pap test, assess your risk factors if they change (such as having a new sexual partner). This can affect whether you should start being screened again.  Some women have medical problems that increase their chance of getting cervical cancer. If this is the case for you, your health care provider may recommend more frequent screening and Pap tests.  The human papillomavirus (HPV) test is another test that may be used for cervical cancer screening. The HPV test looks for the virus that can cause cell changes in the cervix. The cells collected during the Pap test can be tested for HPV.  The HPV test can be used to screen women 30 years of age and older. Getting tested for HPV can extend the interval between normal Pap tests from three to five years.  An HPV  test also should be used to screen women of any age who have unclear Pap test results.  After 70 years of age, women should have HPV testing as often as Pap tests.  Colorectal Cancer  This type of cancer can be detected and often prevented.  Routine colorectal cancer screening usually begins at 70 years of age and continues through 70 years of age.  Your health care provider may recommend screening at an earlier age if you have risk factors for colon cancer.  Your health care provider may also recommend using home test kits to check for hidden blood in the stool.  A small camera at the end of a tube can be used to examine your colon directly (sigmoidoscopy or colonoscopy). This is done to check for the earliest forms of colorectal cancer.  Routine screening usually begins at age 50.  Direct examination of the colon should   be repeated every 5-10 years through 70 years of age. However, you may need to be screened more often if early forms of precancerous polyps or small growths are found. Skin Cancer  Check your skin from head to toe regularly.  Tell your health care provider about any new moles or changes in moles, especially if there is a change in a mole's shape or color.  Also tell your health care provider if you have a mole that is larger than the size of a pencil eraser.  Always use sunscreen. Apply sunscreen liberally and repeatedly throughout the day.  Protect yourself by wearing long sleeves, pants, a wide-brimmed hat, and sunglasses whenever you are outside. HEART DISEASE, DIABETES, AND HIGH BLOOD PRESSURE   Have your blood pressure checked at least every 1-2 years. High blood pressure causes heart disease and increases the risk of stroke.  If you are between 55 years and 79 years old, ask your health care provider if you should take aspirin to prevent strokes.  Have regular diabetes screenings. This involves taking a blood sample to check your fasting blood sugar  level.  If you are at a normal weight and have a low risk for diabetes, have this test once every three years after 70 years of age.  If you are overweight and have a high risk for diabetes, consider being tested at a younger age or more often. PREVENTING INFECTION  Hepatitis B  If you have a higher risk for hepatitis B, you should be screened for this virus. You are considered at high risk for hepatitis B if:  You were born in a country where hepatitis B is common. Ask your health care provider which countries are considered high risk.  Your parents were born in a high-risk country, and you have not been immunized against hepatitis B (hepatitis B vaccine).  You have HIV or AIDS.  You use needles to inject street drugs.  You live with someone who has hepatitis B.  You have had sex with someone who has hepatitis B.  You get hemodialysis treatment.  You take certain medicines for conditions, including cancer, organ transplantation, and autoimmune conditions. Hepatitis C  Blood testing is recommended for:  Everyone born from 1945 through 1965.  Anyone with known risk factors for hepatitis C. Sexually transmitted infections (STIs)  You should be screened for sexually transmitted infections (STIs) including gonorrhea and chlamydia if:  You are sexually active and are younger than 70 years of age.  You are older than 70 years of age and your health care provider tells you that you are at risk for this type of infection.  Your sexual activity has changed since you were last screened and you are at an increased risk for chlamydia or gonorrhea. Ask your health care provider if you are at risk.  If you do not have HIV, but are at risk, it may be recommended that you take a prescription medicine daily to prevent HIV infection. This is called pre-exposure prophylaxis (PrEP). You are considered at risk if:  You are sexually active and do not regularly use condoms or know the HIV status  of your partner(s).  You take drugs by injection.  You are sexually active with a partner who has HIV. Talk with your health care provider about whether you are at high risk of being infected with HIV. If you choose to begin PrEP, you should first be tested for HIV. You should then be tested every 3 months for as   long as you are taking PrEP.  PREGNANCY   If you are premenopausal and you may become pregnant, ask your health care provider about preconception counseling.  If you may become pregnant, take 400 to 800 micrograms (mcg) of folic acid every day.  If you want to prevent pregnancy, talk to your health care provider about birth control (contraception). OSTEOPOROSIS AND MENOPAUSE   Osteoporosis is a disease in which the bones lose minerals and strength with aging. This can result in serious bone fractures. Your risk for osteoporosis can be identified using a bone density scan.  If you are 30 years of age or older, or if you are at risk for osteoporosis and fractures, ask your health care provider if you should be screened.  Ask your health care provider whether you should take a calcium or vitamin D supplement to lower your risk for osteoporosis.  Menopause may have certain physical symptoms and risks.  Hormone replacement therapy may reduce some of these symptoms and risks. Talk to your health care provider about whether hormone replacement therapy is right for you.  HOME CARE INSTRUCTIONS   Schedule regular health, dental, and eye exams.  Stay current with your immunizations.   Do not use any tobacco products including cigarettes, chewing tobacco, or electronic cigarettes.  If you are pregnant, do not drink alcohol.  If you are breastfeeding, limit how much and how often you drink alcohol.  Limit alcohol intake to no more than 1 drink per day for nonpregnant women. One drink equals 12 ounces of beer, 5 ounces of wine, or 1 ounces of hard liquor.  Do not use street  drugs.  Do not share needles.  Ask your health care provider for help if you need support or information about quitting drugs.  Tell your health care provider if you often feel depressed.  Tell your health care provider if you have ever been abused or do not feel safe at home. Document Released: 11/25/2010 Document Revised: 09/26/2013 Document Reviewed: 04/13/2013 [facility] Patient Information 2015 [place], [place]. This information is not intended to replace advice given to you by your health care provider. Make sure you discuss any questions you have with your health care provider. Impingement Syndrome, Rotator Cuff, Bursitis with Rehab Impingement syndrome is a condition that involves inflammation of the tendons of the rotator cuff and the subacromial bursa, that causes pain in the shoulder. The rotator cuff consists of four tendons and muscles that control much of the shoulder and upper arm function. The subacromial bursa is a fluid filled sac that helps reduce friction between the rotator cuff and one of the bones of the shoulder (acromion). Impingement syndrome is usually an overuse injury that causes swelling of the bursa (bursitis), swelling of the tendon (tendonitis), and/or a tear of the tendon (strain). Strains are classified into three categories. Grade 1 strains cause pain, but the tendon is not lengthened. Grade 2 strains include a lengthened ligament, due to the ligament being stretched or partially ruptured. With grade 2 strains there is still function, although the function may be decreased. Grade 3 strains include a complete tear of the tendon or muscle, and function is usually impaired. SYMPTOMS   Pain around the shoulder, often at the outer portion of the upper arm.  Pain that gets worse with shoulder function, especially when reaching overhead or lifting.  Sometimes, aching when not using the arm.  Pain that wakes you up at night.  Sometimes, tenderness, swelling, warmth,  or redness over  the affected area.  Loss of strength.  Limited motion of the shoulder, especially reaching behind the back (to the back pocket or to unhook bra) or across your body.  Crackling sound (crepitation) when moving the arm.  Biceps tendon pain and inflammation (in the front of the shoulder). Worse when bending the elbow or lifting. CAUSES  Impingement syndrome is often an overuse injury, in which chronic (repetitive) motions cause the tendons or bursa to become inflamed. A strain occurs when a force is paced on the tendon or muscle that is greater than it can withstand. Common mechanisms of injury include: Stress from sudden increase in duration, frequency, or intensity of training.  Direct hit (trauma) to the shoulder.  Aging, erosion of the tendon with normal use.  Bony bump on shoulder (acromial spur). RISK INCREASES WITH:  Contact sports (football, wrestling, boxing).  Throwing sports (baseball, tennis, volleyball).  Weightlifting and bodybuilding.  Heavy labor.  Previous injury to the rotator cuff, including impingement.  Poor shoulder strength and flexibility.  Failure to warm up properly before activity.  Inadequate protective equipment.  Old age.  Bony bump on shoulder (acromial spur). PREVENTION   Warm up and stretch properly before activity.  Allow for adequate recovery between workouts.  Maintain physical fitness:  Strength, flexibility, and endurance.  Cardiovascular fitness.  Learn and use proper exercise technique. PROGNOSIS  If treated properly, impingement syndrome usually goes away within 6 weeks. Sometimes surgery is required.  RELATED COMPLICATIONS   Longer healing time if not properly treated, or if not given enough time to heal.  Recurring symptoms, that result in a chronic condition.  Shoulder stiffness, frozen shoulder, or loss of motion.  Rotator cuff tendon tear.  Recurring symptoms, especially if activity is resumed  too soon, with overuse, with a direct blow, or when using poor technique. TREATMENT  Treatment first involves the use of ice and medicine, to reduce pain and inflammation. The use of strengthening and stretching exercises may help reduce pain with activity. These exercises may be performed at home or with a therapist. If non-surgical treatment is unsuccessful after more than 6 months, surgery may be advised. After surgery and rehabilitation, activity is usually possible in 3 months.  MEDICATION  If pain medicine is needed, nonsteroidal anti-inflammatory medicines (aspirin and ibuprofen), or other minor pain relievers (acetaminophen), are often advised.  Do not take pain medicine for 7 days before surgery.  Prescription pain relievers may be given, if your caregiver thinks they are needed. Use only as directed and only as much as you need.  Corticosteroid injections may be given by your caregiver. These injections should be reserved for the most serious cases, because they may only be given a certain number of times. HEAT AND COLD  Cold treatment (icing) should be applied for 10 to 15 minutes every 2 to 3 hours for inflammation and pain, and immediately after activity that aggravates your symptoms. Use ice packs or an ice massage.  Heat treatment may be used before performing stretching and strengthening activities prescribed by your caregiver, physical therapist, or athletic trainer. Use a heat pack or a warm water soak. SEEK MEDICAL CARE IF:   Symptoms get worse or do not improve in 4 to 6 weeks, despite treatment.  New, unexplained symptoms develop. (Drugs used in treatment may produce side effects.) EXERCISES  RANGE OF MOTION (ROM) AND STRETCHING EXERCISES - Impingement Syndrome (Rotator Cuff  Tendinitis, Bursitis) These exercises may help you when beginning to rehabilitate your injury. Your  symptoms may go away with or without further involvement from your physician, physical therapist  or athletic trainer. While completing these exercises, remember:   Restoring tissue flexibility helps normal motion to return to the joints. This allows healthier, less painful movement and activity.  An effective stretch should be held for at least 30 seconds.  A stretch should never be painful. You should only feel a gentle lengthening or release in the stretched tissue. STRETCH - Flexion, Standing  Stand with good posture. With an underhand grip on your right / left hand, and an overhand grip on the opposite hand, grasp a broomstick or cane so that your hands are a little more than shoulder width apart.  Keeping your right / left elbow straight and shoulder muscles relaxed, push the stick with your opposite hand, to raise your right / left arm in front of your body and then overhead. Raise your arm until you feel a stretch in your right / left shoulder, but before you have increased shoulder pain.  Try to avoid shrugging your right / left shoulder as your arm rises, by keeping your shoulder blade tucked down and toward your mid-back spine. Hold for __________ seconds.  Slowly return to the starting position. Repeat __________ times. Complete this exercise __________ times per day. STRETCH - Abduction, Supine  Lie on your back. With an underhand grip on your right / left hand and an overhand grip on the opposite hand, grasp a broomstick or cane so that your hands are a little more than shoulder width apart.  Keeping your right / left elbow straight and your shoulder muscles relaxed, push the stick with your opposite hand, to raise your right / left arm out to the side of your body and then overhead. Raise your arm until you feel a stretch in your right / left shoulder, but before you have increased shoulder pain.  Try to avoid shrugging your right / left shoulder as your arm rises, by keeping your shoulder blade tucked down and toward your mid-back spine. Hold for __________  seconds.  Slowly return to the starting position. Repeat __________ times. Complete this exercise __________ times per day. ROM - Flexion, Active-Assisted  Lie on your back. You may bend your knees for comfort.  Grasp a broomstick or cane so your hands are about shoulder width apart. Your right / left hand should grip the end of the stick, so that your hand is positioned "thumbs-up," as if you were about to shake hands.  Using your healthy arm to lead, raise your right / left arm overhead, until you feel a gentle stretch in your shoulder. Hold for __________ seconds.  Use the stick to assist in returning your right / left arm to its starting position. Repeat __________ times. Complete this exercise __________ times per day.  ROM - Internal Rotation, Supine   Lie on your back on a firm surface. Place your right / left elbow about 60 degrees away from your side. Elevate your elbow with a folded towel, so that the elbow and shoulder are the same height.  Using a broomstick or cane and your strong arm, pull your right / left hand toward your body until you feel a gentle stretch, but no increase in your shoulder pain. Keep your shoulder and elbow in place throughout the exercise.  Hold for __________ seconds. Slowly return to the starting position. Repeat __________ times. Complete this exercise __________ times per day. STRETCH - Internal Rotation  Place your right /  left hand behind your back, palm up.  Throw a towel or belt over your opposite shoulder. Grasp the towel with your right / left hand.  While keeping an upright posture, gently pull up on the towel, until you feel a stretch in the front of your right / left shoulder.  Avoid shrugging your right / left shoulder as your arm rises, by keeping your shoulder blade tucked down and toward your mid-back spine.  Hold for __________ seconds. Release the stretch, by lowering your healthy hand. Repeat __________ times. Complete this  exercise __________ times per day. ROM - Internal Rotation   Using an underhand grip, grasp a stick behind your back with both hands.  While standing upright with good posture, slide the stick up your back until you feel a mild stretch in the front of your shoulder.  Hold for __________ seconds. Slowly return to your starting position. Repeat __________ times. Complete this exercise __________ times per day.  STRETCH - Posterior Shoulder Capsule   Stand or sit with good posture. Grasp your right / left elbow and draw it across your chest, keeping it at the same height as your shoulder.  Pull your elbow, so your upper arm comes in closer to your chest. Pull until you feel a gentle stretch in the back of your shoulder.  Hold for __________ seconds. Repeat __________ times. Complete this exercise __________ times per day. STRENGTHENING EXERCISES - Impingement Syndrome (Rotator Cuff Tendinitis, Bursitis) These exercises may help you when beginning to rehabilitate your injury. They may resolve your symptoms with or without further involvement from your physician, physical therapist or athletic trainer. While completing these exercises, remember:  Muscles can gain both the endurance and the strength needed for everyday activities through controlled exercises.  Complete these exercises as instructed by your physician, physical therapist or athletic trainer. Increase the resistance and repetitions only as guided.  You may experience muscle soreness or fatigue, but the pain or discomfort you are trying to eliminate should never worsen during these exercises. If this pain does get worse, stop and make sure you are following the directions exactly. If the pain is still present after adjustments, discontinue the exercise until you can discuss the trouble with your clinician.  During your recovery, avoid activity or exercises which involve actions that place your injured hand or elbow above your head or  behind your back or head. These positions stress the tissues which you are trying to heal. STRENGTH - Scapular Depression and Adduction   With good posture, sit on a firm chair. Support your arms in front of you, with pillows, arm rests, or on a table top. Have your elbows in line with the sides of your body.  Gently draw your shoulder blades down and toward your mid-back spine. Gradually increase the tension, without tensing the muscles along the top of your shoulders and the back of your neck.  Hold for __________ seconds. Slowly release the tension and relax your muscles completely before starting the next repetition.  After you have practiced this exercise, remove the arm support and complete the exercise in standing as well as sitting position. Repeat __________ times. Complete this exercise __________ times per day.  STRENGTH - Shoulder Abductors, Isometric  With good posture, stand or sit about 4-6 inches from a wall, with your right / left side facing the wall.  Bend your right / left elbow. Gently press your right / left elbow into the wall. Increase the pressure gradually, until you  are pressing as hard as you can, without shrugging your shoulder or increasing any shoulder discomfort.  Hold for __________ seconds.  Release the tension slowly. Relax your shoulder muscles completely before you begin the next repetition. Repeat __________ times. Complete this exercise __________ times per day.  STRENGTH - External Rotators, Isometric  Keep your right / left elbow at your side and bend it 90 degrees.  Step into a door frame so that the outside of your right / left wrist can press against the door frame without your upper arm leaving your side.  Gently press your right / left wrist into the door frame, as if you were trying to swing the back of your hand away from your stomach. Gradually increase the tension, until you are pressing as hard as you can, without shrugging your shoulder  or increasing any shoulder discomfort.  Hold for __________ seconds.  Release the tension slowly. Relax your shoulder muscles completely before you begin the next repetition. Repeat __________ times. Complete this exercise __________ times per day.  STRENGTH - Supraspinatus   Stand or sit with good posture. Grasp a __________ weight, or an exercise band or tubing, so that your hand is "thumbs-up," like you are shaking hands.  Slowly lift your right / left arm in a "V" away from your thigh, diagonally into the space between your side and straight ahead. Lift your hand to shoulder height or as far as you can, without increasing any shoulder pain. At first, many people do not lift their hands above shoulder height.  Avoid shrugging your right / left shoulder as your arm rises, by keeping your shoulder blade tucked down and toward your mid-back spine.  Hold for __________ seconds. Control the descent of your hand, as you slowly return to your starting position. Repeat __________ times. Complete this exercise __________ times per day.  STRENGTH - External Rotators  Secure a rubber exercise band or tubing to a fixed object (table, pole) so that it is at the same height as your right / left elbow when you are standing or sitting on a firm surface.  Stand or sit so that the secured exercise band is at your uninjured side.  Bend your right / left elbow 90 degrees. Place a folded towel or small pillow under your right / left arm, so that your elbow is a few inches away from your side.  Keeping the tension on the exercise band, pull it away from your body, as if pivoting on your elbow. Be sure to keep your body steady, so that the movement is coming only from your rotating shoulder.  Hold for __________ seconds. Release the tension in a controlled manner, as you return to the starting position. Repeat __________ times. Complete this exercise __________ times per day.  STRENGTH - Internal Rotators    Secure a rubber exercise band or tubing to a fixed object (table, pole) so that it is at the same height as your right / left elbow when you are standing or sitting on a firm surface.  Stand or sit so that the secured exercise band is at your right / left side.  Bend your elbow 90 degrees. Place a folded towel or small pillow under your right / left arm so that your elbow is a few inches away from your side.  Keeping the tension on the exercise band, pull it across your body, toward your stomach. Be sure to keep your body steady, so that the movement is coming  only from your rotating shoulder.  Hold for __________ seconds. Release the tension in a controlled manner, as you return to the starting position. Repeat __________ times. Complete this exercise __________ times per day.  STRENGTH - Scapular Protractors, Standing   Stand arms length away from a wall. Place your hands on the wall, keeping your elbows straight.  Begin by dropping your shoulder blades down and toward your mid-back spine.  To strengthen your protractors, keep your shoulder blades down, but slide them forward on your rib cage. It will feel as if you are lifting the back of your rib cage away from the wall. This is a subtle motion and can be challenging to complete. Ask your caregiver for further instruction, if you are not sure you are doing the exercise correctly.  Hold for __________ seconds. Slowly return to the starting position, resting the muscles completely before starting the next repetition. Repeat __________ times. Complete this exercise __________ times per day. STRENGTH - Scapular Protractors, Supine  Lie on your back on a firm surface. Extend your right / left arm straight into the air while holding a __________ weight in your hand.  Keeping your head and back in place, lift your shoulder off the floor.  Hold for __________ seconds. Slowly return to the starting position, and allow your muscles to relax  completely before starting the next repetition. Repeat __________ times. Complete this exercise __________ times per day. STRENGTH - Scapular Protractors, Quadruped  Get onto your hands and knees, with your shoulders directly over your hands (or as close as you can be, comfortably).  Keeping your elbows locked, lift the back of your rib cage up into your shoulder blades, so your mid-back rounds out. Keep your neck muscles relaxed.  Hold this position for __________ seconds. Slowly return to the starting position and allow your muscles to relax completely before starting the next repetition. Repeat __________ times. Complete this exercise __________ times per day.  STRENGTH - Scapular Retractors  Secure a rubber exercise band or tubing to a fixed object (table, pole), so that it is at the height of your shoulders when you are either standing, or sitting on a firm armless chair.  With a palm down grip, grasp an end of the band in each hand. Straighten your elbows and lift your hands straight in front of you, at shoulder height. Step back, away from the secured end of the band, until it becomes tense.  Squeezing your shoulder blades together, draw your elbows back toward your sides, as you bend them. Keep your upper arms lifted away from your body throughout the exercise.  Hold for __________ seconds. Slowly ease the tension on the band, as you reverse the directions and return to the starting position. Repeat __________ times. Complete this exercise __________ times per day. STRENGTH - Shoulder Extensors   Secure a rubber exercise band or tubing to a fixed object (table, pole) so that it is at the height of your shoulders when you are either standing, or sitting on a firm armless chair.  With a thumbs-up grip, grasp an end of the band in each hand. Straighten your elbows and lift your hands straight in front of you, at shoulder height. Step back, away from the secured end of the band, until it  becomes tense.  Squeezing your shoulder blades together, pull your hands down to the sides of your thighs. Do not allow your hands to go behind you.  Hold for __________ seconds. Slowly ease the  tension on the band, as you reverse the directions and return to the starting position. Repeat __________ times. Complete this exercise __________ times per day.  STRENGTH - Scapular Retractors and External Rotators   Secure a rubber exercise band or tubing to a fixed object (table, pole) so that it is at the height as your shoulders, when you are either standing, or sitting on a firm armless chair.  With a palm down grip, grasp an end of the band in each hand. Bend your elbows 90 degrees and lift your elbows to shoulder height, at your sides. Step back, away from the secured end of the band, until it becomes tense.  Squeezing your shoulder blades together, rotate your shoulders so that your upper arms and elbows remain stationary, but your fists travel upward to head height.  Hold for __________ seconds. Slowly ease the tension on the band, as you reverse the directions and return to the starting position. Repeat __________ times. Complete this exercise __________ times per day.  STRENGTH - Scapular Retractors and External Rotators, Rowing   Secure a rubber exercise band or tubing to a fixed object (table, pole) so that it is at the height of your shoulders, when you are either standing, or sitting on a firm armless chair.  With a palm down grip, grasp an end of the band in each hand. Straighten your elbows and lift your hands straight in front of you, at shoulder height. Step back, away from the secured end of the band, until it becomes tense.  Step 1: Squeeze your shoulder blades together. Bending your elbows, draw your hands to your chest, as if you are rowing a boat. At the end of this motion, your hands and elbow should be at shoulder height and your elbows should be out to your sides.  Step 2:  Rotate your shoulders, to raise your hands above your head. Your forearms should be vertical and your upper arms should be horizontal.  Hold for __________ seconds. Slowly ease the tension on the band, as you reverse the directions and return to the starting position. Repeat __________ times. Complete this exercise __________ times per day.  STRENGTH - Scapular Depressors  Find a sturdy chair without wheels, such as a dining room chair.  Keeping your feet on the floor, and your hands on the chair arms, lift your bottom up from the seat, and lock your elbows.  Keeping your elbows straight, allow gravity to pull your body weight down. Your shoulders will rise toward your ears.  Raise your body against gravity by drawing your shoulder blades down your back, shortening the distance between your shoulders and ears. Although your feet should always maintain contact with the floor, your feet should progressively support less body weight, as you get stronger.  Hold for __________ seconds. In a controlled and slow manner, lower your body weight to begin the next repetition. Repeat __________ times. Complete this exercise __________ times per day.  Document Released: 05/12/2005 Document Revised: 08/04/2011 Document Reviewed: 08/24/2008 [facility] Patient Information 2015 [place], [place]. This information is not intended to replace advice given to you by your health care provider. Make sure you discuss any questions you have with your health care provider.

## 2014-02-13 NOTE — Progress Notes (Signed)
Pre visit review using our clinic review tool, if applicable. No additional management support is needed unless otherwise documented below in the visit note. 

## 2014-02-13 NOTE — Progress Notes (Signed)
Patient ID: Rebecca Novak, female   DOB: [birth year], 68 y.o.   MRN: [id number] Patient ID: Rebecca Novak, female   DOB: [birth year], 68 y.o.   MRN: [id number]  Subjective:    Patient ID: Rebecca Novak, female    DOB: [birth year], 68 y.o.   MRN: [id number]  HPI  History of Present Illness:   68 year old patient who is seen today for an annual comprehensive evaluation.  Medical problems include  impaired glucose tolerance. She has history of colonic polyps and had a colonoscopy in March of  2010. She has treated hypothyroidism and dyslipidemia. She is followed by psychiatry for bipolar depression. Doing quite well.   Remains active with swimming which she feels has helped the discomfort   Here for Medicare AWV:   1. Risk factors based on Past M, S, F history: risk factors include a history of  tobacco use; discontinued tobacco in October of 2011, and dyslipidemia. She also has a history of colonic polyps  2. Physical Activities: walks and swims without exercise limitation  3. Depression/mood: history bipolar depression, followed by psychiatry  4. Hearing: no deficits  5. ADL's: completely independent in all aspects of daily living  6. Fall Risk: low  7. Home Safety: no problems identified  8. Height, weight, &visual acuity: height and weight are stable. No difficulty with visual acuity  9. Counseling: smoking cessation discussed  10. Labs ordered based on risk factors: laboratory profile, including TSH, and lipid profile will be reviewed  11. Referral Coordination- will continue gynecologic and psychiatric follow-up  12. Care Plan- heart healthy diet more regular exercise. Encouraged will need follow-up colonoscopy in 4 years  22. Cognitive Assessment- alert and oriented, with normal affect  14.  Preventive services include followup colonoscopy.  Annual mammogram recommended.  Calcium and vitamin D supplements encouraged 15.  Provide a list of data includes radiology and  orthopedics and gastroenterology  Preventive Screening-Counseling & Management  Alcohol-Tobacco  Smoking Status: Discontinued approximately 3 years ago he    Allergies (verified):  No Known Drug Allergies   Past History:  Past Medical History:    Hyperlipidemia  Hypothyroidism  ongoing tobacco use  bipolar depression  Colonic polyps, hx of  glucose intolerance    Past Surgical History:  Appendectomy  Hysterectomy Age 68  colonoscopy 07-2008  2-D echocardiogram : January 2009   Family History:   mother died age 37. History dementia  father Age 70 ;  One brother history schizophrenia. Two sisters positive for depression; one died recently died spring 2013 due to apparent pneumonia  Social History:   Single  Smoking Status: current   Past Medical History  Diagnosis Date  . ANEMIA, MILD 05/17/2008  . COLONIC POLYPS, HX OF 05/17/2007  . GLUCOSE INTOLERANCE 11/23/2007  . HAND PAIN 07/31/2008  . HYPERLIPIDEMIA 02/05/2007  . HYPOTHYROIDISM 02/05/2007  . SYSTOLIC MURMUR 53/61/4431  . Depression     bipolar  . Skin cancer     "scraped off chest and RLE" (02/15/2013)    History   Social History  . Marital Status: Single    Spouse Name: N/A    Number of Children: N/A  . Years of Education: N/A   Occupational History  . Not on file.   Social History Main Topics  . Smoking status: Former Smoker -- 0.33 packs/day for 44 years    Types: Cigarettes  . Smokeless tobacco: Never Used     Comment: 02/15/2013 "stopped smoking cigarettes ~ 7 yr  ago"  . Alcohol Use: No  . Drug Use: No  . Sexual Activity: Yes   Other Topics Concern  . Not on file   Social History Narrative  . No narrative on file    Past Surgical History  Procedure Laterality Date  . Appendectomy    . Tonsillectomy    . Vaginal hysterectomy      No family history on file.  No Known Allergies  Current Outpatient Prescriptions on File Prior to Visit  Medication Sig Dispense Refill  .  aspirin 81 MG tablet Take 81 mg by mouth daily.        . Calcium Carbonate (CALCIUM 500 PO) Take 2 tablets by mouth daily.        . Carbamazepine (EQUETRO) 300 MG CP12 Take 4 capsules by mouth daily. One capsule [300 mg] at 11:30 AM, one capsule [300 mg] at 7:30 PM, and two capsules [600 mg] at 11:30 PM.      . levothyroxine (SYNTHROID) 75 MCG tablet Take 1 tablet (75 mcg total) by mouth daily.  90 tablet  3  . perphenazine (TRILAFON) 2 MG tablet Take 1 tablet (2 mg total) by mouth every morning.  30 tablet  0  . QUEtiapine (SEROQUEL) 400 MG tablet Take 1,200 mg by mouth at bedtime.       . SF 5000 PLUS 1.1 % CREA dental cream Place 1 application onto teeth at bedtime.       . simvastatin (ZOCOR) 40 MG tablet Take 1 tablet (40 mg total) by mouth at bedtime.  90 tablet  1  . zolpidem (AMBIEN) 10 MG tablet Take 5-10 mg by mouth at bedtime as needed.        No current facility-administered medications on file prior to visit.    BP 122/70  Pulse 65  Temp(Src) 98.1 F (36.7 C) (Oral)  Resp 20  Ht 5\' 8"  (1.727 m)  Wt 181 lb (82.101 kg)  BMI 27.53 kg/m2  SpO2 98%     Review of Systems  Constitutional: Negative for fever, appetite change, fatigue and unexpected weight change.  HENT: Negative for congestion, dental problem, ear pain, hearing loss, mouth sores, nosebleeds, sinus pressure, sore throat, tinnitus, trouble swallowing and voice change.   Eyes: Negative for photophobia, pain, redness and visual disturbance.  Respiratory: Negative for cough, chest tightness and shortness of breath.   Cardiovascular: Negative for chest pain, palpitations and leg swelling.  Gastrointestinal: Positive for constipation. Negative for nausea, vomiting, abdominal pain, diarrhea, blood in stool, abdominal distention and rectal pain.  Genitourinary: Negative for dysuria, urgency, frequency, hematuria, flank pain, vaginal bleeding, vaginal discharge, difficulty urinating, genital sores, vaginal pain, menstrual  problem and pelvic pain.  Musculoskeletal: Negative for arthralgias, back pain and neck stiffness.       Right shoulder pain  Skin: Negative for rash.  Neurological: Negative for dizziness, syncope, speech difficulty, weakness, light-headedness, numbness and headaches.  Hematological: Negative for adenopathy. Does not bruise/bleed easily.  Psychiatric/Behavioral: Negative for suicidal ideas, behavioral problems, self-injury, dysphoric mood and agitation. The patient is not nervous/anxious.    who is     Objective:   Physical Exam  Constitutional: She is oriented to person, place, and time. She appears well-developed and well-nourished.  HENT:  Head: Normocephalic and atraumatic.  Right Ear: External ear normal.  Left Ear: External ear normal.  Mouth/Throat: Oropharynx is clear and moist.  Eyes: Conjunctivae and EOM are normal.  Neck: Normal range of motion. Neck supple. No JVD present.  No thyromegaly present.  Cardiovascular: Normal rate, regular rhythm, normal heart sounds and intact distal pulses.   No murmur heard. Pulmonary/Chest: Effort normal and breath sounds normal. She has no wheezes. She has no rales.  Abdominal: Soft. Bowel sounds are normal. She exhibits no distension and no mass. There is no tenderness. There is no rebound and no guarding.  Musculoskeletal: Normal range of motion. She exhibits no edema and no tenderness.  Neurological: She is alert and oriented to person, place, and time. She has normal reflexes. No cranial nerve deficit. She exhibits normal muscle tone. Coordination normal.  Skin: Skin is warm and dry. No rash noted.  Psychiatric: She has a normal mood and affect. Her behavior is normal.          Assessment & Plan:    Annual health maintenance History of glucose intolerance.  Fasting blood sugar normal last week Dyslipidemia.  Continue simvastatin 40 mg daily Colonic  polyps. Redo colonoscopy Anemia.  Followup colonoscopy

## 2014-02-14 NOTE — Telephone Encounter (Signed)
Pt notified labs put in the mail today.

## 2014-02-15 DIAGNOSIS — F3173 Bipolar disorder, in partial remission, most recent episode manic: Secondary | ICD-10-CM | POA: Diagnosis not present

## 2014-02-20 DIAGNOSIS — F3112 Bipolar disorder, current episode manic without psychotic features, moderate: Secondary | ICD-10-CM | POA: Diagnosis not present

## 2014-02-25 ENCOUNTER — Other Ambulatory Visit: Payer: Self-pay | Admitting: Internal Medicine

## 2014-03-01 ENCOUNTER — Telehealth: Payer: Self-pay | Admitting: Internal Medicine

## 2014-03-01 MED ORDER — LEVOTHYROXINE SODIUM 75 MCG PO TABS: ORAL_TABLET | ORAL | Status: DC

## 2014-03-01 NOTE — Telephone Encounter (Signed)
Rx resent to pharmacy

## 2014-03-01 NOTE — Telephone Encounter (Signed)
[facility] on [address] did not received req for levothyroxine 75 mcg #90. Please call pharm

## 2014-03-08 ENCOUNTER — Telehealth: Payer: Self-pay | Admitting: Internal Medicine

## 2014-03-08 ENCOUNTER — Ambulatory Visit: Payer: Medicare Other

## 2014-03-08 NOTE — Telephone Encounter (Signed)
Pt would like new rxs levothyroxine 75 mcg #90 w/refills and simvatatin 40 mg #90 w/refills. Pt would like rxs mail to her

## 2014-03-09 MED ORDER — SIMVASTATIN 40 MG PO TABS: 40.0000 mg | ORAL_TABLET | Freq: Every day | ORAL | Status: DC

## 2014-03-09 NOTE — Telephone Encounter (Signed)
Left message on voicemail to call office.  

## 2014-03-10 ENCOUNTER — Ambulatory Visit: Payer: Medicare Other

## 2014-03-10 NOTE — Telephone Encounter (Signed)
Left message on voicemail to call office.  

## 2014-03-16 NOTE — Telephone Encounter (Signed)
Left message with [name], that Rx's were sent to local pharmacy and if she needs me to send to mail order just let me know. [name] said he will give pt message.

## 2014-03-20 ENCOUNTER — Ambulatory Visit: Payer: Medicare Other

## 2014-03-23 ENCOUNTER — Ambulatory Visit: Payer: Medicare Other

## 2014-03-23 ENCOUNTER — Ambulatory Visit (INDEPENDENT_AMBULATORY_CARE_PROVIDER_SITE_OTHER): Payer: Medicare Other

## 2014-03-23 DIAGNOSIS — Z23 Encounter for immunization: Secondary | ICD-10-CM | POA: Diagnosis not present

## 2014-04-04 ENCOUNTER — Other Ambulatory Visit: Payer: Self-pay | Admitting: Internal Medicine

## 2014-04-04 DIAGNOSIS — Z1231 Encounter for screening mammogram for malignant neoplasm of breast: Secondary | ICD-10-CM

## 2014-04-05 DIAGNOSIS — D649 Anemia, unspecified: Secondary | ICD-10-CM | POA: Diagnosis not present

## 2014-04-12 DIAGNOSIS — F3173 Bipolar disorder, in partial remission, most recent episode manic: Secondary | ICD-10-CM | POA: Diagnosis not present

## 2014-04-26 DIAGNOSIS — D649 Anemia, unspecified: Secondary | ICD-10-CM | POA: Diagnosis not present

## 2014-05-01 DIAGNOSIS — D649 Anemia, unspecified: Secondary | ICD-10-CM | POA: Diagnosis not present

## 2014-05-04 DIAGNOSIS — D649 Anemia, unspecified: Secondary | ICD-10-CM | POA: Diagnosis not present

## 2014-05-04 DIAGNOSIS — Z124 Encounter for screening for malignant neoplasm of cervix: Secondary | ICD-10-CM | POA: Diagnosis not present

## 2014-05-04 DIAGNOSIS — E559 Vitamin D deficiency, unspecified: Secondary | ICD-10-CM | POA: Diagnosis not present

## 2014-05-10 DIAGNOSIS — F3173 Bipolar disorder, in partial remission, most recent episode manic: Secondary | ICD-10-CM | POA: Diagnosis not present

## 2014-05-11 ENCOUNTER — Ambulatory Visit (HOSPITAL_COMMUNITY): Payer: Medicare Other

## 2014-05-11 ENCOUNTER — Ambulatory Visit (HOSPITAL_COMMUNITY)
Admission: RE | Admit: 2014-05-11 | Discharge: 2014-05-11 | Disposition: A | Discharge status: Home / Self Care | Payer: Medicare Other | Source: Ambulatory Visit | Attending: Internal Medicine | Admitting: Internal Medicine

## 2014-05-11 ENCOUNTER — Ambulatory Visit (HOSPITAL_COMMUNITY)
Admission: RE | Admit: 2014-05-11 | Discharge: 2014-05-11 | Disposition: A | Discharge status: Home / Self Care | Payer: Medicare Other | Source: Ambulatory Visit | Attending: Obstetrics | Admitting: Obstetrics

## 2014-05-11 DIAGNOSIS — E559 Vitamin D deficiency, unspecified: Secondary | ICD-10-CM

## 2014-05-11 DIAGNOSIS — Z1231 Encounter for screening mammogram for malignant neoplasm of breast: Secondary | ICD-10-CM | POA: Insufficient documentation

## 2014-05-11 DIAGNOSIS — Z780 Asymptomatic menopausal state: Secondary | ICD-10-CM | POA: Insufficient documentation

## 2014-05-11 DIAGNOSIS — Z13820 Encounter for screening for osteoporosis: Secondary | ICD-10-CM | POA: Insufficient documentation

## 2014-05-11 DIAGNOSIS — N951 Menopausal and female climacteric states: Secondary | ICD-10-CM

## 2014-05-17 ENCOUNTER — Encounter: Payer: Self-pay | Admitting: Internal Medicine

## 2014-05-17 ENCOUNTER — Ambulatory Visit (INDEPENDENT_AMBULATORY_CARE_PROVIDER_SITE_OTHER): Payer: Medicare Other | Admitting: Internal Medicine

## 2014-05-17 VITALS — BP 130/70 | HR 69 | Temp 98.1°F | Resp 20 | Ht 68.0 in | Wt 186.0 lb

## 2014-05-17 DIAGNOSIS — Z86010 Personal history of colonic polyps: Secondary | ICD-10-CM

## 2014-05-17 DIAGNOSIS — R2681 Unsteadiness on feet: Secondary | ICD-10-CM

## 2014-05-17 DIAGNOSIS — D509 Iron deficiency anemia, unspecified: Secondary | ICD-10-CM

## 2014-05-17 LAB — CBC WITH DIFFERENTIAL/PLATELET
BASOS ABS: 0 10*3/uL (ref 0.0–0.1)
Basophils Relative: 0.8 % (ref 0.0–3.0)
Eosinophils Absolute: 0.1 10*3/uL (ref 0.0–0.7)
Eosinophils Relative: 3.4 % (ref 0.0–5.0)
HCT: 33 % — ABNORMAL LOW (ref 36.0–46.0)
Hemoglobin: 10.9 g/dL — ABNORMAL LOW (ref 12.0–15.0)
LYMPHS PCT: 33.3 % (ref 12.0–46.0)
Lymphs Abs: 1.2 10*3/uL (ref 0.7–4.0)
MCHC: 32.9 g/dL (ref 30.0–36.0)
MCV: 86.9 fl (ref 78.0–100.0)
MONO ABS: 0.3 10*3/uL (ref 0.1–1.0)
Monocytes Relative: 9.2 % (ref 3.0–12.0)
NEUTROS PCT: 53.3 % (ref 43.0–77.0)
Neutro Abs: 1.9 10*3/uL (ref 1.4–7.7)
Platelets: 254 10*3/uL (ref 150.0–400.0)
RBC: 3.8 Mil/uL — ABNORMAL LOW (ref 3.87–5.11)
RDW: 17 % — ABNORMAL HIGH (ref 11.5–15.5)
WBC: 3.7 10*3/uL — ABNORMAL LOW (ref 4.0–10.5)

## 2014-05-17 NOTE — Progress Notes (Signed)
Pre visit review using our clinic review tool, if applicable. No additional management support is needed unless otherwise documented below in the visit note. 

## 2014-05-17 NOTE — Patient Instructions (Addendum)
Neurology consultation as discussed  Follow-up colonoscopy

## 2014-05-17 NOTE — Progress Notes (Signed)
Subjective:    Patient ID: Rebecca Novak, female    DOB: 06-25-1945, 68 y.o.   MRN: [id number]  HPI  68 year old patient who presents with a two-month history of intermittent dizziness.  She describes a sense of disequilibrium and being off balance more than true vertigo.  There have been no falls.  She was admitted to the hospital briefly in September 2014.  At that time she presented with nausea, vomiting, lower extremity weakness and some orthostatic hypotension.  Evaluation included brain MRI and head CT.  This revealed no ventriculomegaly or other significant abnormalities.  No history of neck pain or cervical MRI.  Past Medical History  Diagnosis Date  . ANEMIA, MILD 05/17/2008  . COLONIC POLYPS, HX OF 05/17/2007  . GLUCOSE INTOLERANCE 11/23/2007  . HAND PAIN 07/31/2008  . HYPERLIPIDEMIA 02/05/2007  . HYPOTHYROIDISM 02/05/2007  . SYSTOLIC MURMUR 16/38/4665  . Depression     bipolar  . Skin cancer     "scraped off chest and RLE" (02/15/2013)    History   Social History  . Marital Status: Single    Spouse Name: N/A    Number of Children: N/A  . Years of Education: N/A   Occupational History  . Not on file.   Social History Main Topics  . Smoking status: Former Smoker -- 0.33 packs/day for 44 years    Types: Cigarettes  . Smokeless tobacco: Never Used     Comment: 02/15/2013 "stopped smoking cigarettes ~ 7 yr ago"  . Alcohol Use: No  . Drug Use: No  . Sexual Activity: Yes   Other Topics Concern  . Not on file   Social History Narrative    Past Surgical History  Procedure Laterality Date  . Appendectomy    . Tonsillectomy    . Vaginal hysterectomy      History reviewed. No pertinent family history.  No Known Allergies  Current Outpatient Prescriptions on File Prior to Visit  Medication Sig Dispense Refill  . aspirin 81 MG tablet Take 81 mg by mouth daily.      . Calcium Carbonate (CALCIUM 500 PO) Take 2 tablets by mouth daily.      . Carbamazepine  (EQUETRO) 300 MG CP12 Take 4 capsules by mouth daily. One capsule [300 mg] at 11:30 AM, one capsule [300 mg] at 7:30 PM, and two capsules [600 mg] at 11:30 PM.    . levothyroxine (SYNTHROID, LEVOTHROID) 75 MCG tablet TAKE ONE TABLET BY MOUTH ONCE DAILY 90 tablet 1  . perphenazine (TRILAFON) 2 MG tablet Take 1 tablet (2 mg total) by mouth every morning. 30 tablet 0  . QUEtiapine (SEROQUEL) 400 MG tablet Take 1,200 mg by mouth at bedtime.     . SF 5000 PLUS 1.1 % CREA dental cream Place 1 application onto teeth at bedtime.     . simvastatin (ZOCOR) 40 MG tablet Take 1 tablet (40 mg total) by mouth at bedtime. 90 tablet 1  . zolpidem (AMBIEN) 10 MG tablet Take 5-10 mg by mouth at bedtime as needed (Prescribed by Dr. Clovis Pu).      No current facility-administered medications on file prior to visit.    BP 130/70 mmHg  Pulse 69  Temp(Src) 98.1 F (36.7 C) (Oral)  Resp 20  Ht 5\' 8"  (1.727 m)  Wt 186 lb (84.369 kg)  BMI 28.29 kg/m2  SpO2 98%     Review of Systems  Constitutional: Negative.   HENT: Negative for congestion, dental problem, hearing loss, rhinorrhea, sinus  pressure, sore throat and tinnitus.   Eyes: Negative for pain, discharge and visual disturbance.  Respiratory: Negative for cough and shortness of breath.   Cardiovascular: Negative for chest pain, palpitations and leg swelling.  Gastrointestinal: Negative for nausea, vomiting, abdominal pain, diarrhea, constipation, blood in stool and abdominal distention.  Genitourinary: Negative for dysuria, urgency, frequency, hematuria, flank pain, vaginal bleeding, vaginal discharge, difficulty urinating, vaginal pain and pelvic pain.  Musculoskeletal: Negative for joint swelling, arthralgias and gait problem.  Skin: Negative for rash.  Neurological: Positive for dizziness. Negative for syncope, speech difficulty, weakness, numbness and headaches.  Hematological: Negative for adenopathy.  Psychiatric/Behavioral: Negative for  behavioral problems, dysphoric mood and agitation. The patient is not nervous/anxious.        Objective:   Physical Exam  Constitutional: She appears well-developed and well-nourished. No distress.  Neurological:  No drift Finger to nose testing mildly impaired, but not grossly ataxic, left greater than the right Heel to shin testing performed slowly, without gross ataxia Gait is slightly unsteady; occasional tendency to fall to the left; decreased arm swing Romberg negative Unable to perform tandem walk Unable to balance on either leg          Assessment & Plan:   Unsteady gait.  Patient presents with a chief complaint of dizziness, but seems more a sense of disequilibrium.  No history of alcohol use.  Will obtain neurology evaluation.  Recent screening lab remarkable for mild anemia Dyslipidemia Hypothyroidism Mild anemia/history of colonic polyps.  Will schedule follow-up colonoscopy with [facility].  Repeat CBC History of impaired glucose tolerance

## 2014-05-18 ENCOUNTER — Ambulatory Visit (HOSPITAL_COMMUNITY): Payer: Medicare Other

## 2014-06-07 DIAGNOSIS — F3173 Bipolar disorder, in partial remission, most recent episode manic: Secondary | ICD-10-CM | POA: Diagnosis not present

## 2014-07-03 ENCOUNTER — Ambulatory Visit (INDEPENDENT_AMBULATORY_CARE_PROVIDER_SITE_OTHER): Payer: Medicare Other | Admitting: Neurology

## 2014-07-03 ENCOUNTER — Encounter: Payer: Self-pay | Admitting: Neurology

## 2014-07-03 VITALS — BP 140/84 | HR 67 | Ht 68.0 in | Wt 181.2 lb

## 2014-07-03 DIAGNOSIS — R42 Dizziness and giddiness: Secondary | ICD-10-CM | POA: Diagnosis not present

## 2014-07-03 NOTE — Patient Instructions (Signed)
I don't think the fainting spells are neurologic.  It likely is related to dehydration.  I would discuss with Dr. Raliegh Ip what further testing is needed.
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
## 2014-07-26 DIAGNOSIS — F3173 Bipolar disorder, in partial remission, most recent episode manic: Secondary | ICD-10-CM | POA: Diagnosis not present

## 2014-09-27 DIAGNOSIS — D485 Neoplasm of uncertain behavior of skin: Secondary | ICD-10-CM | POA: Diagnosis not present

## 2014-09-27 DIAGNOSIS — D0460 Carcinoma in situ of skin of unspecified upper limb, including shoulder: Secondary | ICD-10-CM | POA: Diagnosis not present

## 2014-09-28 DIAGNOSIS — D0462 Carcinoma in situ of skin of left upper limb, including shoulder: Secondary | ICD-10-CM | POA: Diagnosis not present

## 2014-09-28 DIAGNOSIS — L859 Epidermal thickening, unspecified: Secondary | ICD-10-CM | POA: Diagnosis not present

## 2014-09-28 DIAGNOSIS — D0461 Carcinoma in situ of skin of right upper limb, including shoulder: Secondary | ICD-10-CM | POA: Diagnosis not present

## 2014-09-28 DIAGNOSIS — L570 Actinic keratosis: Secondary | ICD-10-CM | POA: Diagnosis not present

## 2014-10-05 DIAGNOSIS — F3173 Bipolar disorder, in partial remission, most recent episode manic: Secondary | ICD-10-CM | POA: Diagnosis not present

## 2014-11-26 ENCOUNTER — Other Ambulatory Visit: Payer: Self-pay | Admitting: Internal Medicine

## 2014-11-30 ENCOUNTER — Telehealth: Payer: Self-pay | Admitting: Internal Medicine

## 2014-11-30 ENCOUNTER — Other Ambulatory Visit: Payer: Self-pay | Admitting: Internal Medicine

## 2014-11-30 MED ORDER — SIMVASTATIN 40 MG PO TABS: 40.0000 mg | ORAL_TABLET | Freq: Every day | ORAL | Status: DC

## 2014-11-30 NOTE — Telephone Encounter (Signed)
ok 

## 2014-11-30 NOTE — Telephone Encounter (Signed)
Spoke to pt, told her Rx sent to pharmacy. Pt verbalized understanding.

## 2014-11-30 NOTE — Telephone Encounter (Signed)
Pt needs refill on simvastatin 40 mg #90 w/refills send to [facility]

## 2014-12-07 DIAGNOSIS — D0460 Carcinoma in situ of skin of unspecified upper limb, including shoulder: Secondary | ICD-10-CM | POA: Diagnosis not present

## 2014-12-15 DIAGNOSIS — F3173 Bipolar disorder, in partial remission, most recent episode manic: Secondary | ICD-10-CM | POA: Diagnosis not present

## 2015-01-10 DIAGNOSIS — D649 Anemia, unspecified: Secondary | ICD-10-CM | POA: Diagnosis not present

## 2015-01-31 ENCOUNTER — Other Ambulatory Visit: Payer: Federal, State, Local not specified - PPO

## 2015-02-02 ENCOUNTER — Other Ambulatory Visit (INDEPENDENT_AMBULATORY_CARE_PROVIDER_SITE_OTHER): Payer: Medicare Other

## 2015-02-02 DIAGNOSIS — E785 Hyperlipidemia, unspecified: Secondary | ICD-10-CM | POA: Diagnosis not present

## 2015-02-02 DIAGNOSIS — E039 Hypothyroidism, unspecified: Secondary | ICD-10-CM | POA: Diagnosis not present

## 2015-02-02 DIAGNOSIS — Z0000 Encounter for general adult medical examination without abnormal findings: Secondary | ICD-10-CM

## 2015-02-02 LAB — CBC WITH DIFFERENTIAL/PLATELET
BASOS PCT: 0.7 % (ref 0.0–3.0)
Basophils Absolute: 0 10*3/uL (ref 0.0–0.1)
EOS ABS: 0.2 10*3/uL (ref 0.0–0.7)
Eosinophils Relative: 4.9 % (ref 0.0–5.0)
HCT: 34.8 % — ABNORMAL LOW (ref 36.0–46.0)
Hemoglobin: 11.9 g/dL — ABNORMAL LOW (ref 12.0–15.0)
Lymphocytes Relative: 42.3 % (ref 12.0–46.0)
Lymphs Abs: 1.7 10*3/uL (ref 0.7–4.0)
MCHC: 34.1 g/dL (ref 30.0–36.0)
MCV: 90.9 fl (ref 78.0–100.0)
MONO ABS: 0.4 10*3/uL (ref 0.1–1.0)
Monocytes Relative: 9.2 % (ref 3.0–12.0)
Neutro Abs: 1.7 10*3/uL (ref 1.4–7.7)
Neutrophils Relative %: 42.9 % — ABNORMAL LOW (ref 43.0–77.0)
Platelets: 249 10*3/uL (ref 150.0–400.0)
RBC: 3.83 Mil/uL — ABNORMAL LOW (ref 3.87–5.11)
RDW: 14.1 % (ref 11.5–15.5)
WBC: 4 10*3/uL (ref 4.0–10.5)

## 2015-02-02 LAB — BASIC METABOLIC PANEL
BUN: 14 mg/dL (ref 6–23)
CHLORIDE: 104 meq/L (ref 96–112)
CO2: 30 mEq/L (ref 19–32)
CREATININE: 0.87 mg/dL (ref 0.40–1.20)
Calcium: 8.8 mg/dL (ref 8.4–10.5)
GFR: 68.17 mL/min (ref >60.00)
GLUCOSE: 94 mg/dL (ref 70–99)
POTASSIUM: 4.9 meq/L (ref 3.5–5.1)
Sodium: 139 mEq/L (ref 135–145)

## 2015-02-02 LAB — HEPATIC FUNCTION PANEL
ALT: 14 U/L (ref 0–35)
AST: 20 U/L (ref 0–37)
Albumin: 3.8 g/dL (ref 3.5–5.2)
Alkaline Phosphatase: 77 U/L (ref 39–117)
BILIRUBIN DIRECT: 0.1 mg/dL (ref 0.0–0.3)
BILIRUBIN TOTAL: 0.4 mg/dL (ref 0.2–1.2)
Total Protein: 6.4 g/dL (ref 6.0–8.3)

## 2015-02-02 LAB — POCT URINALYSIS DIPSTICK
BILIRUBIN UA: NEGATIVE
Blood, UA: NEGATIVE
Glucose, UA: NEGATIVE
KETONES UA: NEGATIVE
LEUKOCYTES UA: NEGATIVE
NITRITE UA: NEGATIVE
PH UA: 7
PROTEIN UA: NEGATIVE
Spec Grav, UA: 1.015
Urobilinogen, UA: 0.2

## 2015-02-02 LAB — LIPID PANEL
CHOL/HDL RATIO: 3
Cholesterol: 228 mg/dL — ABNORMAL HIGH (ref 0–200)
HDL: 74.7 mg/dL (ref >39.00)
LDL Cholesterol: 135 mg/dL — ABNORMAL HIGH (ref 0–99)
NONHDL: 153.15
TRIGLYCERIDES: 92 mg/dL (ref 0.0–149.0)
VLDL: 18.4 mg/dL (ref 0.0–40.0)

## 2015-02-02 LAB — TSH: TSH: 5.93 u[IU]/mL — ABNORMAL HIGH (ref 0.35–4.50)

## 2015-02-02 LAB — MICROALBUMIN / CREATININE URINE RATIO
Creatinine,U: 79.9 mg/dL
MICROALB/CREAT RATIO: 0.9 mg/g (ref 0.0–30.0)
Microalb, Ur: <0.7 mg/dL (ref 0.0–1.9)

## 2015-02-02 LAB — HEMOGLOBIN A1C: HEMOGLOBIN A1C: 5.7 % (ref 4.6–6.5)

## 2015-02-07 ENCOUNTER — Ambulatory Visit (INDEPENDENT_AMBULATORY_CARE_PROVIDER_SITE_OTHER): Payer: Medicare Other | Admitting: Internal Medicine

## 2015-02-07 ENCOUNTER — Encounter: Payer: Self-pay | Admitting: Internal Medicine

## 2015-02-07 VITALS — BP 130/72 | HR 66 | Temp 98.1°F | Resp 20 | Ht 68.0 in | Wt 187.0 lb

## 2015-02-07 DIAGNOSIS — E785 Hyperlipidemia, unspecified: Secondary | ICD-10-CM | POA: Diagnosis not present

## 2015-02-07 DIAGNOSIS — Z86010 Personal history of colonic polyps: Secondary | ICD-10-CM

## 2015-02-07 DIAGNOSIS — E039 Hypothyroidism, unspecified: Secondary | ICD-10-CM | POA: Diagnosis not present

## 2015-02-07 DIAGNOSIS — Z0000 Encounter for general adult medical examination without abnormal findings: Secondary | ICD-10-CM
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
## 2015-02-07 NOTE — Progress Notes (Signed)
Pre visit review using our clinic review tool, if applicable. No additional management support is needed unless otherwise documented below in the visit note. 

## 2015-02-07 NOTE — Patient Instructions (Signed)
Limit your sodium (Salt) intake    It is important that you exercise regularly, at least 20 minutes 3 to 4 times per week.  If you develop chest pain or shortness of breath seek  medical attention.  Return in one year for follow-up  Health Maintenance Adopting a healthy lifestyle and getting preventive care can go a long way to promote health and wellness. Talk with your health care provider about what schedule of regular examinations is right for you. This is a good chance for you to check in with your provider about disease prevention and staying healthy. In between checkups, there are plenty of things you can do on your own. Experts have done a lot of research about which lifestyle changes and preventive measures are most likely to keep you healthy. Ask your health care provider for more information. WEIGHT AND DIET  Eat a healthy diet  Be sure to include plenty of vegetables, fruits, low-fat dairy products, and lean protein.  Do not eat a lot of foods high in solid fats, added sugars, or salt.  Get regular exercise. This is one of the most important things you can do for your health.  Most adults should exercise for at least 150 minutes each week. The exercise should increase your heart rate and make you sweat (moderate-intensity exercise).  Most adults should also do strengthening exercises at least twice a week. This is in addition to the moderate-intensity exercise.  Maintain a healthy weight  Body mass index (BMI) is a measurement that can be used to identify possible weight problems. It estimates body fat based on height and weight. Your health care provider can help determine your BMI and help you achieve or maintain a healthy weight.  For females 20 years of age and older:   A BMI below 18.5 is considered underweight.  A BMI of 18.5 to 24.9 is normal.  A BMI of 25 to 29.9 is considered overweight.  A BMI of 30 and above is considered obese.  Watch levels of cholesterol  and blood lipids  You should start having your blood tested for lipids and cholesterol at 71 years of age, then have this test every 5 years.  You may need to have your cholesterol levels checked more often if:  Your lipid or cholesterol levels are high.  You are older than 71 years of age.  You are at high risk for heart disease.  CANCER SCREENING   Lung Cancer  Lung cancer screening is recommended for adults 55-80 years old who are at high risk for lung cancer because of a history of smoking.  A yearly low-dose CT scan of the lungs is recommended for people who:  Currently smoke.  Have quit within the past 15 years.  Have at least a 30-pack-year history of smoking. A pack year is smoking an average of one pack of cigarettes a day for 1 year.  Yearly screening should continue until it has been 15 years since you quit.  Yearly screening should stop if you develop a health problem that would prevent you from having lung cancer treatment.  Breast Cancer  Practice breast self-awareness. This means understanding how your breasts normally appear and feel.  It also means doing regular breast self-exams. Let your health care provider know about any changes, no matter how small.  If you are in your 20s or 30s, you should have a clinical breast exam (CBE) by a health care provider every 1-3 years as part of a   regular health exam.  If you are 40 or older, have a CBE every year. Also consider having a breast X-ray (mammogram) every year.  If you have a family history of breast cancer, talk to your health care provider about genetic screening.  If you are at high risk for breast cancer, talk to your health care provider about having an MRI and a mammogram every year.  Breast cancer gene (BRCA) assessment is recommended for women who have family members with BRCA-related cancers. BRCA-related cancers include:  Breast.  Ovarian.  Tubal.  Peritoneal cancers.  Results of the  assessment will determine the need for genetic counseling and BRCA1 and BRCA2 testing. Cervical Cancer Routine pelvic examinations to screen for cervical cancer are no longer recommended for nonpregnant women who are considered low risk for cancer of the pelvic organs (ovaries, uterus, and vagina) and who do not have symptoms. A pelvic examination may be necessary if you have symptoms including those associated with pelvic infections. Ask your health care provider if a screening pelvic exam is right for you.   The Pap test is the screening test for cervical cancer for women who are considered at risk.  If you had a hysterectomy for a problem that was not cancer or a condition that could lead to cancer, then you no longer need Pap tests.  If you are older than 65 years, and you have had normal Pap tests for the past 10 years, you no longer need to have Pap tests.  If you have had past treatment for cervical cancer or a condition that could lead to cancer, you need Pap tests and screening for cancer for at least 20 years after your treatment.  If you no longer get a Pap test, assess your risk factors if they change (such as having a new sexual partner). This can affect whether you should start being screened again.  Some women have medical problems that increase their chance of getting cervical cancer. If this is the case for you, your health care provider may recommend more frequent screening and Pap tests.  The human papillomavirus (HPV) test is another test that may be used for cervical cancer screening. The HPV test looks for the virus that can cause cell changes in the cervix. The cells collected during the Pap test can be tested for HPV.  The HPV test can be used to screen women 30 years of age and older. Getting tested for HPV can extend the interval between normal Pap tests from three to five years.  An HPV test also should be used to screen women of any age who have unclear Pap test  results.  After 71 years of age, women should have HPV testing as often as Pap tests.  Colorectal Cancer  This type of cancer can be detected and often prevented.  Routine colorectal cancer screening usually begins at 71 years of age and continues through 71 years of age.  Your health care provider may recommend screening at an earlier age if you have risk factors for colon cancer.  Your health care provider may also recommend using home test kits to check for hidden blood in the stool.  A small camera at the end of a tube can be used to examine your colon directly (sigmoidoscopy or colonoscopy). This is done to check for the earliest forms of colorectal cancer.  Routine screening usually begins at age 50.  Direct examination of the colon should be repeated every 5-10 years through 71   years of age. However, you may need to be screened more often if early forms of precancerous polyps or small growths are found. Skin Cancer  Check your skin from head to toe regularly.  Tell your health care provider about any new moles or changes in moles, especially if there is a change in a mole's shape or color.  Also tell your health care provider if you have a mole that is larger than the size of a pencil eraser.  Always use sunscreen. Apply sunscreen liberally and repeatedly throughout the day.  Protect yourself by wearing long sleeves, pants, a wide-brimmed hat, and sunglasses whenever you are outside. HEART DISEASE, DIABETES, AND HIGH BLOOD PRESSURE   Have your blood pressure checked at least every 1-2 years. High blood pressure causes heart disease and increases the risk of stroke.  If you are between 55 years and 79 years old, ask your health care provider if you should take aspirin to prevent strokes.  Have regular diabetes screenings. This involves taking a blood sample to check your fasting blood sugar level.  If you are at a normal weight and have a low risk for diabetes, have this  test once every three years after 71 years of age.  If you are overweight and have a high risk for diabetes, consider being tested at a younger age or more often. PREVENTING INFECTION  Hepatitis B  If you have a higher risk for hepatitis B, you should be screened for this virus. You are considered at high risk for hepatitis B if:  You were born in a country where hepatitis B is common. Ask your health care provider which countries are considered high risk.  Your parents were born in a high-risk country, and you have not been immunized against hepatitis B (hepatitis B vaccine).  You have HIV or AIDS.  You use needles to inject street drugs.  You live with someone who has hepatitis B.  You have had sex with someone who has hepatitis B.  You get hemodialysis treatment.  You take certain medicines for conditions, including cancer, organ transplantation, and autoimmune conditions. Hepatitis C  Blood testing is recommended for:  Everyone born from 1945 through 1965.  Anyone with known risk factors for hepatitis C. Sexually transmitted infections (STIs)  You should be screened for sexually transmitted infections (STIs) including gonorrhea and chlamydia if:  You are sexually active and are younger than 71 years of age.  You are older than 71 years of age and your health care provider tells you that you are at risk for this type of infection.  Your sexual activity has changed since you were last screened and you are at an increased risk for chlamydia or gonorrhea. Ask your health care provider if you are at risk.  If you do not have HIV, but are at risk, it may be recommended that you take a prescription medicine daily to prevent HIV infection. This is called pre-exposure prophylaxis (PrEP). You are considered at risk if:  You are sexually active and do not regularly use condoms or know the HIV status of your partner(s).  You take drugs by injection.  You are sexually active with  a partner who has HIV. Talk with your health care provider about whether you are at high risk of being infected with HIV. If you choose to begin PrEP, you should first be tested for HIV. You should then be tested every 3 months for as long as you are taking PrEP.    PREGNANCY   If you are premenopausal and you may become pregnant, ask your health care provider about preconception counseling.  If you may become pregnant, take 400 to 800 micrograms (mcg) of folic acid every day.  If you want to prevent pregnancy, talk to your health care provider about birth control (contraception). OSTEOPOROSIS AND MENOPAUSE   Osteoporosis is a disease in which the bones lose minerals and strength with aging. This can result in serious bone fractures. Your risk for osteoporosis can be identified using a bone density scan.  If you are 44 years of age or older, or if you are at risk for osteoporosis and fractures, ask your health care provider if you should be screened.  Ask your health care provider whether you should take a calcium or vitamin D supplement to lower your risk for osteoporosis.  Menopause may have certain physical symptoms and risks.  Hormone replacement therapy may reduce some of these symptoms and risks. Talk to your health care provider about whether hormone replacement therapy is right for you.  HOME CARE INSTRUCTIONS   Schedule regular health, dental, and eye exams.  Stay current with your immunizations.   Do not use any tobacco products including cigarettes, chewing tobacco, or electronic cigarettes.  If you are pregnant, do not drink alcohol.  If you are breastfeeding, limit how much and how often you drink alcohol.  Limit alcohol intake to no more than 1 drink per day for nonpregnant women. One drink equals 12 ounces of beer, 5 ounces of wine, or 1 ounces of hard liquor.  Do not use street drugs.  Do not share needles.  Ask your health care provider for  help if you need support or information about quitting drugs.  Tell your health care provider if you often feel depressed.  Tell your health care provider if you have ever been abused or do not feel safe at home. Document Released: 11/25/2010 Document Revised: 09/26/2013 Document Reviewed: 04/13/2013 [facility] Patient Information 2015 [place], [place]. This information is not intended to replace advice given to you by your health care provider. Make sure you discuss any questions you have with your health care provider.

## 2015-03-01 ENCOUNTER — Other Ambulatory Visit: Payer: Self-pay | Admitting: Internal Medicine

## 2015-03-01 ENCOUNTER — Other Ambulatory Visit: Payer: Self-pay

## 2015-03-01 DIAGNOSIS — Z1231 Encounter for screening mammogram for malignant neoplasm of breast: Secondary | ICD-10-CM

## 2015-03-06 ENCOUNTER — Ambulatory Visit (INDEPENDENT_AMBULATORY_CARE_PROVIDER_SITE_OTHER): Payer: Medicare Other | Admitting: *Deleted

## 2015-03-06 ENCOUNTER — Other Ambulatory Visit: Payer: Self-pay | Admitting: *Deleted

## 2015-03-06 DIAGNOSIS — Z23 Encounter for immunization: Secondary | ICD-10-CM

## 2015-03-06 MED ORDER — SIMVASTATIN 40 MG PO TABS: 40.0000 mg | ORAL_TABLET | Freq: Every day | ORAL | Status: DC

## 2015-03-06 MED ORDER — LEVOTHYROXINE SODIUM 75 MCG PO TABS: 75.0000 ug | ORAL_TABLET | Freq: Every day | ORAL | Status: DC

## 2015-04-25 DIAGNOSIS — C44519 Basal cell carcinoma of skin of other part of trunk: Secondary | ICD-10-CM | POA: Diagnosis not present

## 2015-04-25 DIAGNOSIS — D0461 Carcinoma in situ of skin of right upper limb, including shoulder: Secondary | ICD-10-CM | POA: Diagnosis not present

## 2015-04-25 DIAGNOSIS — L570 Actinic keratosis: Secondary | ICD-10-CM | POA: Diagnosis not present

## 2015-05-09 ENCOUNTER — Ambulatory Visit
Admission: RE | Admit: 2015-05-09 | Discharge: 2015-05-09 | Disposition: A | Discharge status: Home / Self Care | Payer: Medicare Other | Source: Ambulatory Visit

## 2015-05-09 DIAGNOSIS — Z1231 Encounter for screening mammogram for malignant neoplasm of breast: Secondary | ICD-10-CM | POA: Diagnosis not present

## 2015-05-09 DIAGNOSIS — Z124 Encounter for screening for malignant neoplasm of cervix: Secondary | ICD-10-CM | POA: Diagnosis not present

## 2015-05-15 DIAGNOSIS — K635 Polyp of colon: Secondary | ICD-10-CM | POA: Diagnosis not present

## 2015-05-15 DIAGNOSIS — K621 Rectal polyp: Secondary | ICD-10-CM | POA: Diagnosis not present

## 2015-05-15 DIAGNOSIS — D509 Iron deficiency anemia, unspecified: Secondary | ICD-10-CM | POA: Diagnosis not present

## 2015-05-16 ENCOUNTER — Ambulatory Visit (INDEPENDENT_AMBULATORY_CARE_PROVIDER_SITE_OTHER): Payer: Medicare Other | Admitting: *Deleted

## 2015-05-16 ENCOUNTER — Ambulatory Visit: Payer: Medicare Other

## 2015-05-16 DIAGNOSIS — L570 Actinic keratosis: Secondary | ICD-10-CM | POA: Diagnosis not present

## 2015-05-16 DIAGNOSIS — L859 Epidermal thickening, unspecified: Secondary | ICD-10-CM | POA: Diagnosis not present

## 2015-05-16 DIAGNOSIS — Z23 Encounter for immunization: Secondary | ICD-10-CM | POA: Diagnosis not present

## 2015-05-16 DIAGNOSIS — D485 Neoplasm of uncertain behavior of skin: Secondary | ICD-10-CM | POA: Diagnosis not present

## 2015-05-23 ENCOUNTER — Encounter: Payer: Self-pay | Admitting: *Deleted

## 2015-06-25 DIAGNOSIS — L570 Actinic keratosis: Secondary | ICD-10-CM | POA: Diagnosis not present

## 2015-06-25 DIAGNOSIS — L710 Perioral dermatitis: Secondary | ICD-10-CM | POA: Diagnosis not present

## 2015-06-29 DIAGNOSIS — Z86010 Personal history of colonic polyps: Secondary | ICD-10-CM | POA: Diagnosis not present

## 2015-06-29 DIAGNOSIS — D509 Iron deficiency anemia, unspecified: Secondary | ICD-10-CM | POA: Diagnosis not present

## 2015-06-29 DIAGNOSIS — K5901 Slow transit constipation: Secondary | ICD-10-CM | POA: Diagnosis not present

## 2015-07-03 DIAGNOSIS — L089 Local infection of the skin and subcutaneous tissue, unspecified: Secondary | ICD-10-CM | POA: Diagnosis not present

## 2015-07-03 DIAGNOSIS — Z5189 Encounter for other specified aftercare: Secondary | ICD-10-CM | POA: Diagnosis not present

## 2015-07-03 DIAGNOSIS — F3173 Bipolar disorder, in partial remission, most recent episode manic: Secondary | ICD-10-CM | POA: Diagnosis not present

## 2015-08-22 DIAGNOSIS — L570 Actinic keratosis: Secondary | ICD-10-CM | POA: Diagnosis not present

## 2015-08-22 DIAGNOSIS — L821 Other seborrheic keratosis: Secondary | ICD-10-CM | POA: Diagnosis not present

## 2015-09-26 DIAGNOSIS — F3173 Bipolar disorder, in partial remission, most recent episode manic: Secondary | ICD-10-CM | POA: Diagnosis not present

## 2015-09-28 ENCOUNTER — Ambulatory Visit (INDEPENDENT_AMBULATORY_CARE_PROVIDER_SITE_OTHER): Payer: Medicare Other | Admitting: Internal Medicine

## 2015-09-28 ENCOUNTER — Encounter: Payer: Self-pay | Admitting: Internal Medicine

## 2015-09-28 VITALS — BP 130/80 | HR 77 | Temp 98.2°F | Resp 20 | Ht 68.0 in | Wt 200.0 lb

## 2015-09-28 DIAGNOSIS — R29898 Other symptoms and signs involving the musculoskeletal system: Secondary | ICD-10-CM

## 2015-09-28 DIAGNOSIS — D509 Iron deficiency anemia, unspecified: Secondary | ICD-10-CM | POA: Diagnosis not present

## 2015-09-28 DIAGNOSIS — E039 Hypothyroidism, unspecified: Secondary | ICD-10-CM

## 2015-09-28 DIAGNOSIS — R55 Syncope and collapse: Secondary | ICD-10-CM | POA: Diagnosis not present

## 2015-09-28 DIAGNOSIS — I951 Orthostatic hypotension: Secondary | ICD-10-CM | POA: Diagnosis not present

## 2015-09-28 LAB — CBC WITH DIFFERENTIAL/PLATELET
BASOS ABS: 0 10*3/uL (ref 0.0–0.1)
BASOS PCT: 0.5 % (ref 0.0–3.0)
EOS ABS: 0.2 10*3/uL (ref 0.0–0.7)
Eosinophils Relative: 3.7 % (ref 0.0–5.0)
HEMATOCRIT: 34.3 % — AB (ref 36.0–46.0)
Hemoglobin: 11.5 g/dL — ABNORMAL LOW (ref 12.0–15.0)
LYMPHS ABS: 1.4 10*3/uL (ref 0.7–4.0)
LYMPHS PCT: 30.3 % (ref 12.0–46.0)
MCHC: 33.6 g/dL (ref 30.0–36.0)
MCV: 88.9 fl (ref 78.0–100.0)
Monocytes Absolute: 0.4 10*3/uL (ref 0.1–1.0)
Monocytes Relative: 8.6 % (ref 3.0–12.0)
NEUTROS ABS: 2.6 10*3/uL (ref 1.4–7.7)
NEUTROS PCT: 56.9 % (ref 43.0–77.0)
PLATELETS: 264 10*3/uL (ref 150.0–400.0)
RBC: 3.86 Mil/uL — ABNORMAL LOW (ref 3.87–5.11)
RDW: 15.2 % (ref 11.5–15.5)
WBC: 4.6 10*3/uL (ref 4.0–10.5)

## 2015-09-28 LAB — COMPREHENSIVE METABOLIC PANEL
ALT: 12 U/L (ref 0–35)
AST: 17 U/L (ref 0–37)
Albumin: 3.8 g/dL (ref 3.5–5.2)
Alkaline Phosphatase: 73 U/L (ref 39–117)
BILIRUBIN TOTAL: 0.3 mg/dL (ref 0.2–1.2)
BUN: 15 mg/dL (ref 6–23)
CALCIUM: 8.8 mg/dL (ref 8.4–10.5)
CHLORIDE: 107 meq/L (ref 96–112)
CO2: 26 meq/L (ref 19–32)
CREATININE: 0.87 mg/dL (ref 0.40–1.20)
GFR: 68.05 mL/min (ref >60.00)
GLUCOSE: 107 mg/dL — AB (ref 70–99)
Potassium: 4 mEq/L (ref 3.5–5.1)
SODIUM: 143 meq/L (ref 135–145)
Total Protein: 6.2 g/dL (ref 6.0–8.3)

## 2015-09-28 LAB — TSH: TSH: 4.4 u[IU]/mL (ref 0.35–4.50)

## 2015-09-28 NOTE — Progress Notes (Signed)
Subjective:    Patient ID: Rebecca Novak, female    DOB: [birth year], 70 y.o.   MRN: [id number]  HPI  70 year old patient who is seen today for evaluation of syncope  The patient was meter the hospital in September 2014 for near syncope.  This occurred in the setting of nausea, vomiting, and to be orthostatic hypotension.  He was associated with the leg weakness.  Full neurological evaluation was noncontributory  The patient was stable until 12 days ago when she had another syncopal episode.  She was ambulating in the kitchen and had sudden syncope without warning.  This was witnessed and her companion felt that she was out for perhaps 60 seconds.  There is no confusion or incontinence or seizure activity noted.  She did have knee pain and leg weakness.  She stated that she did not get out of bed until the following day.  Over the past.  The 12 days she has felt well.  She does have a history mild anemia and had a incomplete colonoscopy in December 2016.  She has been on supplemental iron  She has hypothyroidism and last TSH was slightly elevated  Past Medical History  Diagnosis Date  . ANEMIA, MILD 05/17/2008  . COLONIC POLYPS, HX OF 05/17/2007  . GLUCOSE INTOLERANCE 11/23/2007  . HAND PAIN 07/31/2008  . HYPERLIPIDEMIA 02/05/2007  . HYPOTHYROIDISM 02/05/2007  . SYSTOLIC MURMUR [id number]  . Depression     bipolar  . Skin cancer     "scraped off chest and RLE" (02/15/2013)     Social History   Social History  . Marital Status: Single    Spouse Name: N/A  . Number of Children: N/A  . Years of Education: N/A   Occupational History  . Not on file.   Social History Main Topics  . Smoking status: Former Smoker -- 0.33 packs/day for 44 years    Types: Cigarettes  . Smokeless tobacco: Never Used     Comment: 02/15/2013 "stopped smoking cigarettes ~ 7 yr ago"  . Alcohol Use: No  . Drug Use: No  . Sexual Activity: Yes   Other Topics Concern  . Not on file   Social  History Narrative    Past Surgical History  Procedure Laterality Date  . Appendectomy    . Tonsillectomy    . Vaginal hysterectomy      No family history on file.  No Known Allergies  Current Outpatient Prescriptions on File Prior to Visit  Medication Sig Dispense Refill  . Acetylcysteine (N-ACETYL-L-CYSTEINE PO) Take by mouth.    Marland Kitchen aspirin 81 MG tablet Take 81 mg by mouth daily.      . Calcium Carbonate (CALCIUM 500 PO) Take 2 tablets by mouth daily.      . Carbamazepine (EQUETRO) 300 MG CP12 Take 4 capsules by mouth daily. One capsule [300 mg] at 11:30 AM, one capsule [300 mg] at 7:30 PM, and two capsules [600 mg] at 11:30 PM.    . levothyroxine (SYNTHROID, LEVOTHROID) 75 MCG tablet Take 1 tablet (75 mcg total) by mouth daily. 90 tablet 3  . Misc Natural Products (LUTEIN 20 PO) Take by mouth.    . Omega-3 Fatty Acids (FISH OIL PO) Take by mouth 2 (two) times daily.    Marland Kitchen perphenazine (TRILAFON) 2 MG tablet Take 1 tablet (2 mg total) by mouth every morning. 30 tablet 0  . QUEtiapine (SEROQUEL) 400 MG tablet Take 1,200 mg by mouth at bedtime. Prescribed by Dr. Curt Jews    .  SF 5000 PLUS 1.1 % CREA dental cream Place 1 application onto teeth at bedtime.     . simvastatin (ZOCOR) 40 MG tablet Take 1 tablet (40 mg total) by mouth at bedtime. 90 tablet 3  . zolpidem (AMBIEN) 10 MG tablet Take 5-10 mg by mouth at bedtime as needed (Prescribed by Dr. Clovis Pu).      No current facility-administered medications on file prior to visit.    BP 130/80 mmHg  Pulse 77  Temp(Src) 98.2 F (36.8 C) (Oral)  Resp 20  Ht 5\' 8"  (1.727 m)  Wt 200 lb (90.719 kg)  BMI 30.42 kg/m2  SpO2 96%     Review of Systems  HENT: Negative for congestion, dental problem, hearing loss, rhinorrhea, sinus pressure, sore throat and tinnitus.   Eyes: Negative for pain, discharge and visual disturbance.  Respiratory: Negative for cough and shortness of breath.   Cardiovascular: Negative for chest pain,  palpitations and leg swelling.  Gastrointestinal: Negative for nausea, vomiting, abdominal pain, diarrhea, constipation, blood in stool and abdominal distention.  Genitourinary: Negative for dysuria, urgency, frequency, hematuria, flank pain, vaginal bleeding, vaginal discharge, difficulty urinating, vaginal pain and pelvic pain.  Musculoskeletal: Negative for joint swelling, arthralgias and gait problem.  Skin: Negative for rash.  Neurological: Positive for syncope and weakness. Negative for dizziness, speech difficulty, numbness and headaches.  Hematological: Negative for adenopathy.  Psychiatric/Behavioral: Negative for behavioral problems, dysphoric mood and agitation. The patient is not nervous/anxious.        Objective:   Physical Exam  Constitutional: She is oriented to person, place, and time. She appears well-developed and well-nourished.  Blood pressure 130/80 without orthostatic changes  HENT:  Head: Normocephalic.  Right Ear: External ear normal.  Left Ear: External ear normal.  Mouth/Throat: Oropharynx is clear and moist.  Eyes: Conjunctivae and EOM are normal. Pupils are equal, round, and reactive to light.  Neck: Normal range of motion. Neck supple. No thyromegaly present.  Cardiovascular: Normal rate, regular rhythm, normal heart sounds and intact distal pulses.   Pulmonary/Chest: Effort normal and breath sounds normal.  Abdominal: Soft. Bowel sounds are normal. She exhibits no mass. There is no tenderness.  Musculoskeletal: Normal range of motion.  Lymphadenopathy:    She has no cervical adenopathy.  Neurological: She is alert and oriented to person, place, and time. No cranial nerve deficit. Coordination normal.  Normal gait  Skin: Skin is warm and dry. No rash noted.  Bruising right knee  Psychiatric: She has a normal mood and affect. Her behavior is normal.          Assessment & Plan:   Syncope.  Patient has a prior history of near syncope.  Will check 2-D  echocardiogram and Holter monitor as well as EEG. Seizure precautions discussed Hypothyroidism.  Check follow-up TSH History of anemia.  Will check CBC as well as electrolytes

## 2015-09-28 NOTE — Progress Notes (Signed)
Pre visit review using our clinic review tool, if applicable. No additional management support is needed unless otherwise documented below in the visit note. 

## 2015-09-28 NOTE — Patient Instructions (Signed)
Do not drive or operate dangerous machinery until evaluation complete  Report any new or worsening symptoms  Return in 4 weeks for follow-up

## 2015-10-03 ENCOUNTER — Telehealth: Payer: Self-pay | Admitting: Internal Medicine

## 2015-10-03 NOTE — Telephone Encounter (Signed)
Spoke to pt, told her I do not see in his orders for Neurology referral. Looks like he only ordered Echo and Holter Monitor at this time but I will check with him on Friday and get back to you. Pt verbalized understanding.

## 2015-10-03 NOTE — Telephone Encounter (Addendum)
Pt would like you to call her about not receiving a call from the Neurologist.   Pt would like for you to call her on cell # [phone].

## 2015-10-04 NOTE — Telephone Encounter (Signed)
Dr. Raliegh Ip, did you want pt to see Neurology again? Pt sees Dr. Tomi Likens.

## 2015-10-04 NOTE — Telephone Encounter (Signed)
Rebecca Novak, can you look into this pt was to be scheduled for EEG and I do not see it scheduled. Pt calling.

## 2015-10-04 NOTE — Telephone Encounter (Signed)
Pt has called again to follow up on this EEG.  I see the order, but that is all. Is this the same thing as this prior message in note?

## 2015-10-05 NOTE — Telephone Encounter (Signed)
Please schedule EEG

## 2015-10-05 NOTE — Telephone Encounter (Signed)
Spoke to pt, told her sorry, there was a computer error and some how the test got cancelled but was sent over STAT and someone will be calling. Pt verbalized understanding and said she was told they will call her Monday. Told her okay.

## 2015-10-05 NOTE — Telephone Encounter (Signed)
Pt would like you to know that the Neurologist did not get info until today 8:22 a.m.  Would like to have a call back.

## 2015-10-05 NOTE — Telephone Encounter (Signed)
[facility] to where they will schedule.

## 2015-10-08 ENCOUNTER — Ambulatory Visit (INDEPENDENT_AMBULATORY_CARE_PROVIDER_SITE_OTHER): Payer: Medicare Other | Admitting: Neurology

## 2015-10-08 ENCOUNTER — Other Ambulatory Visit: Payer: Self-pay | Admitting: *Deleted

## 2015-10-08 DIAGNOSIS — R55 Syncope and collapse: Secondary | ICD-10-CM

## 2015-10-08 NOTE — Procedures (Signed)
ELECTROENCEPHALOGRAM REPORT  Date of Study: 10/08/2015  Patient's Name: Rebecca Novak MRN: [id number] Date of Birth: January 13, 1946  Referring Provider: Bluford Kaufmann, MD  Indication: Patient is a 70 year old female with medical history of mild syncope, glucose intolerance, hyperlipidemia, hypothyroidism and systolic murmur. She had a spell of near syncope in 2014 and then again recently this past April during which she was out for  1 minute. There were no other symptoms other than she passed out. She is having an EEG to assess for focal abnormalities.  Medications: Acetylcysteine, ASA, Calcium, Equetro, Levothyroxine, Fish Oil, Trilafon, Seroquel, Zocor, Ambien  Technical Summary: This is a multichannel digital EEG recording, using the international 10-20 placement system with electrodes applied with paste and impedances below 5000 ohms.    Description: The EEG background is symmetric, with a well-developed posterior dominant rhythm of 8 to 9 Hz, which is reactive to eye opening and closing.  Diffuse beta activity is seen, with a bilateral frontal preponderance.  No focal or generalized abnormalities are seen.  No focal or generalized epileptiform discharges are seen.  Stage II sleep is not seen.  Hyperventilation and photic stimulation were performed, and produced no abnormalities.  ECG revealed normal cardiac rate and rhythm.  Impression: This is a normal routine EEG of the awake and drowsy states, with activating procedures.  A normal study does not rule out the possibility of a seizure disorder in this patient.  Adam R. Tomi Likens, DO

## 2015-10-10 ENCOUNTER — Other Ambulatory Visit: Payer: Medicare Other

## 2015-10-15 ENCOUNTER — Ambulatory Visit (INDEPENDENT_AMBULATORY_CARE_PROVIDER_SITE_OTHER): Payer: Medicare Other

## 2015-10-15 ENCOUNTER — Other Ambulatory Visit: Payer: Self-pay

## 2015-10-15 ENCOUNTER — Ambulatory Visit (HOSPITAL_COMMUNITY): Payer: Medicare Other | Attending: Cardiology

## 2015-10-15 DIAGNOSIS — Z87891 Personal history of nicotine dependence: Secondary | ICD-10-CM | POA: Diagnosis not present

## 2015-10-15 DIAGNOSIS — R55 Syncope and collapse: Secondary | ICD-10-CM | POA: Diagnosis not present

## 2015-10-15 DIAGNOSIS — I340 Nonrheumatic mitral (valve) insufficiency: Secondary | ICD-10-CM | POA: Diagnosis not present

## 2015-10-15 DIAGNOSIS — E785 Hyperlipidemia, unspecified: Secondary | ICD-10-CM | POA: Diagnosis not present

## 2015-10-15 NOTE — Addendum Note (Signed)
Addended by: Vernetta Honey on: 10/15/2015 12:08 PM   Modules accepted: Orders

## 2015-10-26 ENCOUNTER — Encounter: Payer: Self-pay | Admitting: Internal Medicine

## 2015-10-26 ENCOUNTER — Ambulatory Visit (INDEPENDENT_AMBULATORY_CARE_PROVIDER_SITE_OTHER): Payer: Medicare Other | Admitting: Internal Medicine

## 2015-10-26 VITALS — BP 144/90 | HR 68 | Temp 98.4°F | Resp 20 | Ht 68.0 in | Wt 201.0 lb

## 2015-10-26 DIAGNOSIS — R55 Syncope and collapse: Secondary | ICD-10-CM

## 2015-10-26 NOTE — Patient Instructions (Signed)
Limit your sodium (Salt) intake    It is important that you exercise regularly, at least 20 minutes 3 to 4 times per week.  If you develop chest pain or shortness of breath seek  medical attention.  Return in one year for follow-up   

## 2015-10-26 NOTE — Progress Notes (Signed)
Subjective:    Patient ID: Rebecca Novak, female    DOB: July 01, 1945, 70 y.o.   MRN: [id number]  HPI  70 year old patient who is seen today in follow-up.  The patient had a syncopal episode approximately 6 weeks ago.  She was ambulating in the kitchen when she had a sudden syncopal episode without warning.  Seizure was witnessed.  No confusion, incontinence or seizure activity. She was hospitalized briefly for a near syncopal episode in 2015.  She had a full neurological evaluation at that time Since her last visit here, she has had a 2-D echocardiogram, event Monitor and EEG, all of which have been normal.  She has had no further symptoms.  She generally feels well  Past Medical History  Diagnosis Date  . ANEMIA, MILD 05/17/2008  . COLONIC POLYPS, HX OF 05/17/2007  . GLUCOSE INTOLERANCE 11/23/2007  . HAND PAIN 07/31/2008  . HYPERLIPIDEMIA 02/05/2007  . HYPOTHYROIDISM 02/05/2007  . SYSTOLIC MURMUR [id number]  . Depression     bipolar  . Skin cancer     "scraped off chest and RLE" (02/15/2013)     Social History   Social History  . Marital Status: Single    Spouse Name: N/A  . Number of Children: N/A  . Years of Education: N/A   Occupational History  . Not on file.   Social History Main Topics  . Smoking status: Former Smoker -- 0.33 packs/day for 44 years    Types: Cigarettes  . Smokeless tobacco: Never Used     Comment: 02/15/2013 "stopped smoking cigarettes ~ 7 yr ago"  . Alcohol Use: No  . Drug Use: No  . Sexual Activity: Yes   Other Topics Concern  . Not on file   Social History Narrative    Past Surgical History  Procedure Laterality Date  . Appendectomy    . Tonsillectomy    . Vaginal hysterectomy      No family history on file.  No Known Allergies  Current Outpatient Prescriptions on File Prior to Visit  Medication Sig Dispense Refill  . Acetylcysteine (N-ACETYL-L-CYSTEINE PO) Take by mouth.    Marland Kitchen aspirin 81 MG tablet Take 81 mg by mouth daily.       . Calcium Carbonate (CALCIUM 500 PO) Take 2 tablets by mouth daily.      . Carbamazepine (EQUETRO) 300 MG CP12 Take 4 capsules by mouth daily. One capsule [300 mg] at 11:30 AM, one capsule [300 mg] at 7:30 PM, and two capsules [600 mg] at 11:30 PM.    . levothyroxine (SYNTHROID, LEVOTHROID) 75 MCG tablet Take 1 tablet (75 mcg total) by mouth daily. 90 tablet 3  . Misc Natural Products (LUTEIN 20 PO) Take by mouth.    . Omega-3 Fatty Acids (FISH OIL PO) Take by mouth 2 (two) times daily.    . QUEtiapine (SEROQUEL) 400 MG tablet Take 1,200 mg by mouth at bedtime. Prescribed by Dr. Curt Jews    . SF 5000 PLUS 1.1 % CREA dental cream Place 1 application onto teeth at bedtime.     . simvastatin (ZOCOR) 40 MG tablet Take 1 tablet (40 mg total) by mouth at bedtime. 90 tablet 3  . zolpidem (AMBIEN) 10 MG tablet Take 5-10 mg by mouth at bedtime as needed (Prescribed by Dr. Clovis Pu).      No current facility-administered medications on file prior to visit.    BP 144/90 mmHg  Pulse 68  Temp(Src) 98.4 F (36.9 C) (Oral)  Resp 20  Ht 5\' 8"  (1.727 m)  Wt 201 lb (91.173 kg)  BMI 30.57 kg/m2  SpO2 96%     Review of Systems  Constitutional: Negative.   HENT: Negative for congestion, dental problem, hearing loss, rhinorrhea, sinus pressure, sore throat and tinnitus.   Eyes: Negative for pain, discharge and visual disturbance.  Respiratory: Negative for cough and shortness of breath.   Cardiovascular: Negative for chest pain, palpitations and leg swelling.  Gastrointestinal: Negative for nausea, vomiting, abdominal pain, diarrhea, constipation, blood in stool and abdominal distention.  Genitourinary: Negative for dysuria, urgency, frequency, hematuria, flank pain, vaginal bleeding, vaginal discharge, difficulty urinating, vaginal pain and pelvic pain.  Musculoskeletal: Negative for joint swelling, arthralgias and gait problem.  Skin: Negative for rash.  Neurological: Positive for syncope. Negative  for dizziness, speech difficulty, weakness, numbness and headaches.  Hematological: Negative for adenopathy.  Psychiatric/Behavioral: Negative for behavioral problems, dysphoric mood and agitation. The patient is not nervous/anxious.        Objective:   Physical Exam  Constitutional: She is oriented to person, place, and time. She appears well-developed and well-nourished.  HENT:  Head: Normocephalic.  Right Ear: External ear normal.  Left Ear: External ear normal.  Mouth/Throat: Oropharynx is clear and moist.  Eyes: Conjunctivae and EOM are normal. Pupils are equal, round, and reactive to light.  Neck: Normal range of motion. Neck supple. No thyromegaly present.  Cardiovascular: Normal rate, regular rhythm, normal heart sounds and intact distal pulses.   Pulmonary/Chest: Effort normal and breath sounds normal.  Abdominal: Soft. Bowel sounds are normal. She exhibits no mass. There is no tenderness.  Musculoskeletal: Normal range of motion.  Lymphadenopathy:    She has no cervical adenopathy.  Neurological: She is alert and oriented to person, place, and time.  Skin: Skin is warm and dry. No rash noted.  Psychiatric: She has a normal mood and affect. Her behavior is normal.          Assessment & Plan:   History of syncope.  Unremarkable evaluation.  We'll continue to observe Hypothyroidism.  No change in therapy History of mild anemia.  Stable  Annual exam as scheduled.  Patient report any new or worsening symptoms   Rebecca Cowden, MD

## 2015-10-26 NOTE — Progress Notes (Signed)
Pre visit review using our clinic review tool, if applicable. No additional management support is needed unless otherwise documented below in the visit note. 

## 2016-01-07 DIAGNOSIS — D509 Iron deficiency anemia, unspecified: Secondary | ICD-10-CM | POA: Diagnosis not present

## 2016-01-07 DIAGNOSIS — R790 Abnormal level of blood mineral: Secondary | ICD-10-CM | POA: Diagnosis not present

## 2016-01-07 DIAGNOSIS — K5901 Slow transit constipation: Secondary | ICD-10-CM | POA: Diagnosis not present

## 2016-01-07 DIAGNOSIS — Z86010 Personal history of colonic polyps: Secondary | ICD-10-CM | POA: Diagnosis not present

## 2016-01-31 DIAGNOSIS — F3173 Bipolar disorder, in partial remission, most recent episode manic: Secondary | ICD-10-CM | POA: Diagnosis not present

## 2016-02-27 DIAGNOSIS — L309 Dermatitis, unspecified: Secondary | ICD-10-CM | POA: Diagnosis not present

## 2016-02-27 DIAGNOSIS — K130 Diseases of lips: Secondary | ICD-10-CM | POA: Diagnosis not present

## 2016-02-27 DIAGNOSIS — L570 Actinic keratosis: Secondary | ICD-10-CM | POA: Diagnosis not present

## 2016-03-08 ENCOUNTER — Other Ambulatory Visit: Payer: Self-pay | Admitting: Internal Medicine

## 2016-03-17 ENCOUNTER — Ambulatory Visit (INDEPENDENT_AMBULATORY_CARE_PROVIDER_SITE_OTHER): Payer: Medicare Other | Admitting: *Deleted

## 2016-03-17 DIAGNOSIS — Z23 Encounter for immunization: Secondary | ICD-10-CM | POA: Diagnosis not present

## 2016-04-07 ENCOUNTER — Ambulatory Visit (INDEPENDENT_AMBULATORY_CARE_PROVIDER_SITE_OTHER): Payer: Medicare Other | Admitting: Family Medicine

## 2016-04-07 ENCOUNTER — Ambulatory Visit (INDEPENDENT_AMBULATORY_CARE_PROVIDER_SITE_OTHER)
Admission: RE | Admit: 2016-04-07 | Discharge: 2016-04-07 | Disposition: A | Discharge status: Home / Self Care | Payer: Medicare Other | Source: Ambulatory Visit | Attending: Family Medicine | Admitting: Family Medicine

## 2016-04-07 ENCOUNTER — Encounter: Payer: Self-pay | Admitting: Family Medicine

## 2016-04-07 VITALS — BP 118/72 | HR 76 | Temp 98.3°F | Wt 207.6 lb

## 2016-04-07 DIAGNOSIS — M25432 Effusion, left wrist: Secondary | ICD-10-CM

## 2016-04-07 DIAGNOSIS — M7989 Other specified soft tissue disorders: Secondary | ICD-10-CM | POA: Diagnosis not present

## 2016-04-07 DIAGNOSIS — W101XXA Fall (on)(from) sidewalk curb, initial encounter: Secondary | ICD-10-CM | POA: Diagnosis not present

## 2016-04-07 DIAGNOSIS — M25532 Pain in left wrist: Secondary | ICD-10-CM | POA: Diagnosis not present

## 2016-04-07 NOTE — Progress Notes (Signed)
Subjective:    Patient ID: Rebecca Novak, female    DOB: 09/26/1945, 70 y.o.   MRN: [id number]  HPI  Rebecca Novak is a 70 year old female who presents today for evaluation following a mechanical fall 4 days ago that resulted in swelling in her left wrist. She reports fall occurred on her driveway after slipping on wet leaves that had fallen on her driveway. She denies any cardiopulmonary, neurological symptoms prior to this event.  Denies chest pain, palpitations, SOB, dyspnea, syncope, numbness, tingling, weakness, or visual disturbances. She attempted to catch herself during this fall which was down a hill where she sustained multiple contusions mainly associated on her left arm/wrist, knees bilaterally, and lateral aspect of right arm.  She denies hitting her head or LOC. She is here for evaluation of her left wrist due to swelling that has persisted. Swelling has improved but remains present. Pain has improved but she notes pain rated as a 5, described as aching that occurs intermittently if she applies pressure to the area.   Treatment at home includes use of ice packs that has provided moderate benefit.  She currently takes ASA 81 mg daily No aggravating factors noted. Alleviating factor of ice pack and elevation.  She has a history of syncope where she was evaluated by neurology, had a 2-D echocardiogram, and EEG which were all normal. She describes feeling well and denies any syncope or near syncopal episodes. Further denies any cardiopulmonary or neurological symptoms prior to fall or today.   Review of Systems  Constitutional: Negative for chills, fatigue and fever.  Eyes: Negative for visual disturbance.  Respiratory: Negative for cough, shortness of breath and wheezing.   Cardiovascular: Negative for chest pain and palpitations.  Musculoskeletal:       Left wrist swelling  Skin: Positive for color change.       Bruising on left arm/wrist, knees, and right arm  Neurological:  Negative for dizziness, weakness, light-headedness, numbness and headaches.   Past Medical History:  Diagnosis Date  . ANEMIA, MILD 05/17/2008  . COLONIC POLYPS, HX OF 05/17/2007  . Depression    bipolar  . GLUCOSE INTOLERANCE 11/23/2007  . HAND PAIN 07/31/2008  . HYPERLIPIDEMIA 02/05/2007  . HYPOTHYROIDISM 02/05/2007  . Skin cancer    "scraped off chest and RLE" (02/15/2013)  . SYSTOLIC MURMUR [id number]     Social History   Social History  . Marital status: Single    Spouse name: N/A  . Number of children: N/A  . Years of education: N/A   Occupational History  . Not on file.   Social History Main Topics  . Smoking status: Former Smoker    Packs/day: 0.33    Years: 44.00    Types: Cigarettes  . Smokeless tobacco: Never Used     Comment: 02/15/2013 "stopped smoking cigarettes ~ 7 yr ago"  . Alcohol use No  . Drug use: No  . Sexual activity: Yes   Other Topics Concern  . Not on file   Social History Narrative  . No narrative on file    Past Surgical History:  Procedure Laterality Date  . APPENDECTOMY    . TONSILLECTOMY    . VAGINAL HYSTERECTOMY      No family history on file.  No Known Allergies  Current Outpatient Prescriptions on File Prior to Visit  Medication Sig Dispense Refill  . Acetylcysteine (N-ACETYL-L-CYSTEINE PO) Take by mouth.    Marland Kitchen aspirin 81 MG tablet Take 81 mg by  mouth daily.      . Calcium Carbonate (CALCIUM 500 PO) Take 2 tablets by mouth daily.      . Carbamazepine (EQUETRO) 300 MG CP12 Take 4 capsules by mouth daily. One capsule [300 mg] at 11:30 AM, one capsule [300 mg] at 7:30 PM, and two capsules [600 mg] at 11:30 PM.    . levothyroxine (SYNTHROID, LEVOTHROID) 75 MCG tablet TAKE ONE TABLET BY MOUTH ONCE DAILY 90 tablet 3  . Misc Natural Products (LUTEIN 20 PO) Take by mouth.    . Omega-3 Fatty Acids (FISH OIL PO) Take by mouth 2 (two) times daily.    Marland Kitchen perphenazine (TRILAFON) 4 MG tablet Take 12 mg by mouth at bedtime.     Marland Kitchen QUEtiapine  (SEROQUEL) 400 MG tablet Take 1,200 mg by mouth at bedtime. Prescribed by Dr. Curt Jews    . SF 5000 PLUS 1.1 % CREA dental cream Place 1 application onto teeth at bedtime.     . simvastatin (ZOCOR) 40 MG tablet Take 1 tablet (40 mg total) by mouth at bedtime. 90 tablet 3  . zolpidem (AMBIEN) 10 MG tablet Take 5-10 mg by mouth at bedtime as needed (Prescribed by Dr. Clovis Pu).      No current facility-administered medications on file prior to visit.     BP 118/72 (BP Location: Right Arm, Patient Position: Sitting, Cuff Size: Normal)   Pulse 76   Temp 98.3 F (36.8 C) (Oral)   Wt 207 lb 9.6 oz (94.2 kg)   SpO2 97%   BMI 31.57 kg/m       Objective:   Physical Exam  Constitutional: She is oriented to person, place, and time. She appears well-developed and well-nourished.  Eyes: Pupils are equal, round, and reactive to light. No scleral icterus.  Neck: Neck supple.  Cardiovascular: Normal rate, regular rhythm and intact distal pulses.   Pulmonary/Chest: Effort normal and breath sounds normal. She has no wheezes. She has no rales.  Abdominal: Soft. Bowel sounds are normal. There is no tenderness. There is no rebound.  Musculoskeletal:       Left wrist: She exhibits tenderness and swelling.  Areas of ecchymosis present on left and right forearm with edema present over left wrist. She has full ROM of wrist and arms with minimal discomfort during exam.  Lymphadenopathy:    She has no cervical adenopathy.  Neurological: She is alert and oriented to person, place, and time. She has normal strength. She displays normal reflexes. No sensory deficit. Coordination normal.  II-Visual fields grossly intact. III/IV/VI-Extraocular movements intact. Pupils reactive bilaterally. V/VII-Smile symmetric, equal eyebrow raise, facial sensation intact VIII- Hearing grossly intact XI-bilateral shoulder shrug Motor: 5/5 bilaterally with normal tone and bulk Cerebellar: Normal finger-to-nose Romberg  negative Ambulates with a coordinated gait   Skin: Skin is warm and dry.       Assessment & Plan:  1. Wrist swelling, left Edema present with discomfort and ecchymosis following a mechanical fall. Will image wrist to rule out fracture.  - DG Wrist Complete Left; Future  2. Fall (on)(from) sidewalk curb, initial encounter Discussed mechanisms to reduce falls. She has a steep driveway and will contact her neighbor who has offered to assist her with carrying groceries and mail.  Evaluation of wrist X-ray will determine follow up. Exam and history are reassuring, neuro exam is unremarkable, and she denies pain at this time.   Delano Metz, FNP-C

## 2016-04-07 NOTE — Patient Instructions (Signed)
Please go to [place] at [facility] for an X-ray of your wrist. You will be contacted with results in 1 to 2 days.   It you develop any new symptoms, please follow up for further evaluation and treatment.

## 2016-04-15 ENCOUNTER — Ambulatory Visit (INDEPENDENT_AMBULATORY_CARE_PROVIDER_SITE_OTHER): Payer: Medicare Other | Admitting: Internal Medicine

## 2016-04-15 ENCOUNTER — Encounter: Payer: Self-pay | Admitting: Internal Medicine

## 2016-04-15 VITALS — BP 140/78 | HR 73 | Temp 98.9°F | Ht 68.0 in | Wt 206.0 lb

## 2016-04-15 DIAGNOSIS — M79602 Pain in left arm: Secondary | ICD-10-CM

## 2016-04-15 DIAGNOSIS — R55 Syncope and collapse: Secondary | ICD-10-CM | POA: Diagnosis not present

## 2016-04-15 NOTE — Progress Notes (Signed)
Subjective:    Patient ID: Rebecca Novak, female    DOB: 04/10/1946, 70 y.o.   MRN: [id number]  HPI  70 year old patient who has a history of syncope.  She slipped and sustained a fall approximately 2 weeks ago injuring her left wrist area.  Radiographs were obtained on November 13 that revealed no fracture.  She has had persistent soft tissue swelling and discomfort but both seem to be improving.  Past Medical History:  Diagnosis Date  . ANEMIA, MILD 05/17/2008  . COLONIC POLYPS, HX OF 05/17/2007  . Depression    bipolar  . GLUCOSE INTOLERANCE 11/23/2007  . HAND PAIN 07/31/2008  . HYPERLIPIDEMIA 02/05/2007  . HYPOTHYROIDISM 02/05/2007  . Skin cancer    "scraped off chest and RLE" (02/15/2013)  . SYSTOLIC MURMUR [id number]     Social History   Social History  . Marital status: Single    Spouse name: N/A  . Number of children: N/A  . Years of education: N/A   Occupational History  . Not on file.   Social History Main Topics  . Smoking status: Former Smoker    Packs/day: 0.33    Years: 44.00    Types: Cigarettes  . Smokeless tobacco: Never Used     Comment: 02/15/2013 "stopped smoking cigarettes ~ 7 yr ago"  . Alcohol use No  . Drug use: No  . Sexual activity: Yes   Other Topics Concern  . Not on file   Social History Narrative  . No narrative on file    Past Surgical History:  Procedure Laterality Date  . APPENDECTOMY    . TONSILLECTOMY    . VAGINAL HYSTERECTOMY      No family history on file.  No Known Allergies  Current Outpatient Prescriptions on File Prior to Visit  Medication Sig Dispense Refill  . Acetylcysteine (N-ACETYL-L-CYSTEINE PO) Take by mouth.    Marland Kitchen aspirin 81 MG tablet Take 81 mg by mouth daily.      . Calcium Carbonate (CALCIUM 500 PO) Take 2 tablets by mouth daily.      . Carbamazepine (EQUETRO) 300 MG CP12 Take 4 capsules by mouth daily. One capsule [300 mg] at 11:30 AM, one capsule [300 mg] at 7:30 PM, and two capsules [600 mg] at  11:30 PM.    . levothyroxine (SYNTHROID, LEVOTHROID) 75 MCG tablet TAKE ONE TABLET BY MOUTH ONCE DAILY 90 tablet 3  . Misc Natural Products (LUTEIN 20 PO) Take by mouth.    . Omega-3 Fatty Acids (FISH OIL PO) Take by mouth 2 (two) times daily.    Marland Kitchen perphenazine (TRILAFON) 4 MG tablet Take 12 mg by mouth at bedtime.     Marland Kitchen QUEtiapine (SEROQUEL) 400 MG tablet Take 1,200 mg by mouth at bedtime. Prescribed by Dr. Curt Jews    . SF 5000 PLUS 1.1 % CREA dental cream Place 1 application onto teeth at bedtime.     . simvastatin (ZOCOR) 40 MG tablet Take 1 tablet (40 mg total) by mouth at bedtime. 90 tablet 3  . zolpidem (AMBIEN) 10 MG tablet Take 5-10 mg by mouth at bedtime as needed (Prescribed by Dr. Clovis Pu).      No current facility-administered medications on file prior to visit.     BP 140/78 (BP Location: Left Arm, Patient Position: Sitting, Cuff Size: Normal)   Pulse 73   Temp 98.9 F (37.2 C) (Oral)   Ht 5\' 8"  (1.727 m)   Wt 206 lb (93.4 kg)   SpO2 98%  BMI 31.32 kg/m     Review of Systems  Constitutional: Negative.   Musculoskeletal: Positive for joint swelling.       Objective:   Physical Exam  Constitutional: She appears well-developed and well-nourished. No distress.  Blood pressure 130/80  Musculoskeletal:  Soft tissue swelling and hematoma formation over the dorsal aspect of the distal left arm just proximal to the wrist area.  Extensive ecchymoses were noted involving the forearm area and slightly over the dorsal aspect of the hand.  The patient had full range of motion of the wrist.  Minimal discomfort present over the hematoma formation          Assessment & Plan:   Posttraumatic hematoma left wrist area.  The soft tissue swelling is slowly improving as is her discomfort.  We'll attempt to elevate as much as possible.  She report any clinical worsening.  She is aware of the expected slow resolution of the hematoma.  She report any worsening swelling, pain, fever, or  redness  [name]

## 2016-04-15 NOTE — Progress Notes (Signed)
Pre visit review using our clinic review tool, if applicable. No additional management support is needed unless otherwise documented below in the visit note. 

## 2016-04-15 NOTE — Patient Instructions (Addendum)
Hematoma A hematoma is a collection of blood under the skin, in an organ, in a body space, in a joint space, or in other tissue. The blood can clot to form a lump that you can see and feel. The lump is often firm and may sometimes become sore and tender. Most hematomas get better in a few days to weeks. However, some hematomas may be serious and require medical care. Hematomas can range in size from very small to very large. What are the causes? A hematoma can be caused by a blunt or penetrating injury. It can also be caused by spontaneous leakage from a blood vessel under the skin. Spontaneous leakage from a blood vessel is more likely to occur in older people, especially those taking blood thinners. Sometimes, a hematoma can develop after certain medical procedures. What are the signs or symptoms?  A firm lump on the body.  Possible pain and tenderness in the area.  Bruising.Blue, dark blue, purple-red, or yellowish skin may appear at the site of the hematoma if the hematoma is close to the surface of the skin. For hematomas in deeper tissues or body spaces, the signs and symptoms may be subtle. For example, an intra-abdominal hematoma may cause abdominal pain, weakness, fainting, and shortness of breath. An intracranial hematoma may cause a headache or symptoms such as weakness, trouble speaking, or a change in consciousness. How is this diagnosed? A hematoma can usually be diagnosed based on your medical history and a physical exam. Imaging tests may be needed if your health care provider suspects a hematoma in deeper tissues or body spaces, such as the abdomen, head, or chest. These tests may include ultrasonography or a CT scan. How is this treated? Hematomas usually go away on their own over time. Rarely does the blood need to be drained out of the body. Large hematomas or those that may affect vital organs will sometimes need surgical drainage or monitoring. Follow these instructions at  home:  Apply ice to the injured area:  Put ice in a plastic bag.  Place a towel between your skin and the bag.  Leave the ice on for 20 minutes, 2-3 times a day for the first 1 to 2 days.  After the first 2 days, switch to using warm compresses on the hematoma.  Elevate the injured area to help decrease pain and swelling. Wrapping the area with an elastic bandage may also be helpful. Compression helps to reduce swelling and promotes shrinking of the hematoma. Make sure the bandage is not wrapped too tight.  If your hematoma is on a lower extremity and is painful, crutches may be helpful for a couple days.  Only take over-the-counter or prescription medicines as directed by your health care provider. Get help right away if:  You have increasing pain, or your pain is not controlled with medicine.  You have a fever.  You have worsening swelling or discoloration.  Your skin over the hematoma breaks or starts bleeding.  Your hematoma is in your chest or abdomen and you have weakness, shortness of breath, or a change in consciousness.  Your hematoma is on your scalp (caused by a fall or injury) and you have a worsening headache or a change in alertness or consciousness. This information is not intended to replace advice given to you by your health care provider. Make sure you discuss any questions you have with your health care provider. Document Released: 12/25/2003 Document Revised: 10/18/2015 Document Reviewed: 10/20/2012 Elsevier Interactive Patient   Education  2017 [employer]  

## 2016-04-23 DIAGNOSIS — F3173 Bipolar disorder, in partial remission, most recent episode manic: Secondary | ICD-10-CM | POA: Diagnosis not present

## 2016-04-24 ENCOUNTER — Telehealth: Payer: Self-pay | Admitting: Internal Medicine

## 2016-04-24 NOTE — Telephone Encounter (Signed)
Pt called in concerned about poss blood clot after recent fall.  Transferred pt to [facility].

## 2016-04-24 NOTE — Telephone Encounter (Signed)
[facility] - Client [facility] Patient Name: Rebecca Novak DOB: 14-Jul-1945 Initial Comment Caller States- I fell on the 9th of November. Saw md last week, was dx with a Hematoma left arm, was told to use heat, it is not going down. I think I may have a blood clot Nurse Assessment Nurse: Adella Nissen, RN, Leitha Schuller Date/Time ([place] Time): 04/24/2016 4:42:39 PM Confirm and document reason for call. If symptomatic, describe symptoms. ---Caller states swelling still in the forearm, has a hematoma that will not resolve with heat, has skin color, little reddish tint to the skin color, no pain, slight bruising in appearance, no fever, no short of breath, feels fine overall, feels totally normal, using arm ok, can do household tasks Does the patient have any new or worsening symptoms? ---Yes Will a triage be completed? ---YesRelated visit to physician within the last 2 weeks? ---Yes Does the PT have any chronic conditions? (i.e. diabetes, asthma, etc.) ---No Is this a behavioral health or substance abuse call? ---No Guidelines Guideline Title Affirmed Question Affirmed Notes Bruises Minor bruise Final Disposition User [place], RN, Leitha Schuller Comments Caller wishes to make a routine scheduled appointment with Dr. Burnice Logan, caller interested to see if a cancellation to see him in the next week. Follow-up for previous injury with residual hematoma, no pain. Caller reports that regular hours has had long wait to get caller on the phone for an appointment. Disagree/Comply: ComplyCall Id: [id number]

## 2016-04-25 NOTE — Telephone Encounter (Signed)
Per pt she did not want to come in today. Advised pt that if area/symptoms worsen to seek UC or ED evaluation. Pt voiced understanding.

## 2016-04-25 NOTE — Telephone Encounter (Signed)
Rebecca Novak, I don't see anywhere to schedule her... Can you help? Thanks!

## 2016-04-25 NOTE — Telephone Encounter (Signed)
Rebecca Novak, please schedule her at 12:45 double book her. Thanks.

## 2016-04-25 NOTE — Telephone Encounter (Signed)
Pt scheduled for 04/28/16 @ 12:45. Pt aware. Nothing further needed.

## 2016-04-28 ENCOUNTER — Ambulatory Visit (INDEPENDENT_AMBULATORY_CARE_PROVIDER_SITE_OTHER): Payer: Medicare Other | Admitting: Internal Medicine

## 2016-04-28 ENCOUNTER — Encounter: Payer: Self-pay | Admitting: Internal Medicine

## 2016-04-28 VITALS — BP 164/78 | HR 65 | Temp 98.4°F | Ht 68.0 in | Wt 199.4 lb

## 2016-04-28 DIAGNOSIS — S60212D Contusion of left wrist, subsequent encounter: Secondary | ICD-10-CM | POA: Diagnosis not present

## 2016-04-28 NOTE — Progress Notes (Signed)
Pre visit review using our clinic review tool, if applicable. No additional management support is needed unless otherwise documented below in the visit note. 

## 2016-04-28 NOTE — Progress Notes (Signed)
Subjective:    Patient ID: Rebecca Novak, female    DOB: [birth year], 70 y.o.   MRN: [id number]  HPI  70 year old patient who is seen today in follow-up.  She was seen 2 weeks ago with suspected hematoma involving the dorsal aspect of her left wrist.  Earlier radiographs were negative for fracture.  The soft tissue swelling has not improved.  Denies any pain  Past Medical History:  Diagnosis Date  . ANEMIA, MILD 05/17/2008  . COLONIC POLYPS, HX OF 05/17/2007  . Depression    bipolar  . GLUCOSE INTOLERANCE 11/23/2007  . HAND PAIN 07/31/2008  . HYPERLIPIDEMIA 02/05/2007  . HYPOTHYROIDISM 02/05/2007  . Skin cancer    "scraped off chest and RLE" (02/15/2013)  . SYSTOLIC MURMUR [id number]     Social History   Social History  . Marital status: Single    Spouse name: N/A  . Number of children: N/A  . Years of education: N/A   Occupational History  . Not on file.   Social History Main Topics  . Smoking status: Former Smoker    Packs/day: 0.33    Years: 44.00    Types: Cigarettes  . Smokeless tobacco: Never Used     Comment: 02/15/2013 "stopped smoking cigarettes ~ 7 yr ago"  . Alcohol use No  . Drug use: No  . Sexual activity: Yes   Other Topics Concern  . Not on file   Social History Narrative  . No narrative on file    Past Surgical History:  Procedure Laterality Date  . APPENDECTOMY    . TONSILLECTOMY    . VAGINAL HYSTERECTOMY      No family history on file.  No Known Allergies  Current Outpatient Prescriptions on File Prior to Visit  Medication Sig Dispense Refill  . Acetylcysteine (N-ACETYL-L-CYSTEINE PO) Take by mouth.    Marland Kitchen aspirin 81 MG tablet Take 81 mg by mouth daily.      . Calcium Carbonate (CALCIUM 500 PO) Take 2 tablets by mouth daily.      . Carbamazepine (EQUETRO) 300 MG CP12 Take 4 capsules by mouth daily. One capsule [300 mg] at 11:30 AM, one capsule [300 mg] at 7:30 PM, and two capsules [600 mg] at 11:30 PM.    . levothyroxine (SYNTHROID,  LEVOTHROID) 75 MCG tablet TAKE ONE TABLET BY MOUTH ONCE DAILY 90 tablet 3  . Misc Natural Products (LUTEIN 20 PO) Take by mouth.    . Omega-3 Fatty Acids (FISH OIL PO) Take by mouth 2 (two) times daily.    . QUEtiapine (SEROQUEL) 400 MG tablet Take 1,200 mg by mouth at bedtime. Prescribed by Dr. Curt Jews    . SF 5000 PLUS 1.1 % CREA dental cream Place 1 application onto teeth at bedtime.     . simvastatin (ZOCOR) 40 MG tablet Take 1 tablet (40 mg total) by mouth at bedtime. 90 tablet 3  . zolpidem (AMBIEN) 10 MG tablet Take 5-10 mg by mouth at bedtime as needed (Prescribed by Dr. Clovis Pu).      No current facility-administered medications on file prior to visit.     BP (!) 164/78 (BP Location: Right Arm, Patient Position: Sitting, Cuff Size: Normal)   Pulse 65   Temp 98.4 F (36.9 C) (Oral)   Ht 5\' 8"  (1.727 m)   Wt 199 lb 6.1 oz (90.4 kg)   SpO2 94%   BMI 30.32 kg/m     Review of Systems  Skin: Positive for wound.  Objective:   Physical Exam  Constitutional: She appears well-developed and well-nourished. No distress.  Musculoskeletal:  3 cm firm nodule over the dorsal aspect of the left wrist Full range of motion of the wrist No local tenderness No pain with extension and flexion of the wrist against resistance No discoloration          Assessment & Plan:   Persistent hematoma, left dorsal wrist.  Posttraumatic options discussed including orthopedic referral.  Since patient is essentially asymptomatic.  We'll continue to observe.  If unimproved in 2 weeks or any clinical worsening.  Will call the office for referral  Rebecca Novak

## 2016-04-28 NOTE — Patient Instructions (Addendum)
Call or return to clinic prn if these symptoms worsen or fail to improve as anticipated.   Hematoma A hematoma is a collection of blood. The collection of blood can turn into a hard, painful lump under the skin. Your skin may turn blue or yellow if the hematoma is close to the surface of the skin. Most hematomas get better in a few days to weeks. Some hematomas are serious and need medical care. Hematomas can be very small or very big. Follow these instructions at home:  Apply ice to the injured area:  Put ice in a plastic bag.  Place a towel between your skin and the bag.  Leave the ice on for 20 minutes, 2-3 times a day for the first 1 to 2 days.  After the first 2 days, switch to using warm packs on the injured area.  Raise (elevate) the injured area to lessen pain and puffiness (swelling). You may also wrap the area with an elastic bandage. Make sure the bandage is not wrapped too tight.  If you have a painful hematoma on your leg or foot, you may use crutches for a couple days.  Only take medicines as told by your doctor. Get help right away if:  Your pain gets worse.  Your pain is not controlled with medicine.  You have a fever.  Your puffiness gets worse.  Your skin turns more blue or yellow.  Your skin over the hematoma breaks or starts bleeding.  Your hematoma is in your chest or belly (abdomen) and you are short of breath, feel weak, or have a change in consciousness.  Your hematoma is on your scalp and you have a headache that gets worse or a change in alertness or consciousness. This information is not intended to replace advice given to you by your health care provider. Make sure you discuss any questions you have with your health care provider. Document Released: 06/19/2004 Document Revised: 10/18/2015 Document Reviewed: 10/20/2012 Elsevier Interactive Patient Education  2017 [employer].

## 2016-05-02 ENCOUNTER — Other Ambulatory Visit: Payer: Self-pay

## 2016-05-22 ENCOUNTER — Other Ambulatory Visit: Payer: Self-pay | Admitting: Internal Medicine

## 2016-05-27 DIAGNOSIS — D0439 Carcinoma in situ of skin of other parts of face: Secondary | ICD-10-CM | POA: Diagnosis not present

## 2016-05-27 DIAGNOSIS — Z85828 Personal history of other malignant neoplasm of skin: Secondary | ICD-10-CM | POA: Diagnosis not present

## 2016-05-27 DIAGNOSIS — C44619 Basal cell carcinoma of skin of left upper limb, including shoulder: Secondary | ICD-10-CM | POA: Diagnosis not present

## 2016-05-27 DIAGNOSIS — K130 Diseases of lips: Secondary | ICD-10-CM | POA: Diagnosis not present

## 2016-06-02 DIAGNOSIS — F3173 Bipolar disorder, in partial remission, most recent episode manic: Secondary | ICD-10-CM | POA: Diagnosis not present

## 2016-06-04 DIAGNOSIS — Z1231 Encounter for screening mammogram for malignant neoplasm of breast: Secondary | ICD-10-CM | POA: Diagnosis not present

## 2016-06-04 DIAGNOSIS — Z124 Encounter for screening for malignant neoplasm of cervix: Secondary | ICD-10-CM | POA: Diagnosis not present

## 2016-07-03 DIAGNOSIS — C44619 Basal cell carcinoma of skin of left upper limb, including shoulder: Secondary | ICD-10-CM | POA: Diagnosis not present

## 2016-07-03 DIAGNOSIS — D0439 Carcinoma in situ of skin of other parts of face: Secondary | ICD-10-CM | POA: Diagnosis not present

## 2016-07-08 ENCOUNTER — Telehealth: Payer: Self-pay

## 2016-07-08 DIAGNOSIS — K5901 Slow transit constipation: Secondary | ICD-10-CM | POA: Diagnosis not present

## 2016-07-08 DIAGNOSIS — Z86010 Personal history of colonic polyps: Secondary | ICD-10-CM | POA: Diagnosis not present

## 2016-07-08 NOTE — Telephone Encounter (Signed)
Call to leave a VM regarding the AWV; female who answered stated he would have her call the office and inquire regarding scheduling her wellness visit

## 2016-08-26 DIAGNOSIS — D049 Carcinoma in situ of skin, unspecified: Secondary | ICD-10-CM | POA: Diagnosis not present

## 2016-08-26 DIAGNOSIS — Z85828 Personal history of other malignant neoplasm of skin: Secondary | ICD-10-CM | POA: Diagnosis not present

## 2016-08-26 DIAGNOSIS — L570 Actinic keratosis: Secondary | ICD-10-CM | POA: Diagnosis not present

## 2016-08-28 DIAGNOSIS — F3173 Bipolar disorder, in partial remission, most recent episode manic: Secondary | ICD-10-CM | POA: Diagnosis not present

## 2016-09-26 DIAGNOSIS — E559 Vitamin D deficiency, unspecified: Secondary | ICD-10-CM | POA: Diagnosis not present

## 2016-10-27 DIAGNOSIS — L565 Disseminated superficial actinic porokeratosis (DSAP): Secondary | ICD-10-CM | POA: Diagnosis not present

## 2016-10-27 DIAGNOSIS — D229 Melanocytic nevi, unspecified: Secondary | ICD-10-CM | POA: Diagnosis not present

## 2016-10-27 DIAGNOSIS — L570 Actinic keratosis: Secondary | ICD-10-CM | POA: Diagnosis not present

## 2016-11-20 DIAGNOSIS — F3173 Bipolar disorder, in partial remission, most recent episode manic: Secondary | ICD-10-CM | POA: Diagnosis not present

## 2016-12-31 DIAGNOSIS — D0471 Carcinoma in situ of skin of right lower limb, including hip: Secondary | ICD-10-CM | POA: Diagnosis not present

## 2016-12-31 DIAGNOSIS — L259 Unspecified contact dermatitis, unspecified cause: Secondary | ICD-10-CM | POA: Diagnosis not present

## 2016-12-31 DIAGNOSIS — D492 Neoplasm of unspecified behavior of bone, soft tissue, and skin: Secondary | ICD-10-CM | POA: Diagnosis not present

## 2016-12-31 DIAGNOSIS — L308 Other specified dermatitis: Secondary | ICD-10-CM | POA: Diagnosis not present

## 2017-02-05 DIAGNOSIS — Z4802 Encounter for removal of sutures: Secondary | ICD-10-CM | POA: Diagnosis not present

## 2017-02-17 DIAGNOSIS — Z029 Encounter for administrative examinations, unspecified: Secondary | ICD-10-CM | POA: Diagnosis not present

## 2017-02-27 ENCOUNTER — Ambulatory Visit: Payer: Medicare Other

## 2017-03-01 ENCOUNTER — Other Ambulatory Visit: Payer: Self-pay | Admitting: Internal Medicine

## 2017-03-11 DIAGNOSIS — F3173 Bipolar disorder, in partial remission, most recent episode manic: Secondary | ICD-10-CM | POA: Diagnosis not present

## 2017-04-08 ENCOUNTER — Other Ambulatory Visit: Payer: Self-pay | Admitting: Internal Medicine

## 2017-04-13 ENCOUNTER — Other Ambulatory Visit: Payer: Self-pay | Admitting: Internal Medicine

## 2017-04-13 ENCOUNTER — Telehealth: Payer: Self-pay | Admitting: Family Medicine

## 2017-04-13 MED ORDER — LEVOTHYROXINE SODIUM 75 MCG PO TABS
75.0000 ug | ORAL_TABLET | Freq: Every day | ORAL | 1 refills | Status: DC
Start: 2017-04-13 — End: 2017-07-09

## 2017-04-13 NOTE — Telephone Encounter (Signed)
prescription was changed to a 90 day supply.

## 2017-04-13 NOTE — Telephone Encounter (Signed)
Copied from [place] [id number]. Topic: General - Other >> Apr 13, 2017  1:24 PM Carolyn Stare wrote:       Pt said she picked up the 30 day supply but going forth she ask that the RX be for 90 day supply.   Pt call to request 90 day supply for the following med levothyroxine (SYNTHROID, LEVOTHROID) 75 MCG tablet

## 2017-04-22 DIAGNOSIS — F3173 Bipolar disorder, in partial remission, most recent episode manic: Secondary | ICD-10-CM | POA: Diagnosis not present

## 2017-04-30 ENCOUNTER — Other Ambulatory Visit: Payer: Self-pay

## 2017-06-02 ENCOUNTER — Other Ambulatory Visit: Payer: Self-pay | Admitting: Internal Medicine

## 2017-06-04 NOTE — Telephone Encounter (Signed)
Pt needs an appointment before next refill

## 2017-06-23 ENCOUNTER — Ambulatory Visit: Payer: Self-pay

## 2017-06-23 NOTE — Telephone Encounter (Signed)
Pt calling with c/o of heart racing that usually starts around 8:30 in the evening and lasts 25 minutes. Pt states she has been taking a sleeping pill and that the palpitations will stop. Pt denies palpitations or feeling lightheadedness. Pt states that she has been undergoing more stress and anxiety related to taking care of her husband. She stated that he asks her to pick him up after he falls and that he walks with a walker. She states she is overwhelmed with responsibility with his care. Care advice given and appt made to see PCP 06/26/17 @ 3:15 with Dr Volanda Napoleon.   Reason for Disposition . History of hyperthyroidism or taking thyroid medication . Problems with anxiety or stress  Answer Assessment - Initial Assessment Questions 1. DESCRIPTION: "Please describe your heart rate or heart beat that you are having" (e.g., fast/slow, regular/irregular, skipped or extra beats, "palpitations")     Heart racing 2. ONSET: "When did it start?" (Minutes, hours or days)      3 weeks ago 3. DURATION: "How long does it last" (e.g., seconds, minutes, hours)     Lasts 25 minutes happens every night 4. PATTERN "Does it come and go, or has it been constant since it started?"  "Does it get worse with exertion?"   "Are you feeling it now?"     Comes and goes. Does not get worse with exertion. Not feeling it now.  5. TAP: "Using your hand, can you tap out what you are feeling on a chair or table in front of you, so that I can hear?" (Note: not all patients can do this)       Not having it now. Steady but rapid 6. HEART RATE: "Can you tell me your heart rate?" "How many beats in 15 seconds?"  (Note: not all patients can do this)       Pt cannot perform this 7. RECURRENT SYMPTOM: "Have you ever had this before?" If so, ask: "When was the last time?" and "What happened that time?"      no 8. CAUSE: "What do you think is causing the palpitations?"     anxiety 9. CARDIAC HISTORY: "Do you have any history of heart  disease?" (e.g., heart attack, angina, bypass surgery, angioplasty, arrhythmia)      None  10. OTHER SYMPTOMS: "Do you have any other symptoms?" (e.g., dizziness, chest pain, sweating, difficulty breathing)       None  11. PREGNANCY: "Is there any chance you are pregnant?" "When was your last menstrual period?"       n/a  Protocols used: HEART RATE AND HEARTBEAT QUESTIONS-A-AH

## 2017-06-23 NOTE — Telephone Encounter (Signed)
Noted  

## 2017-06-25 DIAGNOSIS — F3173 Bipolar disorder, in partial remission, most recent episode manic: Secondary | ICD-10-CM | POA: Diagnosis not present

## 2017-06-26 ENCOUNTER — Ambulatory Visit (INDEPENDENT_AMBULATORY_CARE_PROVIDER_SITE_OTHER): Payer: Medicare Other | Admitting: Family Medicine

## 2017-06-26 ENCOUNTER — Encounter: Payer: Self-pay | Admitting: Family Medicine

## 2017-06-26 VITALS — BP 130/62 | HR 72 | Temp 98.1°F | Wt 203.4 lb

## 2017-06-26 DIAGNOSIS — R002 Palpitations: Secondary | ICD-10-CM

## 2017-06-26 NOTE — Progress Notes (Signed)
Subjective:    Patient ID: Rebecca Novak, female    DOB: May 18, 1946, 72 y.o.   MRN: [id number]  Chief Complaint  Patient presents with  . Palpitations    HPI Patient was seen today for ongoing concern.  Patient endorses having "palpitations" times 2 weeks.  When asked where the feeling occurs patient points to her abdomen.  Patient states every night at 9 PM she has "palpitations" and has to lay down.  Patient states she has taken half an Ambien to help her sleep to relieve the discomfort.  Patient asked if she had GERD/reflux symptoms she denies history of this.  Patient denies chest pain, shortness of breath, nausea vomiting, numbness/tingling in upper extremities.  Patient endorses increased stress at home as she caring for her partner who is 95 years old.  Past Medical History:  Diagnosis Date  . ANEMIA, MILD 05/17/2008  . COLONIC POLYPS, HX OF 05/17/2007  . Depression    bipolar  . GLUCOSE INTOLERANCE 11/23/2007  . HAND PAIN 07/31/2008  . HYPERLIPIDEMIA 02/05/2007  . HYPOTHYROIDISM 02/05/2007  . Skin cancer    "scraped off chest and RLE" (02/15/2013)  . SYSTOLIC MURMUR 38/25/0539    No Known Allergies  ROS General: Denies fever, chills, night sweats, changes in weight, changes in appetite HEENT: Denies headaches, ear pain, changes in vision, rhinorrhea, sore throat CV: Denies CP, SOB, orthopnea  + palpitations Pulm: Denies SOB, cough, wheezing GI: Denies abdominal pain, nausea, vomiting, diarrhea, constipation GU: Denies dysuria, hematuria, frequency, vaginal discharge Msk: Denies muscle cramps, joint pains Neuro: Denies weakness, numbness, tingling Skin: Denies rashes, bruising Psych: Denies depression, anxiety, hallucinations     Objective:    Blood pressure 130/62, pulse 72, temperature 98.1 F (36.7 C), temperature source Oral, weight 203 lb 6.4 oz (92.3 kg).   Gen. Pleasant, well-nourished, in no distress, normal affect   HEENT: [place]/AT, face symmetric, no  scleral icterus, PERRLA, nares patent without drainage Neck: No JVD, no thyromegaly, no carotid bruits Lungs: no accessory muscle use, CTAB, no wheezes or rales Cardiovascular: RRR, no m/r/g, no peripheral edema Abdomen: BS present, soft, NT/ND, no hepatosplenomegaly. Neuro:  A&Ox3, CN II-XII intact, normal gait    Wt Readings from Last 3 Encounters:  06/26/17 203 lb 6.4 oz (92.3 kg)  04/28/16 199 lb 6.1 oz (90.4 kg)  04/15/16 206 lb (93.4 kg)    Lab Results  Component Value Date   WBC 4.6 09/28/2015   HGB 11.5 (L) 09/28/2015   HCT 34.3 (L) 09/28/2015   PLT 264.0 09/28/2015   GLUCOSE 107 (H) 09/28/2015   CHOL 228 (H) 02/02/2015   TRIG 92.0 02/02/2015   HDL 74.70 02/02/2015   LDLDIRECT 122.7 12/18/2010   LDLCALC 135 (H) 02/02/2015   ALT 12 09/28/2015   AST 17 09/28/2015   NA 143 09/28/2015   K 4.0 09/28/2015   CL 107 09/28/2015   CREATININE 0.87 09/28/2015   BUN 15 09/28/2015   CO2 26 09/28/2015   TSH 4.40 09/28/2015   INR 1.05 02/15/2013   HGBA1C 5.7 02/02/2015   MICROALBUR <0.7 02/02/2015    Assessment/Plan:  Palpitations  -Discussed possible causes of palpitations.  -EKG similar to prior study on 02/15/13.  No changes, no acute signs of ischemia noted.  Normal sinus rhythm.  VR 65 -PHQ 9 and GAD 7 score 0. -will obtain labs.  Consider Holter monitor. - Plan: EKG 12-Lead, CBC (no diff), TSH, T4, free, Basic metabolic panel -f/u with pcp prn   Grier Mitts,  MD

## 2017-06-26 NOTE — Patient Instructions (Addendum)
Palpitations A palpitation is the feeling that your heart:  Has an uneven (irregular) heartbeat.  Is beating faster than normal.  Is fluttering.  Is skipping a beat.  This is usually not a serious problem. In some cases, you may need more medical tests. Follow these instructions at home:  Avoid: ? Caffeine in coffee, tea, soft drinks, diet pills, and energy drinks. ? Chocolate. ? Alcohol.  Do not use any tobacco products. These include cigarettes, chewing tobacco, and e-cigarettes. If you need help quitting, ask your doctor.  Try to reduce your stress. These things may help: ? Yoga. ? Meditation. ? Physical activity. Swimming, jogging, and walking are good choices. ? A method that helps you use your mind to control things in your body, like heartbeats (biofeedback).  Get plenty of rest and sleep.  Take over-the-counter and prescription medicines only as told by your doctor.  Keep all follow-up visits as told by your doctor. This is important. Contact a doctor if:  Your heartbeat is still fast or uneven after 24 hours.  Your palpitations occur more often. Get help right away if:  You have chest pain.  You feel short of breath.  You have a very bad headache.  You feel dizzy.  You pass out (faint). This information is not intended to replace advice given to you by your health care provider. Make sure you discuss any questions you have with your health care provider. Document Released: 02/19/2008 Document Revised: 10/18/2015 Document Reviewed: 01/25/2015 Elsevier Interactive Patient Education  2018 [employer]  

## 2017-06-27 LAB — CBC WITH DIFFERENTIAL/PLATELET
BASOS PCT: 0.4 %
Basophils Absolute: 22 cells/uL (ref 0–200)
Eosinophils Absolute: 99 cells/uL (ref 15–500)
Eosinophils Relative: 1.8 %
HEMATOCRIT: 28.6 % — AB (ref 35.0–45.0)
Hemoglobin: 8.4 g/dL — ABNORMAL LOW (ref 11.7–15.5)
LYMPHS ABS: 1309 {cells}/uL (ref 850–3900)
MCH: 21.4 pg — ABNORMAL LOW (ref 27.0–33.0)
MCHC: 29.4 g/dL — ABNORMAL LOW (ref 32.0–36.0)
MCV: 73 fL — ABNORMAL LOW (ref 80.0–100.0)
MPV: 9.8 fL (ref 7.5–12.5)
Monocytes Relative: 9 %
NEUTROS PCT: 65 %
Neutro Abs: 3575 cells/uL (ref 1500–7800)
Platelets: 342 10*3/uL (ref 140–400)
RBC: 3.92 10*6/uL (ref 3.80–5.10)
RDW: 16.1 % — AB (ref 11.0–15.0)
Total Lymphocyte: 23.8 %
WBC mixed population: 495 cells/uL (ref 200–950)
WBC: 5.5 10*3/uL (ref 3.8–10.8)

## 2017-06-27 LAB — BASIC METABOLIC PANEL
BUN/Creatinine Ratio: 25 (calc) — ABNORMAL HIGH (ref 6–22)
BUN: 24 mg/dL (ref 7–25)
CALCIUM: 8.9 mg/dL (ref 8.6–10.4)
CO2: 24 mmol/L (ref 20–32)
CREATININE: 0.96 mg/dL — AB (ref 0.60–0.93)
Chloride: 109 mmol/L (ref 98–110)
GLUCOSE: 94 mg/dL (ref 65–99)
Potassium: 5 mmol/L (ref 3.5–5.3)
Sodium: 142 mmol/L (ref 135–146)

## 2017-06-27 LAB — TSH: TSH: 3.16 mIU/L (ref 0.40–4.50)

## 2017-06-27 LAB — T4, FREE: Free T4: 0.7 ng/dL — ABNORMAL LOW (ref 0.8–1.8)

## 2017-07-06 DIAGNOSIS — D229 Melanocytic nevi, unspecified: Secondary | ICD-10-CM | POA: Diagnosis not present

## 2017-07-06 DIAGNOSIS — C44519 Basal cell carcinoma of skin of other part of trunk: Secondary | ICD-10-CM | POA: Diagnosis not present

## 2017-07-06 DIAGNOSIS — D485 Neoplasm of uncertain behavior of skin: Secondary | ICD-10-CM | POA: Diagnosis not present

## 2017-07-06 DIAGNOSIS — L570 Actinic keratosis: Secondary | ICD-10-CM | POA: Diagnosis not present

## 2017-07-06 DIAGNOSIS — C4491 Basal cell carcinoma of skin, unspecified: Secondary | ICD-10-CM

## 2017-07-06 DIAGNOSIS — L820 Inflamed seborrheic keratosis: Secondary | ICD-10-CM | POA: Diagnosis not present

## 2017-07-06 HISTORY — DX: Basal cell carcinoma of skin, unspecified: C44.91

## 2017-07-09 ENCOUNTER — Ambulatory Visit (INDEPENDENT_AMBULATORY_CARE_PROVIDER_SITE_OTHER): Payer: Medicare Other | Admitting: Internal Medicine

## 2017-07-09 ENCOUNTER — Other Ambulatory Visit: Payer: Self-pay | Admitting: Internal Medicine

## 2017-07-09 ENCOUNTER — Encounter: Payer: Self-pay | Admitting: Internal Medicine

## 2017-07-09 VITALS — BP 110/70 | HR 75 | Temp 98.8°F | Ht 68.0 in | Wt 202.4 lb

## 2017-07-09 DIAGNOSIS — E785 Hyperlipidemia, unspecified: Secondary | ICD-10-CM | POA: Diagnosis not present

## 2017-07-09 DIAGNOSIS — E739 Lactose intolerance, unspecified: Secondary | ICD-10-CM

## 2017-07-09 DIAGNOSIS — E039 Hypothyroidism, unspecified: Secondary | ICD-10-CM

## 2017-07-09 MED ORDER — LEVOTHYROXINE SODIUM 100 MCG PO TABS: 75.0000 ug | ORAL_TABLET | Freq: Every day | ORAL | 4 refills | Status: DC

## 2017-07-09 NOTE — Progress Notes (Signed)
Subjective:    Patient ID: Rebecca Novak, female    DOB: 09/18/1945, 72 y.o.   MRN: [id number]  HPI  72 year old patient who has a history of treated hypothyroidism as well as prior history of iron deficiency anemia.  She underwent her last colonoscopy in December 2016.  She was seen recently due to palpitations and laboratory studies revealed a slightly low free T4 as well as significant microcytic anemia. She has a long history of chronic constipation but has noted no change in her bowel habits.  There is been no melena She has been on low-dose aspirin.  Past Medical History:  Diagnosis Date  . ANEMIA, MILD 05/17/2008  . COLONIC POLYPS, HX OF 05/17/2007  . Depression    bipolar  . GLUCOSE INTOLERANCE 11/23/2007  . HAND PAIN 07/31/2008  . HYPERLIPIDEMIA 02/05/2007  . HYPOTHYROIDISM 02/05/2007  . Skin cancer    "scraped off chest and RLE" (02/15/2013)  . SYSTOLIC MURMUR 18/29/9371     Social History   Socioeconomic History  . Marital status: Single    Spouse name: Not on file  . Number of children: Not on file  . Years of education: Not on file  . Highest education level: Not on file  Social Needs  . Financial resource strain: Not on file  . Food insecurity - worry: Not on file  . Food insecurity - inability: Not on file  . Transportation needs - medical: Not on file  . Transportation needs - non-medical: Not on file  Occupational History  . Not on file  Tobacco Use  . Smoking status: Former Smoker    Packs/day: 0.33    Years: 44.00    Pack years: 14.52    Types: Cigarettes  . Smokeless tobacco: Never Used  . Tobacco comment: 02/15/2013 "stopped smoking cigarettes ~ 7 yr ago"  Substance and Sexual Activity  . Alcohol use: No  . Drug use: No  . Sexual activity: Yes  Other Topics Concern  . Not on file  Social History Narrative  . Not on file    Past Surgical History:  Procedure Laterality Date  . APPENDECTOMY    . TONSILLECTOMY    . VAGINAL HYSTERECTOMY       History reviewed. No pertinent family history.  No Known Allergies  Current Outpatient Medications on File Prior to Visit  Medication Sig Dispense Refill  . Acetylcysteine (N-ACETYL-L-CYSTEINE PO) Take by mouth.    Marland Kitchen aspirin 81 MG tablet Take 81 mg by mouth daily.      . Calcium Carbonate (CALCIUM 500 PO) Take 2 tablets by mouth daily.      . Carbamazepine (EQUETRO) 300 MG CP12 Take 4 capsules by mouth daily. One capsule [300 mg] at 11:30 AM, one capsule [300 mg] at 7:30 PM, and two capsules [600 mg] at 11:30 PM.    . LATUDA 120 MG TABS     . levothyroxine (SYNTHROID, LEVOTHROID) 75 MCG tablet Take 1 tablet (75 mcg total) daily by mouth. 90 tablet 1  . lurasidone (LATUDA) 40 MG TABS tablet Take 40 mg by mouth daily with breakfast.    . Misc Natural Products (LUTEIN 20 PO) Take by mouth.    . Omega-3 Fatty Acids (FISH OIL PO) Take by mouth 2 (two) times daily.    . QUEtiapine (SEROQUEL) 400 MG tablet Take 1,200 mg by mouth at bedtime. Prescribed by Dr. Curt Jews    . SF 5000 PLUS 1.1 % CREA dental cream Place 1 application onto teeth  at bedtime.     . simvastatin (ZOCOR) 40 MG tablet TAKE ONE TABLET BY MOUTH AT BEDTIME 60 tablet 0  . zolpidem (AMBIEN) 10 MG tablet Take 5-10 mg by mouth at bedtime as needed (Prescribed by Dr. Clovis Pu).      No current facility-administered medications on file prior to visit.     BP 110/70 (BP Location: Left Arm, Patient Position: Sitting, Cuff Size: Normal)   Pulse 75   Temp 98.8 F (37.1 C) (Oral)   Ht 5\' 8"  (1.727 m)   Wt 202 lb 6.4 oz (91.8 kg)   SpO2 95%   BMI 30.77 kg/m     Review of Systems  Constitutional: Positive for fatigue.  HENT: Negative for congestion, dental problem, hearing loss, rhinorrhea, sinus pressure, sore throat and tinnitus.   Eyes: Negative for pain, discharge and visual disturbance.  Respiratory: Negative for cough and shortness of breath.   Cardiovascular: Positive for palpitations. Negative for chest pain and leg  swelling.  Gastrointestinal: Negative for abdominal distention, abdominal pain, blood in stool, constipation, diarrhea, nausea and vomiting.  Genitourinary: Negative for difficulty urinating, dysuria, flank pain, frequency, hematuria, pelvic pain, urgency, vaginal bleeding, vaginal discharge and vaginal pain.  Musculoskeletal: Negative for arthralgias, gait problem and joint swelling.  Skin: Negative for rash.  Neurological: Negative for dizziness, syncope, speech difficulty, weakness, numbness and headaches.  Hematological: Negative for adenopathy.  Psychiatric/Behavioral: Negative for agitation, behavioral problems and dysphoric mood. The patient is not nervous/anxious.        Objective:   Physical Exam  Constitutional: She is oriented to person, place, and time. She appears well-developed and well-nourished.  Blood pressure 100/70  HENT:  Head: Normocephalic.  Right Ear: External ear normal.  Left Ear: External ear normal.  Mouth/Throat: Oropharynx is clear and moist.  Eyes: Conjunctivae and EOM are normal. Pupils are equal, round, and reactive to light.  Neck: Normal range of motion. Neck supple. No thyromegaly present.  Cardiovascular: Normal rate, regular rhythm, normal heart sounds and intact distal pulses.  Pulmonary/Chest: Effort normal and breath sounds normal.  Abdominal: Soft. Bowel sounds are normal. She exhibits no distension and no mass. There is no tenderness. There is no rebound.  Musculoskeletal: Normal range of motion.  Lymphadenopathy:    She has no cervical adenopathy.  Neurological: She is alert and oriented to person, place, and time.  Skin: Skin is warm and dry. No rash noted.  Psychiatric: She has a normal mood and affect. Her behavior is normal.          Assessment & Plan:   Microcytic anemia.  Probable iron deficiency anemia we will check stool for occult blood x4 and start on iron supplements.  Recheck 4 weeks Hypothyroidism.  Will increase  levothyroxine to 100 mcg daily Chronic constipation.  Patient will use MiraLAX as needed  Aspirin therapy will be discontinued permanently  Follow-up in 4 weeks  Atlee Kluth Pilar Plate

## 2017-07-09 NOTE — Patient Instructions (Addendum)
Increase levothyroxine to 100 mcg daily  Iron sulfate 1 tablet twice daily  MiraLAX as needed for constipation  Discontinue aspirin permanently  Follow-up 4 weeks  Return slides to check stool for hidden blood

## 2017-07-27 DIAGNOSIS — F3173 Bipolar disorder, in partial remission, most recent episode manic: Secondary | ICD-10-CM | POA: Diagnosis not present

## 2017-08-05 ENCOUNTER — Ambulatory Visit (INDEPENDENT_AMBULATORY_CARE_PROVIDER_SITE_OTHER): Payer: Medicare Other

## 2017-08-05 ENCOUNTER — Encounter: Payer: Self-pay | Admitting: Internal Medicine

## 2017-08-05 ENCOUNTER — Ambulatory Visit (INDEPENDENT_AMBULATORY_CARE_PROVIDER_SITE_OTHER): Payer: Medicare Other | Admitting: Internal Medicine

## 2017-08-05 VITALS — BP 110/62 | HR 70 | Temp 98.3°F | Wt 199.6 lb

## 2017-08-05 VITALS — BP 110/62 | Ht 68.0 in | Wt 199.0 lb

## 2017-08-05 DIAGNOSIS — E034 Atrophy of thyroid (acquired): Secondary | ICD-10-CM | POA: Diagnosis not present

## 2017-08-05 DIAGNOSIS — R002 Palpitations: Secondary | ICD-10-CM

## 2017-08-05 DIAGNOSIS — D500 Iron deficiency anemia secondary to blood loss (chronic): Secondary | ICD-10-CM | POA: Diagnosis not present

## 2017-08-05 DIAGNOSIS — Z0000 Encounter for general adult medical examination without abnormal findings: Secondary | ICD-10-CM | POA: Diagnosis not present

## 2017-08-05 LAB — CBC WITH DIFFERENTIAL/PLATELET
BASOS ABS: 0 10*3/uL (ref 0.0–0.1)
Basophils Relative: 1 % (ref 0.0–3.0)
EOS ABS: 0.1 10*3/uL (ref 0.0–0.7)
Eosinophils Relative: 2.5 % (ref 0.0–5.0)
HCT: 30.8 % — ABNORMAL LOW (ref 36.0–46.0)
Hemoglobin: 9.6 g/dL — ABNORMAL LOW (ref 12.0–15.0)
LYMPHS ABS: 1.2 10*3/uL (ref 0.7–4.0)
Lymphocytes Relative: 27.2 % (ref 12.0–46.0)
MCHC: 31.3 g/dL (ref 30.0–36.0)
MONO ABS: 0.5 10*3/uL (ref 0.1–1.0)
Monocytes Relative: 10.8 % (ref 3.0–12.0)
NEUTROS ABS: 2.6 10*3/uL (ref 1.4–7.7)
NEUTROS PCT: 58.5 % (ref 43.0–77.0)
PLATELETS: 327 10*3/uL (ref 150.0–400.0)
RBC: 4.43 Mil/uL (ref 3.87–5.11)
RDW: 20.7 % — ABNORMAL HIGH (ref 11.5–15.5)
WBC: 4.5 10*3/uL (ref 4.0–10.5)

## 2017-08-05 NOTE — Patient Instructions (Signed)
Please return/mail  slides to check stool for hidden blood  Take an iron supplement 3 times daily  Avoid aspirin and anti-inflammatory medications such as Advil or Aleve

## 2017-08-05 NOTE — Progress Notes (Signed)
   Subjective:    Patient ID: Rebecca Novak, female    DOB: 10-31-1945, 72 y.o.   MRN: [id number]  HPI 72 year old patient who is seen today for follow-up of iron deficiency anemia. She presented with palpitations and laboratory studies included a hemoglobin of 8.4 g%.  She was asked to return stool for occult blood.  She has done the stool specimens but left the slides at home.  She has been on supplemental iron but only 1 tablet for the past week; her bowel habits have been normal;  she generally feels well  Her last colonoscopy was only 2 years ago   She did have another episode of palpitations last night the lasted 6 or 7 minutes    Review of Systems  Constitutional: Negative.   HENT: Negative for congestion, dental problem, hearing loss, rhinorrhea, sinus pressure, sore throat and tinnitus.   Eyes: Negative for pain, discharge and visual disturbance.  Respiratory: Negative for cough and shortness of breath.   Cardiovascular: Positive for palpitations. Negative for chest pain and leg swelling.  Gastrointestinal: Negative for abdominal distention, abdominal pain, blood in stool, constipation, diarrhea, nausea and vomiting.  Genitourinary: Negative for difficulty urinating, dysuria, flank pain, frequency, hematuria, pelvic pain, urgency, vaginal bleeding, vaginal discharge and vaginal pain.  Musculoskeletal: Negative for arthralgias, gait problem and joint swelling.  Skin: Negative for rash.  Neurological: Negative for dizziness, syncope, speech difficulty, weakness, numbness and headaches.  Hematological: Negative for adenopathy.  Psychiatric/Behavioral: Negative for agitation, behavioral problems and dysphoric mood. The patient is not nervous/anxious.        Objective:   Physical Exam  Constitutional: She is oriented to person, place, and time. She appears well-developed and well-nourished.  HENT:  Head: Normocephalic.  Right Ear: External ear normal.  Left Ear:  External ear normal.  Mouth/Throat: Oropharynx is clear and moist.  Eyes: Conjunctivae and EOM are normal. Pupils are equal, round, and reactive to light.  Neck: Normal range of motion. Neck supple. No thyromegaly present.  Cardiovascular: Normal rate, regular rhythm, normal heart sounds and intact distal pulses.  No ectopics  Pulmonary/Chest: Effort normal and breath sounds normal.  Abdominal: Soft. Bowel sounds are normal. She exhibits no mass. There is no tenderness.  Musculoskeletal: Normal range of motion.  Lymphadenopathy:    She has no cervical adenopathy.  Neurological: She is alert and oriented to person, place, and time.  Skin: Skin is warm and dry. No rash noted.  Psychiatric: She has a normal mood and affect. Her behavior is normal.          Assessment & Plan:  On deficiency anemia.  Aspirin therapy has been discontinued. Patient has been asked to mail slides to check stool for occult blood to the office.  Will review a CBC today. Patient has been asked to take an iron supplement 3 times daily.  Recheck in 4 weeks  Palpitations.  Possibly aggravated by anemia.  Patient has been asked to check her pulse when she becomes symptomatic.  Will reassess in 1 month  [place]

## 2017-08-05 NOTE — Progress Notes (Signed)
   Subjective:   error Note was entered during lost connectivity. See other note

## 2017-08-05 NOTE — Progress Notes (Addendum)
Subjective:   NATARA MONFORT is a 72 y.o. female who presents for Medicare Annual (Subsequent) preventive examination.  Reports health as fair  Caring for spouse Still having a lot of palpitations   Advised to start iron 3 times per day  Lives with spouse who is "not doing well at all" Had 2 kinds of cancer;  Home care is coming in; PT is helping him  They are walking him around  Still Independent  No children  Sister is in [place] with 2 cats  Would love to travel  Diet BMI 30  Chol/hdl ratio 3; hdl 74;  A1c 5.7 checked in 2016; BS normal 06/2017 Appetite good Cooks a lot Breakfast skips some Lunch and dinner  Exercise Now that the weather is changing Walks around her home   There are no preventive care reminders to display for this patient. Still has mammograms but is "behind" Goes to [place] and had to cancel her last apt To Reschedule  mammogram with GYN; Dr. Pamala Hurry Will defer dexa follow up to Dr. Pamala Hurry   Colonoscopy to repeat in June 2019 per [facility] office  Tobacco 14 pack years; quit date 01/2013; does not meet criteria for LDCT    Cardiac Risk Factors include: advanced age (>56men, >34 women);dyslipidemia     Objective:     Vitals: BP 110/62   Ht 5\' 8"  (1.727 m)   Wt 199 lb (90.3 kg)   BMI 30.26 kg/m   Body mass index is 30.26 kg/m.  Advanced Directives 08/05/2017 02/15/2013  Does Patient Have a Medical Advance Directive? Yes Patient has advance directive, copy not in chart  Copy of [place] in Chart? - Copy requested from other (Comment)    Tobacco Social History   Tobacco Use  Smoking Status Former Smoker  . Packs/day: 0.33  . Years: 44.00  . Pack years: 14.52  . Types: Cigarettes  Smokeless Tobacco Never Used  Tobacco Comment   02/15/2013 "stopped smoking cigarettes ~ 7 yr ago"     Counseling given: Yes Comment: 02/15/2013 "stopped smoking cigarettes ~ 7 yr ago"   Clinical Intake:    Past  Medical History:  Diagnosis Date  . ANEMIA, MILD 05/17/2008  . COLONIC POLYPS, HX OF 05/17/2007  . Depression    bipolar  . GLUCOSE INTOLERANCE 11/23/2007  . HAND PAIN 07/31/2008  . HYPERLIPIDEMIA 02/05/2007  . HYPOTHYROIDISM 02/05/2007  . Skin cancer    "scraped off chest and RLE" (02/15/2013)  . SYSTOLIC MURMUR 64/33/2951   Past Surgical History:  Procedure Laterality Date  . APPENDECTOMY    . TONSILLECTOMY    . VAGINAL HYSTERECTOMY     No family history on file. Social History   Socioeconomic History  . Marital status: Single    Spouse name: Not on file  . Number of children: Not on file  . Years of education: Not on file  . Highest education level: Not on file  Social Needs  . Financial resource strain: Not on file  . Food insecurity - worry: Not on file  . Food insecurity - inability: Not on file  . Transportation needs - medical: Not on file  . Transportation needs - non-medical: Not on file  Occupational History  . Not on file  Tobacco Use  . Smoking status: Former Smoker    Packs/day: 0.33    Years: 44.00    Pack years: 14.52    Types: Cigarettes  . Smokeless tobacco: Never Used  .  Tobacco comment: 02/15/2013 "stopped smoking cigarettes ~ 7 yr ago"  Substance and Sexual Activity  . Alcohol use: No  . Drug use: No  . Sexual activity: Yes  Other Topics Concern  . Not on file  Social History Narrative  . Not on file    Outpatient Encounter Medications as of 08/05/2017  Medication Sig  . aspirin 81 MG tablet Take 81 mg by mouth daily.    . Calcium Carbonate (CALCIUM 500 PO) Take 2 tablets by mouth daily.    . Carbamazepine (EQUETRO) 300 MG CP12 Take 4 capsules by mouth daily. One capsule [300 mg] at 11:30 AM, one capsule [300 mg] at 7:30 PM, and two capsules [600 mg] at 11:30 PM.  . LATUDA 120 MG TABS   . levothyroxine (SYNTHROID, LEVOTHROID) 100 MCG tablet Take 1 tablet (100 mcg total) by mouth daily.  Marland Kitchen lurasidone (LATUDA) 40 MG TABS tablet Take 40 mg by  mouth daily with breakfast.  . Misc Natural Products (LUTEIN 20 PO) Take by mouth.  . Omega-3 Fatty Acids (FISH OIL PO) Take by mouth 2 (two) times daily.  . QUEtiapine (SEROQUEL) 400 MG tablet Take 1,200 mg by mouth at bedtime. Prescribed by Dr. Curt Jews  . SF 5000 PLUS 1.1 % CREA dental cream Place 1 application onto teeth at bedtime.   . simvastatin (ZOCOR) 40 MG tablet TAKE ONE TABLET BY MOUTH AT BEDTIME  . zolpidem (AMBIEN) 10 MG tablet Take 5-10 mg by mouth at bedtime as needed (Prescribed by Dr. Clovis Pu).   . [DISCONTINUED] Acetylcysteine (N-ACETYL-L-CYSTEINE PO) Take by mouth.   No facility-administered encounter medications on file as of 08/05/2017.     Activities of Daily Living In your present state of health, do you have any difficulty performing the following activities: 08/05/2017  Hearing? N  Vision? N  Difficulty concentrating or making decisions? N  Walking or climbing stairs? N  Dressing or bathing? N  Doing errands, shopping? N  Preparing Food and eating ? N  Using the Toilet? N  In the past six months, have you accidently leaked urine? N  Do you have problems with loss of bowel control? N  Managing your Medications? N  Managing your Finances? N  Some recent data might be hidden    Patient Care Team: Marletta Lor, MD as PCP - General    Assessment:   This is a routine wellness examination for [facility].  Exercise Activities and Dietary recommendations Current Exercise Habits: Home exercise routine, Time (Minutes): 30, Frequency (Times/Week): 3, Weekly Exercise (Minutes/Week): 90, Intensity: Moderate  Goals    . Exercise 150 min/wk Moderate Activity     Will start walking in the neighborhood. In the early afternoon        Fall Risk Fall Risk  08/05/2017 04/30/2017 05/02/2016 04/07/2016 02/07/2015  Falls in the past year? No No No Yes No  Comment - Emmi Telephone Survey: data to providers prior to load Emmi Telephone Survey: data to providers prior to  load - -  Number falls in past yr: - - - 2 or more -  Injury with Fall? - - - Yes -     Depression Screen PHQ 2/9 Scores 08/05/2017 06/26/2017 04/07/2016 02/07/2015  PHQ - 2 Score 0 0 0 0  PHQ- 9 Score - 0 - -     Cognitive Function MMSE - Mini Mental State Exam 08/05/2017  Not completed: (No Data)   Ad8 score reviewed for issues:  Issues making decisions:  Less interest  in hobbies / activities:  Repeats questions, stories (family complaining):  Trouble using ordinary gadgets (microwave, computer, phone):  Forgets the month or year:   Mismanaging finances:   Remembering appts:  Daily problems with thinking and/or memory: Ad8 score is=0    Immunization History  Administered Date(s) Administered  . Influenza Split 03/06/2011, 03/16/2012  . Influenza Whole 03/05/2007, 03/07/2008, 02/22/2009, 02/14/2010  . Influenza, High Dose Seasonal PF 03/06/2015, 03/17/2016, 02/26/2017  . Influenza,inj,Quad PF,6+ Mos 03/18/2013, 03/23/2014  . Pneumococcal Conjugate-13 02/13/2014  . Pneumococcal Polysaccharide-23 12/18/2010  . Td 11/08/2008  . Tdap 01/04/2012  . Zoster 05/16/2015      Screening Tests Health Maintenance  Topic Date Due  . MAMMOGRAM  12/24/2018 (Originally 05/08/2017)  . TETANUS/TDAP  01/03/2022  . COLONOSCOPY  05/14/2025  . INFLUENZA VACCINE  Completed  . DEXA SCAN  Completed  . Hepatitis C Screening  Completed  . PNA vac Low Risk Adult  Completed        Plan:      PCP Notes   Health Maintenance To fup with Dr. Pamala Hurry for mammogram and fup on dexa as appropriate. Had to cancel her apt but will reschedule this sping  Colonoscopy 04/2015 and plan to recall around 10/2017 per the [facility] office.   Educated regarding shingrix   Abnormal Screens  None   Referrals  none  Patient concerns; Feels she is doing well; having palpitations at night. Spouse has been ill but states he is doing better with PT in the home.   Nurse Concerns; As noted    Next PCP apt Was fup today/ labs were drawn To start iron tid per her report     I have personally reviewed and noted the following in the patient's chart:   . Medical and social history . Use of alcohol, tobacco or illicit drugs  . Current medications and supplements . Functional ability and status . Nutritional status . Physical activity . Advanced directives . List of other physicians . Hospitalizations, surgeries, and ER visits in previous 12 months . Vitals . Screenings to include cognitive, depression, and falls . Referrals and appointments  In addition, I have reviewed and discussed with patient certain preventive protocols, quality metrics, and best practice recommendations. A written personalized care plan for preventive services as well as general preventive health recommendations were provided to patient.     NKNLZ,JQBHA, RN  08/05/2017  Results of this patient's subsequent Medicare wellness visit reviewed and agree with findings.  [name]

## 2017-08-05 NOTE — Patient Instructions (Addendum)
Rebecca Novak , Thank you for taking time to come for your Medicare Wellness Visit. I appreciate your ongoing commitment to your health goals. Please review the following plan we discussed and let me know if I can assist you in the future.   Will fup with Dr. Pamala Hurry for [facility] and any repeat of your bone density.  Can fup with [facility] regarding return visit; for now, we have you on a 10 year plan; (2026)  (call to [facility] and confirmed last date of 05/15/15 with repeat June 2019 - will let the patient know)   Shingrix is a vaccine for the prevention of Shingles in Adults 22 and older.  If you are on Medicare, you can request a prescription from your doctor to be filled at a pharmacy.  Please check with your benefits regarding applicable copays or out of pocket expenses.  The Shingrix is given in 2 vaccines approx 8 weeks apart. You must receive the 2nd dose prior to 6 months from receipt of the first.  You had your zoster 04/2015   These are the goals we discussed: Goals    . Exercise 150 min/wk Moderate Activity     Will start walking in the neighborhood. In the early afternoon        This is a list of the screening recommended for you and due dates:  Health Maintenance  Topic Date Due  . Mammogram  05/08/2017  . Tetanus Vaccine  01/03/2022  . Colon Cancer Screening  05/14/2025  . Flu Shot  Completed  . DEXA scan (bone density measurement)  Completed  .  Hepatitis C: One time screening is recommended by Center for Disease Control  (CDC) for  adults born from [birth year] through 1967.   Completed  . Pneumonia vaccines  Completed   Prevention of falls: Remove rugs or any tripping hazards in the home Use Non slip mats in bathtubs and showers Placing grab bars next to the toilet and or shower Placing handrails on both sides of the stair way Adding extra lighting in the home.   Personal safety issues reviewed:  1. Consider starting a community watch program per [facility] 2.  Changes batteries is smoke detector and/or carbon monoxide detector  3.  If you have firearms; keep them in a safe place 4.  Wear protection when in the sun; Always wear sunscreen or a hat; It is good to have your doctor check your skin annually or review any new areas of concern 5. Driving safety; Keep in the right lane; stay 3 car lengths behind the car in front of you on the highway; look 3 times prior to pulling out; carry your cell phone everywhere you go!      Bone Densitometry Bone densitometry is an imaging test that uses a special X-ray to measure the amount of calcium and other minerals in your bones (bone density). This test is also known as a bone mineral density test or dual-energy X-ray absorptiometry (DXA). The test can measure bone density at your hip and your spine. It is similar to having a regular X-ray. You may have this test to:  Diagnose a condition that causes weak or thin bones (osteoporosis).  Predict your risk of a broken bone (fracture).  Determine how well osteoporosis treatment is working.  Tell a health care provider about:  Any allergies you have.  All medicines you are taking, including vitamins, herbs, eye drops, creams, and over-the-counter medicines.  Any problems you or family  members have had with anesthetic medicines.  Any blood disorders you have.  Any surgeries you have had.  Any medical conditions you have.  Possibility of pregnancy.  Any other medical test you had within the previous 14 days that used contrast material. What are the risks? Generally, this is a safe procedure. However, problems can occur and may include the following:  This test exposes you to a very small amount of radiation.  The risks of radiation exposure may be greater to unborn children.  What happens before the procedure?  Do not take any calcium supplements for 24 hours before having the test. You can otherwise eat and drink what you usually  do.  Take off all metal jewelry, eyeglasses, dental appliances, and any other metal objects. What happens during the procedure?  You may lie on an exam table. There will be an X-ray generator below you and an imaging device above you.  Other devices, such as boxes or braces, may be used to position your body properly for the scan.  You will need to lie still while the machine slowly scans your body.  The images will show up on a computer monitor. What happens after the procedure? You may need more testing at a later time. This information is not intended to replace advice given to you by your health care provider. Make sure you discuss any questions you have with your health care provider. Document Released: 06/03/2004 Document Revised: 10/18/2015 Document Reviewed: 10/20/2013 Elsevier Interactive Patient Education  2018 [place] in the Home Falls can cause injuries. They can happen to people of all ages. There are many things you can do to make your home safe and to help prevent falls. What can I do on the outside of my home?  Regularly fix the edges of walkways and driveways and fix any cracks.  Remove anything that might make you trip as you walk through a door, such as a raised step or threshold.  Trim any bushes or trees on the path to your home.  Use bright outdoor lighting.  Clear any walking paths of anything that might make someone trip, such as rocks or tools.  Regularly check to see if handrails are loose or broken. Make sure that both sides of any steps have handrails.  Any raised decks and porches should have guardrails on the edges.  Have any leaves, snow, or ice cleared regularly.  Use sand or salt on walking paths during winter.  Clean up any spills in your garage right away. This includes oil or grease spills. What can I do in the bathroom?  Use night lights.  Install grab bars by the toilet and in the tub and shower. Do not use  towel bars as grab bars.  Use non-skid mats or decals in the tub or shower.  If you need to sit down in the shower, use a plastic, non-slip stool.  Keep the floor dry. Clean up any water that spills on the floor as soon as it happens.  Remove soap buildup in the tub or shower regularly.  Attach bath mats securely with double-sided non-slip rug tape.  Do not have throw rugs and other things on the floor that can make you trip. What can I do in the bedroom?  Use night lights.  Make sure that you have a light by your bed that is easy to reach.  Do not use any sheets or blankets that are too big for your  bed. They should not hang down onto the floor.  Have a firm chair that has side arms. You can use this for support while you get dressed.  Do not have throw rugs and other things on the floor that can make you trip. What can I do in the kitchen?  Clean up any spills right away.  Avoid walking on wet floors.  Keep items that you use a lot in easy-to-reach places.  If you need to reach something above you, use a strong step stool that has a grab bar.  Keep electrical cords out of the way.  Do not use floor polish or wax that makes floors slippery. If you must use wax, use non-skid floor wax.  Do not have throw rugs and other things on the floor that can make you trip. What can I do with my stairs?  Do not leave any items on the stairs.  Make sure that there are handrails on both sides of the stairs and use them. Fix handrails that are broken or loose. Make sure that handrails are as long as the stairways.  Check any carpeting to make sure that it is firmly attached to the stairs. Fix any carpet that is loose or worn.  Avoid having throw rugs at the top or bottom of the stairs. If you do have throw rugs, attach them to the floor with carpet tape.  Make sure that you have a light switch at the top of the stairs and the bottom of the stairs. If you do not have them, ask  someone to add them for you. What else can I do to help prevent falls?  Wear shoes that: ? Do not have high heels. ? Have rubber bottoms. ? Are comfortable and fit you well. ? Are closed at the toe. Do not wear sandals.  If you use a stepladder: ? Make sure that it is fully opened. Do not climb a closed stepladder. ? Make sure that both sides of the stepladder are locked into place. ? Ask someone to hold it for you, if possible.  Clearly mark and make sure that you can see: ? Any grab bars or handrails. ? First and last steps. ? Where the edge of each step is.  Use tools that help you move around (mobility aids) if they are needed. These include: ? Canes. ? Walkers. ? Scooters. ? Crutches.  Turn on the lights when you go into a dark area. Replace any light bulbs as soon as they burn out.  Set up your furniture so you have a clear path. Avoid moving your furniture around.  If any of your floors are uneven, fix them.  If there are any pets around you, be aware of where they are.  Review your medicines with your doctor. Some medicines can make you feel dizzy. This can increase your chance of falling. Ask your doctor what other things that you can do to help prevent falls. This information is not intended to replace advice given to you by your health care provider. Make sure you discuss any questions you have with your health care provider. Document Released: 03/08/2009 Document Revised: 10/18/2015 Document Reviewed: 06/16/2014 Elsevier Interactive Patient Education  2018 [place] Maintenance, Female Adopting a healthy lifestyle and getting preventive care can go a long way to promote health and wellness. Talk with your health care provider about what schedule of regular examinations is right for you. This is a good chance for you to  check in with your provider about disease prevention and staying healthy. In between checkups, there are plenty of things you can do  on your own. Experts have done a lot of research about which lifestyle changes and preventive measures are most likely to keep you healthy. Ask your health care provider for more information. Weight and diet Eat a healthy diet  Be sure to include plenty of vegetables, fruits, low-fat dairy products, and lean protein.  Do not eat a lot of foods high in solid fats, added sugars, or salt.  Get regular exercise. This is one of the most important things you can do for your health. ? Most adults should exercise for at least 150 minutes each week. The exercise should increase your heart rate and make you sweat (moderate-intensity exercise). ? Most adults should also do strengthening exercises at least twice a week. This is in addition to the moderate-intensity exercise.  Maintain a healthy weight  Body mass index (BMI) is a measurement that can be used to identify possible weight problems. It estimates body fat based on height and weight. Your health care provider can help determine your BMI and help you achieve or maintain a healthy weight.  For females 31 years of age and older: ? A BMI below 18.5 is considered underweight. ? A BMI of 18.5 to 24.9 is normal. ? A BMI of 25 to 29.9 is considered overweight. ? A BMI of 30 and above is considered obese.  Watch levels of cholesterol and blood lipids  You should start having your blood tested for lipids and cholesterol at 74 years of age, then have this test every 5 years.  You may need to have your cholesterol levels checked more often if: ? Your lipid or cholesterol levels are high. ? You are older than 74 years of age. ? You are at high risk for heart disease.  Cancer screening Lung Cancer  Lung cancer screening is recommended for adults 68-33 years old who are at high risk for lung cancer because of a history of smoking.  A yearly low-dose CT scan of the lungs is recommended for people who: ? Currently smoke. ? Have quit within the  past 15 years. ? Have at least a 30-pack-year history of smoking. A pack year is smoking an average of one pack of cigarettes a day for 1 year.  Yearly screening should continue until it has been 15 years since you quit.  Yearly screening should stop if you develop a health problem that would prevent you from having lung cancer treatment.  Breast Cancer  Practice breast self-awareness. This means understanding how your breasts normally appear and feel.  It also means doing regular breast self-exams. Let your health care provider know about any changes, no matter how small.  If you are in your 20s or 30s, you should have a clinical breast exam (CBE) by a health care provider every 1-3 years as part of a regular health exam.  If you are 52 or older, have a CBE every year. Also consider having a breast X-ray (mammogram) every year.  If you have a family history of breast cancer, talk to your health care provider about genetic screening.  If you are at high risk for breast cancer, talk to your health care provider about having an MRI and a mammogram every year.  Breast cancer gene (BRCA) assessment is recommended for women who have family members with BRCA-related cancers. BRCA-related cancers include: ? Breast. ? Ovarian. ? Tubal. ?  Peritoneal cancers.  Results of the assessment will determine the need for genetic counseling and BRCA1 and BRCA2 testing.  Cervical Cancer Your health care provider may recommend that you be screened regularly for cancer of the pelvic organs (ovaries, uterus, and vagina). This screening involves a pelvic examination, including checking for microscopic changes to the surface of your cervix (Pap test). You may be encouraged to have this screening done every 3 years, beginning at age 28.  For women ages 14-65, health care providers may recommend pelvic exams and Pap testing every 3 years, or they may recommend the Pap and pelvic exam, combined with testing for  human papilloma virus (HPV), every 5 years. Some types of HPV increase your risk of cervical cancer. Testing for HPV may also be done on women of any age with unclear Pap test results.  Other health care providers may not recommend any screening for nonpregnant women who are considered low risk for pelvic cancer and who do not have symptoms. Ask your health care provider if a screening pelvic exam is right for you.  If you have had past treatment for cervical cancer or a condition that could lead to cancer, you need Pap tests and screening for cancer for at least 20 years after your treatment. If Pap tests have been discontinued, your risk factors (such as having a new sexual partner) need to be reassessed to determine if screening should resume. Some women have medical problems that increase the chance of getting cervical cancer. In these cases, your health care provider may recommend more frequent screening and Pap tests.  Colorectal Cancer  This type of cancer can be detected and often prevented.  Routine colorectal cancer screening usually begins at 74 years of age and continues through 74 years of age.  Your health care provider may recommend screening at an earlier age if you have risk factors for colon cancer.  Your health care provider may also recommend using home test kits to check for hidden blood in the stool.  A small camera at the end of a tube can be used to examine your colon directly (sigmoidoscopy or colonoscopy). This is done to check for the earliest forms of colorectal cancer.  Routine screening usually begins at age 14.  Direct examination of the colon should be repeated every 5-10 years through 74 years of age. However, you may need to be screened more often if early forms of precancerous polyps or small growths are found.  Skin Cancer  Check your skin from head to toe regularly.  Tell your health care provider about any new moles or changes in moles, especially if  there is a change in a mole's shape or color.  Also tell your health care provider if you have a mole that is larger than the size of a pencil eraser.  Always use sunscreen. Apply sunscreen liberally and repeatedly throughout the day.  Protect yourself by wearing long sleeves, pants, a wide-brimmed hat, and sunglasses whenever you are outside.  Heart disease, diabetes, and high blood pressure  High blood pressure causes heart disease and increases the risk of stroke. High blood pressure is more likely to develop in: ? People who have blood pressure in the high end of the normal range (130-139/85-89 mm Hg). ? People who are overweight or obese. ? People who are African American.  If you are 86-43 years of age, have your blood pressure checked every 3-5 years. If you are 81 years of age or older, have  your blood pressure checked every year. You should have your blood pressure measured twice-once when you are at a hospital or clinic, and once when you are not at a hospital or clinic. Record the average of the two measurements. To check your blood pressure when you are not at a hospital or clinic, you can use: ? An automated blood pressure machine at a pharmacy. ? A home blood pressure monitor.  If you are between 13 years and 52 years old, ask your health care provider if you should take aspirin to prevent strokes.  Have regular diabetes screenings. This involves taking a blood sample to check your fasting blood sugar level. ? If you are at a normal weight and have a low risk for diabetes, have this test once every three years after 74 years of age. ? If you are overweight and have a high risk for diabetes, consider being tested at a younger age or more often. Preventing infection Hepatitis B  If you have a higher risk for hepatitis B, you should be screened for this virus. You are considered at high risk for hepatitis B if: ? You were born in a country where hepatitis B is common. Ask your  health care provider which countries are considered high risk. ? Your parents were born in a high-risk country, and you have not been immunized against hepatitis B (hepatitis B vaccine). ? You have HIV or AIDS. ? You use needles to inject street drugs. ? You live with someone who has hepatitis B. ? You have had sex with someone who has hepatitis B. ? You get hemodialysis treatment. ? You take certain medicines for conditions, including cancer, organ transplantation, and autoimmune conditions.  Hepatitis C  Blood testing is recommended for: ? Everyone born from [birth year] through 1967. ? Anyone with known risk factors for hepatitis C.  Sexually transmitted infections (STIs)  You should be screened for sexually transmitted infections (STIs) including gonorrhea and chlamydia if: ? You are sexually active and are younger than 74 years of age. ? You are older than 74 years of age and your health care provider tells you that you are at risk for this type of infection. ? Your sexual activity has changed since you were last screened and you are at an increased risk for chlamydia or gonorrhea. Ask your health care provider if you are at risk.  If you do not have HIV, but are at risk, it may be recommended that you take a prescription medicine daily to prevent HIV infection. This is called pre-exposure prophylaxis (PrEP). You are considered at risk if: ? You are sexually active and do not regularly use condoms or know the HIV status of your partner(s). ? You take drugs by injection. ? You are sexually active with a partner who has HIV.  Talk with your health care provider about whether you are at high risk of being infected with HIV. If you choose to begin PrEP, you should first be tested for HIV. You should then be tested every 3 months for as long as you are taking PrEP. Pregnancy  If you are premenopausal and you may become pregnant, ask your health care provider about preconception  counseling.  If you may become pregnant, take 400 to 800 micrograms (mcg) of folic acid every day.  If you want to prevent pregnancy, talk to your health care provider about birth control (contraception). Osteoporosis and menopause  Osteoporosis is a disease in which the bones lose minerals and strength  with aging. This can result in serious bone fractures. Your risk for osteoporosis can be identified using a bone density scan.  If you are 75 years of age or older, or if you are at risk for osteoporosis and fractures, ask your health care provider if you should be screened.  Ask your health care provider whether you should take a calcium or vitamin D supplement to lower your risk for osteoporosis.  Menopause may have certain physical symptoms and risks.  Hormone replacement therapy may reduce some of these symptoms and risks. Talk to your health care provider about whether hormone replacement therapy is right for you. Follow these instructions at home:  Schedule regular health, dental, and eye exams.  Stay current with your immunizations.  Do not use any tobacco products including cigarettes, chewing tobacco, or electronic cigarettes.  If you are pregnant, do not drink alcohol.  If you are breastfeeding, limit how much and how often you drink alcohol.  Limit alcohol intake to no more than 1 drink per day for nonpregnant women. One drink equals 12 ounces of beer, 5 ounces of wine, or 1 ounces of hard liquor.  Do not use street drugs.  Do not share needles.  Ask your health care provider for help if you need support or information about quitting drugs.  Tell your health care provider if you often feel depressed.  Tell your health care provider if you have ever been abused or do not feel safe at home. This information is not intended to replace advice given to you by your health care provider. Make sure you discuss any questions you have with your health care provider. Document  Released: 11/25/2010 Document Revised: 10/18/2015 Document Reviewed: 02/13/2015 Elsevier Interactive Patient Education  [employer].

## 2017-08-07 ENCOUNTER — Telehealth: Payer: Self-pay

## 2017-08-07 NOTE — Telephone Encounter (Signed)
Call to Ms Rebecca Novak to let her know her colonoscopy is due around June of this year per her GI specialist. She was not there and will try her next week Wynetta Fines RN

## 2017-08-10 ENCOUNTER — Other Ambulatory Visit: Payer: Self-pay | Admitting: Internal Medicine

## 2017-08-12 ENCOUNTER — Other Ambulatory Visit: Payer: Self-pay

## 2017-08-12 ENCOUNTER — Other Ambulatory Visit (INDEPENDENT_AMBULATORY_CARE_PROVIDER_SITE_OTHER): Payer: Medicare Other

## 2017-08-12 DIAGNOSIS — Z86010 Personal history of colonic polyps: Secondary | ICD-10-CM

## 2017-08-12 LAB — POC HEMOCCULT BLD/STL (HOME/3-CARD/SCREEN)
FECAL OCCULT BLD: NEGATIVE
FECAL OCCULT BLD: NEGATIVE
FECAL OCCULT BLD: NEGATIVE

## 2017-08-13 DIAGNOSIS — C44519 Basal cell carcinoma of skin of other part of trunk: Secondary | ICD-10-CM | POA: Diagnosis not present

## 2017-08-13 NOTE — Telephone Encounter (Signed)
Call back to Ms Obarr. Mr smartt answered and took a message that she will receive a call regarding her colonoscopy in June . Wynetta Fines RN

## 2017-08-18 DIAGNOSIS — R000 Tachycardia, unspecified: Secondary | ICD-10-CM | POA: Diagnosis not present

## 2017-09-02 ENCOUNTER — Encounter: Payer: Self-pay | Admitting: Internal Medicine

## 2017-09-02 ENCOUNTER — Ambulatory Visit (INDEPENDENT_AMBULATORY_CARE_PROVIDER_SITE_OTHER): Payer: Medicare Other | Admitting: Internal Medicine

## 2017-09-02 VITALS — BP 102/60 | Temp 98.4°F | Wt 201.0 lb

## 2017-09-02 DIAGNOSIS — E039 Hypothyroidism, unspecified: Secondary | ICD-10-CM | POA: Diagnosis not present

## 2017-09-02 DIAGNOSIS — D500 Iron deficiency anemia secondary to blood loss (chronic): Secondary | ICD-10-CM

## 2017-09-02 DIAGNOSIS — R002 Palpitations: Secondary | ICD-10-CM

## 2017-09-02 LAB — CBC WITH DIFFERENTIAL/PLATELET
BASOS ABS: 0 10*3/uL (ref 0.0–0.1)
Basophils Relative: 1 % (ref 0.0–3.0)
EOS ABS: 0.2 10*3/uL (ref 0.0–0.7)
Eosinophils Relative: 4.8 % (ref 0.0–5.0)
HEMATOCRIT: 32.8 % — AB (ref 36.0–46.0)
Hemoglobin: 10.7 g/dL — ABNORMAL LOW (ref 12.0–15.0)
LYMPHS PCT: 41.8 % (ref 12.0–46.0)
Lymphs Abs: 1.6 10*3/uL (ref 0.7–4.0)
MCHC: 32.6 g/dL (ref 30.0–36.0)
MCV: 76 fl — ABNORMAL LOW (ref 78.0–100.0)
Monocytes Absolute: 0.4 10*3/uL (ref 0.1–1.0)
Monocytes Relative: 10.7 % (ref 3.0–12.0)
NEUTROS ABS: 1.6 10*3/uL (ref 1.4–7.7)
Neutrophils Relative %: 41.7 % — ABNORMAL LOW (ref 43.0–77.0)
PLATELETS: 236 10*3/uL (ref 150.0–400.0)
RBC: 4.32 Mil/uL (ref 3.87–5.11)
RDW: 28.9 % — ABNORMAL HIGH (ref 11.5–15.5)
WBC: 3.8 10*3/uL — ABNORMAL LOW (ref 4.0–10.5)

## 2017-09-02 NOTE — Patient Instructions (Addendum)
Continue iron therapy  Return in 3 months for follow-up  Monitor your pulse rate as discussed

## 2017-09-02 NOTE — Progress Notes (Signed)
Subjective:    Patient ID: Rebecca Novak, female    DOB: February 23, 1946, 72 y.o.   MRN: [id number]  HPI  72 year old patient who is seen today for follow-up of iron deficiency anemia as well as palpitations. When seen 1 month ago she was taken iron infrequently.  CBC was improved.  She has returned slides to check for occult blood all of which were negative. No GI complaints.  She now is on iron 3 times daily with dark stool She continues to have palpitations.  She called EMS who apparently did a rhythm strip.  She was not seen in the ED and was suggested that perhaps she was having anxiety.  No tachycardia has been demonstrated.  She was instructed how to check her pulse last visit but she states that she was unable to find her pulse during episodes of palpitations.  Denies any exertional chest pain She requests cardiology referral today  Past Medical History:  Diagnosis Date  . ANEMIA, MILD 05/17/2008  . COLONIC POLYPS, HX OF 05/17/2007  . Depression    bipolar  . GLUCOSE INTOLERANCE 11/23/2007  . HAND PAIN 07/31/2008  . HYPERLIPIDEMIA 02/05/2007  . HYPOTHYROIDISM 02/05/2007  . Skin cancer    "scraped off chest and RLE" (02/15/2013)  . SYSTOLIC MURMUR 13/12/6576     Social History   Socioeconomic History  . Marital status: Single    Spouse name: Not on file  . Number of children: Not on file  . Years of education: Not on file  . Highest education level: Not on file  Occupational History  . Not on file  Social Needs  . Financial resource strain: Not on file  . Food insecurity:    Worry: Not on file    Inability: Not on file  . Transportation needs:    Medical: Not on file    Non-medical: Not on file  Tobacco Use  . Smoking status: Former Smoker    Packs/day: 0.33    Years: 44.00    Pack years: 14.52    Types: Cigarettes  . Smokeless tobacco: Never Used  . Tobacco comment: 02/15/2013 "stopped smoking cigarettes ~ 7 yr ago"  Substance and Sexual Activity  . Alcohol  use: No  . Drug use: No  . Sexual activity: Yes  Lifestyle  . Physical activity:    Days per week: Not on file    Minutes per session: Not on file  . Stress: Not on file  Relationships  . Social connections:    Talks on phone: Not on file    Gets together: Not on file    Attends religious service: Not on file    Active member of club or organization: Not on file    Attends meetings of clubs or organizations: Not on file    Relationship status: Not on file  . Intimate partner violence:    Fear of current or ex partner: Not on file    Emotionally abused: Not on file    Physically abused: Not on file    Forced sexual activity: Not on file  Other Topics Concern  . Not on file  Social History Narrative  . Not on file    Past Surgical History:  Procedure Laterality Date  . APPENDECTOMY    . TONSILLECTOMY    . VAGINAL HYSTERECTOMY      History reviewed. No pertinent family history.  No Known Allergies  Current Outpatient Medications on File Prior to Visit  Medication Sig Dispense Refill  .  Calcium Carbonate (CALCIUM 500 PO) Take 2 tablets by mouth daily.      . Carbamazepine (EQUETRO) 300 MG CP12 Take 4 capsules by mouth daily. One capsule [300 mg] at 11:30 AM, one capsule [300 mg] at 7:30 PM, and two capsules [600 mg] at 11:30 PM.    . LATUDA 120 MG TABS     . levothyroxine (SYNTHROID, LEVOTHROID) 100 MCG tablet Take 1 tablet (100 mcg total) by mouth daily. 90 tablet 4  . Misc Natural Products (LUTEIN 20 PO) Take by mouth.    . Omega-3 Fatty Acids (FISH OIL PO) Take by mouth 2 (two) times daily.    . QUEtiapine (SEROQUEL) 400 MG tablet Take 1,200 mg by mouth at bedtime. Prescribed by Dr. Curt Jews    . SF 5000 PLUS 1.1 % CREA dental cream Place 1 application onto teeth at bedtime.     . simvastatin (ZOCOR) 40 MG tablet TAKE 1 TABLET BY MOUTH AT BEDTIME 60 tablet 0  . zolpidem (AMBIEN) 10 MG tablet Take 5-10 mg by mouth at bedtime as needed (Prescribed by Dr. Clovis Pu).      No  current facility-administered medications on file prior to visit.     BP 102/60 (BP Location: Left Arm, Patient Position: Sitting, Cuff Size: Large)   Temp 98.4 F (36.9 C) (Oral)   Wt 201 lb (91.2 kg)   BMI 30.56 kg/m     Review of Systems  Constitutional: Negative.   HENT: Negative for congestion, dental problem, hearing loss, rhinorrhea, sinus pressure, sore throat and tinnitus.   Eyes: Negative for pain, discharge and visual disturbance.  Respiratory: Negative for cough and shortness of breath.   Cardiovascular: Positive for palpitations. Negative for chest pain and leg swelling.  Gastrointestinal: Negative for abdominal distention, abdominal pain, blood in stool, constipation, diarrhea, nausea and vomiting.  Genitourinary: Negative for difficulty urinating, dysuria, flank pain, frequency, hematuria, pelvic pain, urgency, vaginal bleeding, vaginal discharge and vaginal pain.  Musculoskeletal: Negative for arthralgias, gait problem and joint swelling.  Skin: Negative for rash.  Neurological: Negative for dizziness, syncope, speech difficulty, weakness, numbness and headaches.  Hematological: Negative for adenopathy.  Psychiatric/Behavioral: Negative for agitation, behavioral problems and dysphoric mood. The patient is nervous/anxious.        Objective:   Physical Exam  Constitutional: She is oriented to person, place, and time. She appears well-developed and well-nourished.  HENT:  Head: Normocephalic.  Right Ear: External ear normal.  Left Ear: External ear normal.  Mouth/Throat: Oropharynx is clear and moist.  Eyes: Pupils are equal, round, and reactive to light. Conjunctivae and EOM are normal.  Neck: Normal range of motion. Neck supple. No thyromegaly present.  Cardiovascular: Normal rate, regular rhythm, normal heart sounds and intact distal pulses.  Rhythm is regular with a pulse of approximately 70  Pulmonary/Chest: Effort normal and breath sounds normal.    Abdominal: Soft. Bowel sounds are normal. She exhibits no mass. There is no tenderness.  Musculoskeletal: Normal range of motion.  Lymphadenopathy:    She has no cervical adenopathy.  Neurological: She is alert and oriented to person, place, and time.  Skin: Skin is warm and dry. No rash noted.  Psychiatric: She has a normal mood and affect. Her behavior is normal.          Assessment & Plan:   History of iron deficiency anemia.  Will review a CBC History of palpitations.  Anemia has improved and she still remains symptomatic.  She has been under situational stress  due to the poor health of her significant other  Cardiology referral per patient request Check CBC  Follow-up 3 months or as needed Patient again instructed how to check her pulse during periods of palpitations  [id number],PETER Pilar Plate

## 2017-09-08 ENCOUNTER — Encounter: Payer: Self-pay | Admitting: Family Medicine

## 2017-09-10 ENCOUNTER — Telehealth: Payer: Self-pay | Admitting: Internal Medicine

## 2017-09-10 NOTE — Telephone Encounter (Signed)
Noted  

## 2017-09-10 NOTE — Telephone Encounter (Signed)
Pt returned call and message was given to her regarding needing a referral to Dr. Cristina Gong for follow up of iron deficiency anemia. Pt voiced understanding.

## 2017-09-30 ENCOUNTER — Encounter: Payer: Self-pay | Admitting: Cardiology

## 2017-09-30 ENCOUNTER — Ambulatory Visit (INDEPENDENT_AMBULATORY_CARE_PROVIDER_SITE_OTHER): Payer: Medicare Other | Admitting: Cardiology

## 2017-09-30 ENCOUNTER — Encounter

## 2017-09-30 VITALS — BP 122/70 | HR 66 | Ht 68.0 in | Wt 198.8 lb

## 2017-09-30 DIAGNOSIS — E785 Hyperlipidemia, unspecified: Secondary | ICD-10-CM

## 2017-09-30 DIAGNOSIS — E039 Hypothyroidism, unspecified: Secondary | ICD-10-CM

## 2017-09-30 DIAGNOSIS — D500 Iron deficiency anemia secondary to blood loss (chronic): Secondary | ICD-10-CM | POA: Diagnosis not present

## 2017-09-30 DIAGNOSIS — R002 Palpitations: Secondary | ICD-10-CM | POA: Diagnosis not present

## 2017-09-30 MED ORDER — METOPROLOL TARTRATE 25 MG PO TABS: 25.0000 mg | ORAL_TABLET | Freq: Every day | ORAL | 3 refills | Status: DC | PRN

## 2017-09-30 NOTE — Progress Notes (Signed)
Cardiology Office Note:    Date:  09/30/2017   ID:  ERVIN ROTHBAUER, DOB [birth year], MRN [id number]  PCP:  Marletta Lor, MD  Cardiologist:   Sherren Mocha, MD - new  Referring MD: Marletta Lor, MD   Chief Complaint  Patient presents with  . New Patient (Initial Visit)    denies chest pains, does complain of papitations had to call EMS, denies SOB, swelling in hands/feet    History of Present Illness:    SITA MANGEN is a 72 y.o. female who is being seen today for the evaluation of palpitations at the request of Marletta Lor, MD.   The patient has a past medical history significant for iron deficiency anemia, hypothyroidism, hyperlipidemia, depression. She was referred to cardiology for evaluation of complaints of palpitations. She was evaluated for syncope in 2017 with a normal 2-D echo and an event monitor showing no significant abnormalities.  On 3/26 her heart was racing and she called 911. Her BP was high and she was very anxious. She had a recurrence on 4/23 and she called 911 again. She was not felt to require transport to the hospital either time. She has been told that these episodes could be anxiety related. Her partner is unwell with cancer and may be contributing to her anxiety, although she did not feel increased stress at the time. Her palpitations were fast and regular and lasted for at least 10 minutes while she was seated, watching TV. She had no associated chest discomfort, dyspnea, sob, lightheadedness, or syncope/near syncope. She denies orthopnea, PND or edema.   She drinks 1 cup of coffee in the morning and a rare cola. She did not have increased caffeine intake surrounding the palpitations. She smoked since age 33 and  Quit in 2013. She does not drink alcohol. She denies use of any herbal supplements or over the counter meds. She does not exercise. She has always been active until this year. She cares for herself and her partner. She  drives and goes shopping. She denies exertional chest discomfort or dyspnea. She has no known family history of cardiac disease.    PAD Screen 09/30/2017  Previous PAD dx? No  Previous surgical procedure? No  Pain with walking? No  Feet/toe relief with dangling? No  Painful, non-healing ulcers? No  Extremities discolored? No    Past Medical History:  Diagnosis Date  . ANEMIA, MILD 05/17/2008  . COLONIC POLYPS, HX OF 05/17/2007  . Depression    bipolar  . GLUCOSE INTOLERANCE 11/23/2007  . HAND PAIN 07/31/2008  . HYPERLIPIDEMIA 02/05/2007  . HYPOTHYROIDISM 02/05/2007  . Skin cancer    "scraped off chest and RLE" (02/15/2013)  . SYSTOLIC MURMUR 05/28/7251    Past Surgical History:  Procedure Laterality Date  . APPENDECTOMY    . TONSILLECTOMY    . VAGINAL HYSTERECTOMY      Current Medications: Current Meds  Medication Sig  . Calcium Carbonate (CALCIUM 500 PO) Take 2 tablets by mouth daily.    . Carbamazepine (EQUETRO) 300 MG CP12 Take 4 capsules by mouth daily. One capsule [300 mg] at 11:30 AM, one capsule [300 mg] at 7:30 PM, and two capsules [600 mg] at 11:30 PM.  . LATUDA 120 MG TABS   . levothyroxine (SYNTHROID, LEVOTHROID) 100 MCG tablet Take 1 tablet (100 mcg total) by mouth daily.  . Omega-3 Fatty Acids (FISH OIL PO) Take by mouth 2 (two) times daily.  . QUEtiapine (SEROQUEL) 400 MG tablet Take  1,200 mg by mouth at bedtime. Prescribed by Dr. Curt Jews  . SF 5000 PLUS 1.1 % CREA dental cream Place 1 application onto teeth at bedtime.   . simvastatin (ZOCOR) 40 MG tablet TAKE 1 TABLET BY MOUTH AT BEDTIME  . zolpidem (AMBIEN) 10 MG tablet Take 5-10 mg by mouth at bedtime as needed (Prescribed by Dr. Clovis Pu).      Allergies:   Patient has no known allergies.   Social History   Socioeconomic History  . Marital status: Soil scientist    Spouse name: [name]  . Number of children: 0  . Years of education: Not on file  . Highest education level: Not on file    Occupational History  . 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 Financial resource strain: Not on file  . Food insecurity:    Worry: Not on file    Inability: Not on file  . Transportation needs:    Medical: Not on file    Non-medical: Not on file  Tobacco Use  . Smoking status: Former Smoker    Packs/day: 0.33    Years: 44.00    Pack years: 14.52    Types: Cigarettes  . Smokeless tobacco: Never Used  . Tobacco comment: 02/15/2013 "stopped smoking cigarettes ~ 7 yr ago"  Substance and Sexual Activity  . Alcohol use: No  . Drug use: No  . Sexual activity: Yes  Lifestyle  . Physical activity:    Days per week: Not on file    Minutes per session: Not on file  . Stress: Not on file  Relationships  . Social connections:    Talks on phone: Not on file    Gets together: Not on file    Attends religious service: Not on file    Active member of club or organization: Not on file    Attends meetings of clubs or organizations: Not on file    Relationship status: Not on file  Other Topics Concern  . Not on file  Social History Narrative  . Not on file     Family History: The patient's family history includes Dementia in her mother; Healthy in her father and sister; Mental illness in her brother. ROS:   Please see the history of present illness.     All other systems reviewed and are negative.  EKGs/Labs/Other Studies Reviewed:    The following studies were reviewed today:  Echocardiogram 10/15/2015 Study Conclusions  - Left ventricle: The cavity size was normal. Systolic function was   normal. The estimated ejection fraction was in the range of 60%   to 65%. Wall motion was normal; there were no regional wall   motion abnormalities. - Aortic valve: Moderate diffuse thickening and calcification,   consistent with sclerosis. Valve area (VTI): 2.4 cm^2. Valve area   (Vmax): 2.07 cm^2. Valve area (Vmean): 2.11 cm^2. - Mitral valve:  Moderate focal calcification of the anterior   leaflet (medial segment(s)). There was mild regurgitation.  Cardiac event monitor 10/15/2015  No atrial fibrillation  No adverse arrhythmias  Rare PVC  No pauses   Reassuring monitor Candee Furbish, MD  EKG:  EKG is  ordered today.  The ekg ordered today demonstrates sinus rhythm, 61 bpm, QTC 430, no acute abnormalities  Recent Labs: 06/26/2017: BUN 24; Creat 0.96; Potassium 5.0; Sodium 142; TSH 3.16 09/02/2017: Hemoglobin 10.7; Platelets 236.0   Recent Lipid Panel    Component Value Date/Time   CHOL  228 (H) 02/02/2015 1027   TRIG 92.0 02/02/2015 1027   TRIG 45 05/05/2006 0904   HDL 74.70 02/02/2015 1027   CHOLHDL 3 02/02/2015 1027   VLDL 18.4 02/02/2015 1027   LDLCALC 135 (H) 02/02/2015 1027   LDLDIRECT 122.7 12/18/2010 0931    Physical Exam:    VS:  BP 122/70 (BP Location: Right Arm)   Pulse 66   Ht 5\' 8"  (1.727 m)   Wt 198 lb 12 oz (90.2 kg)   BMI 30.22 kg/m     Wt Readings from Last 3 Encounters:  09/30/17 198 lb 12 oz (90.2 kg)  09/02/17 201 lb (91.2 kg)  08/05/17 199 lb (90.3 kg)     GEN:  Well nourished, well developed in no acute distress HEENT: Normal NECK: No JVD; No carotid bruits LYMPHATICS: No lymphadenopathy CARDIAC: RRR, Very faint 2/6 systolic murmur RESPIRATORY:  Clear to auscultation without rales, wheezing or rhonchi  ABDOMEN: Soft, non-tender, non-distended MUSCULOSKELETAL:  No edema; No deformity  SKIN: Warm and dry NEUROLOGIC:  Alert and oriented x 3 PSYCHIATRIC:  Normal affect   ASSESSMENT:    1. Palpitations   2. Iron deficiency anemia due to chronic blood loss   3. Dyslipidemia   4. Acquired hypothyroidism    PLAN:    This patient's case was discussed in depth with Dr. Burt Knack. The plan below was formulated per our discussion.  In order of problems listed above:  Palpitations: Normal LV function with mild MR by echo in 10/2015. Event monitor in 2017 without significant findings.  Has had 2 episodes of fast, regular heart beating that lasted for at least 10 minutes with no associated symptoms. Last episode was 09/15/17. No syncope. No anginal or heart failure type symptoms. I discussed repeat testing with the patient vs treating with as needed metoprolol without testing. She prefers to try as needed metoprolol for now. If her symptoms persist we may decide to check an event monitor and possibly an echo. We will follow up as needed. She knows to call us for repeated symptoms uncontrolled with BB. She would probably not tolerate a daily BB due to her baseline heart rate in the low 60's. I also discussed with her the [place] for the phone, but she does not have a smart phone.   Iron deficiency anemia: Improved with iron therapy. Hemoglobin 10.7 on 09/02/17. Not likely contributing to her symptoms. She has been recommended to arrange for colonoscopy and I reinforced that this would be a good idea.   Hyperlipidemia: simvastatin 40 mg daily, fish oil. Followed by her PCP.   Hypothyroidism: treated with Synthroid. Last TSH was within normal range, 3.16 in 06/2017. Currently not tachycardic and is not likely contributing to her palpitations.    Medication Adjustments/Labs and Tests Ordered: Current medicines are reviewed at length with the patient today.  Concerns regarding medicines are outlined above. Labs and tests ordered and medication changes are outlined in the patient instructions below:  Patient Instructions  Medication Instructions:  Your physician recommends that you continue on your current medications as directed. Please refer to the Current Medication list given to you today.  A prescription has been sent in for metoprolol tartrate 25 mg tablet: Take 1 tablet daily AS NEEDED for palpitations  Labwork: None ordered  Testing/Procedures: None ordered  Follow-Up: Your physician wants you to follow-up AS NEEDED   Any Other Special Instructions Will Be Listed Below  (If Applicable).     If you need a refill  on your cardiac medications before your next appointment, please call your pharmacy.      Signed, Daune Perch, NP  09/30/2017 5:47 PM    [facility]

## 2017-09-30 NOTE — Patient Instructions (Signed)
Medication Instructions:  Your physician recommends that you continue on your current medications as directed. Please refer to the Current Medication list given to you today.  A prescription has been sent in for metoprolol tartrate 25 mg tablet: Take 1 tablet daily AS NEEDED for palpitations  Labwork: None ordered  Testing/Procedures: None ordered  Follow-Up: Your physician wants you to follow-up AS NEEDED   Any Other Special Instructions Will Be Listed Below (If Applicable).     If you need a refill on your cardiac medications before your next appointment, please call your pharmacy.

## 2017-10-07 DIAGNOSIS — F3173 Bipolar disorder, in partial remission, most recent episode manic: Secondary | ICD-10-CM | POA: Diagnosis not present

## 2017-10-11 DIAGNOSIS — F23 Brief psychotic disorder: Secondary | ICD-10-CM | POA: Diagnosis not present

## 2017-10-11 DIAGNOSIS — F419 Anxiety disorder, unspecified: Secondary | ICD-10-CM | POA: Diagnosis not present

## 2017-10-19 ENCOUNTER — Other Ambulatory Visit: Payer: Self-pay | Admitting: Internal Medicine

## 2017-10-20 ENCOUNTER — Other Ambulatory Visit: Payer: Self-pay | Admitting: Internal Medicine

## 2017-10-25 ENCOUNTER — Other Ambulatory Visit: Payer: Self-pay | Admitting: Internal Medicine

## 2017-11-11 DIAGNOSIS — R002 Palpitations: Secondary | ICD-10-CM | POA: Diagnosis not present

## 2017-11-19 DIAGNOSIS — F3173 Bipolar disorder, in partial remission, most recent episode manic: Secondary | ICD-10-CM | POA: Diagnosis not present

## 2017-11-23 ENCOUNTER — Other Ambulatory Visit: Payer: Self-pay

## 2017-11-23 ENCOUNTER — Emergency Department (HOSPITAL_COMMUNITY)
Admission: EM | Admit: 2017-11-23 | Discharge: 2017-11-24 | Disposition: A | Discharge status: Home / Self Care | Payer: Medicare Other | Attending: Emergency Medicine | Admitting: Emergency Medicine

## 2017-11-23 DIAGNOSIS — Z87891 Personal history of nicotine dependence: Secondary | ICD-10-CM | POA: Diagnosis not present

## 2017-11-23 DIAGNOSIS — R002 Palpitations: Secondary | ICD-10-CM | POA: Diagnosis not present

## 2017-11-23 DIAGNOSIS — Z85828 Personal history of other malignant neoplasm of skin: Secondary | ICD-10-CM | POA: Insufficient documentation

## 2017-11-23 DIAGNOSIS — E876 Hypokalemia: Secondary | ICD-10-CM | POA: Insufficient documentation

## 2017-11-23 DIAGNOSIS — E039 Hypothyroidism, unspecified: Secondary | ICD-10-CM | POA: Insufficient documentation

## 2017-11-23 DIAGNOSIS — Z79899 Other long term (current) drug therapy: Secondary | ICD-10-CM | POA: Diagnosis not present

## 2017-11-23 DIAGNOSIS — R7989 Other specified abnormal findings of blood chemistry: Secondary | ICD-10-CM | POA: Diagnosis not present

## 2017-11-23 DIAGNOSIS — R000 Tachycardia, unspecified: Secondary | ICD-10-CM | POA: Diagnosis not present

## 2017-11-23 DIAGNOSIS — R42 Dizziness and giddiness: Secondary | ICD-10-CM | POA: Diagnosis not present

## 2017-11-23 NOTE — ED Triage Notes (Signed)
Patient c/o palpitations since 21:30 p.m. Denies CP or SOB.

## 2017-11-24 ENCOUNTER — Emergency Department (HOSPITAL_COMMUNITY): Payer: Medicare Other

## 2017-11-24 DIAGNOSIS — R7989 Other specified abnormal findings of blood chemistry: Secondary | ICD-10-CM | POA: Diagnosis not present

## 2017-11-24 DIAGNOSIS — R002 Palpitations: Secondary | ICD-10-CM | POA: Diagnosis not present

## 2017-11-24 LAB — BASIC METABOLIC PANEL
Anion gap: 12 (ref 5–15)
BUN: 16 mg/dL (ref 8–23)
CALCIUM: 8.4 mg/dL — AB (ref 8.9–10.3)
CHLORIDE: 110 mmol/L (ref 98–111)
CO2: 19 mmol/L — ABNORMAL LOW (ref 22–32)
CREATININE: 1.06 mg/dL — AB (ref 0.44–1.00)
GFR, EST AFRICAN AMERICAN: 58 mL/min — AB (ref >60)
GFR, EST NON AFRICAN AMERICAN: 50 mL/min — AB (ref >60)
Glucose, Bld: 132 mg/dL — ABNORMAL HIGH (ref 70–99)
Potassium: 3.3 mmol/L — ABNORMAL LOW (ref 3.5–5.1)
SODIUM: 141 mmol/L (ref 135–145)

## 2017-11-24 LAB — CBC WITH DIFFERENTIAL/PLATELET
Abs Immature Granulocytes: 0 10*3/uL (ref 0.0–0.1)
BASOS PCT: 0 %
Basophils Absolute: 0 10*3/uL (ref 0.0–0.1)
EOS PCT: 3 %
Eosinophils Absolute: 0.1 10*3/uL (ref 0.0–0.7)
HCT: 36.6 % (ref 36.0–46.0)
HEMOGLOBIN: 11.9 g/dL — AB (ref 12.0–15.0)
Immature Granulocytes: 0 %
LYMPHS PCT: 25 %
Lymphs Abs: 1.4 10*3/uL (ref 0.7–4.0)
MCH: 28.1 pg (ref 26.0–34.0)
MCHC: 32.5 g/dL (ref 30.0–36.0)
MCV: 86.5 fL (ref 78.0–100.0)
MONO ABS: 0.5 10*3/uL (ref 0.1–1.0)
Monocytes Relative: 8 %
NEUTROS ABS: 3.5 10*3/uL (ref 1.7–7.7)
Neutrophils Relative %: 64 %
PLATELETS: 214 10*3/uL (ref 150–400)
RBC: 4.23 MIL/uL (ref 3.87–5.11)
RDW: 15.7 % — ABNORMAL HIGH (ref 11.5–15.5)
WBC: 5.5 10*3/uL (ref 4.0–10.5)

## 2017-11-24 LAB — I-STAT TROPONIN, ED: TROPONIN I, POC: 0.01 ng/mL (ref 0.00–0.08)

## 2017-11-24 LAB — D-DIMER, QUANTITATIVE: D-Dimer, Quant: 1.44 ug/mL-FEU — ABNORMAL HIGH (ref 0.00–0.50)

## 2017-11-24 LAB — MAGNESIUM: MAGNESIUM: 1.9 mg/dL (ref 1.7–2.4)

## 2017-11-24 MED ORDER — POTASSIUM CHLORIDE CRYS ER 20 MEQ PO TBCR: 20.0000 meq | EXTENDED_RELEASE_TABLET | Freq: Every day | ORAL | 0 refills | Status: DC

## 2017-11-24 MED ORDER — POTASSIUM CHLORIDE CRYS ER 20 MEQ PO TBCR
40.0000 meq | EXTENDED_RELEASE_TABLET | Freq: Once | ORAL | Status: AC
  Administered 2017-11-24: 40 meq via ORAL
  Filled 2017-11-24: qty 2

## 2017-11-24 MED ORDER — IOPAMIDOL (ISOVUE-370) INJECTION 76%
100.0000 mL | Freq: Once | INTRAVENOUS | Status: AC
  Administered 2017-11-24: 100 mL via INTRAVENOUS

## 2017-11-24 MED ORDER — IOPAMIDOL (ISOVUE-370) INJECTION 76%
INTRAVENOUS | Status: AC
  Filled 2017-11-24: qty 100

## 2017-11-24 NOTE — ED Notes (Signed)
E-signature not available. Patient verbalizes understanding of medications and discharge instructions; has no further questions at this time.

## 2017-11-24 NOTE — ED Notes (Signed)
Patient transported to CT 

## 2017-11-24 NOTE — ED Provider Notes (Signed)
[facility] EMERGENCY DEPARTMENT Provider Note   CSN: [id number] Arrival date & time: 11/23/17  2313     History   Chief Complaint Chief Complaint  Patient presents with  . Palpitations    HPI Rebecca Novak is a 72 y.o. female.  HPI  This is a 72 year old female with a history of anemia, depression, hyperlipidemia who presents with palpitations.  Patient reports that she was feeling well this evening until 930 when she had onset of palpitations.  She felt like her heart was racing.  She denies any chest pain or shortness of breath during that time.  She states that it lasted for approximately 5 minutes and self resolved.  She is currently at her baseline.  She does state that she feels somewhat anxious.  She has history of depression and takes depression medications but no other medications or recent changes in medications.  She denies any recent increase caffeine use.  Denies any recent fevers or illnesses.  She denies any concerns for dehydration.  No history of arrhythmia.  Past Medical History:  Diagnosis Date  . ANEMIA, MILD 05/17/2008  . COLONIC POLYPS, HX OF 05/17/2007  . Depression    bipolar  . GLUCOSE INTOLERANCE 11/23/2007  . HAND PAIN 07/31/2008  . HYPERLIPIDEMIA 02/05/2007  . HYPOTHYROIDISM 02/05/2007  . Skin cancer    "scraped off chest and RLE" (02/15/2013)  . SYSTOLIC MURMUR 29/79/8921    Patient Active Problem List   Diagnosis Date Noted  . Syncope 10/26/2015  . Nausea & vomiting 02/15/2013  . Weakness of both legs 02/15/2013  . Orthostatic hypotension 02/15/2013  . HAND PAIN 07/31/2008  . Anemia 05/17/2008  . GLUCOSE INTOLERANCE 11/23/2007  . History of colonic polyps 05/17/2007  . Hypothyroidism 02/05/2007  . Dyslipidemia 02/05/2007    Past Surgical History:  Procedure Laterality Date  . APPENDECTOMY    . TONSILLECTOMY    . VAGINAL HYSTERECTOMY       OB History   None      Home Medications    Prior to Admission  medications   Medication Sig Start Date End Date Taking? Authorizing Provider  Lurasidone HCl (LATUDA) 120 MG TABS 120 mg.   Yes [provider]  Calcium Carbonate (CALCIUM 500 PO) Take 2 tablets by mouth daily.      [provider]  Carbamazepine (EQUETRO) 300 MG CP12 Take 4 capsules by mouth daily. One capsule [300 mg] at 11:30 AM, one capsule [300 mg] at 7:30 PM, and two capsules [600 mg] at 11:30 PM.    [provider]  levothyroxine (SYNTHROID, LEVOTHROID) 100 MCG tablet Take 1 tablet (100 mcg total) by mouth daily. 07/09/17   Marletta Lor, MD  metoprolol tartrate (LOPRESSOR) 25 MG tablet Take 1 tablet (25 mg total) by mouth daily as needed (palpitations). 09/30/17   Daune Perch, NP  Misc Natural Products (LUTEIN 20 PO) Take by mouth.    [provider]  Omega-3 Fatty Acids (FISH OIL PO) Take by mouth 2 (two) times daily.    [provider]  potassium chloride SA (K-DUR,KLOR-CON) 20 MEQ tablet Take 1 tablet (20 mEq total) by mouth daily. 11/24/17   Merryl Hacker, MD  QUEtiapine (SEROQUEL XR) 400 MG 24 hr tablet  08/23/17   [provider]  QUEtiapine (SEROQUEL) 200 MG tablet  10/07/17   [provider]  QUEtiapine (SEROQUEL) 400 MG tablet Take 1,200 mg by mouth at bedtime. Prescribed by Dr. Curt Jews  [provider]  SF 5000 PLUS 1.1 % CREA dental cream Place 1 application onto teeth at bedtime.  10/30/11   [provider]  simvastatin (ZOCOR) 40 MG tablet TAKE 1 TABLET BY MOUTH AT BEDTIME Patient not taking: Reported on 11/24/2017 10/20/17   Marletta Lor, MD  simvastatin (ZOCOR) 40 MG tablet TAKE 1 TABLET BY MOUTH AT BEDTIME 10/26/17   Marletta Lor, MD  zolpidem (AMBIEN) 10 MG tablet Take 5-10 mg by mouth at bedtime as needed.  02/08/13   [provider]    Family History Family History  Problem Relation Age of Onset  . Dementia Mother   . Healthy Father        until death at age  2  . Mental illness Brother   . Healthy Sister     Social History Social History   Tobacco Use  . Smoking status: Former Smoker    Packs/day: 0.33    Years: 44.00    Pack years: 14.52    Types: Cigarettes  . Smokeless tobacco: Never Used  . Tobacco comment: 02/15/2013 "stopped smoking cigarettes ~ 7 yr ago"  Substance Use Topics  . Alcohol use: No  . Drug use: No     Allergies   Patient has no known allergies.   Review of Systems Review of Systems  Constitutional: Negative for fever.  Respiratory: Negative for cough and shortness of breath.   Cardiovascular: Positive for palpitations. Negative for chest pain and leg swelling.  Gastrointestinal: Negative for abdominal pain, nausea and vomiting.  Genitourinary: Negative for dysuria.  Neurological: Negative for numbness.  Psychiatric/Behavioral: The patient is nervous/anxious.   All other systems reviewed and are negative.    Physical Exam Updated Vital Signs BP 115/65   Pulse 75   Temp 98.1 F (36.7 C) (Oral)   Resp 19   Ht 5\' 8"  (1.727 m)   Wt 81.6 kg (180 lb)   SpO2 96%   BMI 27.37 kg/m   Physical Exam  Constitutional: She is oriented to person, place, and time. She appears well-developed and well-nourished. No distress.  HENT:  Head: Normocephalic and atraumatic.  Lips appear dry  Eyes: Pupils are equal, round, and reactive to light.  Neck: Neck supple.  Cardiovascular: Normal rate, regular rhythm and normal heart sounds.  Pulmonary/Chest: Effort normal and breath sounds normal. No respiratory distress. She has no wheezes.  Abdominal: Soft. Bowel sounds are normal. There is no tenderness. There is no guarding.  Musculoskeletal: She exhibits no edema or tenderness.  Neurological: She is alert and oriented to person, place, and time.  Cranial nerves II through XII intact, 5 out of 5 strength in all 4 extremities  Skin: Skin is warm and dry.  Psychiatric: She has a normal mood and affect.  Nursing note  and vitals reviewed.    ED Treatments / Results  Labs (all labs ordered are listed, but only abnormal results are displayed) Labs Reviewed  CBC WITH DIFFERENTIAL/PLATELET - Abnormal; Notable for the following components:      Result Value   Hemoglobin 11.9 (*)    RDW 15.7 (*)    All other components within normal limits  BASIC METABOLIC PANEL - Abnormal; Notable for the following components:   Potassium 3.3 (*)    CO2 19 (*)    Glucose, Bld 132 (*)    Creatinine, Ser 1.06 (*)    Calcium 8.4 (*)    GFR calc non Af Amer 50 (*)  GFR calc Af Amer 58 (*)    All other components within normal limits  D-DIMER, QUANTITATIVE (NOT AT [facility]) - Abnormal; Notable for the following components:   D-Dimer, Quant 1.44 (*)    All other components within normal limits  MAGNESIUM  I-STAT TROPONIN, ED    EKG EKG Interpretation  Date/Time:  Monday November 23 2017 23:17:37 EDT Ventricular Rate:  106 PR Interval:    QRS Duration: 97 QT Interval:  342 QTC Calculation: 455 R Axis:   38 Text Interpretation:  Sinus tachycardia Minimal ST depression, diffuse leads Baseline wander in lead(s) V3 Confirmed by Thayer Jew [phone]) on 11/23/2017 11:20:38 PM   Radiology Ct Angio Chest Pe W And/or Wo Contrast  Result Date: 11/24/2017 CLINICAL DATA:  Palpitation is with elevated D-dimer. EXAM: CT ANGIOGRAPHY CHEST WITH CONTRAST TECHNIQUE: Multidetector CT imaging of the chest was performed using the standard protocol during bolus administration of intravenous contrast. Multiplanar CT image reconstructions and MIPs were obtained to evaluate the vascular anatomy. CONTRAST:  140mL ISOVUE-370 IOPAMIDOL (ISOVUE-370) INJECTION 76% COMPARISON:  Chest CT 10/15/2006 FINDINGS: Cardiovascular: --Pulmonary arteries: Contrast injection is sufficient to demonstrate satisfactory opacification of the pulmonary arteries to the segmental level. There is no pulmonary embolus. The main pulmonary artery is within normal limits  for size. --Aorta: Satisfactory opacification of the thoracic aorta. No aortic dissection or other acute aortic syndrome. Conventional 3 vessel aortic branching pattern. The aortic course and caliber are normal. There is no aortic atherosclerosis. --Heart: Normal size. No pericardial effusion. Mediastinum/Nodes: No mediastinal, hilar or axillary lymphadenopathy. The visualized thyroid and thoracic esophageal course are unremarkable. Lungs/Pleura: No pulmonary nodules or masses. No pleural effusion or pneumothorax. No focal airspace consolidation. No focal pleural abnormality. Upper Abdomen: Contrast bolus timing is not optimized for evaluation of the abdominal organs. Within this limitation, the visualized organs of the upper abdomen are normal. Musculoskeletal: No chest wall abnormality. No acute or significant osseous findings. Review of the MIP images confirms the above findings. IMPRESSION: No pulmonary embolus or acute aortic syndrome. Electronically Signed   By: Ulyses Jarred M.D.   On: 11/24/2017 03:09    Procedures Procedures (including critical care time)  Medications Ordered in ED Medications  iopamidol (ISOVUE-370) 76 % injection (has no administration in time range)  potassium chloride SA (K-DUR,KLOR-CON) CR tablet 40 mEq (40 mEq Oral Given 11/24/17 0115)  iopamidol (ISOVUE-370) 76 % injection 100 mL (100 mLs Intravenous Contrast Given 11/24/17 0236)     Initial Impression / Assessment and Plan / ED Course  I have reviewed the triage vital signs and the nursing notes.  Pertinent labs & imaging results that were available during my care of the patient were reviewed by me and considered in my medical decision making (see chart for details).     Patient presents with palpitations.  Palpitations were self-limited and lasted less than 5 minutes.  She is otherwise nontoxic-appearing vital signs are reassuring she is mildly tachycardic initially; however, she settled into a sinus rhythm.  It  appears to be normal sinus rhythm without signs of arrhythmia or ischemia on EKG.  Lab work obtained.  Mildly hypokalemic with a potassium of 3.3.  This was replaced.  Troponin is negative.  Doubt ACS.  Patient did not have any shortness of breath or chest pain.  Given her tachycardia, screening d-dimer was sent.  This was elevated.  Patient sent for CT angio of the chest which ruled out PE.  She has remained stable while in the  emergency department.  No ectopy or arrhythmia noted on the monitor.  Recommend close follow-up with cardiology and primary physician.  Will give a short course of supplemental potassium and recommend recheck at primary physician.  After history, exam, and medical workup I feel the patient has been appropriately medically screened and is safe for discharge home. Pertinent diagnoses were discussed with the patient. Patient was given return precautions.   Final Clinical Impressions(s) / ED Diagnoses   Final diagnoses:  Palpitations  Hypokalemia    ED Discharge Orders        Ordered    potassium chloride SA (K-DUR,KLOR-CON) 20 MEQ tablet  Daily     11/24/17 0528       Merryl Hacker, MD 11/24/17 0530

## 2017-11-24 NOTE — Discharge Instructions (Addendum)
You were seen today for palpitations.  Your work-up is largely reassuring.  You did have mildly low potassium.  Increase potassium rich foods.  You were given 5-day course of potassium.  Follow-up with your primary doctor for recheck.  Follow-up with cardiology for repeat evaluation and possible Holter monitoring.

## 2017-11-25 ENCOUNTER — Ambulatory Visit: Payer: Medicare Other | Admitting: Cardiovascular Disease

## 2017-12-02 ENCOUNTER — Encounter: Payer: Self-pay | Admitting: Internal Medicine

## 2017-12-02 ENCOUNTER — Ambulatory Visit (INDEPENDENT_AMBULATORY_CARE_PROVIDER_SITE_OTHER): Payer: Medicare Other | Admitting: Internal Medicine

## 2017-12-02 VITALS — BP 90/54 | HR 85 | Temp 98.0°F | Wt 183.4 lb

## 2017-12-02 DIAGNOSIS — E039 Hypothyroidism, unspecified: Secondary | ICD-10-CM

## 2017-12-02 DIAGNOSIS — R002 Palpitations: Secondary | ICD-10-CM | POA: Diagnosis not present

## 2017-12-02 MED ORDER — METOPROLOL TARTRATE 25 MG PO TABS: 25.0000 mg | ORAL_TABLET | Freq: Every day | ORAL | 3 refills | Status: DC | PRN

## 2017-12-02 NOTE — Patient Instructions (Signed)
Metoprolol 25 mg daily as directed  Keep your caffeine use low to moderate  Return in 6 months for follow-up  GOOD LUCK!!

## 2017-12-02 NOTE — Progress Notes (Signed)
Subjective:    Patient ID: ITA FRITZSCHE, female    DOB: 26-Dec-1945, 72 y.o.   MRN: [id number]  HPI  72 year old patient who is seen today in follow-up.  She was seen in the ED 9 days ago after transport by EMS due to palpitations.   Serial EKGs revealed sinus tachycardia only.  She has been seen by cardiology for palpitations which have been evaluated by 2D echocardiogram as well as event monitor. Patient does have a history of anxiety and has had additional stress due to the poor health of her partner.  Today she feels well.  No further tachycardia since the ED visit. She has been prescribed metoprolol to take as needed palpitations  ED records and EKGs reviewed  Past Medical History:  Diagnosis Date  . ANEMIA, MILD 05/17/2008  . COLONIC POLYPS, HX OF 05/17/2007  . Depression    bipolar  . GLUCOSE INTOLERANCE 11/23/2007  . HAND PAIN 07/31/2008  . HYPERLIPIDEMIA 02/05/2007  . HYPOTHYROIDISM 02/05/2007  . Skin cancer    "scraped off chest and RLE" (02/15/2013)  . SYSTOLIC MURMUR 44/31/5400     Social History   Socioeconomic History  . Marital status: Soil scientist    Spouse name: [name]  . Number of children: 0  . Years of education: Not on file  . Highest education level: Not on file  Occupational History  . 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 Financial resource strain: Not on file  . Food insecurity:    Worry: Not on file    Inability: Not on file  . Transportation needs:    Medical: Not on file    Non-medical: Not on file  Tobacco Use  . Smoking status: Former Smoker    Packs/day: 0.33    Years: 44.00    Pack years: 14.52    Types: Cigarettes  . Smokeless tobacco: Never Used  . Tobacco comment: 02/15/2013 "stopped smoking cigarettes ~ 7 yr ago"  Substance and Sexual Activity  . Alcohol use: No  . Drug use: No  . Sexual activity: Yes  Lifestyle  . Physical activity:    Days per week: Not on file   Minutes per session: Not on file  . Stress: Not on file  Relationships  . Social connections:    Talks on phone: Not on file    Gets together: Not on file    Attends religious service: Not on file    Active member of club or organization: Not on file    Attends meetings of clubs or organizations: Not on file    Relationship status: Not on file  . Intimate partner violence:    Fear of current or ex partner: Not on file    Emotionally abused: Not on file    Physically abused: Not on file    Forced sexual activity: Not on file  Other Topics Concern  . Not on file  Social History Narrative  . Not on file    Past Surgical History:  Procedure Laterality Date  . APPENDECTOMY    . TONSILLECTOMY    . VAGINAL HYSTERECTOMY      Family History  Problem Relation Age of Onset  . Dementia Mother   . Healthy Father        until death at age 28  . Mental illness Brother   . Healthy Sister     No Known Allergies  Current Outpatient Medications on File Prior  to Visit  Medication Sig Dispense Refill  . Calcium Carbonate (CALCIUM 500 PO) Take 2 tablets by mouth daily.      . Carbamazepine (EQUETRO) 300 MG CP12 Take 4 capsules by mouth daily. One capsule [300 mg] at 11:30 AM, one capsule [300 mg] at 7:30 PM, and two capsules [600 mg] at 11:30 PM.    . levothyroxine (SYNTHROID, LEVOTHROID) 100 MCG tablet Take 1 tablet (100 mcg total) by mouth daily. 90 tablet 4  . Lurasidone HCl (LATUDA) 120 MG TABS 120 mg.    . Misc Natural Products (LUTEIN 20 PO) Take by mouth.    . Omega-3 Fatty Acids (FISH OIL PO) Take by mouth 2 (two) times daily.    . potassium chloride SA (K-DUR,KLOR-CON) 20 MEQ tablet Take 1 tablet (20 mEq total) by mouth daily. 5 tablet 0  . QUEtiapine (SEROQUEL XR) 400 MG 24 hr tablet     . SF 5000 PLUS 1.1 % CREA dental cream Place 1 application onto teeth at bedtime.     . simvastatin (ZOCOR) 40 MG tablet TAKE 1 TABLET BY MOUTH AT BEDTIME 90 tablet 0  . zolpidem (AMBIEN) 10 MG  tablet Take 5-10 mg by mouth at bedtime as needed.      No current facility-administered medications on file prior to visit.     BP (!) 90/54 (BP Location: Right Arm, Patient Position: Sitting, Cuff Size: Large)   Pulse 85   Temp 98 F (36.7 C) (Oral)   Wt 183 lb 6.4 oz (83.2 kg)   SpO2 95%   BMI 27.89 kg/m     Review of Systems  Constitutional: Negative.   HENT: Negative for congestion, dental problem, hearing loss, rhinorrhea, sinus pressure, sore throat and tinnitus.   Eyes: Negative for pain, discharge and visual disturbance.  Respiratory: Negative for cough and shortness of breath.   Cardiovascular: Positive for palpitations. Negative for chest pain and leg swelling.  Gastrointestinal: Negative for abdominal distention, abdominal pain, blood in stool, constipation, diarrhea, nausea and vomiting.  Genitourinary: Negative for difficulty urinating, dysuria, flank pain, frequency, hematuria, pelvic pain, urgency, vaginal bleeding, vaginal discharge and vaginal pain.  Musculoskeletal: Negative for arthralgias, gait problem and joint swelling.  Skin: Negative for rash.  Neurological: Negative for dizziness, syncope, speech difficulty, weakness, numbness and headaches.  Hematological: Negative for adenopathy.  Psychiatric/Behavioral: Negative for agitation, behavioral problems and dysphoric mood. The patient is nervous/anxious.        Objective:   Physical Exam  Constitutional: She is oriented to person, place, and time. She appears well-developed and well-nourished.  Blood pressure 100/70  HENT:  Head: Normocephalic.  Right Ear: External ear normal.  Left Ear: External ear normal.  Mouth/Throat: Oropharynx is clear and moist.  Eyes: Pupils are equal, round, and reactive to light. Conjunctivae and EOM are normal.  Neck: Normal range of motion. Neck supple. No thyromegaly present.  Cardiovascular: Normal rate, regular rhythm, normal heart sounds and intact distal pulses.    Regular rhythm  Pulmonary/Chest: Effort normal and breath sounds normal.  Abdominal: Soft. Bowel sounds are normal. She exhibits no mass. There is no tenderness.  Musculoskeletal: Normal range of motion.  Lymphadenopathy:    She has no cervical adenopathy.  Neurological: She is alert and oriented to person, place, and time.  Skin: Skin is warm and dry. No rash noted.  Psychiatric: She has a normal mood and affect. Her behavior is normal.          Assessment & Plan:  History of palpitations.  These appear to be secondary to sinus tachycardia.  No change in medical regimen that includes as needed metoprolol Hypothyroidism continue levothyroxine supplement  Follow-up 4 to 6 months  Marletta Lor

## 2017-12-11 ENCOUNTER — Encounter (HOSPITAL_COMMUNITY): Payer: Self-pay | Admitting: Nurse Practitioner

## 2017-12-11 ENCOUNTER — Emergency Department (HOSPITAL_COMMUNITY): Payer: Medicare Other

## 2017-12-11 ENCOUNTER — Emergency Department (HOSPITAL_COMMUNITY)
Admission: EM | Admit: 2017-12-11 | Discharge: 2017-12-12 | Disposition: A | Discharge status: Home / Self Care | Payer: Medicare Other | Attending: Emergency Medicine | Admitting: Emergency Medicine

## 2017-12-11 DIAGNOSIS — R002 Palpitations: Secondary | ICD-10-CM | POA: Insufficient documentation

## 2017-12-11 DIAGNOSIS — R42 Dizziness and giddiness: Secondary | ICD-10-CM | POA: Diagnosis not present

## 2017-12-11 DIAGNOSIS — Z87891 Personal history of nicotine dependence: Secondary | ICD-10-CM | POA: Diagnosis not present

## 2017-12-11 DIAGNOSIS — R918 Other nonspecific abnormal finding of lung field: Secondary | ICD-10-CM | POA: Diagnosis not present

## 2017-12-11 DIAGNOSIS — Z79899 Other long term (current) drug therapy: Secondary | ICD-10-CM | POA: Insufficient documentation

## 2017-12-11 DIAGNOSIS — Z85828 Personal history of other malignant neoplasm of skin: Secondary | ICD-10-CM | POA: Diagnosis not present

## 2017-12-11 DIAGNOSIS — R404 Transient alteration of awareness: Secondary | ICD-10-CM | POA: Diagnosis not present

## 2017-12-11 DIAGNOSIS — F419 Anxiety disorder, unspecified: Secondary | ICD-10-CM | POA: Diagnosis not present

## 2017-12-11 DIAGNOSIS — E039 Hypothyroidism, unspecified: Secondary | ICD-10-CM | POA: Diagnosis not present

## 2017-12-11 NOTE — ED Triage Notes (Signed)
Pt states she is not feeling well, explains that she feels cold and nervous and "maybe has slight headache."

## 2017-12-11 NOTE — ED Notes (Signed)
Bed: HQ01 Expected date:  Expected time:  Means of arrival:  Comments: EMS 72 yo female "doesn't feel well"

## 2017-12-12 ENCOUNTER — Encounter (HOSPITAL_COMMUNITY): Payer: Self-pay | Admitting: Emergency Medicine

## 2017-12-12 DIAGNOSIS — R918 Other nonspecific abnormal finding of lung field: Secondary | ICD-10-CM | POA: Diagnosis not present

## 2017-12-12 DIAGNOSIS — R002 Palpitations: Secondary | ICD-10-CM | POA: Diagnosis not present

## 2017-12-12 LAB — CBC WITH DIFFERENTIAL/PLATELET
Basophils Absolute: 0 10*3/uL (ref 0.0–0.1)
Basophils Relative: 0 %
EOS PCT: 1 %
Eosinophils Absolute: 0.1 10*3/uL (ref 0.0–0.7)
HCT: 38.1 % (ref 36.0–46.0)
Hemoglobin: 12.6 g/dL (ref 12.0–15.0)
LYMPHS ABS: 1.1 10*3/uL (ref 0.7–4.0)
LYMPHS PCT: 18 %
MCH: 28.7 pg (ref 26.0–34.0)
MCHC: 33.1 g/dL (ref 30.0–36.0)
MCV: 86.8 fL (ref 78.0–100.0)
MONO ABS: 0.5 10*3/uL (ref 0.1–1.0)
MONOS PCT: 8 %
Neutro Abs: 4.6 10*3/uL (ref 1.7–7.7)
Neutrophils Relative %: 73 %
PLATELETS: 229 10*3/uL (ref 150–400)
RBC: 4.39 MIL/uL (ref 3.87–5.11)
RDW: 14.5 % (ref 11.5–15.5)
WBC: 6.3 10*3/uL (ref 4.0–10.5)

## 2017-12-12 LAB — I-STAT CHEM 8, ED
BUN: 18 mg/dL (ref 8–23)
Calcium, Ion: 1.14 mmol/L — ABNORMAL LOW (ref 1.15–1.40)
Chloride: 110 mmol/L (ref 98–111)
Creatinine, Ser: 1 mg/dL (ref 0.44–1.00)
GLUCOSE: 113 mg/dL — AB (ref 70–99)
HCT: 35 % — ABNORMAL LOW (ref 36.0–46.0)
HEMOGLOBIN: 11.9 g/dL — AB (ref 12.0–15.0)
Potassium: 3.5 mmol/L (ref 3.5–5.1)
Sodium: 142 mmol/L (ref 135–145)
TCO2: 23 mmol/L (ref 22–32)

## 2017-12-12 LAB — URINALYSIS, ROUTINE W REFLEX MICROSCOPIC
BILIRUBIN URINE: NEGATIVE
GLUCOSE, UA: NEGATIVE mg/dL
Hgb urine dipstick: NEGATIVE
KETONES UR: NEGATIVE mg/dL
LEUKOCYTES UA: NEGATIVE
Nitrite: NEGATIVE
Protein, ur: NEGATIVE mg/dL
SPECIFIC GRAVITY, URINE: 1.01 (ref 1.005–1.030)
pH: 6 (ref 5.0–8.0)

## 2017-12-12 LAB — I-STAT TROPONIN, ED: TROPONIN I, POC: 0 ng/mL (ref 0.00–0.08)

## 2017-12-12 NOTE — ED Provider Notes (Signed)
[facility] DEPT Provider Note   CSN: [id number] Arrival date & time: 12/11/17  2312     History   Chief Complaint Chief Complaint  Patient presents with  . Anxiety  . Not Feeling Well    HPI Rebecca Novak is a 72 y.o. female.  The history is provided by the patient.  Anxiety  This is a recurrent problem. The current episode started 1 to 2 hours ago. The problem occurs constantly. The problem has not changed since onset.Pertinent negatives include no chest pain, no abdominal pain, no headaches and no shortness of breath. Nothing aggravates the symptoms. Nothing relieves the symptoms. She has tried nothing for the symptoms. The treatment provided no relief.  Palpitations   This is a recurrent problem. The current episode started less than 1 hour ago. The problem occurs rarely. The problem has been resolved. The problem is associated with an unknown factor. Pertinent negatives include no diaphoresis, no fever, no malaise/fatigue, no numbness, no chest pain, no chest pressure, no claudication, no exertional chest pressure, no irregular heartbeat, no near-syncope, no orthopnea, no PND, no syncope, no abdominal pain, no nausea, no vomiting, no headaches, no back pain, no leg pain, no lower extremity edema, no weakness, no cough, no hemoptysis, no shortness of breath and no sputum production. She has tried nothing for the symptoms. The treatment provided significant relief. Risk factors include post menopause. Her past medical history does not include valve disorder.  No CP no SOB, started at rest.  Happened last week and ruled out for PE.  Has seen cardiology in past and had a holter monitor for same without a diagnosis.    Past Medical History:  Diagnosis Date  . ANEMIA, MILD 05/17/2008  . COLONIC POLYPS, HX OF 05/17/2007  . Depression    bipolar  . GLUCOSE INTOLERANCE 11/23/2007  . HAND PAIN 07/31/2008  . HYPERLIPIDEMIA 02/05/2007  . HYPOTHYROIDISM  02/05/2007  . Skin cancer    "scraped off chest and RLE" (02/15/2013)  . SYSTOLIC MURMUR 53/29/9242    Patient Active Problem List   Diagnosis Date Noted  . Palpitations 12/02/2017  . Syncope 10/26/2015  . Nausea & vomiting 02/15/2013  . Weakness of both legs 02/15/2013  . Orthostatic hypotension 02/15/2013  . HAND PAIN 07/31/2008  . Anemia 05/17/2008  . GLUCOSE INTOLERANCE 11/23/2007  . History of colonic polyps 05/17/2007  . Hypothyroidism 02/05/2007  . Dyslipidemia 02/05/2007    Past Surgical History:  Procedure Laterality Date  . APPENDECTOMY    . TONSILLECTOMY    . VAGINAL HYSTERECTOMY       OB History   None      Home Medications    Prior to Admission medications   Medication Sig Start Date End Date Taking? Authorizing Provider  Calcium Carbonate (CALCIUM 500 PO) Take 2 tablets by mouth daily.      [provider]  Carbamazepine (EQUETRO) 300 MG CP12 Take 4 capsules by mouth daily. One capsule [300 mg] at 11:30 AM, one capsule [300 mg] at 7:30 PM, and two capsules [600 mg] at 11:30 PM.    [provider]  levothyroxine (SYNTHROID, LEVOTHROID) 100 MCG tablet Take 1 tablet (100 mcg total) by mouth daily. 07/09/17   Marletta Lor, MD  Lurasidone HCl (LATUDA) 120 MG TABS 120 mg.    [provider]  metoprolol tartrate (LOPRESSOR) 25 MG tablet Take 1 tablet (25 mg total) by mouth daily as needed (palpitations). 12/02/17   Marletta Lor,  MD  Misc Natural Products (LUTEIN 20 PO) Take by mouth.    [provider]  Omega-3 Fatty Acids (FISH OIL PO) Take by mouth 2 (two) times daily.    [provider]  potassium chloride SA (K-DUR,KLOR-CON) 20 MEQ tablet Take 1 tablet (20 mEq total) by mouth daily. 11/24/17   Horton, Barbette Hair, MD  QUEtiapine (SEROQUEL XR) 400 MG 24 hr tablet  08/23/17   [provider]  SF 5000 PLUS 1.1 % CREA dental cream Place 1 application onto teeth at bedtime.  10/30/11   [provider]  simvastatin (ZOCOR) 40 MG tablet TAKE 1 TABLET BY MOUTH AT BEDTIME 10/20/17   Marletta Lor, MD  zolpidem (AMBIEN) 10 MG tablet Take 5-10 mg by mouth at bedtime as needed.  02/08/13   [provider]    Family History Family History  Problem Relation Age of Onset  . Dementia Mother   . Healthy Father        until death at age 49  . Mental illness Brother   . Healthy Sister     Social History Social History   Tobacco Use  . Smoking status: Former Smoker    Packs/day: 0.33    Years: 44.00    Pack years: 14.52    Types: Cigarettes  . Smokeless tobacco: Never Used  . Tobacco comment: 02/15/2013 "stopped smoking cigarettes ~ 7 yr ago"  Substance Use Topics  . Alcohol use: No  . Drug use: No     Allergies   Patient has no known allergies.   Review of Systems Review of Systems  Constitutional: Negative for diaphoresis, fever and malaise/fatigue.  Eyes: Negative for visual disturbance.  Respiratory: Negative for cough, hemoptysis, sputum production, shortness of breath and wheezing.   Cardiovascular: Positive for palpitations. Negative for chest pain, orthopnea, claudication, syncope, PND and near-syncope.  Gastrointestinal: Negative for abdominal pain, nausea and vomiting.  Genitourinary: Negative for flank pain.  Musculoskeletal: Negative for back pain.  Neurological: Negative for speech difficulty, weakness, numbness and headaches.  All other systems reviewed and are negative.    Physical Exam Updated Vital Signs BP 108/82 (BP Location: Left Arm)   Pulse 76   Temp 98.2 F (36.8 C) (Oral)   Resp 18   Ht 5\' 8"  (1.727 m)   Wt 83 kg (183 lb)   SpO2 96%   BMI 27.83 kg/m   Physical Exam  Constitutional: She is oriented to person, place, and time. She appears well-developed and well-nourished. No distress.  HENT:  Head: Normocephalic and atraumatic.  Mouth/Throat: No oropharyngeal exudate.  Eyes: Pupils are equal, round, and  reactive to light. Conjunctivae are normal.  Neck: Normal range of motion. Neck supple.  Cardiovascular: Normal rate, regular rhythm, normal heart sounds and intact distal pulses.  Pulmonary/Chest: Effort normal and breath sounds normal. No stridor. She has no wheezes. She has no rales.  Abdominal: Soft. Bowel sounds are normal. She exhibits no mass. There is no tenderness. There is no rebound and no guarding.  Musculoskeletal: Normal range of motion. She exhibits no tenderness.  Neurological: She is alert and oriented to person, place, and time. She displays normal reflexes.  Skin: Skin is warm and dry. Capillary refill takes less than 2 seconds.  Psychiatric: Her mood appears anxious.     ED Treatments / Results  Labs (all labs ordered are listed, but only abnormal results are displayed) Results for orders placed or performed during the hospital encounter of 12/11/17  CBC with Differential/Platelet  Result Value Ref Range   WBC 6.3 4.0 - 10.5 K/uL   RBC 4.39 3.87 - 5.11 MIL/uL   Hemoglobin 12.6 12.0 - 15.0 g/dL   HCT 38.1 36.0 - 46.0 %   MCV 86.8 78.0 - 100.0 fL   MCH 28.7 26.0 - 34.0 pg   MCHC 33.1 30.0 - 36.0 g/dL   RDW 14.5 11.5 - 15.5 %   Platelets 229 150 - 400 K/uL   Neutrophils Relative % 73 %   Neutro Abs 4.6 1.7 - 7.7 K/uL   Lymphocytes Relative 18 %   Lymphs Abs 1.1 0.7 - 4.0 K/uL   Monocytes Relative 8 %   Monocytes Absolute 0.5 0.1 - 1.0 K/uL   Eosinophils Relative 1 %   Eosinophils Absolute 0.1 0.0 - 0.7 K/uL   Basophils Relative 0 %   Basophils Absolute 0.0 0.0 - 0.1 K/uL  Urinalysis, Routine w reflex microscopic  Result Value Ref Range   Color, Urine YELLOW YELLOW   APPearance CLEAR CLEAR   Specific Gravity, Urine 1.010 1.005 - 1.030   pH 6.0 5.0 - 8.0   Glucose, UA NEGATIVE NEGATIVE mg/dL   Hgb urine dipstick NEGATIVE NEGATIVE   Bilirubin Urine NEGATIVE NEGATIVE   Ketones, ur NEGATIVE NEGATIVE mg/dL   Protein, ur NEGATIVE NEGATIVE mg/dL   Nitrite  NEGATIVE NEGATIVE   Leukocytes, UA NEGATIVE NEGATIVE  I-Stat Chem 8, ED  Result Value Ref Range   Sodium 142 135 - 145 mmol/L   Potassium 3.5 3.5 - 5.1 mmol/L   Chloride 110 98 - 111 mmol/L   BUN 18 8 - 23 mg/dL   Creatinine, Ser 1.00 0.44 - 1.00 mg/dL   Glucose, Bld 113 (H) 70 - 99 mg/dL   Calcium, Ion 1.14 (L) 1.15 - 1.40 mmol/L   TCO2 23 22 - 32 mmol/L   Hemoglobin 11.9 (L) 12.0 - 15.0 g/dL   HCT 35.0 (L) 36.0 - 46.0 %  I-stat troponin, ED  Result Value Ref Range   Troponin i, poc 0.00 0.00 - 0.08 ng/mL   Comment 3           Dg Chest 2 View  Result Date: 12/12/2017 CLINICAL DATA:  Malaise. Patient feels cold and nervous with slight headache. Ex-smoker. EXAM: CHEST - 2 VIEW COMPARISON:  11/24/2017 FINDINGS: Lungs are mildly hyperinflated without pulmonary consolidation or effusion. No pulmonary edema. Heart size is normal. Minimal aortic atherosclerosis is noted. No acute osseous abnormality. IMPRESSION: Mild hyperinflation of the lungs without active pulmonary disease. Minimal aortic atherosclerosis. Electronically Signed   By: Ashley Royalty M.D.   On: 12/12/2017 00:29   Ct Angio Chest Pe W And/or Wo Contrast  Result Date: 11/24/2017 CLINICAL DATA:  Palpitation is with elevated D-dimer. EXAM: CT ANGIOGRAPHY CHEST WITH CONTRAST TECHNIQUE: Multidetector CT imaging of the chest was performed using the standard protocol during bolus administration of intravenous contrast. Multiplanar CT image reconstructions and MIPs were obtained to evaluate the vascular anatomy. CONTRAST:  111mL ISOVUE-370 IOPAMIDOL (ISOVUE-370) INJECTION 76% COMPARISON:  Chest CT 10/15/2006 FINDINGS: Cardiovascular: --Pulmonary arteries: Contrast injection is sufficient to demonstrate satisfactory opacification of the pulmonary arteries to the segmental level. There is no pulmonary embolus. The main pulmonary artery is within normal limits for size. --Aorta: Satisfactory opacification of the thoracic aorta. No aortic  dissection or other acute aortic syndrome. Conventional 3 vessel aortic branching pattern. The aortic course and caliber are normal. There is no aortic atherosclerosis. --Heart: Normal size. No pericardial effusion.  Mediastinum/Nodes: No mediastinal, hilar or axillary lymphadenopathy. The visualized thyroid and thoracic esophageal course are unremarkable. Lungs/Pleura: No pulmonary nodules or masses. No pleural effusion or pneumothorax. No focal airspace consolidation. No focal pleural abnormality. Upper Abdomen: Contrast bolus timing is not optimized for evaluation of the abdominal organs. Within this limitation, the visualized organs of the upper abdomen are normal. Musculoskeletal: No chest wall abnormality. No acute or significant osseous findings. Review of the MIP images confirms the above findings. IMPRESSION: No pulmonary embolus or acute aortic syndrome. Electronically Signed   By: Ulyses Jarred M.D.   On: 11/24/2017 03:09    EKG EKG Interpretation  Date/Time:  Friday December 11 2017 23:47:24 EDT Ventricular Rate:  74 PR Interval:    QRS Duration: 102 QT Interval:  379 QTC Calculation: 421 R Axis:   51 Text Interpretation:  Sinus rhythm Confirmed by Dory Horn) on 12/12/2017 12:00:49 AM   Radiology Dg Chest 2 View  Result Date: 12/12/2017 CLINICAL DATA:  Malaise. Patient feels cold and nervous with slight headache. Ex-smoker. EXAM: CHEST - 2 VIEW COMPARISON:  11/24/2017 FINDINGS: Lungs are mildly hyperinflated without pulmonary consolidation or effusion. No pulmonary edema. Heart size is normal. Minimal aortic atherosclerosis is noted. No acute osseous abnormality. IMPRESSION: Mild hyperinflation of the lungs without active pulmonary disease. Minimal aortic atherosclerosis. Electronically Signed   By: Ashley Royalty M.D.   On: 12/12/2017 00:29    Procedures Procedures (including critical care time)   Final Clinical Impressions(s) / ED Diagnoses   I suspect this is anxiety and  the patient has been seen x 2 for same.  Ruled out for PE.  Will refer back to cardiology for holter monitor.     Return for pain, numbness, changes in vision or speech, fevers >100.4 unrelieved by medication, shortness of breath, intractable vomiting, or diarrhea, abdominal pain, Inability to tolerate liquids or food, cough, altered mental status or any concerns. No signs of systemic illness or infection. The patient is nontoxic-appearing on exam and vital signs are within normal limits. Will refer to urology for microscopy hematuria as patient is asymptomatic.  I have reviewed the triage vital signs and the nursing notes. Pertinent labs &imaging results that were available during my care of the patient were reviewed by me and considered in my medical decision making (see chart for details).  After history, exam, and medical workup I feel the patient has been appropriately medically screened and is safe for discharge home. Pertinent diagnoses were discussed with the patient. Patient was given return precautions.   Menna Abeln, MD 12/12/17 [id number]

## 2017-12-14 DIAGNOSIS — I213 ST elevation (STEMI) myocardial infarction of unspecified site: Secondary | ICD-10-CM | POA: Diagnosis not present

## 2017-12-14 DIAGNOSIS — R457 State of emotional shock and stress, unspecified: Secondary | ICD-10-CM | POA: Diagnosis not present

## 2017-12-14 DIAGNOSIS — I10 Essential (primary) hypertension: Secondary | ICD-10-CM | POA: Diagnosis not present

## 2017-12-30 ENCOUNTER — Ambulatory Visit: Payer: Medicare Other | Admitting: Internal Medicine

## 2018-01-05 ENCOUNTER — Ambulatory Visit (INDEPENDENT_AMBULATORY_CARE_PROVIDER_SITE_OTHER): Payer: Medicare Other | Admitting: Internal Medicine

## 2018-01-05 ENCOUNTER — Encounter: Payer: Self-pay | Admitting: Internal Medicine

## 2018-01-05 VITALS — BP 128/62 | HR 96 | Temp 98.2°F | Wt 196.4 lb

## 2018-01-05 DIAGNOSIS — R002 Palpitations: Secondary | ICD-10-CM | POA: Diagnosis not present

## 2018-01-05 DIAGNOSIS — E039 Hypothyroidism, unspecified: Secondary | ICD-10-CM

## 2018-01-05 DIAGNOSIS — R410 Disorientation, unspecified: Secondary | ICD-10-CM | POA: Diagnosis not present

## 2018-01-05 DIAGNOSIS — E739 Lactose intolerance, unspecified: Secondary | ICD-10-CM | POA: Diagnosis not present

## 2018-01-05 DIAGNOSIS — E785 Hyperlipidemia, unspecified: Secondary | ICD-10-CM

## 2018-01-05 DIAGNOSIS — R4182 Altered mental status, unspecified: Secondary | ICD-10-CM | POA: Diagnosis not present

## 2018-01-05 NOTE — Patient Instructions (Addendum)
Discontinue Ambien  Resume all medications as prescribed  Please follow-up with Dr. Clovis Pu  as soon as possible.  Return in 1 week for follow-up  Report any new or worsening symptoms

## 2018-01-05 NOTE — Progress Notes (Signed)
Subjective:    Patient ID: Rebecca Novak, female    DOB: May 24, 1946, 72 y.o.   MRN: [id number]  HPI  72 year old patient who is seen today accompanied by her sister. The patient has done poorly over the last 2 or 3 weeks. She has been followed by psychiatry, Dr. Clovis Pu, and has been prescribed multiple psychiatric medications.  The patient states that she discontinue these medications sometime ago.  She states that she has not been seen by psychiatry in approximately 4 months Her sister states that she had called the ambulance for times recently due to palpitations.  The patient complains of palpitations and a general sense of unwellness.  The patient is mentally slow  MMSE performed with a score of 23 out of 30.  She was unable to maintain the date days a week or month.  Unable to recall any of 3 objects.  No focal neurological symptoms history of falls or head trauma.  She has been prescribed Ambien in the past which remains on her medication list but she states that she has not taken this medication in some time  Past Medical History:  Diagnosis Date  . ANEMIA, MILD 05/17/2008  . COLONIC POLYPS, HX OF 05/17/2007  . Depression    bipolar  . GLUCOSE INTOLERANCE 11/23/2007  . HAND PAIN 07/31/2008  . HYPERLIPIDEMIA 02/05/2007  . HYPOTHYROIDISM 02/05/2007  . Skin cancer    "scraped off chest and RLE" (02/15/2013)  . SYSTOLIC MURMUR 11/94/1740     Social History   Socioeconomic History  . Marital status: Soil scientist    Spouse name: [name]  . Number of children: 0  . Years of education: Not on file  . Highest education level: Not on file  Occupational History  . 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 Financial resource strain: Not on file  . Food insecurity:    Worry: Not on file    Inability: Not on file  . Transportation needs:    Medical: Not on file    Non-medical: Not on file  Tobacco Use  . Smoking status: Former  Smoker    Packs/day: 0.33    Years: 44.00    Pack years: 14.52    Types: Cigarettes  . Smokeless tobacco: Never Used  . Tobacco comment: 02/15/2013 "stopped smoking cigarettes ~ 7 yr ago"  Substance and Sexual Activity  . Alcohol use: No  . Drug use: No  . Sexual activity: Yes  Lifestyle  . Physical activity:    Days per week: Not on file    Minutes per session: Not on file  . Stress: Not on file  Relationships  . Social connections:    Talks on phone: Not on file    Gets together: Not on file    Attends religious service: Not on file    Active member of club or organization: Not on file    Attends meetings of clubs or organizations: Not on file    Relationship status: Not on file  . Intimate partner violence:    Fear of current or ex partner: Not on file    Emotionally abused: Not on file    Physically abused: Not on file    Forced sexual activity: Not on file  Other Topics Concern  . Not on file  Social History Narrative  . Not on file    Past Surgical History:  Procedure Laterality Date  . APPENDECTOMY    .  TONSILLECTOMY    . VAGINAL HYSTERECTOMY      Family History  Problem Relation Age of Onset  . Dementia Mother   . Healthy Father        until death at age 59  . Mental illness Brother   . Healthy Sister     No Known Allergies  Current Outpatient Medications on File Prior to Visit  Medication Sig Dispense Refill  . Carbamazepine (EQUETRO) 300 MG CP12 Take 4 capsules by mouth daily. One capsule [300 mg] at 11:30 AM, one capsule [300 mg] at 7:30 PM, and two capsules [600 mg] at 11:30 PM.    . levothyroxine (SYNTHROID, LEVOTHROID) 100 MCG tablet Take 1 tablet (100 mcg total) by mouth daily. 90 tablet 4  . Lurasidone HCl (LATUDA) 120 MG TABS 120 mg.    . metoprolol tartrate (LOPRESSOR) 25 MG tablet Take 1 tablet (25 mg total) by mouth daily as needed (palpitations). 90 tablet 3  . Misc Natural Products (LUTEIN 20 PO) Take by mouth.    . Omega-3 Fatty Acids  (FISH OIL PO) Take by mouth 2 (two) times daily.    . QUEtiapine (SEROQUEL XR) 400 MG 24 hr tablet     . SF 5000 PLUS 1.1 % CREA dental cream Place 1 application onto teeth at bedtime.     . simvastatin (ZOCOR) 40 MG tablet TAKE 1 TABLET BY MOUTH AT BEDTIME 90 tablet 0   No current facility-administered medications on file prior to visit.     BP 128/62 (BP Location: Right Arm, Patient Position: Sitting, Cuff Size: Large)   Pulse 96   Temp 98.2 F (36.8 C) (Oral)   Wt 196 lb 6.4 oz (89.1 kg)   SpO2 96%   BMI 29.86 kg/m     Review of Systems  Constitutional: Positive for activity change and fatigue.  HENT: Negative for congestion, dental problem, hearing loss, rhinorrhea, sinus pressure, sore throat and tinnitus.   Eyes: Negative for pain, discharge and visual disturbance.  Respiratory: Negative for cough and shortness of breath.   Cardiovascular: Negative for chest pain, palpitations and leg swelling.  Gastrointestinal: Negative for abdominal distention, abdominal pain, blood in stool, constipation, diarrhea, nausea and vomiting.  Genitourinary: Negative for difficulty urinating, dysuria, flank pain, frequency, hematuria, pelvic pain, urgency, vaginal bleeding, vaginal discharge and vaginal pain.  Musculoskeletal: Negative for arthralgias, gait problem and joint swelling.  Skin: Negative for rash.  Neurological: Negative for dizziness, syncope, speech difficulty, weakness, numbness and headaches.  Hematological: Negative for adenopathy.  Psychiatric/Behavioral: Positive for behavioral problems, confusion and decreased concentration. Negative for agitation and dysphoric mood. The patient is nervous/anxious.        Objective:   Physical Exam  Constitutional: She is oriented to person, place, and time. She appears well-developed and well-nourished. No distress.  Blood pressure well controlled Pulse 90    HENT:  Head: Normocephalic.  Right Ear: External ear normal.  Left Ear:  External ear normal.  Mouth/Throat: Oropharynx is clear and moist.  Eyes: Pupils are equal, round, and reactive to light. Conjunctivae and EOM are normal.  Neck: Normal range of motion. Neck supple. No thyromegaly present.  Cardiovascular: Normal rate, regular rhythm, normal heart sounds and intact distal pulses.  Pulmonary/Chest: Effort normal and breath sounds normal.  Abdominal: Soft. Bowel sounds are normal. She exhibits no mass. There is no tenderness.  Musculoskeletal: Normal range of motion.  Lymphadenopathy:    She has no cervical adenopathy.  Neurological: She is alert and oriented  to person, place, and time.  Skin: Skin is warm and dry. No rash noted.  Psychiatric:  MMSE 23/30 Mentally slow            Assessment & Plan:   Subacute mental status changes.  Possible secondary to discontinuation of multiple psychoactive medications.  Will discontinue Ambien which apparently the patient is not taken in some time.  She has been asked to contact her psychiatrist for follow-up and to resume all psychoactive medications  We will check screening lab including TSH.  Patient does have a history of hypothyroidism and has not been taking her supplement.  Rule out severe hypothyroidism  Palpitations  Follow-up 1 week Resume all meds Psychiatric follow-up soon as possible  Consider head CT Review lab  Marletta Lor

## 2018-01-06 LAB — CBC WITH DIFFERENTIAL/PLATELET
BASOS ABS: 0.1 10*3/uL (ref 0.0–0.1)
BASOS PCT: 1.3 % (ref 0.0–3.0)
Eosinophils Absolute: 0 10*3/uL (ref 0.0–0.7)
Eosinophils Relative: 0.3 % (ref 0.0–5.0)
HEMATOCRIT: 40.2 % (ref 36.0–46.0)
Hemoglobin: 13.4 g/dL (ref 12.0–15.0)
LYMPHS ABS: 1.1 10*3/uL (ref 0.7–4.0)
Lymphocytes Relative: 18.5 % (ref 12.0–46.0)
MCHC: 33.3 g/dL (ref 30.0–36.0)
MCV: 87.2 fl (ref 78.0–100.0)
MONOS PCT: 6.1 % (ref 3.0–12.0)
Monocytes Absolute: 0.4 10*3/uL (ref 0.1–1.0)
NEUTROS ABS: 4.6 10*3/uL (ref 1.4–7.7)
NEUTROS PCT: 73.8 % (ref 43.0–77.0)
PLATELETS: 262 10*3/uL (ref 150.0–400.0)
RBC: 4.6 Mil/uL (ref 3.87–5.11)
RDW: 14.5 % (ref 11.5–15.5)
WBC: 6.2 10*3/uL (ref 4.0–10.5)

## 2018-01-06 LAB — COMPREHENSIVE METABOLIC PANEL
ALT: 16 U/L (ref 0–35)
AST: 24 U/L (ref 0–37)
Albumin: 4.4 g/dL (ref 3.5–5.2)
Alkaline Phosphatase: 64 U/L (ref 39–117)
BILIRUBIN TOTAL: 0.5 mg/dL (ref 0.2–1.2)
BUN: 22 mg/dL (ref 6–23)
CO2: 22 meq/L (ref 19–32)
Calcium: 10.1 mg/dL (ref 8.4–10.5)
Chloride: 105 mEq/L (ref 96–112)
Creatinine, Ser: 1.13 mg/dL (ref 0.40–1.20)
GFR: 50.01 mL/min — ABNORMAL LOW (ref >60.00)
GLUCOSE: 113 mg/dL — AB (ref 70–99)
POTASSIUM: 3.7 meq/L (ref 3.5–5.1)
SODIUM: 142 meq/L (ref 135–145)
TOTAL PROTEIN: 7.6 g/dL (ref 6.0–8.3)

## 2018-01-06 LAB — TSH: TSH: 1.05 u[IU]/mL (ref 0.35–4.50)

## 2018-01-06 LAB — SEDIMENTATION RATE: Sed Rate: 23 mm/hr (ref 0–30)

## 2018-01-07 ENCOUNTER — Telehealth: Payer: Self-pay | Admitting: Family Medicine

## 2018-01-07 DIAGNOSIS — R4182 Altered mental status, unspecified: Secondary | ICD-10-CM

## 2018-01-07 NOTE — Addendum Note (Signed)
Addended by: Gwenyth Ober R on: 01/07/2018 02:47 PM   Modules accepted: Orders

## 2018-01-07 NOTE — Telephone Encounter (Signed)
Home health referral placed. Ct placed as well.

## 2018-01-07 NOTE — Telephone Encounter (Signed)
Yes please set up for home health assistance due to increasing confusion weakness and high fall risk

## 2018-01-07 NOTE — Addendum Note (Signed)
Addended by: Gwenyth Ober R on: 01/07/2018 01:08 PM   Modules accepted: Orders

## 2018-01-07 NOTE — Telephone Encounter (Signed)
Copied from [place] [id number]. Topic: General - Other >> Jan 06, 2018  4:59 PM Valla Leaver wrote: Reason for CRM: Sister, [name], calling to find out if patient is eligible for home care? She needs her insurance information to request this and the patient is not taking her medications nor eating much. Would like a callback from Dr. Marthann Schiller cma.

## 2018-01-07 NOTE — Telephone Encounter (Signed)
Okay for referral?

## 2018-01-08 ENCOUNTER — Telehealth: Payer: Self-pay | Admitting: Internal Medicine

## 2018-01-08 ENCOUNTER — Inpatient Hospital Stay: Admission: RE | Admit: 2018-01-08 | Payer: Medicare Other | Source: Ambulatory Visit

## 2018-01-08 NOTE — Addendum Note (Signed)
Addended by: Gwenyth Ober R on: 01/08/2018 06:48 AM   Modules accepted: Orders

## 2018-01-08 NOTE — Telephone Encounter (Signed)
Copied from [place] [phone] [id number]. Topic: Quick Communication - See Telephone Encounter >> Jan 08, 2018  4:14 PM Rutherford Nail, [place] wrote: CRM for notification. See Telephone encounter for: 01/08/18. Patient's sister, [name], calling and would like a call back from [place]. Would like a call to help with transportation companies that could take patient to [facility]. CB#: [phone]

## 2018-01-11 ENCOUNTER — Inpatient Hospital Stay: Admission: RE | Admit: 2018-01-11 | Payer: Medicare Other | Source: Ambulatory Visit

## 2018-01-11 ENCOUNTER — Telehealth: Payer: Self-pay

## 2018-01-11 NOTE — Telephone Encounter (Signed)
Per [facility] dept the pt has been scheduled 3 times and called to cancel each time. They state she sounds very confused and has mentioned she is sick and can't get there.   Dr. Raliegh Ip - They need to know if they should continue to schedule pt. Please advise. Thanks!

## 2018-01-11 NOTE — Telephone Encounter (Signed)
Spoke with pt's friend, Elnora Morrison [facility]), he states he has become increasingly concerned about the pt and had wondered if she might need to be taken to the ED. He states she is resting now but he will discuss with her when she gets up. I advised him she needs to go to ED as soon as possible for evaluation, he agrees. Also advised him they can call me back with any questions. He voiced understanding.   Dr. Raliegh Ip - Juluis Rainier. Thanks!

## 2018-01-11 NOTE — Telephone Encounter (Signed)
Please call patient and suggest  she be evaluated in the emergency department for her acute confusional state and will obtain a head CT there

## 2018-01-11 NOTE — Telephone Encounter (Signed)
Lisa at [facility] called to advise pt has called them this afternoon and rescheduled CT to Wednesday. She will call me directly if pt cancels again. Dr. Raliegh Ip has been advised.

## 2018-01-12 ENCOUNTER — Encounter

## 2018-01-12 ENCOUNTER — Ambulatory Visit: Payer: Medicare Other | Admitting: Internal Medicine

## 2018-01-12 NOTE — Telephone Encounter (Signed)
Spoke to pt sister [name] and she stated that her and the pt will stop by on Thurs.to sign a DPR as well as pick up information regarding [employer] to help pt with transportation. [employer] application placed in filing cabinet.

## 2018-01-12 NOTE — Telephone Encounter (Signed)
Called patient, and also had conversation with  Tywanna's sister and [name].  She does sound much improved.  Mental status appears clear.  She is scheduled for head CT tomorrow and has follow-up with Dr. Clovis Pu on Friday.  She has resumed her psychotropic medications. Complaints today are weakness slight unsteadiness and dizziness.  ED evaluation recommended if any clinical deterioration.

## 2018-01-13 ENCOUNTER — Inpatient Hospital Stay: Admission: RE | Admit: 2018-01-13 | Payer: Medicare Other | Source: Ambulatory Visit

## 2018-01-14 ENCOUNTER — Telehealth: Payer: Self-pay | Admitting: Internal Medicine

## 2018-01-14 ENCOUNTER — Inpatient Hospital Stay: Admission: RE | Admit: 2018-01-14 | Payer: Medicare Other | Source: Ambulatory Visit

## 2018-01-14 NOTE — Telephone Encounter (Signed)
Stacy @ [facility] CT called to advise pt has canceled and rescheduled again. She is now scheduled for Friday, 8/23.  Dr. Raliegh Ip - Juluis Rainier, thanks.

## 2018-01-14 NOTE — Telephone Encounter (Signed)
Pt stated that she did not refuse HH and she didn't know what Nanine Means was talking about. She asked her friend [name] and he stated he did not meet them as well. Pt stated if they call her she will see what the mix up is.

## 2018-01-14 NOTE — Telephone Encounter (Signed)
Copied from [place] [phone]. Topic: Quick Communication - See Telephone Encounter >> Jan 14, 2018  3:14 PM Bea Graff, NT wrote: CRM for notification. See Telephone encounter for: 01/14/18. Megan with [facility] states that the patient refused for her to come to her home. CB#: [phone]

## 2018-01-15 ENCOUNTER — Telehealth: Payer: Self-pay | Admitting: Internal Medicine

## 2018-01-15 ENCOUNTER — Ambulatory Visit (INDEPENDENT_AMBULATORY_CARE_PROVIDER_SITE_OTHER)
Admission: RE | Admit: 2018-01-15 | Discharge: 2018-01-15 | Disposition: A | Discharge status: Home / Self Care | Payer: Medicare Other | Source: Ambulatory Visit | Attending: Internal Medicine | Admitting: Internal Medicine

## 2018-01-15 DIAGNOSIS — F3173 Bipolar disorder, in partial remission, most recent episode manic: Secondary | ICD-10-CM | POA: Diagnosis not present

## 2018-01-15 DIAGNOSIS — R4182 Altered mental status, unspecified: Secondary | ICD-10-CM | POA: Diagnosis not present

## 2018-01-15 NOTE — Telephone Encounter (Signed)
Noted  

## 2018-01-15 NOTE — Telephone Encounter (Signed)
Rebecca Novak dropped of forms for [facility]  Call patient to pick up forms at: [phone]  Disposition: Dr's folder

## 2018-01-17 ENCOUNTER — Emergency Department (HOSPITAL_COMMUNITY): Payer: Medicare Other

## 2018-01-17 ENCOUNTER — Inpatient Hospital Stay (HOSPITAL_COMMUNITY)
Admission: EM | Admit: 2018-01-17 | Discharge: 2018-01-21 | DRG: 093 | Disposition: A | Discharge status: Skilled Nursing Facility | Payer: Medicare Other | Attending: Internal Medicine | Admitting: Internal Medicine

## 2018-01-17 ENCOUNTER — Encounter (HOSPITAL_COMMUNITY): Payer: Self-pay

## 2018-01-17 ENCOUNTER — Other Ambulatory Visit: Payer: Self-pay

## 2018-01-17 DIAGNOSIS — T421X5A Adverse effect of iminostilbenes, initial encounter: Secondary | ICD-10-CM | POA: Diagnosis not present

## 2018-01-17 DIAGNOSIS — G9340 Encephalopathy, unspecified: Secondary | ICD-10-CM | POA: Diagnosis present

## 2018-01-17 DIAGNOSIS — F319 Bipolar disorder, unspecified: Secondary | ICD-10-CM | POA: Diagnosis not present

## 2018-01-17 DIAGNOSIS — R296 Repeated falls: Secondary | ICD-10-CM | POA: Diagnosis not present

## 2018-01-17 DIAGNOSIS — Z87891 Personal history of nicotine dependence: Secondary | ICD-10-CM

## 2018-01-17 DIAGNOSIS — E785 Hyperlipidemia, unspecified: Secondary | ICD-10-CM | POA: Diagnosis present

## 2018-01-17 DIAGNOSIS — S199XXA Unspecified injury of neck, initial encounter: Secondary | ICD-10-CM | POA: Diagnosis not present

## 2018-01-17 DIAGNOSIS — R5382 Chronic fatigue, unspecified: Secondary | ICD-10-CM

## 2018-01-17 DIAGNOSIS — R410 Disorientation, unspecified: Secondary | ICD-10-CM | POA: Diagnosis not present

## 2018-01-17 DIAGNOSIS — R42 Dizziness and giddiness: Secondary | ICD-10-CM | POA: Diagnosis not present

## 2018-01-17 DIAGNOSIS — S0993XA Unspecified injury of face, initial encounter: Secondary | ICD-10-CM | POA: Diagnosis not present

## 2018-01-17 DIAGNOSIS — Z79899 Other long term (current) drug therapy: Secondary | ICD-10-CM

## 2018-01-17 DIAGNOSIS — Z79890 Hormone replacement therapy (postmenopausal): Secondary | ICD-10-CM

## 2018-01-17 DIAGNOSIS — G92 Toxic encephalopathy: Secondary | ICD-10-CM | POA: Diagnosis not present

## 2018-01-17 DIAGNOSIS — E860 Dehydration: Secondary | ICD-10-CM | POA: Diagnosis present

## 2018-01-17 DIAGNOSIS — Z85828 Personal history of other malignant neoplasm of skin: Secondary | ICD-10-CM

## 2018-01-17 DIAGNOSIS — S0990XA Unspecified injury of head, initial encounter: Secondary | ICD-10-CM

## 2018-01-17 DIAGNOSIS — Z86010 Personal history of colonic polyps: Secondary | ICD-10-CM

## 2018-01-17 DIAGNOSIS — R011 Cardiac murmur, unspecified: Secondary | ICD-10-CM | POA: Diagnosis present

## 2018-01-17 DIAGNOSIS — E7439 Other disorders of intestinal carbohydrate absorption: Secondary | ICD-10-CM | POA: Diagnosis present

## 2018-01-17 DIAGNOSIS — S0121XA Laceration without foreign body of nose, initial encounter: Secondary | ICD-10-CM | POA: Diagnosis not present

## 2018-01-17 DIAGNOSIS — R5381 Other malaise: Secondary | ICD-10-CM

## 2018-01-17 DIAGNOSIS — E039 Hypothyroidism, unspecified: Secondary | ICD-10-CM | POA: Diagnosis not present

## 2018-01-17 DIAGNOSIS — T421X4S Poisoning by iminostilbenes, undetermined, sequela: Secondary | ICD-10-CM

## 2018-01-17 DIAGNOSIS — S299XXA Unspecified injury of thorax, initial encounter: Secondary | ICD-10-CM | POA: Diagnosis not present

## 2018-01-17 DIAGNOSIS — Z90710 Acquired absence of both cervix and uterus: Secondary | ICD-10-CM

## 2018-01-17 DIAGNOSIS — Z818 Family history of other mental and behavioral disorders: Secondary | ICD-10-CM

## 2018-01-17 DIAGNOSIS — S80212A Abrasion, left knee, initial encounter: Secondary | ICD-10-CM | POA: Diagnosis not present

## 2018-01-17 DIAGNOSIS — W19XXXA Unspecified fall, initial encounter: Secondary | ICD-10-CM | POA: Diagnosis not present

## 2018-01-17 DIAGNOSIS — R531 Weakness: Secondary | ICD-10-CM | POA: Diagnosis not present

## 2018-01-17 LAB — BASIC METABOLIC PANEL
Anion gap: 11 (ref 5–15)
BUN: 26 mg/dL — ABNORMAL HIGH (ref 8–23)
CHLORIDE: 107 mmol/L (ref 98–111)
CO2: 25 mmol/L (ref 22–32)
Calcium: 9.2 mg/dL (ref 8.9–10.3)
Creatinine, Ser: 0.84 mg/dL (ref 0.44–1.00)
GFR calc Af Amer: >60 mL/min (ref >60)
GFR calc non Af Amer: >60 mL/min (ref >60)
GLUCOSE: 138 mg/dL — AB (ref 70–99)
POTASSIUM: 4 mmol/L (ref 3.5–5.1)
SODIUM: 143 mmol/L (ref 135–145)

## 2018-01-17 LAB — CBC WITH DIFFERENTIAL/PLATELET
Basophils Absolute: 0 10*3/uL (ref 0.0–0.1)
Basophils Relative: 0 %
EOS PCT: 0 %
Eosinophils Absolute: 0 10*3/uL (ref 0.0–0.7)
HCT: 37.6 % (ref 36.0–46.0)
HEMOGLOBIN: 12.3 g/dL (ref 12.0–15.0)
LYMPHS ABS: 0.4 10*3/uL — AB (ref 0.7–4.0)
LYMPHS PCT: 4 %
MCH: 28.8 pg (ref 26.0–34.0)
MCHC: 32.7 g/dL (ref 30.0–36.0)
MCV: 88.1 fL (ref 78.0–100.0)
MONOS PCT: 3 %
Monocytes Absolute: 0.3 10*3/uL (ref 0.1–1.0)
Neutro Abs: 9 10*3/uL — ABNORMAL HIGH (ref 1.7–7.7)
Neutrophils Relative %: 93 %
Platelets: 231 10*3/uL (ref 150–400)
RBC: 4.27 MIL/uL (ref 3.87–5.11)
RDW: 13.8 % (ref 11.5–15.5)
WBC: 9.7 10*3/uL (ref 4.0–10.5)

## 2018-01-17 MED ORDER — SODIUM CHLORIDE 0.9 % IV BOLUS
1000.0000 mL | Freq: Once | INTRAVENOUS | Status: AC
  Administered 2018-01-17: 1000 mL via INTRAVENOUS

## 2018-01-17 MED ORDER — SODIUM CHLORIDE 0.9 % IV SOLN
INTRAVENOUS | Status: DC
  Administered 2018-01-18: 01:00:00 via INTRAVENOUS

## 2018-01-17 NOTE — ED Notes (Addendum)
Pt is unsteady at this time. Will try to ambulate and perform orthostatics at a different time.

## 2018-01-17 NOTE — ED Triage Notes (Signed)
Pt was brought by [facility] due to dizziness and recent falls. Pt's psychiatrist has been out of town for the last 2 weeks and her PCP took over prescribing her psych medications. Per EMS, pt has not been taking the correct psych meds and has been taking her Metoprolol incorrectly. Per EMS, since the med change, pt has felt dizzy, fatigued, and has had multiple falls. Per EMS, pt's family wants pt placed in a rehab facility.

## 2018-01-17 NOTE — ED Provider Notes (Signed)
[facility] DEPT Provider Note   CSN: [id number] Arrival date & time: 01/17/18  2016     History   Chief Complaint Chief Complaint  Patient presents with  . Fall  . Dizziness    HPI Rebecca Novak is a 72 y.o. female.  HPI   She presents for evaluation of confusion and falling.  She reportedly fell today.  She is a moderately poor historian.  She came by EMS.  At 9:15 PM no one is with her at this time.  She has been seeing her PCP who ordered a CT head for evaluation confusion 2 days ago.  Family members have indicated to EMS that they want her placed in a rehab facility.  Level 5 caveat-altered mental status  Patient sister arrived to the room and I was able to speak to her at 10:45 PM.  She states that the patient lives with her significant other and that he has become more debilitated recently.  He typically helps her take her medicines but several weeks ago he stopped doing that and the patient did not take her medicines.  Family members realize this about a week and a half ago and encouraged her to start taking her medicines again which she apparently did.  The sister took the patient to her PCP for an appointment, about 10 days ago.  She then took the patient to an appointment with her psychiatrist 3 days ago when he made some medication changes.  He told her to stop taking Quetiapine ER, and use Quetiapine 200 mg at bedtime.  Sister apparently lives in [place] and sees the patient every couple weeks.  The patient's sister wants the patient to be admitted to a rehab facility because of the following and the fact that her partner cannot help the patient get up from the floor after she falls.  I let the patient's sister know that we can check her for acute problems that might require hospitalization or observation, but that we can not place her into rehab from the ED.  I let her know that we can get some home health care to assess  her and treat her there.  Past Medical History:  Diagnosis Date  . ANEMIA, MILD 05/17/2008  . COLONIC POLYPS, HX OF 05/17/2007  . Depression    bipolar  . GLUCOSE INTOLERANCE 11/23/2007  . HAND PAIN 07/31/2008  . HYPERLIPIDEMIA 02/05/2007  . HYPOTHYROIDISM 02/05/2007  . Skin cancer    "scraped off chest and RLE" (02/15/2013)  . SYSTOLIC MURMUR 09/47/0962    Patient Active Problem List   Diagnosis Date Noted  . Palpitations 12/02/2017  . Syncope 10/26/2015  . Nausea & vomiting 02/15/2013  . Weakness of both legs 02/15/2013  . Orthostatic hypotension 02/15/2013  . HAND PAIN 07/31/2008  . Anemia 05/17/2008  . GLUCOSE INTOLERANCE 11/23/2007  . History of colonic polyps 05/17/2007  . Hypothyroidism 02/05/2007  . Dyslipidemia 02/05/2007    Past Surgical History:  Procedure Laterality Date  . APPENDECTOMY    . TONSILLECTOMY    . VAGINAL HYSTERECTOMY       OB History   None      Home Medications    Prior to Admission medications   Medication Sig Start Date End Date Taking? Authorizing Provider  Carbamazepine (EQUETRO) 300 MG CP12 Take 600 mg by mouth 2 (two) times daily.    Yes [provider]  levothyroxine (SYNTHROID, LEVOTHROID) 100 MCG tablet Take 1 tablet (100 mcg total)  by mouth daily. 07/09/17  Yes Marletta Lor, MD  Lurasidone HCl (LATUDA) 120 MG TABS Take 120 mg by mouth every evening.    Yes [provider]  metoprolol tartrate (LOPRESSOR) 25 MG tablet Take 1 tablet (25 mg total) by mouth daily as needed (palpitations). 12/02/17  Yes Marletta Lor, MD  QUEtiapine (SEROQUEL XR) 200 MG 24 hr tablet Take 200 mg by mouth at bedtime.   Yes [provider]  SF 5000 PLUS 1.1 % CREA dental cream Place 1 application onto teeth at bedtime.  10/30/11  Yes [provider]  simvastatin (ZOCOR) 40 MG tablet TAKE 1 TABLET BY MOUTH AT BEDTIME Patient taking differently: Take 40 mg by mouth every evening.  10/20/17  Yes Marletta Lor, MD    Family History Family History  Problem Relation Age of Onset  . Dementia Mother   . Healthy Father        until death at age 38  . Mental illness Brother   . Healthy Sister     Social History Social History   Tobacco Use  . Smoking status: Former Smoker    Packs/day: 0.33    Years: 44.00    Pack years: 14.52    Types: Cigarettes  . Smokeless tobacco: Never Used  . Tobacco comment: 02/15/2013 "stopped smoking cigarettes ~ 7 yr ago"  Substance Use Topics  . Alcohol use: No  . Drug use: No     Allergies   Patient has no known allergies.   Review of Systems Review of Systems  Unable to perform ROS: Mental status change     Physical Exam Updated Vital Signs BP (!) 171/85 (BP Location: Left Arm)   Pulse 88   Temp 97.8 F (36.6 C) (Oral)   Resp 17   Ht 5\' 8"  (1.727 m)   Wt 89.1 kg   SpO2 96%   BMI 29.86 kg/m   Physical Exam  Constitutional: She appears well-developed and well-nourished.  HENT:  Head: Normocephalic.  Contusion left forehead, bridge of nose, and left chin.  No trismus.  No palpable skull defect or crepitation.  Eyes: Pupils are equal, round, and reactive to light. Conjunctivae and EOM are normal.  Neck: Normal range of motion and phonation normal. Neck supple.  Cardiovascular: Normal rate and regular rhythm.  Pulmonary/Chest: Effort normal and breath sounds normal. She exhibits no tenderness (No chest wall tenderness or crepitation).  Abdominal: Soft. She exhibits no distension. There is no tenderness. There is no guarding.  Musculoskeletal: Normal range of motion. She exhibits no deformity.  Normal range of motion arms and legs bilaterally.  Neurological: She is alert. She exhibits normal muscle tone.  Skin: Skin is warm and dry.  Psychiatric: Her behavior is normal. Thought content normal.  She appears depressed  Nursing note and vitals reviewed.    ED Treatments / Results  Labs (all labs ordered are listed, but only  abnormal results are displayed) Labs Reviewed  BASIC METABOLIC PANEL - Abnormal; Notable for the following components:      Result Value   Glucose, Bld 138 (*)    BUN 26 (*)    All other components within normal limits  CBC WITH DIFFERENTIAL/PLATELET - Abnormal; Notable for the following components:   Neutro Abs 9.0 (*)    Lymphs Abs 0.4 (*)    All other components within normal limits  URINALYSIS, ROUTINE W REFLEX MICROSCOPIC    EKG None  Radiology Dg Chest 2 View  Result Date: 01/17/2018 CLINICAL DATA:  Fall EXAM: CHEST - 2 VIEW COMPARISON:  12/12/2017 FINDINGS: Heart is normal size. No confluent airspace opacities or effusions. No acute bony abnormality or pneumothorax. IMPRESSION: No acute cardiopulmonary disease. Electronically Signed   By: Rolm Baptise M.D.   On: 01/17/2018 22:38   Ct Head Wo Contrast  Result Date: 01/17/2018 CLINICAL DATA:  Dizziness, fall. EXAM: CT HEAD WITHOUT CONTRAST CT CERVICAL SPINE WITHOUT CONTRAST TECHNIQUE: Multidetector CT imaging of the head and cervical spine was performed following the standard protocol without intravenous contrast. Multiplanar CT image reconstructions of the cervical spine were also generated. COMPARISON:  CT head 02/14/2013 FINDINGS: CT HEAD FINDINGS Brain: There is atrophy and chronic small vessel disease changes. No acute intracranial abnormality. Specifically, no hemorrhage, hydrocephalus, mass lesion, acute infarction, or significant intracranial injury. Vascular: No hyperdense vessel or unexpected calcification. Skull: No acute calvarial abnormality. Sinuses/Orbits: Visualized paranasal sinuses and mastoids clear. Orbital soft tissues unremarkable. Other: None CT CERVICAL SPINE FINDINGS Alignment: No subluxation.  Loss of cervical lordosis. Skull base and vertebrae: No acute fracture. No primary bone lesion or focal pathologic process. Soft tissues and spinal canal: No prevertebral fluid or swelling. No visible canal hematoma. Disc  levels: Diffuse degenerative disc disease and facet disease bilaterally. Upper chest: No acute findings Other: No acute findings IMPRESSION: No acute intracranial abnormality. Atrophy, chronic microvascular disease. Mild degenerative changes diffusely throughout the cervical spine. No acute bony abnormality. Loss of cervical lordosis. Electronically Signed   By: Rolm Baptise M.D.   On: 01/17/2018 23:00   Ct Cervical Spine Wo Contrast  Result Date: 01/17/2018 CLINICAL DATA:  Dizziness, fall. EXAM: CT HEAD WITHOUT CONTRAST CT CERVICAL SPINE WITHOUT CONTRAST TECHNIQUE: Multidetector CT imaging of the head and cervical spine was performed following the standard protocol without intravenous contrast. Multiplanar CT image reconstructions of the cervical spine were also generated. COMPARISON:  CT head 02/14/2013 FINDINGS: CT HEAD FINDINGS Brain: There is atrophy and chronic small vessel disease changes. No acute intracranial abnormality. Specifically, no hemorrhage, hydrocephalus, mass lesion, acute infarction, or significant intracranial injury. Vascular: No hyperdense vessel or unexpected calcification. Skull: No acute calvarial abnormality. Sinuses/Orbits: Visualized paranasal sinuses and mastoids clear. Orbital soft tissues unremarkable. Other: None CT CERVICAL SPINE FINDINGS Alignment: No subluxation.  Loss of cervical lordosis. Skull base and vertebrae: No acute fracture. No primary bone lesion or focal pathologic process. Soft tissues and spinal canal: No prevertebral fluid or swelling. No visible canal hematoma. Disc levels: Diffuse degenerative disc disease and facet disease bilaterally. Upper chest: No acute findings Other: No acute findings IMPRESSION: No acute intracranial abnormality. Atrophy, chronic microvascular disease. Mild degenerative changes diffusely throughout the cervical spine. No acute bony abnormality. Loss of cervical lordosis. Electronically Signed   By: Rolm Baptise M.D.   On: 01/17/2018  23:00    Procedures Procedures (including critical care time)  Medications Ordered in ED Medications  0.9 %  sodium chloride infusion (has no administration in time range)  sodium chloride 0.9 % bolus 1,000 mL (1,000 mLs Intravenous New Bag/Given 01/17/18 2255)     Initial Impression / Assessment and Plan / ED Course  I have reviewed the triage vital signs and the nursing notes.  Pertinent labs & imaging results that were available during my care of the patient were reviewed by me and considered in my medical decision making (see chart for details).  Clinical Course as of Jan 18 2355  Nancy Fetter Jan 17, 2018  2308 Normal  CBC with Differential(!) [  EW]  2309 No intracranial injury, images reviewed by me  CT Head Wo Contrast [EW]  2309 No cervical spine fracture, images reviewed by me  CT Cervical Spine Wo Contrast [EW]  2309 No infiltrate, CHF or fracture, images reviewed by me  DG Chest 2 View [EW]  2344 Normal except glucose high, BUN high  Basic metabolic panel(!) [EW]  9449 The patient's sister, Navah Grondin, wants to be contacted for help with initiating home health services and ongoing care.   [EW]    Clinical Course User Index [EW] Daleen Bo, MD     Patient Vitals for the past 24 hrs:  BP Temp Temp src Pulse Resp SpO2 Height Weight  01/17/18 2258 (!) 171/85 - - 88 17 96 % - -  01/17/18 2038 - - - - - - 5\' 8"  (1.727 m) 89.1 kg  01/17/18 2026 (!) 152/85 97.8 F (36.6 C) Oral 84 18 99 % - -  01/17/18 2024 - - - - - 100 % - -    11:07 PM Reevaluation with update and discussion. After initial assessment and treatment, an updated evaluation reveals no change in status at this time.  Family member updated on findings and plan.  Sister plans on taking her home tonight and will help to make sure she gets the care she needs to including home health services.  Patient sister wants to be contacted for involvement in the patient's care.  The patient's sister is named Rebecca Novak. Daleen Bo   Medical Decision Making: Lucky Rathke, with possible medical problems, and fall injuring face and head.  Screening evaluation done for metabolic or infectious processes.  Imaging done to assess for injuries.  CRITICAL CARE- no Performed by: Daleen Bo   Nursing Notes Reviewed/ Care Coordinated Applicable Imaging Reviewed Interpretation of Laboratory Data incorporated into ED treatment  Nursing Notes Reviewed/ Care Coordinated Applicable Imaging Reviewed Interpretation of Laboratory Data incorporated into ED treatment  Final disposition pending urinalysis, anticipate home with symptomatic treatment and home health services.   Final Clinical Impressions(s) / ED Diagnoses   Final diagnoses:  Injury of head, initial encounter  Fall, initial encounter  Chronic fatigue and malaise  Dehydration    ED Discharge Orders         [place]     01/17/18 2354    Face-to-face encounter (required for Medicare/Medicaid patients)    Comments:  I Daleen Bo certify that this patient is under my care and that I, or a nurse practitioner or physician's assistant working with me, had a face-to-face encounter that meets the physician face-to-face encounter requirements with this patient on 01/17/2018. The encounter with the patient was in whole, or in part for the following medical condition(s) which is the primary reason for home health care (List medical condition): Weakness, fall   01/17/18 2354           Daleen Bo, MD 01/18/18 0004

## 2018-01-17 NOTE — ED Notes (Signed)
Patient transported to CT 

## 2018-01-17 NOTE — Discharge Instructions (Signed)
It appears that she fell because she was dehydrated and weak.  It is important to drink plenty of fluids, and eat 3 meals a day.  Also take all medications as directed.  We are going to have home health come out and assess several things that should be able to help her get better or get additional assistance if needed.

## 2018-01-17 NOTE — ED Notes (Signed)
Patient transported to X-ray 

## 2018-01-17 NOTE — ED Notes (Signed)
Bed: LG92 Expected date:  Expected time:  Means of arrival:  Comments: 72 yo F/Lethargic weakness-falls

## 2018-01-18 ENCOUNTER — Emergency Department (HOSPITAL_COMMUNITY): Payer: Medicare Other

## 2018-01-18 ENCOUNTER — Encounter (HOSPITAL_COMMUNITY): Payer: Self-pay | Admitting: Family Medicine

## 2018-01-18 ENCOUNTER — Telehealth: Payer: Self-pay | Admitting: Internal Medicine

## 2018-01-18 DIAGNOSIS — S80212A Abrasion, left knee, initial encounter: Secondary | ICD-10-CM | POA: Diagnosis not present

## 2018-01-18 DIAGNOSIS — R410 Disorientation, unspecified: Secondary | ICD-10-CM | POA: Diagnosis not present

## 2018-01-18 DIAGNOSIS — Z7401 Bed confinement status: Secondary | ICD-10-CM | POA: Diagnosis not present

## 2018-01-18 DIAGNOSIS — T421X4D Poisoning by iminostilbenes, undetermined, subsequent encounter: Secondary | ICD-10-CM | POA: Diagnosis not present

## 2018-01-18 DIAGNOSIS — R296 Repeated falls: Secondary | ICD-10-CM | POA: Diagnosis present

## 2018-01-18 DIAGNOSIS — Z79899 Other long term (current) drug therapy: Secondary | ICD-10-CM | POA: Diagnosis not present

## 2018-01-18 DIAGNOSIS — W19XXXA Unspecified fall, initial encounter: Secondary | ICD-10-CM | POA: Diagnosis not present

## 2018-01-18 DIAGNOSIS — Z86010 Personal history of colonic polyps: Secondary | ICD-10-CM | POA: Diagnosis not present

## 2018-01-18 DIAGNOSIS — T421X5A Adverse effect of iminostilbenes, initial encounter: Secondary | ICD-10-CM | POA: Diagnosis present

## 2018-01-18 DIAGNOSIS — T421X4S Poisoning by iminostilbenes, undetermined, sequela: Secondary | ICD-10-CM | POA: Diagnosis not present

## 2018-01-18 DIAGNOSIS — E039 Hypothyroidism, unspecified: Secondary | ICD-10-CM | POA: Diagnosis present

## 2018-01-18 DIAGNOSIS — F319 Bipolar disorder, unspecified: Secondary | ICD-10-CM | POA: Diagnosis present

## 2018-01-18 DIAGNOSIS — M2550 Pain in unspecified joint: Secondary | ICD-10-CM | POA: Diagnosis not present

## 2018-01-18 DIAGNOSIS — R42 Dizziness and giddiness: Secondary | ICD-10-CM | POA: Diagnosis not present

## 2018-01-18 DIAGNOSIS — Z90710 Acquired absence of both cervix and uterus: Secondary | ICD-10-CM | POA: Diagnosis not present

## 2018-01-18 DIAGNOSIS — G9340 Encephalopathy, unspecified: Secondary | ICD-10-CM | POA: Diagnosis present

## 2018-01-18 DIAGNOSIS — G92 Toxic encephalopathy: Secondary | ICD-10-CM | POA: Diagnosis present

## 2018-01-18 DIAGNOSIS — T421X1A Poisoning by iminostilbenes, accidental (unintentional), initial encounter: Secondary | ICD-10-CM | POA: Diagnosis not present

## 2018-01-18 DIAGNOSIS — Z79890 Hormone replacement therapy (postmenopausal): Secondary | ICD-10-CM | POA: Diagnosis not present

## 2018-01-18 DIAGNOSIS — E860 Dehydration: Secondary | ICD-10-CM | POA: Diagnosis present

## 2018-01-18 DIAGNOSIS — M6281 Muscle weakness (generalized): Secondary | ICD-10-CM | POA: Diagnosis not present

## 2018-01-18 DIAGNOSIS — Z818 Family history of other mental and behavioral disorders: Secondary | ICD-10-CM | POA: Diagnosis not present

## 2018-01-18 DIAGNOSIS — R2689 Other abnormalities of gait and mobility: Secondary | ICD-10-CM | POA: Diagnosis not present

## 2018-01-18 DIAGNOSIS — Z87891 Personal history of nicotine dependence: Secondary | ICD-10-CM | POA: Diagnosis not present

## 2018-01-18 DIAGNOSIS — Z85828 Personal history of other malignant neoplasm of skin: Secondary | ICD-10-CM | POA: Diagnosis not present

## 2018-01-18 DIAGNOSIS — R011 Cardiac murmur, unspecified: Secondary | ICD-10-CM | POA: Diagnosis present

## 2018-01-18 DIAGNOSIS — S199XXA Unspecified injury of neck, initial encounter: Secondary | ICD-10-CM | POA: Diagnosis not present

## 2018-01-18 DIAGNOSIS — S0993XA Unspecified injury of face, initial encounter: Secondary | ICD-10-CM | POA: Diagnosis not present

## 2018-01-18 DIAGNOSIS — L859 Epidermal thickening, unspecified: Secondary | ICD-10-CM | POA: Diagnosis not present

## 2018-01-18 DIAGNOSIS — E7439 Other disorders of intestinal carbohydrate absorption: Secondary | ICD-10-CM | POA: Diagnosis present

## 2018-01-18 DIAGNOSIS — E785 Hyperlipidemia, unspecified: Secondary | ICD-10-CM | POA: Diagnosis present

## 2018-01-18 LAB — BASIC METABOLIC PANEL
Anion gap: 10 (ref 5–15)
BUN: 22 mg/dL (ref 8–23)
CHLORIDE: 109 mmol/L (ref 98–111)
CO2: 24 mmol/L (ref 22–32)
CREATININE: 0.83 mg/dL (ref 0.44–1.00)
Calcium: 8.7 mg/dL — ABNORMAL LOW (ref 8.9–10.3)
GFR calc Af Amer: >60 mL/min (ref >60)
GFR calc non Af Amer: >60 mL/min (ref >60)
Glucose, Bld: 129 mg/dL — ABNORMAL HIGH (ref 70–99)
Potassium: 3.7 mmol/L (ref 3.5–5.1)
Sodium: 143 mmol/L (ref 135–145)

## 2018-01-18 LAB — URINALYSIS, ROUTINE W REFLEX MICROSCOPIC
BILIRUBIN URINE: NEGATIVE
Glucose, UA: NEGATIVE mg/dL
Hgb urine dipstick: NEGATIVE
Ketones, ur: NEGATIVE mg/dL
Leukocytes, UA: NEGATIVE
NITRITE: NEGATIVE
PH: 5 (ref 5.0–8.0)
Protein, ur: NEGATIVE mg/dL
Specific Gravity, Urine: 1.016 (ref 1.005–1.030)

## 2018-01-18 LAB — CARBAMAZEPINE LEVEL, TOTAL: Carbamazepine Lvl: 15.7 ug/mL (ref 4.0–12.0)

## 2018-01-18 LAB — MAGNESIUM: Magnesium: 1.9 mg/dL (ref 1.7–2.4)

## 2018-01-18 LAB — VITAMIN B12: Vitamin B-12: 611 pg/mL (ref 180–914)

## 2018-01-18 LAB — RPR: RPR Ser Ql: NONREACTIVE

## 2018-01-18 LAB — AMMONIA: Ammonia: 21 umol/L (ref 9–35)

## 2018-01-18 MED ORDER — LEVOTHYROXINE SODIUM 75 MCG PO TABS
75.0000 ug | ORAL_TABLET | Freq: Every day | ORAL | Status: DC
  Administered 2018-01-19 – 2018-01-21 (×3): 75 ug via ORAL
  Filled 2018-01-18 (×3): qty 1

## 2018-01-18 MED ORDER — HYDRALAZINE HCL 20 MG/ML IJ SOLN: 10.0000 mg | Freq: Three times a day (TID) | INTRAMUSCULAR | Status: DC | PRN

## 2018-01-18 MED ORDER — ONDANSETRON HCL 4 MG PO TABS: 4.0000 mg | ORAL_TABLET | Freq: Four times a day (QID) | ORAL | Status: DC | PRN

## 2018-01-18 MED ORDER — LURASIDONE HCL 40 MG PO TABS
120.0000 mg | ORAL_TABLET | Freq: Every evening | ORAL | Status: DC
  Administered 2018-01-18 – 2018-01-20 (×3): 120 mg via ORAL
  Filled 2018-01-18 (×4): qty 3

## 2018-01-18 MED ORDER — QUETIAPINE FUMARATE ER 200 MG PO TB24
200.0000 mg | ORAL_TABLET | Freq: Every day | ORAL | Status: DC
  Administered 2018-01-18 – 2018-01-20 (×3): 200 mg via ORAL
  Filled 2018-01-18 (×3): qty 1

## 2018-01-18 MED ORDER — SODIUM CHLORIDE 0.9 % IV SOLN
INTRAVENOUS | Status: AC
  Administered 2018-01-18: 12:00:00 via INTRAVENOUS

## 2018-01-18 MED ORDER — CARBAMAZEPINE ER 200 MG PO CP12
600.0000 mg | ORAL_CAPSULE | Freq: Two times a day (BID) | ORAL | Status: DC
  Filled 2018-01-18: qty 3

## 2018-01-18 MED ORDER — ONDANSETRON HCL 4 MG/2ML IJ SOLN
4.0000 mg | Freq: Four times a day (QID) | INTRAMUSCULAR | Status: DC | PRN
  Administered 2018-01-18: 4 mg via INTRAVENOUS
  Filled 2018-01-18: qty 2

## 2018-01-18 MED ORDER — CARBAMAZEPINE ER 200 MG PO CP12
600.0000 mg | ORAL_CAPSULE | Freq: Two times a day (BID) | ORAL | Status: DC
  Administered 2018-01-18: 600 mg via ORAL
  Filled 2018-01-18: qty 3

## 2018-01-18 MED ORDER — SIMVASTATIN 40 MG PO TABS
40.0000 mg | ORAL_TABLET | Freq: Every evening | ORAL | Status: DC
  Administered 2018-01-18 – 2018-01-20 (×3): 40 mg via ORAL
  Filled 2018-01-18 (×3): qty 1

## 2018-01-18 MED ORDER — SENNOSIDES-DOCUSATE SODIUM 8.6-50 MG PO TABS: 1.0000 | ORAL_TABLET | Freq: Every evening | ORAL | Status: DC | PRN

## 2018-01-18 MED ORDER — ACETAMINOPHEN 650 MG RE SUPP: 650.0000 mg | Freq: Four times a day (QID) | RECTAL | Status: DC | PRN

## 2018-01-18 MED ORDER — LIDOCAINE-EPINEPHRINE-TETRACAINE (LET) SOLUTION
3.0000 mL | Freq: Once | NASAL | Status: AC
  Administered 2018-01-18: 3 mL via TOPICAL
  Filled 2018-01-18: qty 3

## 2018-01-18 MED ORDER — SODIUM FLUORIDE 1.1 % DT CREA: 1.0000 "application " | TOPICAL_CREAM | Freq: Every day | DENTAL | Status: DC

## 2018-01-18 MED ORDER — ENOXAPARIN SODIUM 40 MG/0.4ML ~~LOC~~ SOLN: 40.0000 mg | SUBCUTANEOUS | Status: DC

## 2018-01-18 MED ORDER — ACETAMINOPHEN 325 MG PO TABS
650.0000 mg | ORAL_TABLET | Freq: Four times a day (QID) | ORAL | Status: DC | PRN
  Administered 2018-01-20: 650 mg via ORAL
  Filled 2018-01-18: qty 2

## 2018-01-18 MED ORDER — SODIUM CHLORIDE 0.9 % IV SOLN
INTRAVENOUS | Status: DC
  Administered 2018-01-18: 04:00:00 via INTRAVENOUS

## 2018-01-18 NOTE — Care Management Note (Signed)
Case Management Note  Patient Details  Name: Rebecca Novak MRN: [id number] Date of Birth: [birth year]  Subjective/Objective:                  72 y.o. female with medical history significant for bipolar disorder and hypothyroidism, now presenting to the emergency department for evaluation of increased confusion and recurrent falls.  Patient lives with a significant other who had previously been cooking for her and assisting with her medications, but has unfortunately been doing poorly with his cancer and has been unable to cook or manage her medications in several weeks.  Family became aware that she had not been taking her medications for several weeks and she resumed them at her family's direction approximately 1-1/2 weeks ago.  She has had progressive worsening confusion and off-balance gait with recurrent falls, having hit her head at least once.  Patient has not expressed any specific complaints.  At her baseline, she is able to ambulate unassisted, feed herself, and dress herself.  Her sister reports that she has been persistently confused and unsteady on her feet, and with disorientation that waxes and wanes some.  They suspect that she has not taken her medications correctly.  She is not known to use alcohol or illicit substances.  She was recently evaluated by her PCP with a normal TSH and ESR, but has continued to worsen since then.  Action/Plan: Following for progression and cm needs  Expected Discharge Date:                  Expected Discharge Plan:  Home/Self Care  In-House Referral:     Discharge planning Services  CM Consult  Post Acute Care Choice:    Choice offered to:     DME Arranged:    DME Agency:     HH Arranged:    HH Agency:     Status of Service:  In process, will continue to follow  If discussed at Long Length of Stay Meetings, dates discussed:    Additional Comments:  Leeroy Cha, RN 01/18/2018, 11:14 AM

## 2018-01-18 NOTE — Telephone Encounter (Signed)
Okay for referral?

## 2018-01-18 NOTE — Evaluation (Signed)
Physical Therapy Evaluation Patient Details Name: Rebecca Novak MRN: [id number] DOB: [birth year] Today's Date: 01/18/2018   History of Present Illness  72 y.o. female with medical history significant for bipolar disorder and hypothyroidism, presenting to the ED for evaluation for worsening confusion and recurrent falls  Clinical Impression  Pt admitted with above diagnosis. Pt currently with functional limitations due to the deficits listed below (see PT Problem List).  Pt will benefit from skilled PT to increase their independence and safety with mobility to allow discharge to the venue listed below.  Pt requires multimodal cues for technique and safety.  Pt requiring at least mod assist for mobility at this time and presents as high fall risk. Recommend SNF upon d/c.    Follow Up Recommendations SNF;Supervision/Assistance - 24 hour    Equipment Recommendations  Rolling walker with 5" wheels    Recommendations for Other Services       Precautions / Restrictions Precautions Precautions: Fall      Mobility  Bed Mobility Overal bed mobility: Needs Assistance Bed Mobility: Supine to Sit;Sit to Supine     Supine to sit: Min assist Sit to supine: Min assist   General bed mobility comments: multimodal cues for technique, assist for trunk upright and LEs onto bed  Transfers Overall transfer level: Needs assistance Equipment used: Rolling walker (2 wheeled) Transfers: Sit to/from Stand Sit to Stand: Mod assist;From elevated surface         General transfer comment: assist to rise and steady, multimodal cues for safe technique  Ambulation/Gait             General Gait Details: deferred for safety, pt with difficulty and requiring assist for marching in place with RW (would need +2)  Stairs            Wheelchair Mobility    Modified Rankin (Stroke Patients Only)       Balance Overall balance assessment: History of Falls;Needs assistance          Standing balance support: Bilateral upper extremity supported Standing balance-Leahy Scale: Zero Standing balance comment: pt requires UE support and external assist for static standing, at least mod assist for steadying with marching in place                             Pertinent Vitals/Pain Pain Assessment: No/denies pain    Home Living Family/patient expects to be discharged to:: Private residence Living Arrangements: Spouse/significant other   Type of Home: [place]: One level Home Equipment: None Additional Comments: per previous admission, pt poor historian    Prior Function Level of Independence: Independent               Hand Dominance        Extremity/Trunk Assessment   Upper Extremity Assessment Upper Extremity Assessment: Generalized weakness    Lower Extremity Assessment Lower Extremity Assessment: Generalized weakness       Communication   Communication: No difficulties  Cognition Arousal/Alertness: Awake/alert Behavior During Therapy: Flat affect Overall Cognitive Status: No family/caregiver present to determine baseline cognitive functioning                                 General Comments: pt orientated to self, attempts to follow commands however has increased difficulty, poor processing and requires multimodal cues  General Comments      Exercises     Assessment/Plan    PT Assessment Patient needs continued PT services  PT Problem List Decreased strength;Decreased mobility;Decreased activity tolerance;Decreased balance;Decreased knowledge of use of DME;Decreased cognition;Decreased safety awareness       PT Treatment Interventions DME instruction;Therapeutic activities;Gait training;Therapeutic exercise;Patient/family education;Balance training;Functional mobility training    PT Goals (Current goals can be found in the Care Plan section)  Acute Rehab PT Goals PT Goal Formulation:  Patient unable to participate in goal setting Time For Goal Achievement: 01/25/18 Potential to Achieve Goals: Good    Frequency Min 2X/week   Barriers to discharge        Co-evaluation               AM-PAC PT "6 Clicks" Daily Activity  Outcome Measure Difficulty turning over in bed (including adjusting bedclothes, sheets and blankets)?: Unable Difficulty moving from lying on back to sitting on the side of the bed? : Unable Difficulty sitting down on and standing up from a chair with arms (e.g., wheelchair, bedside commode, etc,.)?: Unable Help needed moving to and from a bed to chair (including a wheelchair)?: A Lot Help needed walking in hospital room?: Total Help needed climbing 3-5 steps with a railing? : Total 6 Click Score: 7    End of Session Equipment Utilized During Treatment: Gait belt Activity Tolerance: Patient limited by fatigue Patient left: in bed;with bed alarm set;with call bell/phone within reach Nurse Communication: Mobility status PT Visit Diagnosis: Other abnormalities of gait and mobility (R26.89)    Time: 3810-1751 PT Time Calculation (min) (ACUTE ONLY): 21 min   Charges:   PT Evaluation $PT Eval Low Complexity: 1 Low        Carmelia Bake, PT, DPT 01/18/2018 Pager: [phone]  York Ram E 01/18/2018, 11:51 AM

## 2018-01-18 NOTE — ED Provider Notes (Signed)
Patient was left at change of shift to check results of urine and discharged home with her sister.  However patient was found on the floor after falling getting out of her stretcher.  From what I gather she had an old abrasion on her forehead from 2 falls earlier today, she now has a superficial laceration on the bridge of her nose and a small abrasion on her anterior left knee.  Patient states "I am fine".  C-collar was placed and patient was put back in her bed.  Patient states her legs just give out on her when she stands up.  Sister states the patient's partner has had cancer for times and has recently had a 100 pound weight loss and has difficulty standing more than 3 minutes at a time.  He used to cook for them but now he is unable to cook.  She thinks the patient has not been eating.  He also used to remind her to take her medication which he does not do anymore.  Her sister states she has had about 3 episodes a week for the past several weeks where she has passing out spells.  Let was placed on the laceration on the bridge of her nose.  Dermabond was applied.  CT and x-rays done after her fall in the ED were reviewed and were without acute changes except for a small knee effusion of questionable age.  Her UA is without acute infection.  Will talk to the hospitalist to see if she could be admitted.  At this point patient requires 3 nursing staff to help her walk and I do not feel like the sister is school be able to manage her on her own.  She seems very confused and has difficulty following even simple instructions.  02:23 AM Dr Myna Hidalgo, hospitalist, will admit.   LACERATION REPAIR Performed by: [place] by: Janice Norrie Consent: Verbal consent obtained. Risks and benefits: risks, benefits and alternatives were discussed Consent given by: patient Patient identity confirmed: provided demographic data Prepped and Draped in normal sterile fashion Wound explored  Laceration Location:  bridge of nose  Laceration Length: 0.5 cm  No Foreign Bodies seen or palpated  Anesthesia: Topical  Local anesthetic: LET  Amount of cleaning: standard  Skin closure: dermabond   Patient tolerance: Patient tolerated the procedure well with no immediate complications    Results for orders placed or performed during the hospital encounter of 01/17/18  Urinalysis, Routine w reflex microscopic  Result Value Ref Range   Color, Urine YELLOW YELLOW   APPearance CLEAR CLEAR   Specific Gravity, Urine 1.016 1.005 - 1.030   pH 5.0 5.0 - 8.0   Glucose, UA NEGATIVE NEGATIVE mg/dL   Hgb urine dipstick NEGATIVE NEGATIVE   Bilirubin Urine NEGATIVE NEGATIVE   Ketones, ur NEGATIVE NEGATIVE mg/dL   Protein, ur NEGATIVE NEGATIVE mg/dL   Nitrite NEGATIVE NEGATIVE   Leukocytes, UA NEGATIVE NEGATIVE    Laboratory interpretation normal      Ct Cervical Spine Wo Contrast  Result Date: 01/18/2018 CLINICAL DATA:  72 y/o  F; fall from structure. EXAM: CT CERVICAL SPINE WITHOUT CONTRAST TECHNIQUE: Multidetector CT imaging of the cervical spine was performed without intravenous contrast. Multiplanar CT image reconstructions were also generated. COMPARISON:  01/17/2018 CT of the cervical spine. FINDINGS: Alignment: Moderate cervical spine levocurvature. Straightening of cervical lordosis without listhesis. Skull base and vertebrae: No acute fracture. No primary bone lesion or focal pathologic process. Soft tissues and spinal  canal: No prevertebral fluid or swelling. No visible canal hematoma. Disc levels: Moderate cervical spondylosis with multilevel disc and facet degenerative changes. Uncovertebral and facet hypertrophy results in bony foraminal stenosis on the right at the C3-4, C4-5, and C5-6 levels and on the left at the C5-6 level. No high-grade bony canal stenosis. Upper chest: Negative. Other: Calcific atherosclerosis of carotid siphons. IMPRESSION: 1. No acute fracture or dislocation. 2. Stable  moderate cervical spondylosis. Electronically Signed   By: Kristine Garbe M.D.   On: 01/18/2018 01:48   Dg Knee Complete 4 Views Left  Result Date: 01/18/2018 CLINICAL DATA:  Fall.  Abrasion of anterior left knee EXAM: LEFT KNEE - COMPLETE 4+ VIEW COMPARISON:  None FINDINGS: Early degenerative changes with early spurring. No acute bony abnormality. Specifically, no fracture, subluxation, or dislocation. Small joint effusion. IMPRESSION: Early degenerative changes and small joint effusion. No acute bony abnormality. Electronically Signed   By: Rolm Baptise M.D.   On: 01/18/2018 01:59   Ct Maxillofacial Wo Contrast  Result Date: 01/18/2018 CLINICAL DATA:  72 y/o  F; dizziness and recent falls. EXAM: CT MAXILLOFACIAL WITHOUT CONTRAST TECHNIQUE: Multidetector CT imaging of the maxillofacial structures was performed. Multiplanar CT image reconstructions were also generated. COMPARISON:  01/17/2018 CT head. FINDINGS: Osseous: No fracture or mandibular dislocation. No destructive process. Orbits: Negative. No traumatic or inflammatory finding. Sinuses: Clear. Incidental high-riding right jugular bulb with thin sigmoid plate. Soft tissues: Small left frontal scalp contusion is stable. Soft tissue swelling over the left anterior chain send of the upper lip, possibly a contusion. 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 Limited intracranial: No significant or unexpected finding. IMPRESSION: 1. Small left frontal scalp contusion and swelling of the left anterior chin and upper lip. 2. No acute facial fracture or mandibular dislocation identified. Electronically Signed   By: Kristine Garbe M.D.   On: 01/18/2018 01:43   Diagnoses that have been ruled out:  None  Diagnoses that are still under consideration:  None  Final diagnoses:  Injury  of head, initial encounter  Fall, initial encounter  Chronic fatigue and malaise  Dehydration  Confusion  Weakness  Laceration of nose, initial encounter   Plan admission  Rolland Porter, MD, Barbette Or, MD 01/18/18 [phone]

## 2018-01-18 NOTE — H&P (Signed)
History and Physical    MAURIANA DANN OJJ:[id number] DOB: 04-Jan-1946 DOA: 01/17/2018  PCP: Marletta Lor, MD   Patient coming from: Home   Chief Complaint: Confusion, recurrent falls   HPI: Rebecca Novak is a 72 y.o. female with medical history significant for bipolar disorder and hypothyroidism, now presenting to the emergency department for evaluation of increased confusion and recurrent falls.  Patient lives with a significant other who had previously been cooking for her and assisting with her medications, but has unfortunately been doing poorly with his cancer and has been unable to cook or manage her medications in several weeks.  Family became aware that she had not been taking her medications for several weeks and she resumed them at her family's direction approximately 1-1/2 weeks ago.  She has had progressive worsening confusion and off-balance gait with recurrent falls, having hit her head at least once.  Patient has not expressed any specific complaints.  At her baseline, she is able to ambulate unassisted, feed herself, and dress herself.  Her sister reports that she has been persistently confused and unsteady on her feet, and with disorientation that waxes and wanes some.  They suspect that she has not taken her medications correctly.  She is not known to use alcohol or illicit substances.  She was recently evaluated by her PCP with a normal TSH and ESR, but has continued to worsen since then.  ED Course: Upon arrival to the ED, patient is found to be afebrile, saturating well on room air, and with vitals otherwise stable.  Chemistry panel and CBC are unremarkable and urinalysis appears normal.  Noncontrast head CT is negative for acute intracranial abnormality.  Cervical spine CT is negative for acute fracture or dislocation.  Maxillofacial CT reveals small left frontal scalp contusion and swelling involving the chin and upper lip without acute fracture.  Radiographs of  the left knee are negative for fracture or dislocation.  Patient was given a liter of normal saline in the ED.  She continues to be confused and had fallen in the emergency department prior to the imaging.  She will be observed in the hospital for ongoing evaluation and management of acute encephalopathy with frequent falls.  Review of Systems:  All other systems reviewed and apart from HPI, are negative.  Past Medical History:  Diagnosis Date  . ANEMIA, MILD 05/17/2008  . COLONIC POLYPS, HX OF 05/17/2007  . Depression    bipolar  . GLUCOSE INTOLERANCE 11/23/2007  . HAND PAIN 07/31/2008  . HYPERLIPIDEMIA 02/05/2007  . HYPOTHYROIDISM 02/05/2007  . Skin cancer    "scraped off chest and RLE" (02/15/2013)  . SYSTOLIC MURMUR 93/71/6967    Past Surgical History:  Procedure Laterality Date  . APPENDECTOMY    . TONSILLECTOMY    . VAGINAL HYSTERECTOMY       reports that she has quit smoking. Her smoking use included cigarettes. She has a 14.52 pack-year smoking history. She has never used smokeless tobacco. She reports that she does not drink alcohol or use drugs.  No Known Allergies  Family History  Problem Relation Age of Onset  . Dementia Mother   . Healthy Father        until death at age 35  . Mental illness Brother   . Healthy Sister      Prior to Admission medications   Medication Sig Start Date End Date Taking? Authorizing Provider  Carbamazepine (EQUETRO) 300 MG CP12 Take 600 mg by mouth 2 (two)  times daily.    Yes [provider]  levothyroxine (SYNTHROID, LEVOTHROID) 100 MCG tablet Take 1 tablet (100 mcg total) by mouth daily. 07/09/17  Yes Marletta Lor, MD  Lurasidone HCl (LATUDA) 120 MG TABS Take 120 mg by mouth every evening.    Yes [provider]  metoprolol tartrate (LOPRESSOR) 25 MG tablet Take 1 tablet (25 mg total) by mouth daily as needed (palpitations). 12/02/17  Yes Marletta Lor, MD  QUEtiapine (SEROQUEL XR) 200 MG 24 hr tablet  Take 200 mg by mouth at bedtime.   Yes [provider]  SF 5000 PLUS 1.1 % CREA dental cream Place 1 application onto teeth at bedtime.  10/30/11  Yes [provider]  simvastatin (ZOCOR) 40 MG tablet TAKE 1 TABLET BY MOUTH AT BEDTIME Patient taking differently: Take 40 mg by mouth every evening.  10/20/17  Yes Marletta Lor, MD    Physical Exam: Vitals:   01/17/18 2038 01/17/18 2258 01/18/18 0028 01/18/18 0100  BP:  (!) 171/85 (!) 153/83 (!) 161/87  Pulse:  88 90 90  Resp:  17 17 (!) 23  Temp:      TempSrc:      SpO2:  96% 98% 98%  Weight: 89.1 kg     Height: '5\' 8"'  (1.727 m)         Constitutional: NAD, calm  Eyes: PERTLA, lids and conjunctivae normal ENMT: Mucous membranes are moist. Posterior pharynx clear of any exudate or lesions.   Neck: normal, supple, no masses, no thyromegaly Respiratory: clear to auscultation bilaterally, no wheezing, no crackles. Normal respiratory effort.    Cardiovascular: S1 & S2 heard, regular rate and rhythm. No extremity edema.   Abdomen: No distension, no tenderness, soft. Bowel sounds normal.  Musculoskeletal: no clubbing / cyanosis. No joint deformity upper and lower extremities.    Skin: Ecchymoses and swelling about the face and head. Warm, dry, well-perfused. Neurologic: CN 2-12 grossly intact. Sensation intact, patellar DTRs normal. Strength 5/5 in all 4 limbs.  Psychiatric: Alert and oriented to person and place, but not situation or time. Calm, cooperative.     Labs on Admission: I have personally reviewed following labs and imaging studies  CBC: Recent Labs  Lab 01/17/18 2214  WBC 9.7  NEUTROABS 9.0*  HGB 12.3  HCT 37.6  MCV 88.1  PLT 656   Basic Metabolic Panel: Recent Labs  Lab 01/17/18 2214  NA 143  K 4.0  CL 107  CO2 25  GLUCOSE 138*  BUN 26*  CREATININE 0.84  CALCIUM 9.2   GFR: Estimated Creatinine Clearance: 68.6 mL/min (by C-G formula based on SCr of 0.84 mg/dL). Liver Function  Tests: No results for input(s): AST, ALT, ALKPHOS, BILITOT, PROT, ALBUMIN in the last 168 hours. No results for input(s): LIPASE, AMYLASE in the last 168 hours. No results for input(s): AMMONIA in the last 168 hours. Coagulation Profile: No results for input(s): INR, PROTIME in the last 168 hours. Cardiac Enzymes: No results for input(s): CKTOTAL, CKMB, CKMBINDEX, TROPONINI in the last 168 hours. BNP (last 3 results) No results for input(s): PROBNP in the last 8760 hours. HbA1C: No results for input(s): HGBA1C in the last 72 hours. CBG: No results for input(s): GLUCAP in the last 168 hours. Lipid Profile: No results for input(s): CHOL, HDL, LDLCALC, TRIG, CHOLHDL, LDLDIRECT in the last 72 hours. Thyroid Function Tests: No results for input(s): TSH, T4TOTAL, FREET4, T3FREE, THYROIDAB in the last 72 hours. Anemia Panel: No results for  input(s): VITAMINB12, FOLATE, FERRITIN, TIBC, IRON, RETICCTPCT in the last 72 hours. Urine analysis:    Component Value Date/Time   COLORURINE YELLOW 01/17/2018 2214   APPEARANCEUR CLEAR 01/17/2018 2214   LABSPEC 1.016 01/17/2018 2214   PHURINE 5.0 01/17/2018 2214   GLUCOSEU NEGATIVE 01/17/2018 2214   HGBUR NEGATIVE 01/17/2018 2214   HGBUR negative 11/01/2008 0856   BILIRUBINUR NEGATIVE 01/17/2018 2214   BILIRUBINUR n 02/02/2015 1425   KETONESUR NEGATIVE 01/17/2018 2214   PROTEINUR NEGATIVE 01/17/2018 2214   UROBILINOGEN 0.2 02/02/2015 1425   UROBILINOGEN 1.0 02/14/2013 1824   NITRITE NEGATIVE 01/17/2018 2214   LEUKOCYTESUR NEGATIVE 01/17/2018 2214   Sepsis Labs: '@LABRCNTIP' (procalcitonin:4,lacticidven:4) )No results found for this or any previous visit (from the past 240 hour(s)).   Radiological Exams on Admission: Dg Chest 2 View  Result Date: 01/17/2018 CLINICAL DATA:  Fall EXAM: CHEST - 2 VIEW COMPARISON:  12/12/2017 FINDINGS: Heart is normal size. No confluent airspace opacities or effusions. No acute bony abnormality or pneumothorax.  IMPRESSION: No acute cardiopulmonary disease. Electronically Signed   By: Rolm Baptise M.D.   On: 01/17/2018 22:38   Ct Head Wo Contrast  Result Date: 01/17/2018 CLINICAL DATA:  Dizziness, fall. EXAM: CT HEAD WITHOUT CONTRAST CT CERVICAL SPINE WITHOUT CONTRAST TECHNIQUE: Multidetector CT imaging of the head and cervical spine was performed following the standard protocol without intravenous contrast. Multiplanar CT image reconstructions of the cervical spine were also generated. COMPARISON:  CT head 02/14/2013 FINDINGS: CT HEAD FINDINGS Brain: There is atrophy and chronic small vessel disease changes. No acute intracranial abnormality. Specifically, no hemorrhage, hydrocephalus, mass lesion, acute infarction, or significant intracranial injury. Vascular: No hyperdense vessel or unexpected calcification. Skull: No acute calvarial abnormality. Sinuses/Orbits: Visualized paranasal sinuses and mastoids clear. Orbital soft tissues unremarkable. Other: None CT CERVICAL SPINE FINDINGS Alignment: No subluxation.  Loss of cervical lordosis. Skull base and vertebrae: No acute fracture. No primary bone lesion or focal pathologic process. Soft tissues and spinal canal: No prevertebral fluid or swelling. No visible canal hematoma. Disc levels: Diffuse degenerative disc disease and facet disease bilaterally. Upper chest: No acute findings Other: No acute findings IMPRESSION: No acute intracranial abnormality. Atrophy, chronic microvascular disease. Mild degenerative changes diffusely throughout the cervical spine. No acute bony abnormality. Loss of cervical lordosis. Electronically Signed   By: Rolm Baptise M.D.   On: 01/17/2018 23:00   Ct Cervical Spine Wo Contrast  Result Date: 01/18/2018 CLINICAL DATA:  72 y/o  F; fall from structure. EXAM: CT CERVICAL SPINE WITHOUT CONTRAST TECHNIQUE: Multidetector CT imaging of the cervical spine was performed without intravenous contrast. Multiplanar CT image reconstructions were  also generated. COMPARISON:  01/17/2018 CT of the cervical spine. FINDINGS: Alignment: Moderate cervical spine levocurvature. Straightening of cervical lordosis without listhesis. Skull base and vertebrae: No acute fracture. No primary bone lesion or focal pathologic process. Soft tissues and spinal canal: No prevertebral fluid or swelling. No visible canal hematoma. Disc levels: Moderate cervical spondylosis with multilevel disc and facet degenerative changes. Uncovertebral and facet hypertrophy results in bony foraminal stenosis on the right at the C3-4, C4-5, and C5-6 levels and on the left at the C5-6 level. No high-grade bony canal stenosis. Upper chest: Negative. Other: Calcific atherosclerosis of carotid siphons. IMPRESSION: 1. No acute fracture or dislocation. 2. Stable moderate cervical spondylosis. Electronically Signed   By: Kristine Garbe M.D.   On: 01/18/2018 01:48   Ct Cervical Spine Wo Contrast  Result Date: 01/17/2018 CLINICAL DATA:  Dizziness, fall. EXAM: CT  HEAD WITHOUT CONTRAST CT CERVICAL SPINE WITHOUT CONTRAST TECHNIQUE: Multidetector CT imaging of the head and cervical spine was performed following the standard protocol without intravenous contrast. Multiplanar CT image reconstructions of the cervical spine were also generated. COMPARISON:  CT head 02/14/2013 FINDINGS: CT HEAD FINDINGS Brain: There is atrophy and chronic small vessel disease changes. No acute intracranial abnormality. Specifically, no hemorrhage, hydrocephalus, mass lesion, acute infarction, or significant intracranial injury. Vascular: No hyperdense vessel or unexpected calcification. Skull: No acute calvarial abnormality. Sinuses/Orbits: Visualized paranasal sinuses and mastoids clear. Orbital soft tissues unremarkable. Other: None CT CERVICAL SPINE FINDINGS Alignment: No subluxation.  Loss of cervical lordosis. Skull base and vertebrae: No acute fracture. No primary bone lesion or focal pathologic process. Soft  tissues and spinal canal: No prevertebral fluid or swelling. No visible canal hematoma. Disc levels: Diffuse degenerative disc disease and facet disease bilaterally. Upper chest: No acute findings Other: No acute findings IMPRESSION: No acute intracranial abnormality. Atrophy, chronic microvascular disease. Mild degenerative changes diffusely throughout the cervical spine. No acute bony abnormality. Loss of cervical lordosis. Electronically Signed   By: Rolm Baptise M.D.   On: 01/17/2018 23:00   Dg Knee Complete 4 Views Left  Result Date: 01/18/2018 CLINICAL DATA:  Fall.  Abrasion of anterior left knee EXAM: LEFT KNEE - COMPLETE 4+ VIEW COMPARISON:  None FINDINGS: Early degenerative changes with early spurring. No acute bony abnormality. Specifically, no fracture, subluxation, or dislocation. Small joint effusion. IMPRESSION: Early degenerative changes and small joint effusion. No acute bony abnormality. Electronically Signed   By: Rolm Baptise M.D.   On: 01/18/2018 01:59   Ct Maxillofacial Wo Contrast  Result Date: 01/18/2018 CLINICAL DATA:  72 y/o  F; dizziness and recent falls. EXAM: CT MAXILLOFACIAL WITHOUT CONTRAST TECHNIQUE: Multidetector CT imaging of the maxillofacial structures was performed. Multiplanar CT image reconstructions were also generated. COMPARISON:  01/17/2018 CT head. FINDINGS: Osseous: No fracture or mandibular dislocation. No destructive process. Orbits: Negative. No traumatic or inflammatory finding. Sinuses: Clear. Incidental high-riding right jugular bulb with thin sigmoid plate. Soft tissues: Small left frontal scalp contusion is stable. Soft tissue swelling over the left anterior chain send of the upper lip, possibly a contusion. 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 Limited intracranial: No significant or unexpected  finding. IMPRESSION: 1. Small left frontal scalp contusion and swelling of the left anterior chin and upper lip. 2. No acute facial fracture or mandibular dislocation identified. Electronically Signed   By: Kristine Garbe M.D.   On: 01/18/2018 01:43    EKG: Not performed.   Assessment/Plan   1. Acute encephalopathy  - Presents with increased confusion and recurrent falls  - At baseline, patient able to ambulate unassisted, feed, and dress self, but has been unsteady on feet with recurrent falls, has waxing and waning disorientation, and unable to reliably feed or dress self  - Exam is non-focal; head CT with no acute intracranial abnormality, CT maxillofacial and c-spine with no acute fracture or dislocation - Basic labs unremarkable, UA normal, TSH wnl earlier this month  - Family suspects her current condition is a result of medication mismanagement  - Check ammonia, RPR, B12, folate, and ammonia levels - Continue supportive care, fall precautions    2. Bipolar disorder  - Patient had not been taking her medications for several weeks, resumed when family became aware ~1.5 wks ago  - Continue  Latuda, carbamazepine, and Seroquel  3. Hypothyroidism  - TSH was normal two weeks ago  - Continue Synthroid     DVT prophylaxis: SCD's  Code Status: Full  Family Communication: Sister updated at bedside Consults called: None Admission status: Observation     Vianne Bulls, MD [facility] Pager [phone]  If 7PM-7AM, please contact night-coverage [email or web address] Password [email or web address]  01/18/2018, 2:45 AM

## 2018-01-18 NOTE — Progress Notes (Signed)
CRITICAL VALUE ALERT  Critical Value:  Tegretol level 15.7  Date & Time Notied:  01/18/2018 1446  Provider Notified: Horris Latino  Orders Received/Actions taken: MD paged.

## 2018-01-18 NOTE — Telephone Encounter (Signed)
This determination will be made by the hospitalist in charge of patient's care

## 2018-01-18 NOTE — Telephone Encounter (Signed)
[name] was given updated info for verbal orders and verbalized understanding. [name] also stated that Georga's Rx was questioned by doctor and nurse as to why it is PRN. They stated that the Rx taking PRM can cause some confusion per [name]. She is also worried about Cyndia Bent and wants to get him some HH. [name] stated that [name] had injured his arm and may need to be seen.  Please advise

## 2018-01-18 NOTE — ED Notes (Signed)
ED TO INPATIENT HANDOFF REPORT  Name/Age/Gender Rebecca Novak 72 y.o. female  Code Status    Code Status Orders  (From admission, onward)         Start     Ordered   01/18/18 0242  Full code  Continuous     01/18/18 0244        Code Status History    Date Active Date Inactive Code Status Order ID Comments User Context   02/15/2013 1106 02/16/2013 1649 Full Code [id number]  Berle Mull, MD Inpatient    Advance Directive Documentation     Most Recent Value  Type of Advance Directive  Healthcare Power of Attorney  Pre-existing out of facility DNR order (yellow form or pink MOST form)  -  "MOST" Form in Place?  -      Home/SNF/Other Home  Chief Complaint psych eval; fall  Level of Care/Admitting Diagnosis ED Disposition    ED Disposition Condition [place]: [facility] [[id number]]  Level of Care: Med-Surg [16]  Diagnosis: Acute encephalopathy [643329]  Admitting Physician: Vianne Bulls [[id number]]  Attending Physician: Vianne Bulls [[id number]]  PT Class (Do Not Modify): Observation [104]  PT Acc Code (Do Not Modify): Observation [10022]       Medical History Past Medical History:  Diagnosis Date  . ANEMIA, MILD 05/17/2008  . COLONIC POLYPS, HX OF 05/17/2007  . Depression    bipolar  . GLUCOSE INTOLERANCE 11/23/2007  . HAND PAIN 07/31/2008  . HYPERLIPIDEMIA 02/05/2007  . HYPOTHYROIDISM 02/05/2007  . Skin cancer    "scraped off chest and RLE" (02/15/2013)  . SYSTOLIC MURMUR 06/03/3233    Allergies No Known Allergies  IV Location/Drains/Wounds Patient Lines/Drains/Airways Status   Active Line/Drains/Airways    Name:   Placement date:   Placement time:   Site:   Days:   Peripheral IV 01/17/18 Right Antecubital   01/17/18    2254    Antecubital   1   External Urinary Catheter   01/17/18    2213    -   1          Labs/Imaging Results for orders placed or performed during the hospital encounter of 01/17/18  (from the past 48 hour(s))  Basic metabolic panel     Status: Abnormal   Collection Time: 01/17/18 10:14 PM  Result Value Ref Range   Sodium 143 135 - 145 mmol/L   Potassium 4.0 3.5 - 5.1 mmol/L   Chloride 107 98 - 111 mmol/L   CO2 25 22 - 32 mmol/L   Glucose, Bld 138 (H) 70 - 99 mg/dL   BUN 26 (H) 8 - 23 mg/dL   Creatinine, Ser 0.84 0.44 - 1.00 mg/dL   Calcium 9.2 8.9 - 10.3 mg/dL   GFR calc non Af Amer >60 >60 mL/min   GFR calc Af Amer >60 >60 mL/min    Comment: (NOTE) The eGFR has been calculated using the CKD EPI equation. This calculation has not been validated in all clinical situations. eGFR's persistently <60 mL/min signify possible Chronic Kidney Disease.    Anion gap 11 5 - 15    Comment: Performed at [facility], [place] [address], [place], [place] [address]  CBC with Differential     Status: Abnormal   Collection Time: 01/17/18 10:14 PM  Result Value Ref Range   WBC 9.7 4.0 - 10.5 K/uL   RBC 4.27 3.87 - 5.11 MIL/uL   Hemoglobin  12.3 12.0 - 15.0 g/dL   HCT 37.6 36.0 - 46.0 %   MCV 88.1 78.0 - 100.0 fL   MCH 28.8 26.0 - 34.0 pg   MCHC 32.7 30.0 - 36.0 g/dL   RDW 13.8 11.5 - 15.5 %   Platelets 231 150 - 400 K/uL   Neutrophils Relative % 93 %   Neutro Abs 9.0 (H) 1.7 - 7.7 K/uL   Lymphocytes Relative 4 %   Lymphs Abs 0.4 (L) 0.7 - 4.0 K/uL   Monocytes Relative 3 %   Monocytes Absolute 0.3 0.1 - 1.0 K/uL   Eosinophils Relative 0 %   Eosinophils Absolute 0.0 0.0 - 0.7 K/uL   Basophils Relative 0 %   Basophils Absolute 0.0 0.0 - 0.1 K/uL    Comment: Performed at [facility], [place] [address] [place], [place] [address]  Urinalysis, Routine w reflex microscopic     Status: None   Collection Time: 01/17/18 10:14 PM  Result Value Ref Range   Color, Urine YELLOW YELLOW   APPearance CLEAR CLEAR   Specific Gravity, Urine 1.016 1.005 - 1.030   pH 5.0 5.0 - 8.0   Glucose, UA NEGATIVE NEGATIVE mg/dL   Hgb urine dipstick NEGATIVE  NEGATIVE   Bilirubin Urine NEGATIVE NEGATIVE   Ketones, ur NEGATIVE NEGATIVE mg/dL   Protein, ur NEGATIVE NEGATIVE mg/dL   Nitrite NEGATIVE NEGATIVE   Leukocytes, UA NEGATIVE NEGATIVE    Comment: Performed at [facility], [place] [address], [place], [place] [address]   Dg Chest 2 View  Result Date: 01/17/2018 CLINICAL DATA:  Fall EXAM: CHEST - 2 VIEW COMPARISON:  12/12/2017 FINDINGS: Heart is normal size. No confluent airspace opacities or effusions. No acute bony abnormality or pneumothorax. IMPRESSION: No acute cardiopulmonary disease. Electronically Signed   By: Rolm Baptise M.D.   On: 01/17/2018 22:38   Ct Head Wo Contrast  Result Date: 01/17/2018 CLINICAL DATA:  Dizziness, fall. EXAM: CT HEAD WITHOUT CONTRAST CT CERVICAL SPINE WITHOUT CONTRAST TECHNIQUE: Multidetector CT imaging of the head and cervical spine was performed following the standard protocol without intravenous contrast. Multiplanar CT image reconstructions of the cervical spine were also generated. COMPARISON:  CT head 02/14/2013 FINDINGS: CT HEAD FINDINGS Brain: There is atrophy and chronic small vessel disease changes. No acute intracranial abnormality. Specifically, no hemorrhage, hydrocephalus, mass lesion, acute infarction, or significant intracranial injury. Vascular: No hyperdense vessel or unexpected calcification. Skull: No acute calvarial abnormality. Sinuses/Orbits: Visualized paranasal sinuses and mastoids clear. Orbital soft tissues unremarkable. Other: None CT CERVICAL SPINE FINDINGS Alignment: No subluxation.  Loss of cervical lordosis. Skull base and vertebrae: No acute fracture. No primary bone lesion or focal pathologic process. Soft tissues and spinal canal: No prevertebral fluid or swelling. No visible canal hematoma. Disc levels: Diffuse degenerative disc disease and facet disease bilaterally. Upper chest: No acute findings Other: No acute findings IMPRESSION: No acute intracranial abnormality.  Atrophy, chronic microvascular disease. Mild degenerative changes diffusely throughout the cervical spine. No acute bony abnormality. Loss of cervical lordosis. Electronically Signed   By: Rolm Baptise M.D.   On: 01/17/2018 23:00   Ct Cervical Spine Wo Contrast  Result Date: 01/18/2018 CLINICAL DATA:  72 y/o  F; fall from structure. EXAM: CT CERVICAL SPINE WITHOUT CONTRAST TECHNIQUE: Multidetector CT imaging of the cervical spine was performed without intravenous contrast. Multiplanar CT image reconstructions were also generated. COMPARISON:  01/17/2018 CT of the cervical spine. FINDINGS: Alignment: Moderate cervical spine levocurvature. Straightening of cervical lordosis  without listhesis. Skull base and vertebrae: No acute fracture. No primary bone lesion or focal pathologic process. Soft tissues and spinal canal: No prevertebral fluid or swelling. No visible canal hematoma. Disc levels: Moderate cervical spondylosis with multilevel disc and facet degenerative changes. Uncovertebral and facet hypertrophy results in bony foraminal stenosis on the right at the C3-4, C4-5, and C5-6 levels and on the left at the C5-6 level. No high-grade bony canal stenosis. Upper chest: Negative. Other: Calcific atherosclerosis of carotid siphons. IMPRESSION: 1. No acute fracture or dislocation. 2. Stable moderate cervical spondylosis. Electronically Signed   By: Kristine Garbe M.D.   On: 01/18/2018 01:48   Ct Cervical Spine Wo Contrast  Result Date: 01/17/2018 CLINICAL DATA:  Dizziness, fall. EXAM: CT HEAD WITHOUT CONTRAST CT CERVICAL SPINE WITHOUT CONTRAST TECHNIQUE: Multidetector CT imaging of the head and cervical spine was performed following the standard protocol without intravenous contrast. Multiplanar CT image reconstructions of the cervical spine were also generated. COMPARISON:  CT head 02/14/2013 FINDINGS: CT HEAD FINDINGS Brain: There is atrophy and chronic small vessel disease changes. No acute  intracranial abnormality. Specifically, no hemorrhage, hydrocephalus, mass lesion, acute infarction, or significant intracranial injury. Vascular: No hyperdense vessel or unexpected calcification. Skull: No acute calvarial abnormality. Sinuses/Orbits: Visualized paranasal sinuses and mastoids clear. Orbital soft tissues unremarkable. Other: None CT CERVICAL SPINE FINDINGS Alignment: No subluxation.  Loss of cervical lordosis. Skull base and vertebrae: No acute fracture. No primary bone lesion or focal pathologic process. Soft tissues and spinal canal: No prevertebral fluid or swelling. No visible canal hematoma. Disc levels: Diffuse degenerative disc disease and facet disease bilaterally. Upper chest: No acute findings Other: No acute findings IMPRESSION: No acute intracranial abnormality. Atrophy, chronic microvascular disease. Mild degenerative changes diffusely throughout the cervical spine. No acute bony abnormality. Loss of cervical lordosis. Electronically Signed   By: Rolm Baptise M.D.   On: 01/17/2018 23:00   Dg Knee Complete 4 Views Left  Result Date: 01/18/2018 CLINICAL DATA:  Fall.  Abrasion of anterior left knee EXAM: LEFT KNEE - COMPLETE 4+ VIEW COMPARISON:  None FINDINGS: Early degenerative changes with early spurring. No acute bony abnormality. Specifically, no fracture, subluxation, or dislocation. Small joint effusion. IMPRESSION: Early degenerative changes and small joint effusion. No acute bony abnormality. Electronically Signed   By: Rolm Baptise M.D.   On: 01/18/2018 01:59   Ct Maxillofacial Wo Contrast  Result Date: 01/18/2018 CLINICAL DATA:  72 y/o  F; dizziness and recent falls. EXAM: CT MAXILLOFACIAL WITHOUT CONTRAST TECHNIQUE: Multidetector CT imaging of the maxillofacial structures was performed. Multiplanar CT image reconstructions were also generated. COMPARISON:  01/17/2018 CT head. FINDINGS: Osseous: No fracture or mandibular dislocation. No destructive process. Orbits:  Negative. No traumatic or inflammatory finding. Sinuses: Clear. Incidental high-riding right jugular bulb with thin sigmoid plate. Soft tissues: Small left frontal scalp contusion is stable. Soft tissue swelling over the left anterior chain send of the upper lip, possibly a contusion. 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 Limited intracranial: No significant or unexpected finding. IMPRESSION: 1. Small left frontal scalp contusion and swelling of the left anterior chin and upper lip. 2. No acute facial fracture or mandibular dislocation identified. Electronically Signed   By: Kristine Garbe M.D.   On: 01/18/2018 01:43    Pending Labs [employer] (From admission, onward)    Start     Ordered  01/18/18 6269  Basic metabolic panel  Tomorrow morning,   R     01/18/18 0244   01/18/18 0500  Vitamin B12  Tomorrow morning,   R     01/18/18 0244   01/18/18 0500  Folate RBC  Tomorrow morning,   R     01/18/18 0244   01/18/18 0500  RPR  Tomorrow morning,   R     01/18/18 0244   01/18/18 0500  HIV antibody  Tomorrow morning,   R     01/18/18 0244   01/18/18 0500  Ammonia  Tomorrow morning,   R     01/18/18 0244          Vitals/Pain Today's Vitals   01/17/18 2258 01/18/18 0028 01/18/18 0058 01/18/18 0100  BP: (!) 171/85 (!) 153/83  (!) 161/87  Pulse: 88 90  90  Resp: 17 17  (!) 23  Temp:      TempSrc:      SpO2: 96% 98%  98%  Weight:      Height:      PainSc:   0-No pain     Isolation Precautions No active isolations  Medications Medications  simvastatin (ZOCOR) tablet 40 mg (has no administration in time range)  Carbamazepine (Equetro) CP12 600 mg (has no administration in time range)  Lurasidone HCl TABS 120 mg (has no administration in time range)  QUEtiapine (SEROQUEL XR) 24 hr tablet 200 mg (has no  administration in time range)  levothyroxine (SYNTHROID, LEVOTHROID) tablet 75 mcg (has no administration in time range)  sodium fluoride (PREVIDENT 5000 PLUS) 1.1 % dental cream 1 application (has no administration in time range)  0.9 %  sodium chloride infusion (has no administration in time range)  acetaminophen (TYLENOL) tablet 650 mg (has no administration in time range)    Or  acetaminophen (TYLENOL) suppository 650 mg (has no administration in time range)  senna-docusate (Senokot-S) tablet 1 tablet (has no administration in time range)  ondansetron (ZOFRAN) tablet 4 mg (has no administration in time range)    Or  ondansetron (ZOFRAN) injection 4 mg (has no administration in time range)  sodium chloride 0.9 % bolus 1,000 mL (0 mLs Intravenous Stopped 01/18/18 0144)  lidocaine-EPINEPHrine-tetracaine (LET) solution (3 mLs Topical Given 01/18/18 0135)    Mobility walks

## 2018-01-18 NOTE — ED Notes (Signed)
When walking into the room to perform an In and Out cath, pt was found on floor with a puddle of blood beside of her. Pt was examined. Pt states she did not lose consciousness. Pt denies any pain at this time. Pt had pure-wick in place. When asked why she tried to get out of bed, pt states she wanted to surprise her sister with her urine.

## 2018-01-18 NOTE — Telephone Encounter (Signed)
Copied from [place]. Topic: Quick Communication - See Telephone Encounter >> Jan 18, 2018 12:04 PM Burchel, Abbi R wrote: CRM for notification. See Telephone encounter for: 01/18/18.  Pt's sister, [name], is requesting a referral to rehab after her d/c from hospital.  Please advise.  [name]: [phone]

## 2018-01-18 NOTE — Progress Notes (Signed)
PROGRESS NOTE  Rebecca CARL IEP:[id number] DOB: 07/24/1945 DOA: 01/17/2018 PCP: Marletta Lor, MD  HPI/Recap of past 24 hours: Rebecca Novak is a 72 y.o. female with medical history significant for bipolar disorder and hypothyroidism, presenting to the ED for evaluation for worsening confusion and recurrent falls, including hitting her head. At her baseline, she is able to ambulate unassisted, feed herself, and dress herself.  Her sister reports that she has been persistently confused and unsteady on her feet, and with disorientation that waxes and wanes. In the ED, CT head and C-spine negative for acute intracranial abnormality. Maxillofacial CT reveals small left frontal scalp contusion and swelling involving the chin and upper lip without acute fracture. Pt admitted for further management.   Today, met pt laying in bed, denies any new complaints. Reports she is at [facility] due to her frequent falls.  Assessment/Plan: Principal Problem:   Acute encephalopathy Active Problems:   Hypothyroidism   Recurrent falls   Bipolar disorder (HCC)  Acute metabolic encephalopathy/Frequent falls Afebrile, no leukocytosis Likely due to ?medication non-compliance, Vs dehydration, r/o infection At baseline, patient able to ambulate unassisted, perform basic ADLs, but has been unsteady on feet with recurrent falls, and disorientation as per family Head CT/C-spine with no acute intracranial abnormality, CT maxillofacial with small left frontal scalp hematoma UA, TSH, ammonia, Vit B12 all WNL Folate and RPR pending Check orthostatics Continue IVF, fall precautions, PT/OT  Hypothyroidism  TSH WNL  Continue Synthroid     Bipolar disorder  Stable Carbamazepine level pending Continue Latuda, carbamazepine, and Seroquel    Code Status: Full  Family Communication: None at bedside  Disposition Plan: Once work up  complete   Consultants:  None  Procedures:  None   Antimicrobials:  None  DVT prophylaxis: Lovenox   Objective: Vitals:   01/18/18 0028 01/18/18 0100 01/18/18 0200 01/18/18 0347  BP: (!) 153/83 (!) 161/87 (!) 165/80 (!) 163/87  Pulse: 90 90 89 91  Resp: 17 (!) _0 Temp:    99 F (37.2 C)  TempSrc:    Oral  SpO2: 98% 98% 99% 98%  Weight:      Height:        Intake/Output Summary (Last 24 hours) at 01/18/2018 1117 Last data filed at 01/18/2018 0900 Gross per 24 hour  Intake 2116.71 ml  Output 0 ml  Net 2116.71 ml   Filed Weights   01/17/18 2038  Weight: 89.1 kg    Exam:   General: NAD  Cardiovascular: S1, S2 present  Respiratory: CTAB  Abdomen: Soft, NT, ND, BS present  Musculoskeletal: No pedal edema B/Novak  Skin: Bruising noted around face and head  Psychiatry: Normal mood  Neuro: No focal neurologic deficit noted, strength equal in all extremities    Data Reviewed: CBC: Recent Labs  Lab 01/17/18 2214  WBC 9.7  NEUTROABS 9.0*  HGB 12.3  HCT 37.6  MCV 88.1  PLT 660   Basic Metabolic Panel: Recent Labs  Lab 01/17/18 2214 01/18/18 0549  NA 143 143  K 4.0 3.7  CL 107 109  CO2 25 24  GLUCOSE 138* 129*  BUN 26* 22  CREATININE 0.84 0.83  CALCIUM 9.2 8.7*   GFR: Estimated Creatinine Clearance: 69.5 mL/min (by C-G formula based on SCr of 0.83 mg/dL). Liver Function Tests: No results for input(s): AST, ALT, ALKPHOS, BILITOT, PROT, ALBUMIN in the last 168 hours. No results for input(s): LIPASE, AMYLASE in the last 168 hours. Recent  Labs  Lab 01/18/18 0549  AMMONIA 21   Coagulation Profile: No results for input(s): INR, PROTIME in the last 168 hours. Cardiac Enzymes: No results for input(s): CKTOTAL, CKMB, CKMBINDEX, TROPONINI in the last 168 hours. BNP (last 3 results) No results for input(s): PROBNP in the last 8760 hours. HbA1C: No results for input(s): HGBA1C in the last 72 hours. CBG: No results for input(s):  GLUCAP in the last 168 hours. Lipid Profile: No results for input(s): CHOL, HDL, LDLCALC, TRIG, CHOLHDL, LDLDIRECT in the last 72 hours. Thyroid Function Tests: No results for input(s): TSH, T4TOTAL, FREET4, T3FREE, THYROIDAB in the last 72 hours. Anemia Panel: Recent Labs    01/18/18 0549  VITAMINB12 611   Urine analysis:    Component Value Date/Time   COLORURINE YELLOW 01/17/2018 2214   APPEARANCEUR CLEAR 01/17/2018 2214   LABSPEC 1.016 01/17/2018 2214   PHURINE 5.0 01/17/2018 2214   GLUCOSEU NEGATIVE 01/17/2018 2214   HGBUR NEGATIVE 01/17/2018 2214   HGBUR negative 11/01/2008 0856   BILIRUBINUR NEGATIVE 01/17/2018 2214   BILIRUBINUR n 02/02/2015 1425   KETONESUR NEGATIVE 01/17/2018 2214   PROTEINUR NEGATIVE 01/17/2018 2214   UROBILINOGEN 0.2 02/02/2015 1425   UROBILINOGEN 1.0 02/14/2013 1824   NITRITE NEGATIVE 01/17/2018 2214   LEUKOCYTESUR NEGATIVE 01/17/2018 2214   Sepsis Labs: _0 (procalcitonin:4,lacticidven:4)  )No results found for this or any previous visit (from the past 240 hour(s)).    Studies: Dg Chest 2 View  Result Date: 01/17/2018 CLINICAL DATA:  Fall EXAM: CHEST - 2 VIEW COMPARISON:  12/12/2017 FINDINGS: Heart is normal size. No confluent airspace opacities or effusions. No acute bony abnormality or pneumothorax. IMPRESSION: No acute cardiopulmonary disease. Electronically Signed   By: Rolm Baptise M.D.   On: 01/17/2018 22:38   Ct Head Wo Contrast  Result Date: 01/17/2018 CLINICAL DATA:  Dizziness, fall. EXAM: CT HEAD WITHOUT CONTRAST CT CERVICAL SPINE WITHOUT CONTRAST TECHNIQUE: Multidetector CT imaging of the head and cervical spine was performed following the standard protocol without intravenous contrast. Multiplanar CT image reconstructions of the cervical spine were also generated. COMPARISON:  CT head 02/14/2013 FINDINGS: CT HEAD FINDINGS Brain: There is atrophy and chronic small vessel disease changes. No acute intracranial abnormality.  Specifically, no hemorrhage, hydrocephalus, mass lesion, acute infarction, or significant intracranial injury. Vascular: No hyperdense vessel or unexpected calcification. Skull: No acute calvarial abnormality. Sinuses/Orbits: Visualized paranasal sinuses and mastoids clear. Orbital soft tissues unremarkable. Other: None CT CERVICAL SPINE FINDINGS Alignment: No subluxation.  Loss of cervical lordosis. Skull base and vertebrae: No acute fracture. No primary bone lesion or focal pathologic process. Soft tissues and spinal canal: No prevertebral fluid or swelling. No visible canal hematoma. Disc levels: Diffuse degenerative disc disease and facet disease bilaterally. Upper chest: No acute findings Other: No acute findings IMPRESSION: No acute intracranial abnormality. Atrophy, chronic microvascular disease. Mild degenerative changes diffusely throughout the cervical spine. No acute bony abnormality. Loss of cervical lordosis. Electronically Signed   By: Rolm Baptise M.D.   On: 01/17/2018 23:00   Ct Cervical Spine Wo Contrast  Result Date: 01/18/2018 CLINICAL DATA:  72 y/o  F; fall from structure. EXAM: CT CERVICAL SPINE WITHOUT CONTRAST TECHNIQUE: Multidetector CT imaging of the cervical spine was performed without intravenous contrast. Multiplanar CT image reconstructions were also generated. COMPARISON:  01/17/2018 CT of the cervical spine. FINDINGS: Alignment: Moderate cervical spine levocurvature. Straightening of cervical lordosis without listhesis. Skull base and vertebrae: No acute fracture. No primary bone lesion or focal pathologic process. Soft tissues  and spinal canal: No prevertebral fluid or swelling. No visible canal hematoma. Disc levels: Moderate cervical spondylosis with multilevel disc and facet degenerative changes. Uncovertebral and facet hypertrophy results in bony foraminal stenosis on the right at the C3-4, C4-5, and C5-6 levels and on the left at the C5-6 level. No high-grade bony canal  stenosis. Upper chest: Negative. Other: Calcific atherosclerosis of carotid siphons. IMPRESSION: 1. No acute fracture or dislocation. 2. Stable moderate cervical spondylosis. Electronically Signed   By: Kristine Garbe M.D.   On: 01/18/2018 01:48   Ct Cervical Spine Wo Contrast  Result Date: 01/17/2018 CLINICAL DATA:  Dizziness, fall. EXAM: CT HEAD WITHOUT CONTRAST CT CERVICAL SPINE WITHOUT CONTRAST TECHNIQUE: Multidetector CT imaging of the head and cervical spine was performed following the standard protocol without intravenous contrast. Multiplanar CT image reconstructions of the cervical spine were also generated. COMPARISON:  CT head 02/14/2013 FINDINGS: CT HEAD FINDINGS Brain: There is atrophy and chronic small vessel disease changes. No acute intracranial abnormality. Specifically, no hemorrhage, hydrocephalus, mass lesion, acute infarction, or significant intracranial injury. Vascular: No hyperdense vessel or unexpected calcification. Skull: No acute calvarial abnormality. Sinuses/Orbits: Visualized paranasal sinuses and mastoids clear. Orbital soft tissues unremarkable. Other: None CT CERVICAL SPINE FINDINGS Alignment: No subluxation.  Loss of cervical lordosis. Skull base and vertebrae: No acute fracture. No primary bone lesion or focal pathologic process. Soft tissues and spinal canal: No prevertebral fluid or swelling. No visible canal hematoma. Disc levels: Diffuse degenerative disc disease and facet disease bilaterally. Upper chest: No acute findings Other: No acute findings IMPRESSION: No acute intracranial abnormality. Atrophy, chronic microvascular disease. Mild degenerative changes diffusely throughout the cervical spine. No acute bony abnormality. Loss of cervical lordosis. Electronically Signed   By: Rolm Baptise M.D.   On: 01/17/2018 23:00   Dg Knee Complete 4 Views Left  Result Date: 01/18/2018 CLINICAL DATA:  Fall.  Abrasion of anterior left knee EXAM: LEFT KNEE - COMPLETE 4+  VIEW COMPARISON:  None FINDINGS: Early degenerative changes with early spurring. No acute bony abnormality. Specifically, no fracture, subluxation, or dislocation. Small joint effusion. IMPRESSION: Early degenerative changes and small joint effusion. No acute bony abnormality. Electronically Signed   By: Rolm Baptise M.D.   On: 01/18/2018 01:59   Ct Maxillofacial Wo Contrast  Result Date: 01/18/2018 CLINICAL DATA:  72 y/o  F; dizziness and recent falls. EXAM: CT MAXILLOFACIAL WITHOUT CONTRAST TECHNIQUE: Multidetector CT imaging of the maxillofacial structures was performed. Multiplanar CT image reconstructions were also generated. COMPARISON:  01/17/2018 CT head. FINDINGS: Osseous: No fracture or mandibular dislocation. No destructive process. Orbits: Negative. No traumatic or inflammatory finding. Sinuses: Clear. Incidental high-riding right jugular bulb with thin sigmoid plate. Soft tissues: Small left frontal scalp contusion is stable. Soft tissue swelling over the left anterior chain send of the upper lip, possibly a contusion. 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 Limited intracranial: No significant or unexpected finding. IMPRESSION: 1. Small left frontal scalp contusion and swelling of the left anterior chin and upper lip. 2. No acute facial fracture or mandibular dislocation identified. Electronically Signed   By: Kristine Garbe M.D.   On: 01/18/2018 01:43    Scheduled Meds: . carbamazepine  600 mg Oral BID  . levothyroxine  75 mcg Oral Daily  . lurasidone  120 mg Oral QPM  . QUEtiapine  200 mg Oral QHS  . simvastatin  40 mg Oral QPM  . sodium fluoride  1 application dental QHS    Continuous Infusions: . sodium chloride       LOS: 0 days     Alma Friendly, MD [facility]  If 7PM-7AM, please contact  night-coverage [email or web address] Password [facility] 01/18/2018, 11:17 AM

## 2018-01-18 NOTE — ED Notes (Signed)
Patient transported to CT 

## 2018-01-19 ENCOUNTER — Encounter (HOSPITAL_COMMUNITY): Payer: Self-pay | Admitting: *Deleted

## 2018-01-19 DIAGNOSIS — T421X1A Poisoning by iminostilbenes, accidental (unintentional), initial encounter: Secondary | ICD-10-CM

## 2018-01-19 LAB — CBC WITH DIFFERENTIAL/PLATELET
Basophils Absolute: 0 10*3/uL (ref 0.0–0.1)
Basophils Relative: 0 %
EOS ABS: 0 10*3/uL (ref 0.0–0.7)
Eosinophils Relative: 0 %
HCT: 32.9 % — ABNORMAL LOW (ref 36.0–46.0)
HEMOGLOBIN: 10.9 g/dL — AB (ref 12.0–15.0)
LYMPHS ABS: 2.2 10*3/uL (ref 0.7–4.0)
LYMPHS PCT: 27 %
MCH: 29.1 pg (ref 26.0–34.0)
MCHC: 33.1 g/dL (ref 30.0–36.0)
MCV: 87.7 fL (ref 78.0–100.0)
Monocytes Absolute: 0.8 10*3/uL (ref 0.1–1.0)
Monocytes Relative: 10 %
NEUTROS ABS: 5.2 10*3/uL (ref 1.7–7.7)
Neutrophils Relative %: 63 %
Platelets: 197 10*3/uL (ref 150–400)
RBC: 3.75 MIL/uL — ABNORMAL LOW (ref 3.87–5.11)
RDW: 14.3 % (ref 11.5–15.5)
WBC: 8.2 10*3/uL (ref 4.0–10.5)

## 2018-01-19 LAB — BASIC METABOLIC PANEL
Anion gap: 7 (ref 5–15)
BUN: 17 mg/dL (ref 8–23)
CHLORIDE: 112 mmol/L — AB (ref 98–111)
CO2: 27 mmol/L (ref 22–32)
Calcium: 8.4 mg/dL — ABNORMAL LOW (ref 8.9–10.3)
Creatinine, Ser: 0.77 mg/dL (ref 0.44–1.00)
GFR calc Af Amer: >60 mL/min (ref >60)
GFR calc non Af Amer: >60 mL/min (ref >60)
Glucose, Bld: 104 mg/dL — ABNORMAL HIGH (ref 70–99)
POTASSIUM: 3.4 mmol/L — AB (ref 3.5–5.1)
SODIUM: 146 mmol/L — AB (ref 135–145)

## 2018-01-19 LAB — HIV ANTIBODY (ROUTINE TESTING W REFLEX): HIV SCREEN 4TH GENERATION: NONREACTIVE

## 2018-01-19 LAB — FOLATE RBC
FOLATE, HEMOLYSATE: 475.5 ng/mL
Folate, RBC: 1362 ng/mL (ref >498)
Hematocrit: 34.9 % (ref 34.0–46.6)

## 2018-01-19 LAB — CARBAMAZEPINE LEVEL, TOTAL: CARBAMAZEPINE LVL: 8.1 ug/mL (ref 4.0–12.0)

## 2018-01-19 MED ORDER — CARBAMAZEPINE ER 200 MG PO CP12
400.0000 mg | ORAL_CAPSULE | Freq: Two times a day (BID) | ORAL | Status: DC
  Administered 2018-01-19 – 2018-01-21 (×4): 400 mg via ORAL
  Filled 2018-01-19 (×4): qty 2

## 2018-01-19 MED ORDER — POTASSIUM CHLORIDE CRYS ER 20 MEQ PO TBCR
40.0000 meq | EXTENDED_RELEASE_TABLET | Freq: Once | ORAL | Status: AC
  Administered 2018-01-19: 40 meq via ORAL
  Filled 2018-01-19: qty 2

## 2018-01-19 NOTE — Progress Notes (Addendum)
PROGRESS NOTE  Rebecca Novak HXT:[id number] DOB: April 03, 1946 DOA: 01/17/2018 PCP: Marletta Lor, MD  HPI/Recap of past 24 hours: Rebecca Novak is a 72 y.o. female with medical history significant for bipolar disorder and hypothyroidism, presenting to the ED for evaluation for worsening confusion and recurrent falls, including hitting her head. At her baseline, she is able to ambulate unassisted, feed herself, and dress herself.  Her sister reports that she has been persistently confused and unsteady on her feet, and with disorientation that waxes and wanes. In the ED, CT head and C-spine negative for acute intracranial abnormality. Maxillofacial CT reveals small left frontal scalp contusion and swelling involving the chin and upper lip without acute fracture. Pt admitted for further management.   Today, met pt sleeping, easily arousable, oriented. Reports she is "trying to catch up on sleep". Denies any new complaints.   Assessment/Plan: Principal Problem:   Acute encephalopathy Active Problems:   Hypothyroidism   Recurrent falls   Bipolar disorder (HCC)  Acute metabolic encephalopathy/Frequent falls Afebrile, no leukocytosis Likely due to ?carbamazepine toxicity (could cause ataxia, unsteadiness) Vs dehydration, r/o infection Carbamazepine level on admission elevated at 15.7  At baseline, patient able to ambulate unassisted, perform basic ADLs, but has been unsteady on feet with recurrent falls, and disorientated as per family Head CT/C-spine with no acute intracranial abnormality, CT maxillofacial with small left frontal scalp hematoma UA, TSH, ammonia, Vit B12, folate all WNL RPR non-reactive Spoke to neurology: Recommend adjusting dose and checking daily levels Check orthostatis S/P IVF, fall precautions, PT/OT  Hypothyroidism  TSH WNL  Continue Synthroid     Bipolar disorder  Stable Carbamazepine level elevated at 15.7, med held, repeat level showed 8.1  WNL Restarted carbamazepine at 400 mg BID (home dose 600 mg BID) Daily carbamazepine levels to see if it remains WNL Continue Latuda, Seroquel    Code Status: Full  Family Communication: None at bedside  Disposition Plan: PT recommending SNF, pt will require 3 days of inpatient stay to qualify. PT to reassess and see if she may qualify for [facility]. Otherwise SNF placement   Consultants:  Spoke to neurology  Procedures:  None   Antimicrobials:  None  DVT prophylaxis: Lovenox   Objective: Vitals:   01/18/18 1507 01/18/18 2129 01/19/18 0702 01/19/18 1425  BP: 132/71 (!) 167/76 (!) 145/71 139/71  Pulse: 88 83 75 69  Resp: 14 18 (!) 21 (!) 24  Temp: 97.8 F (36.6 C) 98.6 F (37 C) 98.9 F (37.2 C) 98.8 F (37.1 C)  TempSrc:    Oral  SpO2: 100% 100% 100% 100%  Weight:      Height:        Intake/Output Summary (Last 24 hours) at 01/19/2018 1550 Last data filed at 01/19/2018 0800 Gross per 24 hour  Intake 1499.37 ml  Output 750 ml  Net 749.37 ml   Filed Weights   01/17/18 2038  Weight: 89.1 kg    Exam:   General: NAD  Cardiovascular: S1, S2 present  Respiratory: CTAB  Abdomen: Soft, NT, ND, BS present  Musculoskeletal: No pedal edema B/L  Skin: Bruising noted around face and head  Psychiatry: Normal mood  Neuro: No focal neurologic deficit noted, strength equal in all extremities    Data Reviewed: CBC: Recent Labs  Lab 01/17/18 2214 01/18/18 0549 01/19/18 0557  WBC 9.7  --  8.2  NEUTROABS 9.0*  --  5.2  HGB 12.3  --  10.9*  HCT 37.6 34.9 32.9*  MCV 88.1  --  87.7  PLT 231  --  041   Basic Metabolic Panel: Recent Labs  Lab 01/17/18 2214 01/18/18 0549 01/18/18 1206 01/19/18 0557  NA 143 143  --  146*  K 4.0 3.7  --  3.4*  CL 107 109  --  112*  CO2 25 24  --  27  GLUCOSE 138* 129*  --  104*  BUN 26* 22  --  17  CREATININE 0.84 0.83  --  0.77  CALCIUM 9.2 8.7*  --  8.4*  MG  --   --  1.9  --    GFR: Estimated Creatinine  Clearance: 72.1 mL/min (by C-G formula based on SCr of 0.77 mg/dL). Liver Function Tests: No results for input(s): AST, ALT, ALKPHOS, BILITOT, PROT, ALBUMIN in the last 168 hours. No results for input(s): LIPASE, AMYLASE in the last 168 hours. Recent Labs  Lab 01/18/18 0549  AMMONIA 21   Coagulation Profile: No results for input(s): INR, PROTIME in the last 168 hours. Cardiac Enzymes: No results for input(s): CKTOTAL, CKMB, CKMBINDEX, TROPONINI in the last 168 hours. BNP (last 3 results) No results for input(s): PROBNP in the last 8760 hours. HbA1C: No results for input(s): HGBA1C in the last 72 hours. CBG: No results for input(s): GLUCAP in the last 168 hours. Lipid Profile: No results for input(s): CHOL, HDL, LDLCALC, TRIG, CHOLHDL, LDLDIRECT in the last 72 hours. Thyroid Function Tests: No results for input(s): TSH, T4TOTAL, FREET4, T3FREE, THYROIDAB in the last 72 hours. Anemia Panel: Recent Labs    01/18/18 0549  VITAMINB12 611   Urine analysis:    Component Value Date/Time   COLORURINE YELLOW 01/17/2018 2214   APPEARANCEUR CLEAR 01/17/2018 2214   LABSPEC 1.016 01/17/2018 2214   PHURINE 5.0 01/17/2018 2214   GLUCOSEU NEGATIVE 01/17/2018 2214   HGBUR NEGATIVE 01/17/2018 2214   HGBUR negative 11/01/2008 0856   BILIRUBINUR NEGATIVE 01/17/2018 2214   BILIRUBINUR n 02/02/2015 1425   KETONESUR NEGATIVE 01/17/2018 2214   PROTEINUR NEGATIVE 01/17/2018 2214   UROBILINOGEN 0.2 02/02/2015 1425   UROBILINOGEN 1.0 02/14/2013 1824   NITRITE NEGATIVE 01/17/2018 2214   LEUKOCYTESUR NEGATIVE 01/17/2018 2214   Sepsis Labs: '@LABRCNTIP' (procalcitonin:4,lacticidven:4)  )No results found for this or any previous visit (from the past 240 hour(s)).    Studies: No results found.  Scheduled Meds: . carbamazepine  400 mg Oral BID  . enoxaparin (LOVENOX) injection  40 mg Subcutaneous Q24H  . levothyroxine  75 mcg Oral Daily  . lurasidone  120 mg Oral QPM  . QUEtiapine  200 mg  Oral QHS  . simvastatin  40 mg Oral QPM    Continuous Infusions:    LOS: 1 day     Alma Friendly, MD [facility]  If 7PM-7AM, please contact night-coverage [email or web address] Password [email or web address] 01/19/2018, 3:50 PM

## 2018-01-19 NOTE — Care Management Note (Signed)
Case Management Note  Patient Details  Name: Rebecca Novak MRN: [id number] Date of Birth: 09-27-1945  Subjective/Objective:      Hospital day 2            Minimal (a few hours to 1 day), Brief (1 to 3 days), Moderate (4 to 7 days), and Prolonged (more than 7 days).  Extended stay beyond goal length of stay may be needed for(33)(34):  . ? Syncope-related injuries  Stay extension varies depending on injury.   Action/Plan: Patient is more alert and reactive possible dc [id number]    Expected Discharge Date:                  Expected Discharge Plan:  Home/Self Care  In-House Referral:     Discharge planning Services  CM Consult  Post Acute Care Choice:    Choice offered to:     DME Arranged:    DME Agency:     HH Arranged:    HH Agency:     Status of Service:  In process, will continue to follow  If discussed at Long Length of Stay Meetings, dates discussed:    Additional Comments:  Leeroy Cha, RN 01/19/2018, 11:23 AM

## 2018-01-19 NOTE — NC FL2 (Addendum)
[place] MEDICAID [id number] LEVEL OF CARE SCREENING TOOL     IDENTIFICATION  Patient Name: Rebecca Novak Birthdate: 02/28/1946 Sex: female Admission Date (Current Location): 01/17/2018  [facility] and [place] Number:  Herbalist and Address:  [facility],  [place] [address], [place]      Provider Number: [id number]  Attending Physician Name and Address:  Alma Friendly, MD  Relative Name and Phone Number:       Current Level of Care: Hospital Recommended Level of Care: [place] Prior Approval Number:    Date Approved/Denied:   PASRR Number:  Pending  Discharge Plan: SNF    Current Diagnoses: Patient Active Problem List   Diagnosis Date Noted  . Recurrent falls 01/18/2018  . Acute encephalopathy 01/18/2018  . Bipolar disorder ([place]) 01/18/2018  . Palpitations 12/02/2017  . Syncope 10/26/2015  . Nausea & vomiting 02/15/2013  . Weakness of both legs 02/15/2013  . Orthostatic hypotension 02/15/2013  . HAND PAIN 07/31/2008  . Anemia 05/17/2008  . GLUCOSE INTOLERANCE 11/23/2007  . History of colonic polyps 05/17/2007  . Hypothyroidism 02/05/2007  . Dyslipidemia 02/05/2007    Orientation RESPIRATION BLADDER Height & Weight     Self, Place  Normal Continent, External catheter Weight: 196 lb 6.4 oz (89.1 kg) Height:  5\' 8"  (172.7 cm)  BEHAVIORAL SYMPTOMS/MOOD NEUROLOGICAL BOWEL NUTRITION STATUS      Continent Diet(See dc summary)  AMBULATORY STATUS COMMUNICATION OF NEEDS Skin   Extensive Assist Verbally Bruising(Face)                       Personal Care Assistance Level of Assistance  Bathing, Feeding, Dressing Bathing Assistance: Limited assistance Feeding assistance: Independent Dressing Assistance: Limited assistance     Functional Limitations Info  Sight, Hearing, Speech Sight Info: Impaired Hearing Info: Adequate Speech Info: Adequate    SPECIAL CARE FACTORS FREQUENCY  PT (By licensed PT), OT  (By licensed OT)     PT Frequency: 5x/week OT Frequency: 5x/week            Contractures Contractures Info: Not present    Additional Factors Info  Code Status, Allergies Code Status Info: Full Allergies Info: NKA           Current Medications (01/19/2018):  This is the current hospital active medication list Current Facility-Administered Medications  Medication Dose Route Frequency Provider Last Rate Last Dose  . acetaminophen (TYLENOL) tablet 650 mg  650 mg Oral Q6H PRN Opyd, Ilene Qua, MD       Or  . acetaminophen (TYLENOL) suppository 650 mg  650 mg Rectal Q6H PRN Opyd, Ilene Qua, MD      . carbamazepine (EQUETRO) 12 hr capsule 400 mg  400 mg Oral BID Alma Friendly, MD      . enoxaparin (LOVENOX) injection 40 mg  40 mg Subcutaneous Q24H Alma Friendly, MD      . hydrALAZINE (APRESOLINE) injection 10 mg  10 mg Intravenous Q8H PRN Alma Friendly, MD      . levothyroxine (SYNTHROID, LEVOTHROID) tablet 75 mcg  75 mcg Oral Daily Opyd, Ilene Qua, MD   75 mcg at 01/19/18 0802  . lurasidone (LATUDA) tablet 120 mg  120 mg Oral QPM Opyd, Ilene Qua, MD   120 mg at 01/18/18 1825  . ondansetron (ZOFRAN) tablet 4 mg  4 mg Oral Q6H PRN Opyd, Ilene Qua, MD       Or  . ondansetron (  ZOFRAN) injection 4 mg  4 mg Intravenous Q6H PRN Opyd, Ilene Qua, MD   4 mg at 01/18/18 0859  . QUEtiapine (SEROQUEL XR) 24 hr tablet 200 mg  200 mg Oral QHS Opyd, Ilene Qua, MD   200 mg at 01/18/18 2118  . senna-docusate (Senokot-S) tablet 1 tablet  1 tablet Oral QHS PRN Opyd, Ilene Qua, MD      . simvastatin (ZOCOR) tablet 40 mg  40 mg Oral QPM Opyd, Ilene Qua, MD   40 mg at 01/18/18 1825     Discharge Medications: Please see discharge summary for a list of discharge medications.  Relevant Imaging Results:  Relevant Lab Results:   Additional Information ssn: [id number]  Servando Snare, LCSW

## 2018-01-19 NOTE — Clinical Social Work Note (Signed)
Clinical Social Work Assessment  Patient Details  Name: Rebecca Novak MRN: [id number] Date of Birth: 1945/08/31  Date of referral:  01/18/18               Reason for consult:  Discharge Planning                Permission sought to share information with:  Family Supports, Customer service manager Permission granted to share information::  Yes, Verbal Permission Granted  Name::        Agency::  SNF  Relationship::  Sister/Spouse   Contact Information:     Housing/Transportation Living arrangements for the past 2 months:  Single Family Home Source of Information:  Patient Patient Interpreter Needed:  None Criminal Activity/Legal Involvement Pertinent to Current Situation/Hospitalization:  No - Comment as needed Significant Relationships:  Adult Children Lives with:  Spouse Do you feel safe going back to the place where you live?  Yes Need for family participation in patient care:  Yes (Comment)  Care giving concerns:   Patient admitted for confusion and recurrent falls.  Patient reports she fell in the bathroom and hit her face. Patient reports this is the first time she she has fallen. Patient has bruising on her face and arms. CT scan reveal small left frontal scalp contusion and swelling involving the chin and upper lip without acute fracture.  PT met with patient and recommends SNF placement.   Barriers to SNF placement- CSW explain to patient (at bedside) and sister (by phone), for medicare to approve rehab at SNF the patient will need 3 midnight stay at hospital under inpatient patient class. If the patient discharge before 3 night stay, the patient has the option to private pay for rehab at SNF or go home w/ home health services.   Patient report if she goes to SNF she prefers a facility close to her home.   Social Worker assessment / plan:  CSW discussed discharge planning with patient and her sister. Patient reports she will go to SNF for rehab if it is close to  her home. Patient reports she lives at home with her spouse. She reports she is his primary caregiver. Patient reports she is usually independent with the support of her spouse who helps to also cook and clean. The patient will require additional support and physical therapy to regain her strength.  Plan: SNF  FL2 and PASRR to be completed.   Employment status:  Retired Forensic scientist:  Medicare PT Recommendations:  [place] / Referral to community resources:     Patient/Family's Response to care:  Agreeable to care.   Patient/Family's Understanding of and Emotional Response to Diagnosis, Current Treatment, and Prognosis:  Patient waxes and wanes. Patient sister states the patient spouse also needs assistance.   Emotional Assessment Appearance:  Appears stated age Attitude/Demeanor/Rapport:    Affect (typically observed):  Accepting, Calm Orientation:  Oriented to Self, Oriented to Situation, Oriented to Place Alcohol / Substance use:  Not Applicable Psych involvement (Current and /or in the community):  No (Comment)  Discharge Needs  Concerns to be addressed:  Discharge Planning Concerns Readmission within the last 30 days:  No Current discharge risk:  Dependent with Mobility Barriers to Discharge:  [place], LCSW 01/19/2018, 12:30 PM

## 2018-01-20 ENCOUNTER — Ambulatory Visit: Payer: Medicare Other | Admitting: Internal Medicine

## 2018-01-20 DIAGNOSIS — G9340 Encephalopathy, unspecified: Secondary | ICD-10-CM

## 2018-01-20 DIAGNOSIS — T421X4S Poisoning by iminostilbenes, undetermined, sequela: Secondary | ICD-10-CM

## 2018-01-20 DIAGNOSIS — W19XXXA Unspecified fall, initial encounter: Secondary | ICD-10-CM

## 2018-01-20 HISTORY — DX: Poisoning by iminostilbenes, undetermined, sequela: T42.1X4S

## 2018-01-20 LAB — BASIC METABOLIC PANEL
Anion gap: 5 (ref 5–15)
BUN: 26 mg/dL — AB (ref 8–23)
CHLORIDE: 112 mmol/L — AB (ref 98–111)
CO2: 27 mmol/L (ref 22–32)
CREATININE: 0.77 mg/dL (ref 0.44–1.00)
Calcium: 8.1 mg/dL — ABNORMAL LOW (ref 8.9–10.3)
GFR calc Af Amer: >60 mL/min (ref >60)
GFR calc non Af Amer: >60 mL/min (ref >60)
Glucose, Bld: 104 mg/dL — ABNORMAL HIGH (ref 70–99)
Potassium: 3.3 mmol/L — ABNORMAL LOW (ref 3.5–5.1)
Sodium: 144 mmol/L (ref 135–145)

## 2018-01-20 LAB — CBC WITH DIFFERENTIAL/PLATELET
Basophils Absolute: 0 10*3/uL (ref 0.0–0.1)
Basophils Relative: 0 %
Eosinophils Absolute: 0.2 10*3/uL (ref 0.0–0.7)
Eosinophils Relative: 2 %
HEMATOCRIT: 32.5 % — AB (ref 36.0–46.0)
HEMOGLOBIN: 10.4 g/dL — AB (ref 12.0–15.0)
LYMPHS ABS: 2.2 10*3/uL (ref 0.7–4.0)
LYMPHS PCT: 26 %
MCH: 28.9 pg (ref 26.0–34.0)
MCHC: 32 g/dL (ref 30.0–36.0)
MCV: 90.3 fL (ref 78.0–100.0)
Monocytes Absolute: 0.7 10*3/uL (ref 0.1–1.0)
Monocytes Relative: 8 %
NEUTROS ABS: 5.5 10*3/uL (ref 1.7–7.7)
Neutrophils Relative %: 64 %
Platelets: 173 10*3/uL (ref 150–400)
RBC: 3.6 MIL/uL — ABNORMAL LOW (ref 3.87–5.11)
RDW: 14.1 % (ref 11.5–15.5)
WBC: 8.6 10*3/uL (ref 4.0–10.5)

## 2018-01-20 LAB — CARBAMAZEPINE LEVEL, TOTAL: Carbamazepine Lvl: 7.2 ug/mL (ref 4.0–12.0)

## 2018-01-20 NOTE — Progress Notes (Signed)
Pt stated she wanted sister to be updated.

## 2018-01-20 NOTE — Telephone Encounter (Signed)
Routed to wrong office 

## 2018-01-20 NOTE — Progress Notes (Signed)
[facility] PROGRESS NOTE    Progress Note  Rebecca Novak  NWG:[id number] DOB: [birth year] DOA: 01/17/2018 PCP: Marletta Lor, MD     Brief Narrative:   Rebecca Novak is an 72 y.o. female past medical history of bipolar, hypothyroidism presents to the ED for evaluation of worsening confusion and recurrent falls was found to have carbamazepine level is high.  Assessment/Plan:   Acute encephalopathy likely due to initial encounter of carbamazepine toxicity: CT of the head and spine showed no acute intracranial abnormalities, work-up has been negative. The previous physician spoke with neurology and recommending adjusting her dose of carbamazepine and checking levels daily. Physical therapy evaluation is pending.  Hypothyroidism: Continue Synthroid.  Bipolar disorder ([place]) Carbamazepine levels have improved, she was started on carbamazepine 400 mill grams p.o. twice daily.   DVT prophylaxis: lovenox Family Communication:none Disposition Plan/Barrier to D/C: SNF in am Code Status:     Code Status Orders  (From admission, onward)         Start     Ordered   01/18/18 0242  Full code  Continuous     01/18/18 0244        Code Status History    Date Active Date Inactive Code Status Order ID Comments User Context   02/15/2013 1106 02/16/2013 1649 Full Code [id number]  Berle Mull, MD Inpatient    Advance Directive Documentation     Most Recent Value  Type of Advance Directive  Healthcare Power of Attorney  Pre-existing out of facility DNR order (yellow form or pink MOST form)  -  "MOST" Form in Place?  -        IV Access:    Peripheral IV   Procedures and diagnostic studies:   No results found.   Medical Consultants:    None.  Anti-Infectives:   None  Subjective:    [name] she has no new complaints.  Objective:    Vitals:   01/18/18 2129 01/19/18 0702 01/19/18 1425 01/19/18 2057  BP: (!) 167/76 (!) 145/71  139/71 136/76  Pulse: 83 75 69 72  Resp: 18 (!) 21 (!) 24 18  Temp: 98.6 F (37 C) 98.9 F (37.2 C) 98.8 F (37.1 C) 98.6 F (37 C)  TempSrc:   Oral Oral  SpO2: 100% 100% 100% 96%  Weight:      Height:       No intake or output data in the 24 hours ending 01/20/18 0937 Filed Weights   01/17/18 2038  Weight: 89.1 kg    Exam: General exam: In no acute distress. Respiratory system: Good air movement and clear to auscultation. Cardiovascular system: S1 & S2 heard, RRR.  Gastrointestinal system: Abdomen is nondistended, soft and nontender.  Central nervous system: Alert and oriented. No focal neurological deficits. Extremities: No pedal edema. Skin: No rashes, lesions or ulcers Psychiatry: Judgement and insight appear normal. Mood & affect appropriate.    Data Reviewed:    Labs: Basic Metabolic Panel: Recent Labs  Lab 01/17/18 2214 01/18/18 0549 01/18/18 1206 01/19/18 0557 01/20/18 0559  NA 143 143  --  146* 144  K 4.0 3.7  --  3.4* 3.3*  CL 107 109  --  112* 112*  CO2 25 24  --  27 27  GLUCOSE 138* 129*  --  104* 104*  BUN 26* 22  --  17 26*  CREATININE 0.84 0.83  --  0.77 0.77  CALCIUM 9.2 8.7*  --  8.4* 8.1*  MG  --   --  1.9  --   --    GFR Estimated Creatinine Clearance: 72.1 mL/min (by C-G formula based on SCr of 0.77 mg/dL). Liver Function Tests: No results for input(s): AST, ALT, ALKPHOS, BILITOT, PROT, ALBUMIN in the last 168 hours. No results for input(s): LIPASE, AMYLASE in the last 168 hours. Recent Labs  Lab 01/18/18 0549  AMMONIA 21   Coagulation profile No results for input(s): INR, PROTIME in the last 168 hours.  CBC: Recent Labs  Lab 01/17/18 2214 01/18/18 0549 01/19/18 0557 01/20/18 0559  WBC 9.7  --  8.2 8.6  NEUTROABS 9.0*  --  5.2 5.5  HGB 12.3  --  10.9* 10.4*  HCT 37.6 34.9 32.9* 32.5*  MCV 88.1  --  87.7 90.3  PLT 231  --  197 173   Cardiac Enzymes: No results for input(s): CKTOTAL, CKMB, CKMBINDEX, TROPONINI in the  last 168 hours. BNP (last 3 results) No results for input(s): PROBNP in the last 8760 hours. CBG: No results for input(s): GLUCAP in the last 168 hours. D-Dimer: No results for input(s): DDIMER in the last 72 hours. Hgb A1c: No results for input(s): HGBA1C in the last 72 hours. Lipid Profile: No results for input(s): CHOL, HDL, LDLCALC, TRIG, CHOLHDL, LDLDIRECT in the last 72 hours. Thyroid function studies: No results for input(s): TSH, T4TOTAL, T3FREE, THYROIDAB in the last 72 hours.  Invalid input(s): FREET3 Anemia work up: Recent Labs    01/18/18 [place] 611   Sepsis Labs: Recent Labs  Lab 01/17/18 2214 01/19/18 0557 01/20/18 0559  WBC 9.7 8.2 8.6   Microbiology No results found for this or any previous visit (from the past 240 hour(s)).   Medications:   . carbamazepine  400 mg Oral BID  . enoxaparin (LOVENOX) injection  40 mg Subcutaneous Q24H  . levothyroxine  75 mcg Oral Daily  . lurasidone  120 mg Oral QPM  . QUEtiapine  200 mg Oral QHS  . simvastatin  40 mg Oral QPM   Continuous Infusions:    LOS: 2 days   Charlynne Cousins  [facility] Pager [phone]  *Please refer to [email or web address], password [email or web address] to get updated schedule on who will round on this patient, as hospitalists switch teams weekly. If 7PM-7AM, please contact night-coverage at [email or web address], password [email or web address] for any overnight needs.  01/20/2018, 9:37 AM

## 2018-01-20 NOTE — Care Management Note (Signed)
Case Management Note  Patient Details  Name: Rebecca Novak MRN: [id number] Date of Birth: May 08, 1946  Subjective/Objective:                  Awaiting snf placement   Action/Plan: Plan is to dc in am or sooner if bed available  Expected Discharge Date:                  Expected Discharge Plan:  Skilled Nursing Facility  In-House Referral:  Clinical Social Work  Discharge planning Services  CM Consult  Post Acute Care Choice:    Choice offered to:     DME Arranged:    DME Agency:     HH Arranged:    [place] Agency:     Status of Service:  Completed, signed off  If discussed at [employer], dates discussed:    Additional Comments:  Leeroy Cha, RN 01/20/2018, 11:26 AM

## 2018-01-20 NOTE — Telephone Encounter (Signed)
The patient's sister, Rebecca Novak [phone] wanted to let the provider know that the patient is still in the hospital right now but she will be released either today or tomorrow into rehab.  They determined that her problem was medication error on her part. Patient was able to walk down the hallway with a walker and PT assistance. Patient's number at [facility] is [phone].

## 2018-01-20 NOTE — Progress Notes (Signed)
Physical Therapy Treatment Patient Details Name: Rebecca Novak MRN: [id number] DOB: 14-Mar-1946 Today's Date: 01/20/2018    History of Present Illness 72 y.o. female with medical history significant for bipolar disorder and hypothyroidism, presenting to the ED for evaluation for worsening confusion and recurrent falls    PT Comments    Pt OOB on BSC by NT.  Pt alert with mild grogginess.  Following functional commands.  Assissted off BSC to amb to bathroom.  Unsteady gait.  Very short shuffled steps.  Attempted to amb without walker pt was too unsteady.  Assisted in bathroom with turns and static standing at sink.  Assisted with amb a greater distance in hallway with walker.  Increased stride length with linear gait then back to short steps with turns.  Also present with delayed corrective reaction to LOB.  HIGH FALL RISK.   Pt will need ST Rehab at SNF prior to returning home.    Follow Up Recommendations  SNF     Equipment Recommendations       Recommendations for Other Services       Precautions / Restrictions Precautions Precautions: Fall Precaution Comments: Hx Bipolar Restrictions Weight Bearing Restrictions: No    Mobility  Bed Mobility               General bed mobility comments: Pt OOB on BSC  Transfers Overall transfer level: Needs assistance Equipment used: Rolling walker (2 wheeled);None Transfers: Sit to/from [facility] Sit to Stand: Min assist;Mod assist Stand pivot transfers: Mod assist       General transfer comment: 75% VC's on safety with turns and hand placement to steady self.  Also assisted with toilet transfer.    Ambulation/Gait Ambulation/Gait assistance: Min assist;Mod assist Gait Distance (Feet): 45 Feet Assistive device: Rolling walker (2 wheeled);None Gait Pattern/deviations: Step-to pattern;Step-through pattern;Staggering left;Staggering right Gait velocity: decreased   General Gait Details: Unsteady gait  without walker required Mod Assist.  Required RW to amb in hallway.  Very short shuffled steps and delayed corrective reaction.   HIGH FALL RISK.     Stairs             Wheelchair Mobility    Modified Rankin (Stroke Patients Only)       Balance                                            Cognition Arousal/Alertness: Awake/alert Behavior During Therapy: Flat affect Overall Cognitive Status: No family/caregiver present to determine baseline cognitive functioning                                 General Comments: AxO x 2 required repeat functional cueing and VC's for safety      Exercises      General Comments        Pertinent Vitals/Pain Pain Assessment: Faces Faces Pain Scale: Hurts a little bit Pain Location: "all over" from recent fall Pain Descriptors / Indicators: Discomfort Pain Intervention(s): Monitored during session    Home Living                      Prior Function            PT Goals (current goals can now be found in the care plan section) Progress towards PT goals:  Progressing toward goals    Frequency    Min 2X/week      PT Plan Current plan remains appropriate    Co-evaluation              AM-PAC PT "6 Clicks" Daily Activity  Outcome Measure  Difficulty turning over in bed (including adjusting bedclothes, sheets and blankets)?: A Lot Difficulty moving from lying on back to sitting on the side of the bed? : A Lot Difficulty sitting down on and standing up from a chair with arms (e.g., wheelchair, bedside commode, etc,.)?: A Lot Help needed moving to and from a bed to chair (including a wheelchair)?: A Lot Help needed walking in hospital room?: A Lot Help needed climbing 3-5 steps with a railing? : A Lot 6 Click Score: 12    End of Session Equipment Utilized During Treatment: Gait belt Activity Tolerance: Patient tolerated treatment well Patient left: in chair;with chair alarm  set;with call bell/phone within reach Nurse Communication: Mobility status PT Visit Diagnosis: Other abnormalities of gait and mobility (R26.89)     Time: 3785-8850 PT Time Calculation (min) (ACUTE ONLY): 28 min  Charges:  $Gait Training: 8-22 mins $Therapeutic Activity: 8-22 mins                     {Ajwa Kimberley  PTA [facility]  Acute  Rehab Pager      [phone]

## 2018-01-21 ENCOUNTER — Telehealth: Payer: Self-pay | Admitting: Family Medicine

## 2018-01-21 DIAGNOSIS — E039 Hypothyroidism, unspecified: Secondary | ICD-10-CM | POA: Diagnosis not present

## 2018-01-21 DIAGNOSIS — T421X4S Poisoning by iminostilbenes, undetermined, sequela: Secondary | ICD-10-CM | POA: Diagnosis not present

## 2018-01-21 DIAGNOSIS — R2689 Other abnormalities of gait and mobility: Secondary | ICD-10-CM | POA: Diagnosis not present

## 2018-01-21 DIAGNOSIS — T421X1D Poisoning by iminostilbenes, accidental (unintentional), subsequent encounter: Secondary | ICD-10-CM | POA: Diagnosis not present

## 2018-01-21 DIAGNOSIS — F331 Major depressive disorder, recurrent, moderate: Secondary | ICD-10-CM | POA: Diagnosis not present

## 2018-01-21 DIAGNOSIS — T421X4D Poisoning by iminostilbenes, undetermined, subsequent encounter: Secondary | ICD-10-CM | POA: Diagnosis not present

## 2018-01-21 DIAGNOSIS — L859 Epidermal thickening, unspecified: Secondary | ICD-10-CM | POA: Diagnosis not present

## 2018-01-21 DIAGNOSIS — Z7401 Bed confinement status: Secondary | ICD-10-CM | POA: Diagnosis not present

## 2018-01-21 DIAGNOSIS — G9340 Encephalopathy, unspecified: Secondary | ICD-10-CM | POA: Diagnosis not present

## 2018-01-21 DIAGNOSIS — F319 Bipolar disorder, unspecified: Secondary | ICD-10-CM | POA: Diagnosis not present

## 2018-01-21 DIAGNOSIS — G9349 Other encephalopathy: Secondary | ICD-10-CM | POA: Diagnosis not present

## 2018-01-21 DIAGNOSIS — F3132 Bipolar disorder, current episode depressed, moderate: Secondary | ICD-10-CM | POA: Diagnosis not present

## 2018-01-21 DIAGNOSIS — F4323 Adjustment disorder with mixed anxiety and depressed mood: Secondary | ICD-10-CM | POA: Diagnosis not present

## 2018-01-21 DIAGNOSIS — E785 Hyperlipidemia, unspecified: Secondary | ICD-10-CM | POA: Diagnosis not present

## 2018-01-21 DIAGNOSIS — M6281 Muscle weakness (generalized): Secondary | ICD-10-CM | POA: Diagnosis not present

## 2018-01-21 DIAGNOSIS — M2550 Pain in unspecified joint: Secondary | ICD-10-CM | POA: Diagnosis not present

## 2018-01-21 LAB — BASIC METABOLIC PANEL
ANION GAP: 5 (ref 5–15)
BUN: 19 mg/dL (ref 8–23)
CALCIUM: 8.4 mg/dL — AB (ref 8.9–10.3)
CO2: 29 mmol/L (ref 22–32)
CREATININE: 0.89 mg/dL (ref 0.44–1.00)
Chloride: 111 mmol/L (ref 98–111)
Glucose, Bld: 103 mg/dL — ABNORMAL HIGH (ref 70–99)
Potassium: 3.6 mmol/L (ref 3.5–5.1)
SODIUM: 145 mmol/L (ref 135–145)

## 2018-01-21 LAB — CBC WITH DIFFERENTIAL/PLATELET
BASOS ABS: 0 10*3/uL (ref 0.0–0.1)
BASOS PCT: 0 %
EOS ABS: 0.2 10*3/uL (ref 0.0–0.7)
Eosinophils Relative: 4 %
HCT: 32.1 % — ABNORMAL LOW (ref 36.0–46.0)
Hemoglobin: 10.6 g/dL — ABNORMAL LOW (ref 12.0–15.0)
Lymphocytes Relative: 37 %
Lymphs Abs: 2.1 10*3/uL (ref 0.7–4.0)
MCH: 29.4 pg (ref 26.0–34.0)
MCHC: 33 g/dL (ref 30.0–36.0)
MCV: 89.2 fL (ref 78.0–100.0)
MONO ABS: 0.5 10*3/uL (ref 0.1–1.0)
Monocytes Relative: 8 %
NEUTROS ABS: 2.9 10*3/uL (ref 1.7–7.7)
Neutrophils Relative %: 51 %
PLATELETS: 187 10*3/uL (ref 150–400)
RBC: 3.6 MIL/uL — ABNORMAL LOW (ref 3.87–5.11)
RDW: 14.1 % (ref 11.5–15.5)
WBC: 5.7 10*3/uL (ref 4.0–10.5)

## 2018-01-21 LAB — CARBAMAZEPINE LEVEL, TOTAL: Carbamazepine Lvl: 8.2 ug/mL (ref 4.0–12.0)

## 2018-01-21 MED ORDER — ZOLPIDEM TARTRATE 5 MG PO TABS
5.0000 mg | ORAL_TABLET | Freq: Once | ORAL | Status: AC
  Administered 2018-01-21: 5 mg via ORAL
  Filled 2018-01-21: qty 1

## 2018-01-21 MED ORDER — CARBAMAZEPINE ER 200 MG PO CP12: 400.0000 mg | ORAL_CAPSULE | Freq: Two times a day (BID) | ORAL | 0 refills | Status: DC

## 2018-01-21 NOTE — Discharge Summary (Signed)
Physician Discharge Summary  Rebecca Novak GBT:[id number] DOB: Jun 18, 1945 DOA: 01/17/2018  PCP: Marletta Lor, MD  Admit date: 01/17/2018 Discharge date: 01/21/2018  Admitted From: home Disposition:  SNF  Recommendations for Outpatient Follow-up:  1. Follow up with PCP in 1-2 weeks   Home Health:no Equipment/Devices:none  Discharge Condition:stable CODE STATUS:full Diet recommendation: Heart Healthy  Brief/Interim Summary: 72 y.o. female past medical history of bipolar, hypothyroidism presents to the ED for evaluation of worsening confusion and recurrent falls was found to have carbamazepine level is high.  Discharge Diagnoses:  Principal Problem:   Acute encephalopathy Active Problems:   Hypothyroidism   Recurrent falls   Bipolar disorder (HCC)   Carbamazepine toxicity, undetermined intent, sequela  Acute encephalopathy likely due to initial encounter of carbamazepine toxicity: CT of the head and spine showed no acute intracranial abnormalities, work-up has been negative. Her carbamazepine level was significantly elevated this was decreased, her carbamazepine levels came down.  Physical therapy evaluated the patient the recommended skilled nursing facility.   Hypothyroidism: Continue Synthroid.  Bipolar disorder ([place]) Carbamazepine levels have improved, she was started on carbamazepine 400 mill grams p.o. twice daily.  Discharge Instructions  Discharge Instructions    Diet - low sodium heart healthy   Complete by:  As directed    Face-to-face encounter (required for Medicare/Medicaid patients)   Complete by:  As directed    I Daleen Bo certify that this patient is under my care and that I, or a nurse practitioner or physician's assistant working with me, had a face-to-face encounter that meets the physician face-to-face encounter requirements with this patient on 01/17/2018. The encounter with the patient was in whole, or in part for the following  medical condition(s) which is the primary reason for home health care (List medical condition): Weakness, fall   The encounter with the patient was in whole, or in part, for the following medical condition, which is the primary reason for home health care:  Weakness, fall   I certify that, based on my findings, the following services are medically necessary home health services:  Physical therapy   Reason for Medically Necessary Home Health Services:  Skilled Nursing- Change/Decline in Patient Status   My clinical findings support the need for the above services:  Unable to leave home safely without assistance and/or assistive device   Further, I certify that my clinical findings support that this patient is homebound due to:  Ambulates short distances less than 300 feet   Home Health   Complete by:  As directed    To provide the following care/treatments:   PT [place] work     Increase activity slowly   Complete by:  As directed      Allergies as of 01/21/2018   No Known Allergies     Medication List    TAKE these medications   carbamazepine 200 MG Cp12 12 hr capsule Commonly known as:  EQUETRO Take 2 capsules (400 mg total) by mouth 2 (two) times daily. What changed:    medication strength  how much to take   LATUDA 120 MG Tabs Generic drug:  Lurasidone HCl Take 120 mg by mouth every evening.   levothyroxine 100 MCG tablet Commonly known as:  SYNTHROID, LEVOTHROID Take 1 tablet (100 mcg total) by mouth daily.   metoprolol tartrate 25 MG tablet Commonly known as:  LOPRESSOR Take 1 tablet (25 mg total) by mouth daily as needed (palpitations).   QUEtiapine 200 MG 24  hr tablet Commonly known as:  SEROQUEL XR Take 200 mg by mouth at bedtime.   SF 5000 PLUS 1.1 % Crea dental cream Generic drug:  sodium fluoride Place 1 application onto teeth at bedtime.   simvastatin 40 MG tablet Commonly known as:  ZOCOR TAKE 1 TABLET BY MOUTH AT BEDTIME What  changed:  when to take this      Follow-up Information    Marletta Lor, MD. Schedule an appointment as soon as possible for a visit in 1 week.   Specialty:  Internal Medicine Why:  To be seen for a checkup Contact information: [place] [address] [phone]        Marletta Lor, MD In 1 week.   Specialty:  Internal Medicine Why:  To be seen for a checkup Contact information: [place] [address] [phone]          No Known Allergies  Consultations:  None   Procedures/Studies: Dg Chest 2 View  Result Date: 01/17/2018 CLINICAL DATA:  Fall EXAM: CHEST - 2 VIEW COMPARISON:  12/12/2017 FINDINGS: Heart is normal size. No confluent airspace opacities or effusions. No acute bony abnormality or pneumothorax. IMPRESSION: No acute cardiopulmonary disease. Electronically Signed   By: Rolm Baptise M.D.   On: 01/17/2018 22:38   Ct Head Wo Contrast  Result Date: 01/17/2018 CLINICAL DATA:  Dizziness, fall. EXAM: CT HEAD WITHOUT CONTRAST CT CERVICAL SPINE WITHOUT CONTRAST TECHNIQUE: Multidetector CT imaging of the head and cervical spine was performed following the standard protocol without intravenous contrast. Multiplanar CT image reconstructions of the cervical spine were also generated. COMPARISON:  CT head 02/14/2013 FINDINGS: CT HEAD FINDINGS Brain: There is atrophy and chronic small vessel disease changes. No acute intracranial abnormality. Specifically, no hemorrhage, hydrocephalus, mass lesion, acute infarction, or significant intracranial injury. Vascular: No hyperdense vessel or unexpected calcification. Skull: No acute calvarial abnormality. Sinuses/Orbits: Visualized paranasal sinuses and mastoids clear. Orbital soft tissues unremarkable. Other: None CT CERVICAL SPINE FINDINGS Alignment: No subluxation.  Loss of cervical lordosis. Skull base and vertebrae: No acute fracture. No primary bone lesion or focal  pathologic process. Soft tissues and spinal canal: No prevertebral fluid or swelling. No visible canal hematoma. Disc levels: Diffuse degenerative disc disease and facet disease bilaterally. Upper chest: No acute findings Other: No acute findings IMPRESSION: No acute intracranial abnormality. Atrophy, chronic microvascular disease. Mild degenerative changes diffusely throughout the cervical spine. No acute bony abnormality. Loss of cervical lordosis. Electronically Signed   By: Rolm Baptise M.D.   On: 01/17/2018 23:00   Ct Head Wo Contrast  Result Date: 01/15/2018 CLINICAL DATA:  Patient with altered mental status. EXAM: CT HEAD WITHOUT CONTRAST TECHNIQUE: Contiguous axial images were obtained from the base of the skull through the vertex without intravenous contrast. COMPARISON:  Brain CT 02/14/2013 FINDINGS: Brain: Ventricles and sulci are prominent compatible with atrophy. Periventricular and subcortical white matter hypodensity compatible with chronic microvascular ischemic changes. No evidence for acute cortically based infarct, intracranial hemorrhage, mass lesion or mass-effect. Vascular: Unremarkable Skull: Intact. Sinuses/Orbits: Paranasal sinuses are unremarkable. Mastoid air cells are well aerated. Other: None. IMPRESSION: No acute intracranial process. Atrophy and chronic microvascular ischemic changes. Electronically Signed   By: Lovey Newcomer M.D.   On: 01/15/2018 15:08   Ct Cervical Spine Wo Contrast  Result Date: 01/18/2018 CLINICAL DATA:  72 y/o  F; fall from structure. EXAM: CT CERVICAL SPINE WITHOUT CONTRAST TECHNIQUE: Multidetector CT imaging of the cervical spine  was performed without intravenous contrast. Multiplanar CT image reconstructions were also generated. COMPARISON:  01/17/2018 CT of the cervical spine. FINDINGS: Alignment: Moderate cervical spine levocurvature. Straightening of cervical lordosis without listhesis. Skull base and vertebrae: No acute fracture. No primary bone  lesion or focal pathologic process. Soft tissues and spinal canal: No prevertebral fluid or swelling. No visible canal hematoma. Disc levels: Moderate cervical spondylosis with multilevel disc and facet degenerative changes. Uncovertebral and facet hypertrophy results in bony foraminal stenosis on the right at the C3-4, C4-5, and C5-6 levels and on the left at the C5-6 level. No high-grade bony canal stenosis. Upper chest: Negative. Other: Calcific atherosclerosis of carotid siphons. IMPRESSION: 1. No acute fracture or dislocation. 2. Stable moderate cervical spondylosis. Electronically Signed   By: Kristine Garbe M.D.   On: 01/18/2018 01:48   Ct Cervical Spine Wo Contrast  Result Date: 01/17/2018 CLINICAL DATA:  Dizziness, fall. EXAM: CT HEAD WITHOUT CONTRAST CT CERVICAL SPINE WITHOUT CONTRAST TECHNIQUE: Multidetector CT imaging of the head and cervical spine was performed following the standard protocol without intravenous contrast. Multiplanar CT image reconstructions of the cervical spine were also generated. COMPARISON:  CT head 02/14/2013 FINDINGS: CT HEAD FINDINGS Brain: There is atrophy and chronic small vessel disease changes. No acute intracranial abnormality. Specifically, no hemorrhage, hydrocephalus, mass lesion, acute infarction, or significant intracranial injury. Vascular: No hyperdense vessel or unexpected calcification. Skull: No acute calvarial abnormality. Sinuses/Orbits: Visualized paranasal sinuses and mastoids clear. Orbital soft tissues unremarkable. Other: None CT CERVICAL SPINE FINDINGS Alignment: No subluxation.  Loss of cervical lordosis. Skull base and vertebrae: No acute fracture. No primary bone lesion or focal pathologic process. Soft tissues and spinal canal: No prevertebral fluid or swelling. No visible canal hematoma. Disc levels: Diffuse degenerative disc disease and facet disease bilaterally. Upper chest: No acute findings Other: No acute findings IMPRESSION: No  acute intracranial abnormality. Atrophy, chronic microvascular disease. Mild degenerative changes diffusely throughout the cervical spine. No acute bony abnormality. Loss of cervical lordosis. Electronically Signed   By: Rolm Baptise M.D.   On: 01/17/2018 23:00   Dg Knee Complete 4 Views Left  Result Date: 01/18/2018 CLINICAL DATA:  Fall.  Abrasion of anterior left knee EXAM: LEFT KNEE - COMPLETE 4+ VIEW COMPARISON:  None FINDINGS: Early degenerative changes with early spurring. No acute bony abnormality. Specifically, no fracture, subluxation, or dislocation. Small joint effusion. IMPRESSION: Early degenerative changes and small joint effusion. No acute bony abnormality. Electronically Signed   By: Rolm Baptise M.D.   On: 01/18/2018 01:59   Ct Maxillofacial Wo Contrast  Result Date: 01/18/2018 CLINICAL DATA:  72 y/o  F; dizziness and recent falls. EXAM: CT MAXILLOFACIAL WITHOUT CONTRAST TECHNIQUE: Multidetector CT imaging of the maxillofacial structures was performed. Multiplanar CT image reconstructions were also generated. COMPARISON:  01/17/2018 CT head. FINDINGS: Osseous: No fracture or mandibular dislocation. No destructive process. Orbits: Negative. No traumatic or inflammatory finding. Sinuses: Clear. Incidental high-riding right jugular bulb with thin sigmoid plate. Soft tissues: Small left frontal scalp contusion is stable. Soft tissue swelling over the left anterior chain send of the upper lip, possibly a contusion. 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 Limited intracranial: No significant or unexpected finding. IMPRESSION: 1. Small left frontal scalp contusion and swelling of the left anterior chin and upper lip. 2. No acute facial fracture or mandibular dislocation identified. Electronically Signed   By:  Kristine Garbe M.D.    On: 01/18/2018 01:43    (Echo, Carotid, EGD, Colonoscopy, ERCP)    Subjective: No complains  Discharge Exam: Vitals:   01/20/18 2149 01/21/18 0721  BP: (!) 164/74 (!) 146/79  Pulse: 65 61  Resp: 19 20  Temp: 99.1 F (37.3 C) 97.7 F (36.5 C)  SpO2: 97% 99%   Vitals:   01/20/18 1433 01/20/18 1436 01/20/18 2149 01/21/18 0721  BP: 132/74 108/60 (!) 164/74 (!) 146/79  Pulse: 70 82 65 61  Resp: 16 16 19 20   Temp: 98.8 F (37.1 C) 100 F (37.8 C) 99.1 F (37.3 C) 97.7 F (36.5 C)  TempSrc: Oral Oral    SpO2: 98% 100% 97% 99%  Weight:      Height:        General: Pt is alert, awake, not in acute distress Cardiovascular: RRR, S1/S2 +, no rubs, no gallops Respiratory: CTA bilaterally, no wheezing, no rhonchi Abdominal: Soft, NT, ND, bowel sounds + Extremities: no edema, no cyanosis    The results of significant diagnostics from this hospitalization (including imaging, microbiology, ancillary and laboratory) are listed below for reference.     Microbiology: No results found for this or any previous visit (from the past 240 hour(s)).   Labs: BNP (last 3 results) No results for input(s): BNP in the last 8760 hours. Basic Metabolic Panel: Recent Labs  Lab 01/17/18 2214 01/18/18 0549 01/18/18 1206 01/19/18 0557 01/20/18 0559 01/21/18 0543  NA 143 143  --  146* 144 145  K 4.0 3.7  --  3.4* 3.3* 3.6  CL 107 109  --  112* 112* 111  CO2 25 24  --  27 27 29   GLUCOSE 138* 129*  --  104* 104* 103*  BUN 26* 22  --  17 26* 19  CREATININE 0.84 0.83  --  0.77 0.77 0.89  CALCIUM 9.2 8.7*  --  8.4* 8.1* 8.4*  MG  --   --  1.9  --   --   --    Liver Function Tests: No results for input(s): AST, ALT, ALKPHOS, BILITOT, PROT, ALBUMIN in the last 168 hours. No results for input(s): LIPASE, AMYLASE in the last 168 hours. Recent Labs  Lab 01/18/18 0549  AMMONIA 21   CBC: Recent Labs  Lab 01/17/18 2214 01/18/18 0549 01/19/18 0557 01/20/18 0559 01/21/18 0543  WBC  9.7  --  8.2 8.6 5.7  NEUTROABS 9.0*  --  5.2 5.5 2.9  HGB 12.3  --  10.9* 10.4* 10.6*  HCT 37.6 34.9 32.9* 32.5* 32.1*  MCV 88.1  --  87.7 90.3 89.2  PLT 231  --  197 173 187   Cardiac Enzymes: No results for input(s): CKTOTAL, CKMB, CKMBINDEX, TROPONINI in the last 168 hours. BNP: Invalid input(s): POCBNP CBG: No results for input(s): GLUCAP in the last 168 hours. D-Dimer No results for input(s): DDIMER in the last 72 hours. Hgb A1c No results for input(s): HGBA1C in the last 72 hours. Lipid Profile No results for input(s): CHOL, HDL, LDLCALC, TRIG, CHOLHDL, LDLDIRECT in the last 72 hours. Thyroid function studies No results for input(s): TSH, T4TOTAL, T3FREE, THYROIDAB in the last 72 hours.  Invalid input(s): FREET3 Anemia work up No results for input(s): VITAMINB12, FOLATE, FERRITIN, TIBC, IRON, RETICCTPCT in the last 72 hours. Urinalysis    Component Value Date/Time   COLORURINE YELLOW 01/17/2018 2214   APPEARANCEUR CLEAR 01/17/2018 2214   LABSPEC 1.016 01/17/2018 2214   PHURINE 5.0  01/17/2018 [place] 01/17/2018 2214   HGBUR NEGATIVE 01/17/2018 2214   HGBUR negative 11/01/2008 0856   BILIRUBINUR NEGATIVE 01/17/2018 2214   BILIRUBINUR n 02/02/2015 1425   KETONESUR NEGATIVE 01/17/2018 2214   PROTEINUR NEGATIVE 01/17/2018 2214   UROBILINOGEN 0.2 02/02/2015 1425   UROBILINOGEN 1.0 02/14/2013 1824   NITRITE NEGATIVE 01/17/2018 2214   LEUKOCYTESUR NEGATIVE 01/17/2018 2214   Sepsis Labs Invalid input(s): PROCALCITONIN,  WBC,  LACTICIDVEN Microbiology No results found for this or any previous visit (from the past 240 hour(s)).   Time coordinating discharge: 35 minutes  SIGNED:   Charlynne Cousins, MD  [facility] 01/21/2018, 9:39 AM Pager   If 7PM-7AM, please contact night-coverage [email or web address] Password [email or web address]

## 2018-01-21 NOTE — Progress Notes (Signed)
Patient prefers [facility] awaiting confirmation of bed availability.   Carolin [place]

## 2018-01-21 NOTE — Telephone Encounter (Signed)
Spoke to Corn and advised her that we did not receive DPR scans and it may be released when pt is dicharged from hospital.

## 2018-01-21 NOTE — Progress Notes (Signed)
Report called to SNF

## 2018-01-21 NOTE — Care Management Important Message (Signed)
Important Message  Patient Details  Name: Rebecca Novak MRN: [id number] Date of Birth: 1945-12-04   Medicare Important Message Given:  Yes    Kerin Salen 01/21/2018, 10:37 AMImportant Message  Patient Details  Name: Rebecca Novak MRN: [id number] Date of Birth: December 26, 1945   Medicare Important Message Given:  Yes    Kerin Salen 01/21/2018, 10:37 AM

## 2018-01-21 NOTE — Telephone Encounter (Signed)
Will send to Dr. Burnice Logan as Juluis Rainier

## 2018-01-21 NOTE — Telephone Encounter (Signed)
Copied from [place] [phone]. Topic: Inquiry >> Jan 21, 2018  9:01 AM Conception Chancy, NT wrote: Reason for CRM: patient sister, [name], is calling and would like to know have we received the fax that [place] sent over. She states the patient signed a DPR form in the hospital that she can get her information and wants to make sure we received it.

## 2018-01-22 DIAGNOSIS — F319 Bipolar disorder, unspecified: Secondary | ICD-10-CM | POA: Diagnosis not present

## 2018-01-22 DIAGNOSIS — T421X1D Poisoning by iminostilbenes, accidental (unintentional), subsequent encounter: Secondary | ICD-10-CM | POA: Diagnosis not present

## 2018-01-22 DIAGNOSIS — G9349 Other encephalopathy: Secondary | ICD-10-CM | POA: Diagnosis not present

## 2018-01-22 DIAGNOSIS — M6281 Muscle weakness (generalized): Secondary | ICD-10-CM | POA: Diagnosis not present

## 2018-01-27 NOTE — Telephone Encounter (Signed)
Spoke to [place] sister and she informed me that the [facility] was signed in ED and will bring it by. [name] also wanted to know the name of the nursing facility pt was receiving [facility] from. [name] was informed and stated she received the SCAT forms for [facility] as well as Chrissie Noa Coffey's form.

## 2018-01-28 ENCOUNTER — Other Ambulatory Visit: Payer: Self-pay | Admitting: *Deleted

## 2018-01-28 DIAGNOSIS — E039 Hypothyroidism, unspecified: Secondary | ICD-10-CM | POA: Diagnosis not present

## 2018-01-28 DIAGNOSIS — T421X1D Poisoning by iminostilbenes, accidental (unintentional), subsequent encounter: Secondary | ICD-10-CM | POA: Diagnosis not present

## 2018-01-28 DIAGNOSIS — M6281 Muscle weakness (generalized): Secondary | ICD-10-CM | POA: Diagnosis not present

## 2018-01-28 DIAGNOSIS — F319 Bipolar disorder, unspecified: Secondary | ICD-10-CM | POA: Diagnosis not present

## 2018-01-28 NOTE — Patient Outreach (Signed)
[facility]) Care Management  01/28/2018  Rebecca Novak April 01, 1946 [id number]   Onsite visit to Rebecca Novak with Rebecca Novak, SW at facility, she reports patient will discharge home on Saturday 01/30/18. She states patient is very anxious and may benefit from [facility] care management.  Met with patient. Patient reports she cannot wait to get home to her spouse, Rebecca Novak. She reports that she has been away for 2 weeks one week in [facility] and one week at skilled.  RNCM discussed [name] care management program with patient.  Patient politely declines stating she will have home care and that "will be enough to keep up with". RNCM left a packet and RNCM contact and explained she could call in the future if any new needs identified.  Patient agreed.   Plan to update UM and sign off. Rebecca Novak. Rebecca Purser, RN, BSN, [place] [phone]) Business Cell  [phone]) Toll Free Office

## 2018-01-29 ENCOUNTER — Telehealth: Payer: Self-pay

## 2018-01-29 NOTE — Telephone Encounter (Signed)
Spoke to Ms. Bordon and she will be coming in to [place] to register for PREP on Sept. 16th.

## 2018-02-01 ENCOUNTER — Telehealth: Payer: Self-pay

## 2018-02-01 DIAGNOSIS — R296 Repeated falls: Secondary | ICD-10-CM | POA: Diagnosis not present

## 2018-02-01 DIAGNOSIS — S0083XD Contusion of other part of head, subsequent encounter: Secondary | ICD-10-CM | POA: Diagnosis not present

## 2018-02-01 DIAGNOSIS — F319 Bipolar disorder, unspecified: Secondary | ICD-10-CM | POA: Diagnosis not present

## 2018-02-01 DIAGNOSIS — M6281 Muscle weakness (generalized): Secondary | ICD-10-CM | POA: Diagnosis not present

## 2018-02-01 DIAGNOSIS — I10 Essential (primary) hypertension: Secondary | ICD-10-CM | POA: Diagnosis not present

## 2018-02-01 DIAGNOSIS — T421X4D Poisoning by iminostilbenes, undetermined, subsequent encounter: Secondary | ICD-10-CM | POA: Diagnosis not present

## 2018-02-01 NOTE — Telephone Encounter (Signed)
Okay for verbal orders? Please advise 

## 2018-02-01 NOTE — Telephone Encounter (Signed)
Copied from [place] [phone]. Topic: General - Other >> Feb 01, 2018  3:44 PM Judyann Munson wrote: Reason for CRM: [facility] [place] in Verbal to see pt Twice  a week for 4 weeks for medication management and education,  PT and OT will be evaluating within the next 5  days  Best call number is [phone]

## 2018-02-02 ENCOUNTER — Encounter: Payer: Self-pay | Admitting: Internal Medicine

## 2018-02-02 ENCOUNTER — Ambulatory Visit (INDEPENDENT_AMBULATORY_CARE_PROVIDER_SITE_OTHER): Payer: Medicare Other | Admitting: Internal Medicine

## 2018-02-02 VITALS — BP 102/62 | HR 75 | Temp 98.3°F | Wt 171.2 lb

## 2018-02-02 DIAGNOSIS — F3170 Bipolar disorder, currently in remission, most recent episode unspecified: Secondary | ICD-10-CM | POA: Diagnosis not present

## 2018-02-02 DIAGNOSIS — T421X4S Poisoning by iminostilbenes, undetermined, sequela: Secondary | ICD-10-CM | POA: Diagnosis not present

## 2018-02-02 DIAGNOSIS — G9340 Encephalopathy, unspecified: Secondary | ICD-10-CM

## 2018-02-02 DIAGNOSIS — E039 Hypothyroidism, unspecified: Secondary | ICD-10-CM

## 2018-02-02 MED ORDER — QUETIAPINE FUMARATE ER 200 MG PO TB24: 200.0000 mg | ORAL_TABLET | Freq: Every day | ORAL | 1 refills | Status: DC

## 2018-02-02 MED ORDER — LEVOTHYROXINE SODIUM 100 MCG PO TABS: 100.0000 ug | ORAL_TABLET | Freq: Every day | ORAL | 4 refills | Status: DC

## 2018-02-02 MED ORDER — SIMVASTATIN 40 MG PO TABS: 40.0000 mg | ORAL_TABLET | Freq: Every day | ORAL | 0 refills | Status: DC

## 2018-02-02 MED ORDER — LURASIDONE HCL 120 MG PO TABS: 120.0000 mg | ORAL_TABLET | Freq: Every evening | ORAL | 2 refills | Status: DC

## 2018-02-02 MED ORDER — CARBAMAZEPINE ER 200 MG PO CP12: 400.0000 mg | ORAL_CAPSULE | Freq: Two times a day (BID) | ORAL | 3 refills | Status: DC

## 2018-02-02 NOTE — Telephone Encounter (Signed)
Verbal orders given to [facility]. No further action needed!

## 2018-02-02 NOTE — Patient Instructions (Signed)
Follow-up with Dr. Clovis Pu next week as scheduled  Return in 3 months for follow-up  Call for any concerns or change in your status

## 2018-02-02 NOTE — Telephone Encounter (Signed)
Okay for verbal orders. 

## 2018-02-02 NOTE — Progress Notes (Signed)
Subjective:    Patient ID: Rebecca Novak, female    DOB: [birth year], 72 y.o.   MRN: [id number]  HPI Admit date: 01/17/2018 Discharge date: 01/21/2018  Admitted From: home Disposition:  SNF   Brief/Interim Summary: 72 y.o.femalepast medical history of bipolar, hypothyroidism presents to the ED for evaluation of worsening confusion and recurrent falls was found to have carbamazepine level is high.  Discharge Diagnoses:  Principal Problem:   Acute encephalopathy Active Problems:   Hypothyroidism   Recurrent falls   Bipolar disorder (HCC)   Carbamazepine toxicity, undetermined intent, sequela  72 year old patient who is seen today following a recent hospital discharge and for transitional care management.  She was admitted with subacute mental status changes and was diagnosed with carbamazepine toxicity.  She was discharged to [facility] where she stayed for a week.  She has been home for a few days and apparently doing okay. She drove herself to the office today and is unaccompanied. She feels that she is doing quite well but she has very little insight regarding her medications.  She was seen at home by home services and she does have a weekly medicine pillbox that she uses She is scheduled to see Dr. Clovis Pu her psychiatrist early next week  Hospital records reviewed  Past Medical History:  Diagnosis Date  . ANEMIA, MILD 05/17/2008  . COLONIC POLYPS, HX OF 05/17/2007  . Depression    bipolar  . GLUCOSE INTOLERANCE 11/23/2007  . HAND PAIN 07/31/2008  . HYPERLIPIDEMIA 02/05/2007  . HYPOTHYROIDISM 02/05/2007  . Skin cancer    "scraped off chest and RLE" (02/15/2013)  . SYSTOLIC MURMUR 46/27/0350     Social History   Socioeconomic History  . Marital status: Married    Spouse name: [name]  . Number of children: 0  . Years of education: Not on file  . Highest education level: Not on file  Occupational History  . 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 Financial resource strain: Not hard at all  . Food insecurity:    Worry: Never true    Inability: Never true  . Transportation needs:    Medical: No    Non-medical: No  Tobacco Use  . Smoking status: Former Smoker    Packs/day: 0.33    Years: 44.00    Pack years: 14.52    Types: Cigarettes  . Smokeless tobacco: Never Used  . Tobacco comment: 02/15/2013 "stopped smoking cigarettes ~ 7 yr ago"  Substance and Sexual Activity  . Alcohol use: No  . Drug use: No  . Sexual activity: Yes  Lifestyle  . Physical activity:    Days per week: Not on file    Minutes per session: Not on file  . Stress: Not on file  Relationships  . Social connections:    Talks on phone: Patient refused    Gets together: Patient refused    Attends religious service: Patient refused    Active member of club or organization: Patient refused    Attends meetings of clubs or organizations: Patient refused    Relationship status: Patient refused  . Intimate partner violence:    Fear of current or ex partner: No    Emotionally abused: No    Physically abused: No    Forced sexual activity: No  Other Topics Concern  . Not on file  Social History Narrative  . Not on file    Past Surgical History:  Procedure Laterality  Date  . APPENDECTOMY    . TONSILLECTOMY    . VAGINAL HYSTERECTOMY      Family History  Problem Relation Age of Onset  . Dementia Mother   . Healthy Father        until death at age 47  . Mental illness Brother   . Healthy Sister     No Known Allergies  Current Outpatient Medications on File Prior to Visit  Medication Sig Dispense Refill  . metoprolol tartrate (LOPRESSOR) 25 MG tablet Take 1 tablet (25 mg total) by mouth daily as needed (palpitations). 90 tablet 3  . SF 5000 PLUS 1.1 % CREA dental cream Place 1 application onto teeth at bedtime.      No current facility-administered medications on file prior to visit.     BP 102/62 (BP  Location: Right Arm, Patient Position: Sitting, Cuff Size: Large)   Pulse 75   Temp 98.3 F (36.8 C) (Oral)   Wt 171 lb 3.2 oz (77.7 kg)   SpO2 95%   BMI 26.03 kg/m    Review of Systems  Constitutional: Negative.   HENT: Negative for congestion, dental problem, hearing loss, rhinorrhea, sinus pressure, sore throat and tinnitus.   Eyes: Negative for pain, discharge and visual disturbance.  Respiratory: Negative for cough and shortness of breath.   Cardiovascular: Negative for chest pain, palpitations and leg swelling.  Gastrointestinal: Negative for abdominal distention, abdominal pain, blood in stool, constipation, diarrhea, nausea and vomiting.  Genitourinary: Negative for difficulty urinating, dysuria, flank pain, frequency, hematuria, pelvic pain, urgency, vaginal bleeding, vaginal discharge and vaginal pain.  Musculoskeletal: Negative for arthralgias, gait problem and joint swelling.  Skin: Negative for rash.  Neurological: Negative for dizziness, syncope, speech difficulty, weakness, numbness and headaches.  Hematological: Negative for adenopathy.  Psychiatric/Behavioral: Positive for behavioral problems and decreased concentration. Negative for agitation and dysphoric mood. The patient is not nervous/anxious.        Objective:   Physical Exam  Constitutional: She is oriented to person, place, and time. She appears well-developed and well-nourished.  HENT:  Head: Normocephalic.  Right Ear: External ear normal.  Left Ear: External ear normal.  Mouth/Throat: Oropharynx is clear and moist.  Eyes: Pupils are equal, round, and reactive to light. Conjunctivae and EOM are normal.  Neck: Normal range of motion. Neck supple. No thyromegaly present.  Cardiovascular: Normal rate, regular rhythm, normal heart sounds and intact distal pulses.  Pulmonary/Chest: Effort normal and breath sounds normal.  Abdominal: Soft. Bowel sounds are normal. She exhibits no mass. There is no tenderness.   Musculoskeletal: Normal range of motion.  Lymphadenopathy:    She has no cervical adenopathy.  Neurological: She is alert and oriented to person, place, and time.  Skin: Skin is warm and dry. No rash noted.  Psychiatric: She has a normal mood and affect. Her behavior is normal.          Assessment & Plan:   History of carbamazepine toxicity.  We will check a blood level.  Follow psychiatry in 6 days as scheduled Bipolar disorder Dyslipidemia.  Continue statin therapy Hypothyroidism.  All medications refilled Detailed written instructions dispensed Follow-up psychiatry Follow-up in 3 months  Marletta Lor

## 2018-02-03 ENCOUNTER — Ambulatory Visit: Payer: Self-pay | Admitting: Internal Medicine

## 2018-02-03 DIAGNOSIS — T421X4D Poisoning by iminostilbenes, undetermined, subsequent encounter: Secondary | ICD-10-CM | POA: Diagnosis not present

## 2018-02-03 DIAGNOSIS — F319 Bipolar disorder, unspecified: Secondary | ICD-10-CM | POA: Diagnosis not present

## 2018-02-03 DIAGNOSIS — R296 Repeated falls: Secondary | ICD-10-CM | POA: Diagnosis not present

## 2018-02-03 DIAGNOSIS — M6281 Muscle weakness (generalized): Secondary | ICD-10-CM | POA: Diagnosis not present

## 2018-02-03 DIAGNOSIS — I10 Essential (primary) hypertension: Secondary | ICD-10-CM | POA: Diagnosis not present

## 2018-02-03 DIAGNOSIS — S0083XD Contusion of other part of head, subsequent encounter: Secondary | ICD-10-CM | POA: Diagnosis not present

## 2018-02-03 LAB — CARBAMAZEPINE LEVEL, TOTAL: CARBAMAZEPINE LVL: 11.8 mg/L (ref 4.0–12.0)

## 2018-02-03 NOTE — Telephone Encounter (Signed)
PT- from [facility] arrived and found that patient had fallen. She reports contusions on the face and patient is vomiting. Patient is refusing ED. Patient has fallen again from the bed.Patient still declines EMS/ED visit. Patient has declined to start PT until next week.  Reason for Disposition . Vomiting once or more  Answer Assessment - Initial Assessment Questions 1. MECHANISM: "How did the injury happen?" For falls, ask: "What height did you fall from?" and "What surface did you fall against?"      Tripped- over bucket-patient told PT- patient was going to the bathroom- there is no rug- patient is confused 2. ONSET: "When did the injury happen?" (Minutes or hours ago)      Today- this morning- and second fall was from bed 3. NEUROLOGIC SYMPTOMS: "Was there any loss of consciousness?" "Are there any other neurological symptoms?"      Unknown- patient was seen yesterday for increasing confusion and increasing falls 4. MENTAL STATUS: "Does the person know who he is, who you are, and where he is?"      Patient is aware of who she is and where she is- declines hospital 5. LOCATION: "What part of the head was hit?"      Per PT- L eye bruised with laceration under eye and nose looks bruised 6. SCALP APPEARANCE: "What does the scalp look like? Is it bleeding now?" If so, ask: "Is it difficult to stop?"      n/a 7. SIZE: For cuts, bruises, or swelling, ask: "How large is it?" (e.g., inches or centimeters)      See above- patient is declining assistance or ED 8. PAIN: "Is there any pain?" If so, ask: "How bad is it?"  (e.g., Scale 1-10; or mild, moderate, severe)     no 9. TETANUS: For any breaks in the skin, ask: "When was the last tetanus booster?"     Not asked 10. OTHER SYMPTOMS: "Do you have any other symptoms?" (e.g., neck pain, vomiting)       Patient has been vomiting since she feel- purple in color- denies foods that color 11. PREGNANCY: "Is there any chance you are pregnant?" "When was  your last menstrual period?"       n/a  Protocols used: HEAD INJURY-A-AH

## 2018-02-03 NOTE — Telephone Encounter (Signed)
Per Dr Raliegh Ip have attempted to reach sister to advise of pt's condition and to see if she can go check on her.   LMTCB for sister [name] [facility])

## 2018-02-04 ENCOUNTER — Other Ambulatory Visit: Payer: Self-pay | Admitting: Internal Medicine

## 2018-02-04 ENCOUNTER — Other Ambulatory Visit: Payer: Self-pay

## 2018-02-04 DIAGNOSIS — S0083XD Contusion of other part of head, subsequent encounter: Secondary | ICD-10-CM | POA: Diagnosis not present

## 2018-02-04 DIAGNOSIS — R296 Repeated falls: Secondary | ICD-10-CM | POA: Diagnosis not present

## 2018-02-04 DIAGNOSIS — M6281 Muscle weakness (generalized): Secondary | ICD-10-CM | POA: Diagnosis not present

## 2018-02-04 DIAGNOSIS — T421X4D Poisoning by iminostilbenes, undetermined, subsequent encounter: Secondary | ICD-10-CM | POA: Diagnosis not present

## 2018-02-04 DIAGNOSIS — I10 Essential (primary) hypertension: Secondary | ICD-10-CM | POA: Diagnosis not present

## 2018-02-04 DIAGNOSIS — F319 Bipolar disorder, unspecified: Secondary | ICD-10-CM | POA: Diagnosis not present

## 2018-02-04 DIAGNOSIS — T421X4S Poisoning by iminostilbenes, undetermined, sequela: Secondary | ICD-10-CM

## 2018-02-04 NOTE — Telephone Encounter (Signed)
Spoke with pt's sister [name] [facility]). She is out of town at the moment but does expect to return tomorrow evening. She was not aware pt has had recent falls. She was aware PT was to come out this week. She has concerns about pt's home care and ability to care for herself. She states pt is not eating, does not routinely wear clean clothes and is not able to care for and clean her home. She would like to have more assistance than just PT. She has asked for a Education officer, museum to come out, I suggested we place a referral to [facility] for help with resources. She is agreeable. She is working on getting a POA and HCPOA to be able to make decisions in case pt becomes incapacitated for any reason as she is very concerned about her current and future health.  She has asked me to contact pt this afternoon to assess her, can call sister back if needed.  Referral placed to [facility].

## 2018-02-04 NOTE — Telephone Encounter (Signed)
Source Subject Topic  Rebecca Novak (Patient) Rebecca Novak (Patient) General - Other  Summary: nurse calling to update provider  Reason for CRM: Nurse Stoney Bang from [place] home [facility] [phone] calling to let provider know that she went to the home today and the pt states that she fell yesterday on the floor don't know what she hit she has a lt black eye she can see out of the eye her rt thumb swollen hot and fluid drainage from thumb she can move and bend finger just complain of the pain   02/04/2018 01:56 PM Phone (Incoming)    Nurse Stoney Bang from [facility] home [facility] [phone]     Source Subject Topic  Rebecca Novak (Patient) Rebecca Novak (Patient) Quick Communication - See Telephone Encounter  CRM for notification. See Telephone encounter for: 02/04/18.    Royce Macadamia OT just completed eval with pt.     Requesting VO for 1 week 1 and 2 week 3     CB: [phone]       ALSO, wanted to make PCP aware that pt right thumb is swollen and red from a fall. They advised pt to call our office but not sure if pt is going to follow advise.

## 2018-02-04 NOTE — Telephone Encounter (Signed)
Have attempted multiple times to contact pt at number in chart. It rings fast busy and seems to disconnect.   Hoyle Sauer - could you please try to contact pt and assess? Thanks!

## 2018-02-04 NOTE — Patient Outreach (Signed)
[name]) Care Management  02/04/2018  Rebecca Novak Aug 03, 1945 [id number]   Referral Date: Referral Source: Referral Reason: medication management, social worker, ADL assistance   Outreach Attempt: spoke with patient.  She is able to verify HIPAA.  Discussed reason for referral.  Patient states that she is able to bathe and dress self but has had some recent falls.  Patient reports that she manages her medications with no problems.  Discussed [facility] services and patient is agreeable to nurse only at this time for assessment due to recent falls and recent ed/hospital visits.    Patient lives in the home with significant other.  She has a sister Rebecca Novak who is also involved with patient and is aware of the referral.  Patient states she is able to bathe and dress herself and fix her something to eat.  Patient states she takes medications prescribed and states she has no problems managing.  Patient states she does drive but will not be driving right now due to a fall on yesterday that she injured her thumb.  Patient declines any questions or further concerns on call.     Plan: RN CM will refer to Community nurse for homes assessment due to recent falls and questionable care  Issues.     Rebecca Baseman, RN, MSN [facility] Care Management Care Management Coordinator Direct Line [phone] Toll Free: [phone]  Fax: [phone]

## 2018-02-04 NOTE — Addendum Note (Signed)
Addended by: Wynn Banker H on: 02/04/2018 11:06 AM   Modules accepted: Orders

## 2018-02-05 ENCOUNTER — Telehealth: Payer: Self-pay | Admitting: Internal Medicine

## 2018-02-05 NOTE — Telephone Encounter (Signed)
Called Pt but she is still sleeping. Pt sig. Other was advise to tell her to return phone call. I will try again later.

## 2018-02-05 NOTE — Telephone Encounter (Signed)
Copied from [place] [id number]. Topic: Quick Communication - See Telephone Encounter >> Feb 04, 2018 12:47 PM Antonieta Iba C wrote: CRM for notification. See Telephone encounter for: 02/04/18.  Royce Macadamia OT just completed eval with pt.   Requesting VO for 1 week 1 and 2 week 3    CB: [phone]    ALSO, wanted to make PCP aware that pt right thumb is swollen and red from a fall. They advised pt to call our office but not sure if pt is going to follow advise.

## 2018-02-05 NOTE — Telephone Encounter (Signed)
Sharyn Lull with [facility] calling to check the status of the verbal orders. Please advise. Is wanting to start next week.

## 2018-02-08 DIAGNOSIS — F319 Bipolar disorder, unspecified: Secondary | ICD-10-CM | POA: Diagnosis not present

## 2018-02-08 DIAGNOSIS — I10 Essential (primary) hypertension: Secondary | ICD-10-CM | POA: Diagnosis not present

## 2018-02-08 DIAGNOSIS — T421X4D Poisoning by iminostilbenes, undetermined, subsequent encounter: Secondary | ICD-10-CM | POA: Diagnosis not present

## 2018-02-08 DIAGNOSIS — M6281 Muscle weakness (generalized): Secondary | ICD-10-CM | POA: Diagnosis not present

## 2018-02-08 DIAGNOSIS — R296 Repeated falls: Secondary | ICD-10-CM | POA: Diagnosis not present

## 2018-02-08 DIAGNOSIS — F3173 Bipolar disorder, in partial remission, most recent episode manic: Secondary | ICD-10-CM | POA: Diagnosis not present

## 2018-02-08 DIAGNOSIS — S0083XD Contusion of other part of head, subsequent encounter: Secondary | ICD-10-CM | POA: Diagnosis not present

## 2018-02-08 NOTE — Telephone Encounter (Signed)
Okay for verbal orders? Please advise 

## 2018-02-08 NOTE — Telephone Encounter (Signed)
Verbal orders given to Michelle. 

## 2018-02-08 NOTE — Telephone Encounter (Signed)
Okay for verbal orders. 

## 2018-02-09 DIAGNOSIS — R296 Repeated falls: Secondary | ICD-10-CM | POA: Diagnosis not present

## 2018-02-09 DIAGNOSIS — M6281 Muscle weakness (generalized): Secondary | ICD-10-CM | POA: Diagnosis not present

## 2018-02-09 DIAGNOSIS — S0083XD Contusion of other part of head, subsequent encounter: Secondary | ICD-10-CM | POA: Diagnosis not present

## 2018-02-09 DIAGNOSIS — I10 Essential (primary) hypertension: Secondary | ICD-10-CM | POA: Diagnosis not present

## 2018-02-09 DIAGNOSIS — T421X4D Poisoning by iminostilbenes, undetermined, subsequent encounter: Secondary | ICD-10-CM | POA: Diagnosis not present

## 2018-02-09 DIAGNOSIS — F319 Bipolar disorder, unspecified: Secondary | ICD-10-CM | POA: Diagnosis not present

## 2018-02-10 ENCOUNTER — Other Ambulatory Visit: Payer: Self-pay

## 2018-02-10 ENCOUNTER — Telehealth: Payer: Self-pay | Admitting: *Deleted

## 2018-02-10 DIAGNOSIS — S0083XD Contusion of other part of head, subsequent encounter: Secondary | ICD-10-CM | POA: Diagnosis not present

## 2018-02-10 DIAGNOSIS — I10 Essential (primary) hypertension: Secondary | ICD-10-CM | POA: Diagnosis not present

## 2018-02-10 DIAGNOSIS — R296 Repeated falls: Secondary | ICD-10-CM | POA: Diagnosis not present

## 2018-02-10 DIAGNOSIS — M6281 Muscle weakness (generalized): Secondary | ICD-10-CM | POA: Diagnosis not present

## 2018-02-10 DIAGNOSIS — F319 Bipolar disorder, unspecified: Secondary | ICD-10-CM | POA: Diagnosis not present

## 2018-02-10 DIAGNOSIS — T421X4D Poisoning by iminostilbenes, undetermined, subsequent encounter: Secondary | ICD-10-CM | POA: Diagnosis not present

## 2018-02-10 NOTE — Telephone Encounter (Signed)
Copied from [place] [id number]. Topic: General - Other >> Feb 09, 2018  4:41 PM Sheran Luz wrote: Reason for CRM: Pts sister [name] called inquiring about getting Dr. Burnice Logan to sign a medical power of attorney form for pt and would like to know when she could come by to discuss that. Please advise.   Left message on machine for [name] to return our call. PCP can not sign medical power of attorney.   CRM

## 2018-02-10 NOTE — Patient Outreach (Signed)
[facility] [facility]) Care Management  02/10/2018  Rebecca Novak 03/26/1946 [id number]   Referral received from telephonic care coordinator for home assessment due to recent falls, assess care needs.  72 year old with history of hypothyroidism, recurrent falls, carbamazepine toxicity, bipolar disorder.  Care Cooridination: RNCM called to schedule a home visit. Client is asleep. HIPPA compliant message left.  Client returned call prior to closing out note. She reports she is active with home health and questions the need for RNCM. RNCM discussed the difference and client is agreeable to home visit.  Plan: Home visit scheduled for next week.  Thea Silversmith, RN, MSN, [place] Coordinator Cell: [phone]

## 2018-02-11 ENCOUNTER — Telehealth: Payer: Self-pay | Admitting: Internal Medicine

## 2018-02-11 DIAGNOSIS — T421X4D Poisoning by iminostilbenes, undetermined, subsequent encounter: Secondary | ICD-10-CM | POA: Diagnosis not present

## 2018-02-11 DIAGNOSIS — F319 Bipolar disorder, unspecified: Secondary | ICD-10-CM | POA: Diagnosis not present

## 2018-02-11 DIAGNOSIS — S0083XD Contusion of other part of head, subsequent encounter: Secondary | ICD-10-CM | POA: Diagnosis not present

## 2018-02-11 DIAGNOSIS — R296 Repeated falls: Secondary | ICD-10-CM | POA: Diagnosis not present

## 2018-02-11 DIAGNOSIS — M6281 Muscle weakness (generalized): Secondary | ICD-10-CM | POA: Diagnosis not present

## 2018-02-11 DIAGNOSIS — I10 Essential (primary) hypertension: Secondary | ICD-10-CM | POA: Diagnosis not present

## 2018-02-11 NOTE — Telephone Encounter (Signed)
Copied from [place] [id number]. Topic: Quick Communication - See Telephone Encounter >> Feb 11, 2018  9:59 AM Ahmed Prima L wrote: CRM for notification. See Telephone encounter for: 02/11/18.  Patient would like to speak with Tillie Rung, I asked what it was in regards too, she said she would rather not tell me. Please advise.

## 2018-02-11 NOTE — Telephone Encounter (Signed)
Pt calling Tillie Rung back stating that she had lost the call please call her back at [phone]

## 2018-02-11 NOTE — Telephone Encounter (Signed)
FYI   Spoke to pt and she stated she is fine. She stated that she is getting help from [facility] and they are taking care of her. No further action needed!  Pt wanted to wish dr.Kwiatkowski a happy retirement!

## 2018-02-16 ENCOUNTER — Telehealth: Payer: Self-pay | Admitting: Internal Medicine

## 2018-02-16 DIAGNOSIS — M6281 Muscle weakness (generalized): Secondary | ICD-10-CM | POA: Diagnosis not present

## 2018-02-16 DIAGNOSIS — T421X4D Poisoning by iminostilbenes, undetermined, subsequent encounter: Secondary | ICD-10-CM | POA: Diagnosis not present

## 2018-02-16 DIAGNOSIS — I10 Essential (primary) hypertension: Secondary | ICD-10-CM | POA: Diagnosis not present

## 2018-02-16 DIAGNOSIS — S0083XD Contusion of other part of head, subsequent encounter: Secondary | ICD-10-CM | POA: Diagnosis not present

## 2018-02-16 DIAGNOSIS — F319 Bipolar disorder, unspecified: Secondary | ICD-10-CM | POA: Diagnosis not present

## 2018-02-16 DIAGNOSIS — R296 Repeated falls: Secondary | ICD-10-CM | POA: Diagnosis not present

## 2018-02-16 NOTE — Telephone Encounter (Signed)
Left message to return phone call.

## 2018-02-16 NOTE — Progress Notes (Signed)
Azure Report   Patient Details  Name: Rebecca Novak MRN: [id number] Date of Birth: 11-Aug-1945 Age: 73 y.o. PCP: Marletta Lor, MD  Vitals:   02/16/18 1609  BP: 108/70  Pulse: 68  Resp: 18  SpO2: 97%  Weight: 165 lb 3.2 oz (74.9 kg)     [facility] Eval - 02/16/18 1600      Referral    Referring Provider  Dr. Salvadore Farber    Reason for referral  Inactivity;Other    Program Start Date  02/17/18      Measurement   Waist Circumference  36.5 inches    Hip Circumference  41 inches    Body fat  42.5 percent      Information for Trainer   Goals  "to gain strength & decrease falling risk"    Current Exercise  none    Orthopedic Concerns  none    Current Barriers  fear    Restrictions/Precautions  Fall risk      Timed Up and Go (TUGS)   Timed Up and Go  Moderate risk 10-12 seconds   11.23     Mobility and Daily Activities   I find it easy to walk up or down two or more flights of stairs.  2    I have no trouble taking out the trash.  4    I do housework such as vacuuming and dusting on my own without difficulty.  4    I can easily lift a gallon of milk (8lbs).  4    I can easily walk a mile.  1    I have no trouble reaching into high cupboards or reaching down to pick up something from the floor.  4    I do not have trouble doing out-door work such as Armed forces logistics/support/administrative officer, raking leaves, or gardening.  1      Mobility and Daily Activities   I feel younger than my age.  1    I feel independent.  4    I feel energetic.  1    I live an active life.   3    I feel strong.  3    I feel healthy.  3    I feel active as other people my age.  3      How fit and strong are you.   Fit and Strong Total Score  38      Past Medical History:  Diagnosis Date  . ANEMIA, MILD 05/17/2008  . COLONIC POLYPS, HX OF 05/17/2007  . Depression    bipolar  . GLUCOSE INTOLERANCE 11/23/2007  . HAND PAIN 07/31/2008  . HYPERLIPIDEMIA 02/05/2007  . HYPOTHYROIDISM  02/05/2007  . Skin cancer    "scraped off chest and RLE" (02/15/2013)  . SYSTOLIC MURMUR 94/17/4081   Past Surgical History:  Procedure Laterality Date  . APPENDECTOMY    . TONSILLECTOMY    . VAGINAL HYSTERECTOMY     Social History   Tobacco Use  Smoking Status Former Smoker  . Packs/day: 0.33  . Years: 44.00  . Pack years: 14.52  . Types: Cigarettes  Smokeless Tobacco Never Used  Tobacco Comment   02/15/2013 "stopped smoking cigarettes ~ 7 yr ago"    The 12-week PREP will begin on 9/25 on Wed/Fri from 12:30-1:30.  Vanita Ingles 02/16/2018, 4:11 PM

## 2018-02-16 NOTE — Telephone Encounter (Signed)
Copied from [place] [id number]. Topic: General - Other >> Feb 16, 2018  2:29 PM Keene Breath wrote: Reason for CRM: Megan with [facility] called to inform the nurse that patient was supposed to be d/c but after her fall recently, patient has been having some memory issues.  Patient will therefore be seen 1x wk for 3 wks, and will request that the social worker sees patient this week.  Please advise.  CB# [phone]

## 2018-02-17 ENCOUNTER — Other Ambulatory Visit: Payer: Self-pay

## 2018-02-17 DIAGNOSIS — T421X4D Poisoning by iminostilbenes, undetermined, subsequent encounter: Secondary | ICD-10-CM | POA: Diagnosis not present

## 2018-02-17 DIAGNOSIS — R296 Repeated falls: Secondary | ICD-10-CM | POA: Diagnosis not present

## 2018-02-17 DIAGNOSIS — S0083XD Contusion of other part of head, subsequent encounter: Secondary | ICD-10-CM | POA: Diagnosis not present

## 2018-02-17 DIAGNOSIS — M6281 Muscle weakness (generalized): Secondary | ICD-10-CM | POA: Diagnosis not present

## 2018-02-17 DIAGNOSIS — I10 Essential (primary) hypertension: Secondary | ICD-10-CM | POA: Diagnosis not present

## 2018-02-17 DIAGNOSIS — F319 Bipolar disorder, unspecified: Secondary | ICD-10-CM | POA: Diagnosis not present

## 2018-02-17 NOTE — Patient Outreach (Signed)
[place] [facility]) Care Management  02/17/2018  BREN BORYS 07/10/1945 [id number]   Home visit scheduled. RNCM arrived at client's home. Present was client's husband. Mrs. Rann request to reschedule home visit. She states she does not feel well, but is not with specific with complaints. RNCM offered to assess client and referred to provider. Client states she does not need to see a doctor and request RNCM come back tomorrow.  Plan: home visit rescheduled.  Thea Silversmith, RN, MSN, [place] Coordinator Cell: [phone]

## 2018-02-18 ENCOUNTER — Emergency Department (HOSPITAL_COMMUNITY): Payer: Medicare Other

## 2018-02-18 ENCOUNTER — Other Ambulatory Visit: Payer: Self-pay

## 2018-02-18 ENCOUNTER — Emergency Department (HOSPITAL_COMMUNITY)
Admission: EM | Admit: 2018-02-18 | Discharge: 2018-02-18 | Disposition: A | Discharge status: Home / Self Care | Payer: Medicare Other | Attending: Emergency Medicine | Admitting: Emergency Medicine

## 2018-02-18 ENCOUNTER — Ambulatory Visit: Payer: Self-pay | Admitting: *Deleted

## 2018-02-18 ENCOUNTER — Encounter (HOSPITAL_COMMUNITY): Payer: Self-pay

## 2018-02-18 DIAGNOSIS — E860 Dehydration: Secondary | ICD-10-CM | POA: Diagnosis not present

## 2018-02-18 DIAGNOSIS — E039 Hypothyroidism, unspecified: Secondary | ICD-10-CM | POA: Diagnosis not present

## 2018-02-18 DIAGNOSIS — R296 Repeated falls: Secondary | ICD-10-CM | POA: Diagnosis not present

## 2018-02-18 DIAGNOSIS — Z87891 Personal history of nicotine dependence: Secondary | ICD-10-CM | POA: Insufficient documentation

## 2018-02-18 DIAGNOSIS — W19XXXA Unspecified fall, initial encounter: Secondary | ICD-10-CM | POA: Diagnosis not present

## 2018-02-18 DIAGNOSIS — Z79899 Other long term (current) drug therapy: Secondary | ICD-10-CM | POA: Insufficient documentation

## 2018-02-18 DIAGNOSIS — I499 Cardiac arrhythmia, unspecified: Secondary | ICD-10-CM | POA: Diagnosis not present

## 2018-02-18 DIAGNOSIS — Z85828 Personal history of other malignant neoplasm of skin: Secondary | ICD-10-CM | POA: Insufficient documentation

## 2018-02-18 DIAGNOSIS — R531 Weakness: Secondary | ICD-10-CM | POA: Diagnosis not present

## 2018-02-18 DIAGNOSIS — R2681 Unsteadiness on feet: Secondary | ICD-10-CM | POA: Insufficient documentation

## 2018-02-18 DIAGNOSIS — S0083XD Contusion of other part of head, subsequent encounter: Secondary | ICD-10-CM | POA: Diagnosis not present

## 2018-02-18 DIAGNOSIS — R0902 Hypoxemia: Secondary | ICD-10-CM | POA: Diagnosis not present

## 2018-02-18 DIAGNOSIS — I959 Hypotension, unspecified: Secondary | ICD-10-CM | POA: Diagnosis not present

## 2018-02-18 DIAGNOSIS — M6281 Muscle weakness (generalized): Secondary | ICD-10-CM | POA: Insufficient documentation

## 2018-02-18 DIAGNOSIS — I10 Essential (primary) hypertension: Secondary | ICD-10-CM | POA: Diagnosis not present

## 2018-02-18 DIAGNOSIS — T421X4D Poisoning by iminostilbenes, undetermined, subsequent encounter: Secondary | ICD-10-CM | POA: Diagnosis not present

## 2018-02-18 DIAGNOSIS — R4020 Unspecified coma: Secondary | ICD-10-CM | POA: Diagnosis not present

## 2018-02-18 DIAGNOSIS — I951 Orthostatic hypotension: Secondary | ICD-10-CM

## 2018-02-18 DIAGNOSIS — F319 Bipolar disorder, unspecified: Secondary | ICD-10-CM | POA: Diagnosis not present

## 2018-02-18 LAB — COMPREHENSIVE METABOLIC PANEL
ALT: 13 U/L (ref 0–44)
ANION GAP: 10 (ref 5–15)
AST: 19 U/L (ref 15–41)
Albumin: 4.1 g/dL (ref 3.5–5.0)
Alkaline Phosphatase: 78 U/L (ref 38–126)
BILIRUBIN TOTAL: 0.4 mg/dL (ref 0.3–1.2)
BUN: 25 mg/dL — ABNORMAL HIGH (ref 8–23)
CHLORIDE: 105 mmol/L (ref 98–111)
CO2: 26 mmol/L (ref 22–32)
Calcium: 8.9 mg/dL (ref 8.9–10.3)
Creatinine, Ser: 1.03 mg/dL — ABNORMAL HIGH (ref 0.44–1.00)
GFR calc Af Amer: >60 mL/min (ref >60)
GFR, EST NON AFRICAN AMERICAN: 52 mL/min — AB (ref >60)
Glucose, Bld: 110 mg/dL — ABNORMAL HIGH (ref 70–99)
Potassium: 3.9 mmol/L (ref 3.5–5.1)
Sodium: 141 mmol/L (ref 135–145)
Total Protein: 7.1 g/dL (ref 6.5–8.1)

## 2018-02-18 LAB — URINALYSIS, ROUTINE W REFLEX MICROSCOPIC
Bilirubin Urine: NEGATIVE
Glucose, UA: NEGATIVE mg/dL
Hgb urine dipstick: NEGATIVE
KETONES UR: 5 mg/dL — AB
LEUKOCYTES UA: NEGATIVE
NITRITE: NEGATIVE
Protein, ur: NEGATIVE mg/dL
Specific Gravity, Urine: 1.018 (ref 1.005–1.030)
pH: 5 (ref 5.0–8.0)

## 2018-02-18 LAB — CBC WITH DIFFERENTIAL/PLATELET
BASOS ABS: 0 10*3/uL (ref 0.0–0.1)
Basophils Relative: 0 %
Eosinophils Absolute: 0 10*3/uL (ref 0.0–0.7)
Eosinophils Relative: 0 %
HCT: 40 % (ref 36.0–46.0)
Hemoglobin: 13 g/dL (ref 12.0–15.0)
LYMPHS PCT: 12 %
Lymphs Abs: 0.9 10*3/uL (ref 0.7–4.0)
MCH: 29.5 pg (ref 26.0–34.0)
MCHC: 32.5 g/dL (ref 30.0–36.0)
MCV: 90.9 fL (ref 78.0–100.0)
Monocytes Absolute: 0.4 10*3/uL (ref 0.1–1.0)
Monocytes Relative: 6 %
Neutro Abs: 6.1 10*3/uL (ref 1.7–7.7)
Neutrophils Relative %: 82 %
PLATELETS: 241 10*3/uL (ref 150–400)
RBC: 4.4 MIL/uL (ref 3.87–5.11)
RDW: 14.2 % (ref 11.5–15.5)
WBC: 7.4 10*3/uL (ref 4.0–10.5)

## 2018-02-18 LAB — TROPONIN I

## 2018-02-18 LAB — I-STAT CG4 LACTIC ACID, ED: LACTIC ACID, VENOUS: 1.01 mmol/L (ref 0.5–1.9)

## 2018-02-18 LAB — CARBAMAZEPINE LEVEL, TOTAL: Carbamazepine Lvl: 13.8 ug/mL — ABNORMAL HIGH (ref 4.0–12.0)

## 2018-02-18 MED ORDER — SODIUM CHLORIDE 0.9 % IV BOLUS
1000.0000 mL | Freq: Once | INTRAVENOUS | Status: AC
  Administered 2018-02-18: 1000 mL via INTRAVENOUS

## 2018-02-18 NOTE — Telephone Encounter (Signed)
Home Health RN, Denton Brick with [facility] is currently on a visit with the patient. She reports Mrs. Heyer fell to the floor while she Investment banker, corporate) was outside preparing to leave. RN reports the patient has no signs of injury from the fall and did not hit her head. The nurse and the PT report she is shuffling with her walk today, more than the last visit. They also report she has slurred speech today that she did not have on the last visit. Patient denies vision changes/one-sided weakness. RN reports no asymmetrical features at this time. No fever but had viral illness last week. Orthostatics sitting 88/60 sitting 64/48 standing, latest was 104/64 p.76 sitting. Patient reporting she had dizziness without vertigo while standing at this time. Advised ER evaluation at this time. RN to phone EMS now.    Routing to PCP  Answer Assessment - Initial Assessment Questions 1. REASON FOR CALL or QUESTION: "What is your reason for calling today?" or "How can I best help you?" or "What question do you have that I can help answer?"     [facility] RN calling with information regarding patient's health during today's visit. PT also at the home visit at this time.  Protocols used: INFORMATION ONLY CALL-A-AH

## 2018-02-18 NOTE — ED Provider Notes (Signed)
[facility] DEPT Provider Note   CSN: [id number] Arrival date & time: 02/18/18  1502     History   Chief Complaint Chief Complaint  Patient presents with  . Gait Problem  . Fall  . Fever    HPI Rebecca Novak is a 72 y.o. female.  HPI   72 year old female with past medical history as below here with gait unsteadiness.  Patient has history of bipolar disorder and history is somewhat limited as she is somewhat evasive on exam and history.  However, she states that she was sent here because she fell today.  Per review of telephone notes, the patient was getting physical therapy today and her physical therapist noted increased unsteadiness in her gait.  She also had a fall due to weakness in her legs, but did not hit her head.  There was no significant trauma.  Patient states that she has been well and would like to return home.  However, she does note she is been intermittently dizzy upon standing and she was markedly orthostatic per review of telephone notes.  She denies any recent fevers or chills.  No abdominal pain, nausea, vomiting, or diarrhea.  No other complaints.  No recent medication changes.  Past Medical History:  Diagnosis Date  . ANEMIA, MILD 05/17/2008  . COLONIC POLYPS, HX OF 05/17/2007  . Depression    bipolar  . GLUCOSE INTOLERANCE 11/23/2007  . HAND PAIN 07/31/2008  . HYPERLIPIDEMIA 02/05/2007  . HYPOTHYROIDISM 02/05/2007  . Skin cancer    "scraped off chest and RLE" (02/15/2013)  . SYSTOLIC MURMUR 60/73/7106    Patient Active Problem List   Diagnosis Date Noted  . 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 Recurrent falls 01/18/2018  . Acute encephalopathy 01/18/2018  . Bipolar disorder ([place]) 01/18/2018  . Palpitations 12/02/2017  . Syncope 10/26/2015  . Nausea & vomiting 02/15/2013  . Weakness of both legs 02/15/2013  . Orthostatic hypotension 02/15/2013  . HAND PAIN 07/31/2008  . Anemia  05/17/2008  . GLUCOSE INTOLERANCE 11/23/2007  . History of colonic polyps 05/17/2007  . Hypothyroidism 02/05/2007  . Dyslipidemia 02/05/2007    Past Surgical History:  Procedure Laterality Date  . APPENDECTOMY    . TONSILLECTOMY    . VAGINAL HYSTERECTOMY       OB History   None      Home Medications    Prior to Admission medications   Medication Sig Start Date End Date Taking? Authorizing Provider  carbamazepine (EQUETRO) 200 MG CP12 12 hr capsule Take 2 capsules (400 mg total) by mouth 2 (two) times daily. 02/02/18   Marletta Lor, MD  levothyroxine (SYNTHROID, LEVOTHROID) 100 MCG tablet Take 1 tablet (100 mcg total) by mouth daily. 02/02/18   Marletta Lor, MD  Lurasidone HCl (LATUDA) 120 MG TABS Take 1 tablet (120 mg total) by mouth every evening. 02/02/18   Marletta Lor, MD  metoprolol tartrate (LOPRESSOR) 25 MG tablet Take 1 tablet (25 mg total) by mouth daily as needed (palpitations). 12/02/17   Marletta Lor, MD  QUEtiapine (SEROQUEL XR) 200 MG 24 hr tablet Take 1 tablet (200 mg total) by mouth at bedtime. 02/02/18   Marletta Lor, MD  SF 5000 PLUS 1.1 % CREA dental cream Place 1 application onto teeth at bedtime.  10/30/11   [provider]  simvastatin (ZOCOR) 40 MG tablet Take 1 tablet (40 mg total) by mouth at bedtime. 02/02/18   Marletta Lor, MD  Family History Family History  Problem Relation Age of Onset  . Dementia Mother   . Healthy Father        until death at age 57  . Mental illness Brother   . Healthy Sister     Social History Social History   Tobacco Use  . Smoking status: Former Smoker    Packs/day: 0.33    Years: 44.00    Pack years: 14.52    Types: Cigarettes  . Smokeless tobacco: Never Used  . Tobacco comment: 02/15/2013 "stopped smoking cigarettes ~ 7 yr ago"  Substance Use Topics  . Alcohol use: No  . Drug use: No     Allergies   Patient has no known allergies.   Review of  Systems Review of Systems  Constitutional: Positive for fatigue and fever. Negative for chills.  HENT: Negative for congestion and rhinorrhea.   Eyes: Negative for visual disturbance.  Respiratory: Negative for cough, shortness of breath and wheezing.   Cardiovascular: Negative for chest pain and leg swelling.  Gastrointestinal: Negative for abdominal pain, diarrhea, nausea and vomiting.  Genitourinary: Negative for dysuria and flank pain.  Musculoskeletal: Negative for neck pain and neck stiffness.  Skin: Negative for rash and wound.  Allergic/Immunologic: Negative for immunocompromised state.  Neurological: Positive for weakness. Negative for syncope and headaches.  All other systems reviewed and are negative.    Physical Exam Updated Vital Signs BP 121/65 (BP Location: Left Arm)   Pulse 62   Temp 98.1 F (36.7 C) (Oral)   SpO2 97%   Physical Exam  Constitutional: She is oriented to person, place, and time. She appears well-developed and well-nourished. No distress.  HENT:  Head: Normocephalic and atraumatic.  Markedly dry MM  Eyes: Conjunctivae are normal.  Neck: Neck supple.  Cardiovascular: Normal rate, regular rhythm and normal heart sounds. Exam reveals no friction rub.  No murmur heard. Pulmonary/Chest: Effort normal and breath sounds normal. No respiratory distress. She has no wheezes. She has no rales.  Abdominal: Soft. Bowel sounds are normal. She exhibits no distension. There is no tenderness. There is no guarding.  Musculoskeletal: She exhibits no edema.  Neurological: She is alert and oriented to person, place, and time. She exhibits normal muscle tone.  Skin: Skin is warm. Capillary refill takes less than 2 seconds.  Psychiatric: She has a normal mood and affect.  Nursing note and vitals reviewed.   Neurological Exam:  Mental Status: Alert and oriented to person, place, and time. Attention and concentration normal. Speech clear. Recent memory is  intact. Cranial Nerves: Visual fields grossly intact. EOMI and PERRLA. No nystagmus noted. Facial sensation intact at forehead, maxillary cheek, and chin/mandible bilaterally. No facial asymmetry or weakness. Hearing grossly normal. Uvula is midline, and palate elevates symmetrically. Normal SCM and trapezius strength. Tongue midline without fasciculations. Motor: Muscle strength 5/5 in proximal and distal UE and LE bilaterally. No pronator drift. Muscle tone normal. Reflexes: 2+ and symmetrical in all four extremities.  Sensation: Intact to light touch in upper and lower extremities distally bilaterally.  Gait: Normal without ataxia. Coordination: Normal FTN bilaterally.     ED Treatments / Results  Labs (all labs ordered are listed, but only abnormal results are displayed) Labs Reviewed  CBC WITH DIFFERENTIAL/PLATELET  COMPREHENSIVE METABOLIC PANEL  TROPONIN I  I-STAT CG4 LACTIC ACID, ED    EKG None  Radiology No results found.  Procedures Procedures (including critical care time)  Medications Ordered in ED Medications - No data to display  Initial Impression / Assessment and Plan / ED Course  I have reviewed the triage vital signs and the nursing notes.  Pertinent labs & imaging results that were available during my care of the patient were reviewed by me and considered in my medical decision making (see chart for details).     Pleasant 72 year old female here with dizziness upon standing and reported gait instability.  On arrival here, the patient is alert, oriented, with no focal neurological deficits.  She does have ketonuria as well as significant orthostasis.  Suspect this is contributing to her weakness and dizziness upon standing.  She was given fluids here with significant improvement.  With a walker, she is able to ambulate at her baseline around the ED without difficulty.  Otherwise, her lab work is very reassuring.  No apparent infectious process.  I did send a  Tegretol level given her recent admission but given her well appearance, she would like to return home which I think is reasonable.  Advised her to drink plenty of fluids, be careful when standing, and call her PCP and physical therapist.  Final Clinical Impressions(s) / ED Diagnoses   Final diagnoses:  None    ED Discharge Orders    None       Duffy Bruce, MD 02/18/18 1943

## 2018-02-18 NOTE — Telephone Encounter (Signed)
Patient has arrived at [facility].

## 2018-02-18 NOTE — ED Triage Notes (Signed)
Per EMS- Patient was at home receiving physical therapy and PT tech noted increased in her unsteady gait. And called EMS. Patient had a fall, but n oapparent injury, No head injury or No LOC. EMS reported a temp 101.8 tympanic, but triage temp 98.1 orally

## 2018-02-18 NOTE — Telephone Encounter (Signed)
Will monitor for ED arrival.  

## 2018-02-18 NOTE — Patient Outreach (Signed)
[facility]) Care Management   02/18/2018  [name] [birth year] [id number]  Rebecca Novak is an 72 y.o. female  Subjective: "I'm not feeling good".  Objective:  BP 104/68   Pulse 65   Resp 20   Ht 1.727 m ('5\' 8"' )   Wt 164 lb (74.4 kg)   SpO2 94%   BMI 24.94 kg/m   Review of Systems  Respiratory:       Lung sounds clear  Cardiovascular:       S1S2 noted, regular  Skin:       Right thumb with some swelling with bruised nailbed.    Physical Exam  Encounter Medications:   Outpatient Encounter Medications as of 02/18/2018  Medication Sig  . carbamazepine (EQUETRO) 200 MG CP12 12 hr capsule Take 2 capsules (400 mg total) by mouth 2 (two) times daily.  Marland Kitchen levothyroxine (SYNTHROID, LEVOTHROID) 100 MCG tablet Take 1 tablet (100 mcg total) by mouth daily.  . Lurasidone HCl (LATUDA) 120 MG TABS Take 1 tablet (120 mg total) by mouth every evening.  . metoprolol tartrate (LOPRESSOR) 25 MG tablet Take 1 tablet (25 mg total) by mouth daily as needed (palpitations).  . QUEtiapine (SEROQUEL XR) 200 MG 24 hr tablet Take 1 tablet (200 mg total) by mouth at bedtime.  . SF 5000 PLUS 1.1 % CREA dental cream Place 1 application onto teeth at bedtime.   . simvastatin (ZOCOR) 40 MG tablet Take 1 tablet (40 mg total) by mouth at bedtime.   No facility-administered encounter medications on file as of 02/18/2018.     Functional Status:   In your present state of health, do you have any difficulty performing the following activities: 02/18/2018 01/18/2018  Hearing? N N  Vision? N N  Difficulty concentrating or making decisions? N Y  Walking or climbing stairs? N Y  Dressing or bathing? N Y  Doing errands, shopping? N Y  Conservation officer, nature and eating ? N -  Using the Toilet? N -  In the past six months, have you accidently leaked urine? N -  Do you have problems with loss of bowel control? N -  Managing your Medications? N -  Managing your Finances? N -   Housekeeping or managing your Housekeeping? N -  Some recent data might be hidden    Fall/Depression Screening:    Fall Risk  02/18/2018 08/05/2017 04/30/2017  Falls in the past year? Yes No No  Comment - - [facility] Telephone Survey: data to providers prior to load  Number falls in past yr: 2 or more - -  Injury with Fall? No - -  Risk Factor Category  High Fall Risk - -  Risk for fall due to : History of fall(s);Impaired balance/gait - -   PHQ 2/9 Scores 02/04/2018 08/05/2017 06/26/2017 04/07/2016 02/07/2015 02/13/2014 12/22/2012  PHQ - 2 Score 0 0 0 0 0 0 0  PHQ- 9 Score - - 0 - - - -   Initial Home Visit today. See below:  Assessment:  Referral received from telephonic care coordinator for home assessment due to recent falls, assess care needs.  72 year old with history of hypothyroidism, recurrent falls, carbamazepine toxicity, bipolar disorder.   Client lives with significant other, Elnora Morrison (together over 30 years). Mr. Ulice Brilliant has health issues and is unable to provide care for client. [name] states she does not have any children. She has a sister, Raylene Carmickle, who is supportive.   Client reports feeling  nauseas for the past couple days. She denies vomiting. She reports she has eaten, but feels sick on the stomach. She states this has not happened before.  Medications reviewed. Client request pharmacy referral because the cost of her Anette Guarneri is $125.   h/o fall-Client denies dizziness today. She is being followed by [facility] home health for physical therapy and nursing. RNCM noted client's gait to be a little unsteady as she was walking to the kitchen. RNCM discussed fall prevention strategies with client and encouraged her to continue to work with physical therapist and follow up with recommended therapy.     Client's previous primary care provider has retired. Client states she does not know who will be following her. She states a new doctor will be in the office in October.  RNCM called office to follow up on primary care provider assigned and was informed that client needs to schedule a transfer of care appointment. Client's preference was to wait until the new provider arrived in November, therefore her appointment was scheduled for November 21 at 10 am. Client also instructed she could call the office with questions or to see another provider as needed before that appointment.  FALL in the Home-RNCM had completed home visit and client request newspaper from outside. RNCM went to get the paper and met Physical therapist, Wendee Copp, from [facility] home [facility] outside client's home. Upon entering the door, client noted to be on the floor due to fall. Her significant other, Mr. Elnora Morrison was in the chair adjacent to where she had fallen. [name] states she does not know what happened. Client was assisted up to stool (nearest seat) by physical therapist.Client denies any pain. Vital signs taken by physical therapist 100/70 HR 76 and 88/60 standing, oxygen saturation 99%.  Therapist asked RNCM to recheck blood pressure. Sitting client's blood pressure was 104/64 HR 77 oxygen saturation 95%. Orthostatics taken after about 3 minutes of standing- blood pressure 64/48 HR 94, client reported feeling some dizziness at this time. Client assisted back to seating position and blood pressure noted to be increase to 88/60 after one minute. Physical therapist states that client has had periods of shuffled gait and speech with some slurring noted. Physical therapist states this is fairly new and that client did not have a shuffled walk when she started with her approximately 3 weeks ago.   Primary care office called and spoke with Dorian Furnace. Informed of client's fall and blood pressure readings. Client remains oriented, alert. No signs of passing out. Smile symmetrical, hand grips equal, no drift noted, no lean noted while sitting. Some slurring of words noted. Per  recommendations of nurse, 911 called. Initially client was refusing to the emergecy room.   Client transferred to [facility].  Plan: [facility] liaison; pharmacy referral continue to follow.  THN CM Care Plan Problem One     Most Recent Value  Care Plan Problem One  fall risk  Role Documenting the Problem One  Care Management [place] for Problem One  Active  THN Long Term Goal   client will verbalize fall prevention strategies within the next 31 days.  THN Long Term Goal Start Date  02/18/18  Interventions for Problem One Long Term Goal  discussed clear walkway, working with physical therapist and following recommendations.  THN CM Short Term Goal #1   client will verbalize following speech therapist recommendations within the net 30 days.  THN CM Short Term Goal #2  client will verbalize contact with [place] within the next 30 days.  THN CM Short Term Goal #2 Start Date  02/18/18     Thea Silversmith, RN, MSN, [place] Coordinator Cell: [phone]

## 2018-02-18 NOTE — Discharge Instructions (Signed)
Drink AT LEAST 6-8 glasses of water daily for the next few days  Be VERY careful when standing  Call your primary doctor/physical therapist in the morning to set up a repeat exam and check-up soon, in the next few days

## 2018-02-19 ENCOUNTER — Telehealth: Payer: Self-pay | Admitting: Internal Medicine

## 2018-02-19 NOTE — Telephone Encounter (Signed)
FYI

## 2018-02-19 NOTE — Telephone Encounter (Signed)
Please advise 

## 2018-02-19 NOTE — Telephone Encounter (Signed)
Copied from [place] [id number]. Topic: General - Other >> Feb 19, 2018  8:33 AM Margot Ables wrote: Reason for CRM: Pt missed an OT visit this week. Pt was unavailable yesterday and this morning refused and asked to be scheduled for next week. Can missed visit be added to the end of plan of care? Please call. Secure VM ok to leave detailed message.

## 2018-02-19 NOTE — Telephone Encounter (Signed)
Spoke with Jinny Blossom and she stated that pt is really confused. I asked Jinny Blossom has she been taking her Rx since it played a role in her las confusing.  Jinny Blossom stated that the pt will not let her help her with anything and has not let her "touch her pill box" since 02/09/2018. Pt fell 02/18/2018 and has been admitted to the ER. Jinny Blossom stated that she will try on Monday to see if pt will be cooperative if not she will d/c her.

## 2018-02-19 NOTE — Progress Notes (Signed)
Ms. Stanforth has missed the first 2 PREP classes after getting registered earlier in the week.  I have attempted to call her home/cellphone w/o success.  Her sister, Jeanice Lim, has contacted me to let me know she hasn't been feeling great and had gone to the hospital due to a fall.  I will attempt to contact Ms. Rabenold to check on her.

## 2018-02-22 ENCOUNTER — Other Ambulatory Visit: Payer: Self-pay

## 2018-02-22 NOTE — Patient Outreach (Addendum)
[facility]) Care Management  02/22/2018  Rebecca Novak 11-Sep-1945 [id number]   Subjective: "I am doing good. Yes, I am ok. There is nothing wrong with me".   Assessment: 72 year old with history of hypothyroidism, recurrent falls, carbamazepine toxicity, bipolar disorder. Most recent fall 02/18/18 (assessed in the ED).  RNCM called to follow up. Ms. Rebecca Novak states she is better. She denies any dizziness, she denies any nausea. She reports she is walking without difficulty. She reports that home health physical therapist will be out this week.  RNCM reviewed after visit summary from emergency room visit, reinforced with client that [facility] pharmacist will be calling regarding medications. Reinforced after visit summary instructions.  She states she has not called for a follow up appointment. RNCM offered to make the appointment for her and client declined stating she would call herself.  RNCM encouraged Ms. Rebecca Novak to call RNCM as needed and reinforced the 24 hour nurse advice line.  Plan: home visit scheduled for next month. THN CM Care Plan Problem One     Most Recent Value  Care Plan Problem One  fall risk  Role Documenting the Problem One  Care Management [place] for Problem One  Active  THN Long Term Goal   client will verbalize fall prevention strategies within the next 31 days.  THN Long Term Goal Start Date  02/18/18  Interventions for Problem One Long Term Goal  RNCM discussed client's overall sense of well being, reviewed after visit summary instruction per ED, encouraged to continue to keep adequate hydration, discussed if client has need for outside assistance for food/nutrition.  THN CM Short Term Goal #1   client will verbalize following physical and/or occupational therapist recommendations within the net 30 days.  Interventions for Short Term Goal #1  RNCM confirmed with client that she has scheduled visits with therapist this upcoming  week.  THN CM Short Term Goal #2   client will verbalize contact with [place] within the next 30 days.  THN CM Short Term Goal #2 Start Date  02/18/18  Interventions for Short Term Goal #2  RNCM reinforced with client that she should expect a call from [facility] pharmacist regarding her medication.     Thea Silversmith, RN, MSN, [place] Coordinator Cell: [phone]

## 2018-02-22 NOTE — Telephone Encounter (Signed)
Spoke to [place] and she stated that the pt is wanting her to come later this week for OT. Sharyn Lull stated that every time she arrives at the pt house the pt refuses OT. Sharyn Lull stated that she will try again Wed. If pt refuses she will d/c her.   It tried calling pt home phone but no answer. VM left on answering machine.

## 2018-02-22 NOTE — Telephone Encounter (Signed)
Okay 

## 2018-02-23 ENCOUNTER — Encounter (HOSPITAL_COMMUNITY): Payer: Self-pay | Admitting: *Deleted

## 2018-02-23 ENCOUNTER — Observation Stay (HOSPITAL_COMMUNITY)
Admission: EM | Admit: 2018-02-23 | Discharge: 2018-02-25 | Disposition: A | Discharge status: Home / Self Care | Payer: Medicare Other | Attending: Family Medicine | Admitting: Family Medicine

## 2018-02-23 ENCOUNTER — Other Ambulatory Visit: Payer: Self-pay

## 2018-02-23 ENCOUNTER — Ambulatory Visit: Payer: Self-pay | Admitting: Pharmacist

## 2018-02-23 DIAGNOSIS — R410 Disorientation, unspecified: Secondary | ICD-10-CM | POA: Diagnosis not present

## 2018-02-23 DIAGNOSIS — R296 Repeated falls: Secondary | ICD-10-CM | POA: Diagnosis not present

## 2018-02-23 DIAGNOSIS — S8001XA Contusion of right knee, initial encounter: Secondary | ICD-10-CM | POA: Insufficient documentation

## 2018-02-23 DIAGNOSIS — Z87891 Personal history of nicotine dependence: Secondary | ICD-10-CM | POA: Diagnosis not present

## 2018-02-23 DIAGNOSIS — M6281 Muscle weakness (generalized): Secondary | ICD-10-CM | POA: Diagnosis not present

## 2018-02-23 DIAGNOSIS — W19XXXA Unspecified fall, initial encounter: Secondary | ICD-10-CM | POA: Insufficient documentation

## 2018-02-23 DIAGNOSIS — G92 Toxic encephalopathy: Secondary | ICD-10-CM | POA: Diagnosis not present

## 2018-02-23 DIAGNOSIS — E039 Hypothyroidism, unspecified: Secondary | ICD-10-CM | POA: Diagnosis not present

## 2018-02-23 DIAGNOSIS — Z85828 Personal history of other malignant neoplasm of skin: Secondary | ICD-10-CM | POA: Diagnosis not present

## 2018-02-23 DIAGNOSIS — T421X1A Poisoning by iminostilbenes, accidental (unintentional), initial encounter: Secondary | ICD-10-CM | POA: Diagnosis present

## 2018-02-23 DIAGNOSIS — S0083XD Contusion of other part of head, subsequent encounter: Secondary | ICD-10-CM | POA: Diagnosis not present

## 2018-02-23 DIAGNOSIS — R002 Palpitations: Secondary | ICD-10-CM | POA: Diagnosis not present

## 2018-02-23 DIAGNOSIS — D649 Anemia, unspecified: Secondary | ICD-10-CM | POA: Diagnosis not present

## 2018-02-23 DIAGNOSIS — T421X4D Poisoning by iminostilbenes, undetermined, subsequent encounter: Secondary | ICD-10-CM | POA: Diagnosis not present

## 2018-02-23 DIAGNOSIS — E785 Hyperlipidemia, unspecified: Secondary | ICD-10-CM | POA: Insufficient documentation

## 2018-02-23 DIAGNOSIS — F319 Bipolar disorder, unspecified: Secondary | ICD-10-CM | POA: Diagnosis present

## 2018-02-23 DIAGNOSIS — T421X5A Adverse effect of iminostilbenes, initial encounter: Secondary | ICD-10-CM | POA: Insufficient documentation

## 2018-02-23 DIAGNOSIS — I10 Essential (primary) hypertension: Secondary | ICD-10-CM | POA: Diagnosis not present

## 2018-02-23 DIAGNOSIS — S8002XA Contusion of left knee, initial encounter: Secondary | ICD-10-CM | POA: Insufficient documentation

## 2018-02-23 DIAGNOSIS — R270 Ataxia, unspecified: Secondary | ICD-10-CM | POA: Diagnosis not present

## 2018-02-23 DIAGNOSIS — R4781 Slurred speech: Secondary | ICD-10-CM | POA: Diagnosis not present

## 2018-02-23 DIAGNOSIS — R55 Syncope and collapse: Secondary | ICD-10-CM | POA: Diagnosis not present

## 2018-02-23 DIAGNOSIS — R0902 Hypoxemia: Secondary | ICD-10-CM | POA: Diagnosis not present

## 2018-02-23 DIAGNOSIS — R627 Adult failure to thrive: Secondary | ICD-10-CM | POA: Diagnosis not present

## 2018-02-23 DIAGNOSIS — R42 Dizziness and giddiness: Secondary | ICD-10-CM | POA: Diagnosis not present

## 2018-02-23 DIAGNOSIS — S51812A Laceration without foreign body of left forearm, initial encounter: Secondary | ICD-10-CM | POA: Diagnosis not present

## 2018-02-23 DIAGNOSIS — T421X4A Poisoning by iminostilbenes, undetermined, initial encounter: Secondary | ICD-10-CM

## 2018-02-23 LAB — COMPREHENSIVE METABOLIC PANEL
ALBUMIN: 3.4 g/dL — AB (ref 3.5–5.0)
ALT: 10 U/L (ref 0–44)
AST: 19 U/L (ref 15–41)
Alkaline Phosphatase: 65 U/L (ref 38–126)
Anion gap: 9 (ref 5–15)
BUN: 19 mg/dL (ref 8–23)
CHLORIDE: 111 mmol/L (ref 98–111)
CO2: 24 mmol/L (ref 22–32)
Calcium: 8.2 mg/dL — ABNORMAL LOW (ref 8.9–10.3)
Creatinine, Ser: 0.83 mg/dL (ref 0.44–1.00)
GFR calc Af Amer: >60 mL/min (ref >60)
GFR calc non Af Amer: >60 mL/min (ref >60)
Glucose, Bld: 115 mg/dL — ABNORMAL HIGH (ref 70–99)
Potassium: 3.5 mmol/L (ref 3.5–5.1)
SODIUM: 144 mmol/L (ref 135–145)
Total Bilirubin: 0.4 mg/dL (ref 0.3–1.2)
Total Protein: 6.1 g/dL — ABNORMAL LOW (ref 6.5–8.1)

## 2018-02-23 LAB — CBC
HEMATOCRIT: 33.7 % — AB (ref 36.0–46.0)
Hemoglobin: 10.9 g/dL — ABNORMAL LOW (ref 12.0–15.0)
MCH: 29.3 pg (ref 26.0–34.0)
MCHC: 32.3 g/dL (ref 30.0–36.0)
MCV: 90.6 fL (ref 78.0–100.0)
Platelets: 223 10*3/uL (ref 150–400)
RBC: 3.72 MIL/uL — ABNORMAL LOW (ref 3.87–5.11)
RDW: 14.4 % (ref 11.5–15.5)
WBC: 8.5 10*3/uL (ref 4.0–10.5)

## 2018-02-23 LAB — URINALYSIS, ROUTINE W REFLEX MICROSCOPIC
BILIRUBIN URINE: NEGATIVE
Glucose, UA: NEGATIVE mg/dL
HGB URINE DIPSTICK: NEGATIVE
KETONES UR: 5 mg/dL — AB
Leukocytes, UA: NEGATIVE
Nitrite: NEGATIVE
PH: 6 (ref 5.0–8.0)
Protein, ur: NEGATIVE mg/dL
Specific Gravity, Urine: 1.009 (ref 1.005–1.030)

## 2018-02-23 LAB — CBG MONITORING, ED: Glucose-Capillary: 114 mg/dL — ABNORMAL HIGH (ref 70–99)

## 2018-02-23 LAB — I-STAT TROPONIN, ED: Troponin i, poc: 0.03 ng/mL (ref 0.00–0.08)

## 2018-02-23 NOTE — ED Triage Notes (Signed)
Per EMS, pt from home, pt's sister reports behavioral problems.  Pt lives by herself.   Unknown if she is taking her meds as prescribed.  Pt fell today.  Abrasion noted on her R knee and skin tear L FA.  Pt reports she was dizzy and fell.

## 2018-02-23 NOTE — ED Provider Notes (Signed)
[facility] DEPT Provider Note   CSN: [id number] Arrival date & time: 02/23/18  1818     History   Chief Complaint No chief complaint on file.   HPI Rebecca Novak is a 72 y.o. female.  The history is provided by the patient and the EMS personnel. No language interpreter was used.    Rebecca Novak is a 72 y.o. female who presents to the Emergency Department complaining of fall. Level V caveat due to confusion. Patient states she presents to the emergency department for evaluation of fall. She is unable to describe the events, what happened, where it happened. She denies any pain. She states that she lives at home with Chrissie Noa. She states that he is currently a patient and he is unable to be contacted. She denies any headache, chest pain, shortness of breath, nausea, vomiting, dysuria, numbness, weakness. She denies any falls prior to today. Per triage note patient with history of behavioral problems and lives alone, had a fall today, unclear if she is taking meds as prescribed..  Past Medical History:  Diagnosis Date  . ANEMIA, MILD 05/17/2008  . COLONIC POLYPS, HX OF 05/17/2007  . Depression    bipolar  . GLUCOSE INTOLERANCE 11/23/2007  . HAND PAIN 07/31/2008  . HYPERLIPIDEMIA 02/05/2007  . HYPOTHYROIDISM 02/05/2007  . Skin cancer    "scraped off chest and RLE" (02/15/2013)  . SYSTOLIC MURMUR 81/05/7508    Patient Active Problem List   Diagnosis Date Noted  . 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 Recurrent falls 01/18/2018  . Acute encephalopathy 01/18/2018  . Bipolar disorder ([place]) 01/18/2018  . Palpitations 12/02/2017  . Syncope 10/26/2015  . Nausea & vomiting 02/15/2013  . Weakness of both legs 02/15/2013  . Orthostatic hypotension 02/15/2013  . HAND PAIN 07/31/2008  . Anemia 05/17/2008  . GLUCOSE INTOLERANCE 11/23/2007  . History of colonic polyps 05/17/2007  . Hypothyroidism 02/05/2007  .  Dyslipidemia 02/05/2007    Past Surgical History:  Procedure Laterality Date  . APPENDECTOMY    . TONSILLECTOMY    . VAGINAL HYSTERECTOMY       OB History   None      Home Medications    Prior to Admission medications   Medication Sig Start Date End Date Taking? Authorizing Provider  carbamazepine (CARBATROL) 200 MG 12 hr capsule Take 200 mg by mouth 2 (two) times daily.  02/09/18   [provider]  carbamazepine (EQUETRO) 200 MG CP12 12 hr capsule Take 2 capsules (400 mg total) by mouth 2 (two) times daily. 02/02/18   Marletta Lor, MD  levothyroxine (SYNTHROID, LEVOTHROID) 100 MCG tablet Take 1 tablet (100 mcg total) by mouth daily. 02/02/18   Marletta Lor, MD  Lurasidone HCl (LATUDA) 120 MG TABS Take 1 tablet (120 mg total) by mouth every evening. 02/02/18   Marletta Lor, MD  metoprolol tartrate (LOPRESSOR) 25 MG tablet Take 1 tablet (25 mg total) by mouth daily as needed (palpitations). 12/02/17   Marletta Lor, MD  QUEtiapine (SEROQUEL XR) 200 MG 24 hr tablet Take 1 tablet (200 mg total) by mouth at bedtime. 02/02/18   Marletta Lor, MD  QUEtiapine (SEROQUEL) 100 MG tablet Take 100 mg by mouth at bedtime. 02/15/18   [provider]  SF 5000 PLUS 1.1 % CREA dental cream Place 1 application onto teeth at bedtime.  10/30/11   [provider]  simvastatin (ZOCOR) 40 MG tablet Take 1 tablet (40  mg total) by mouth at bedtime. 02/02/18   Marletta Lor, MD    Family History Family History  Problem Relation Age of Onset  . Dementia Mother   . Healthy Father        until death at age 29  . Mental illness Brother   . Healthy Sister     Social History Social History   Tobacco Use  . Smoking status: Former Smoker    Packs/day: 0.33    Years: 44.00    Pack years: 14.52    Types: Cigarettes  . Smokeless tobacco: Never Used  . Tobacco comment: 02/15/2013 "stopped smoking cigarettes ~ 7 yr ago"  Substance Use Topics    . Alcohol use: No  . Drug use: No     Allergies   Patient has no known allergies.   Review of Systems Review of Systems  All other systems reviewed and are negative.    Physical Exam Updated Vital Signs BP (!) 151/97 (BP Location: Right Arm)   Pulse 70   Temp 98.3 F (36.8 C) (Oral)   Resp 14   Ht 5\' 8"  (1.727 m)   Wt 75.3 kg   SpO2 96%   BMI 25.24 kg/m   Physical Exam  Constitutional: She is oriented to person, place, and time. She appears well-developed and well-nourished.  HENT:  Head: Normocephalic and atraumatic.  Cardiovascular: Normal rate and regular rhythm.  No murmur heard. Pulmonary/Chest: Effort normal and breath sounds normal. No respiratory distress.  Abdominal: Soft. There is no tenderness. There is no rebound and no guarding.  Musculoskeletal: She exhibits no edema or tenderness.  Small skin tear to the left forearm.  Neurological: She is alert and oriented to person, place, and time.  Five out of five strength in all four extremities with sensation to light touch intact in all four extremities. Mildly confused.  Skin: Skin is warm and dry.  Psychiatric: She has a normal mood and affect. Her behavior is normal.  Nursing note and vitals reviewed.    ED Treatments / Results  Labs (all labs ordered are listed, but only abnormal results are displayed) Labs Reviewed  CBG MONITORING, ED - Abnormal; Notable for the following components:      Result Value   Glucose-Capillary 114 (*)    All other components within normal limits  BASIC METABOLIC PANEL  CBC  URINALYSIS, ROUTINE W REFLEX MICROSCOPIC    EKG EKG Interpretation  Date/Time:  Tuesday February 23 2018 18:32:45 EDT Ventricular Rate:  74 PR Interval:    QRS Duration: 104 QT Interval:  377 QTC Calculation: 419 R Axis:   36 Text Interpretation:  Sinus rhythm Probable left atrial enlargement Confirmed by Quintella Reichert [phone]) on 02/23/2018 7:58:45 PM   Radiology No results  found.  Procedures Procedures (including critical care time)  Medications Ordered in ED Medications - No data to display   Initial Impression / Assessment and Plan / ED Course  I have reviewed the triage vital signs and the nursing notes.  Pertinent labs & imaging results that were available during my care of the patient were reviewed by me and considered in my medical decision making (see chart for details).     initial history available from patient's sister, [name] after initial ED evaluation. Discussed with sister, [name] at [phone]  Sister states that patient has been slightly more confused, falling more frequently lately. Sister states that the patient administers her medications and it is not clear how much she is  taking and what she is taking. Recent behavior with increased confusion and gait difficulties is similar to presentation one month ago when patient was found to be toxic with carbamazepine.  She is nontoxic appearing on examination. On attempting to ambulate she feels unsteady and has a narrow, shuffling gait. She does have elevated carbamazepine level today, plan to admit for observation and reassessment.  Final Clinical Impressions(s) / ED Diagnoses   Final diagnoses:  None    ED Discharge Orders    None       Quintella Reichert, MD 02/24/18 [phone]

## 2018-02-23 NOTE — Telephone Encounter (Signed)
Spoke to [place] and she was advise that pt is still wanting OT. Sharyn Lull stated that the pt is constantly changing her availability and not participating. Sharyn Lull stated she will call the pt tonight and give her one last chance. Pt will be d/c if she refuses again since this will be her 3rd refusal.

## 2018-02-23 NOTE — Telephone Encounter (Signed)
Spoke to pt and she stated that no one has tried to come and see her for OT. Pt stated that she is willing to continue the therapy however, pt is wanting the therapist to come Friday instead of tomorrow. Tried to call OT Sharyn Lull but no answer. VM was left.

## 2018-02-24 ENCOUNTER — Telehealth: Payer: Self-pay | Admitting: Psychiatry

## 2018-02-24 ENCOUNTER — Other Ambulatory Visit: Payer: Self-pay

## 2018-02-24 ENCOUNTER — Telehealth: Payer: Self-pay | Admitting: Internal Medicine

## 2018-02-24 ENCOUNTER — Other Ambulatory Visit: Payer: Self-pay | Admitting: Pharmacist

## 2018-02-24 ENCOUNTER — Encounter (HOSPITAL_COMMUNITY): Payer: Self-pay | Admitting: Internal Medicine

## 2018-02-24 DIAGNOSIS — D649 Anemia, unspecified: Secondary | ICD-10-CM | POA: Diagnosis not present

## 2018-02-24 DIAGNOSIS — G92 Toxic encephalopathy: Secondary | ICD-10-CM | POA: Diagnosis not present

## 2018-02-24 DIAGNOSIS — T421X1A Poisoning by iminostilbenes, accidental (unintentional), initial encounter: Secondary | ICD-10-CM | POA: Diagnosis not present

## 2018-02-24 LAB — HEPATIC FUNCTION PANEL
ALK PHOS: 62 U/L (ref 38–126)
ALT: 10 U/L (ref 0–44)
AST: 21 U/L (ref 15–41)
Albumin: 3.3 g/dL — ABNORMAL LOW (ref 3.5–5.0)
BILIRUBIN DIRECT: 0.2 mg/dL (ref 0.0–0.2)
BILIRUBIN INDIRECT: 0.6 mg/dL (ref 0.3–0.9)
Total Bilirubin: 0.8 mg/dL (ref 0.3–1.2)
Total Protein: 5.8 g/dL — ABNORMAL LOW (ref 6.5–8.1)

## 2018-02-24 LAB — CBC WITH DIFFERENTIAL/PLATELET
BASOS PCT: 0 %
Basophils Absolute: 0 10*3/uL (ref 0.0–0.1)
EOS ABS: 0.1 10*3/uL (ref 0.0–0.7)
Eosinophils Relative: 1 %
HCT: 33.4 % — ABNORMAL LOW (ref 36.0–46.0)
Hemoglobin: 10.8 g/dL — ABNORMAL LOW (ref 12.0–15.0)
Lymphocytes Relative: 29 %
Lymphs Abs: 1.7 10*3/uL (ref 0.7–4.0)
MCH: 29.2 pg (ref 26.0–34.0)
MCHC: 32.3 g/dL (ref 30.0–36.0)
MCV: 90.3 fL (ref 78.0–100.0)
MONO ABS: 0.7 10*3/uL (ref 0.1–1.0)
Monocytes Relative: 12 %
Neutro Abs: 3.5 10*3/uL (ref 1.7–7.7)
Neutrophils Relative %: 58 %
Platelets: 232 10*3/uL (ref 150–400)
RBC: 3.7 MIL/uL — ABNORMAL LOW (ref 3.87–5.11)
RDW: 14.4 % (ref 11.5–15.5)
WBC: 6 10*3/uL (ref 4.0–10.5)

## 2018-02-24 LAB — CK: Total CK: 117 U/L (ref 38–234)

## 2018-02-24 LAB — BASIC METABOLIC PANEL
Anion gap: 8 (ref 5–15)
BUN: 18 mg/dL (ref 8–23)
CALCIUM: 8.6 mg/dL — AB (ref 8.9–10.3)
CO2: 26 mmol/L (ref 22–32)
CREATININE: 0.88 mg/dL (ref 0.44–1.00)
Chloride: 111 mmol/L (ref 98–111)
GFR calc Af Amer: >60 mL/min (ref >60)
GFR calc non Af Amer: >60 mL/min (ref >60)
GLUCOSE: 101 mg/dL — AB (ref 70–99)
Potassium: 3.5 mmol/L (ref 3.5–5.1)
SODIUM: 145 mmol/L (ref 135–145)

## 2018-02-24 LAB — SALICYLATE LEVEL: Salicylate Lvl: <7 mg/dL (ref 2.8–30.0)

## 2018-02-24 LAB — RAPID URINE DRUG SCREEN, HOSP PERFORMED
AMPHETAMINES: NOT DETECTED
Barbiturates: NOT DETECTED
Benzodiazepines: NOT DETECTED
Cocaine: NOT DETECTED
OPIATES: NOT DETECTED
TETRAHYDROCANNABINOL: NOT DETECTED

## 2018-02-24 LAB — VITAMIN B12: Vitamin B-12: 196 pg/mL (ref 180–914)

## 2018-02-24 LAB — IRON AND TIBC
Iron: 91 ug/dL (ref 28–170)
Saturation Ratios: 28 % (ref 10.4–31.8)
TIBC: 330 ug/dL (ref 250–450)
UIBC: 239 ug/dL

## 2018-02-24 LAB — CARBAMAZEPINE LEVEL, TOTAL: Carbamazepine Lvl: 13.9 ug/mL — ABNORMAL HIGH (ref 4.0–12.0)

## 2018-02-24 LAB — TSH: TSH: 0.072 u[IU]/mL — AB (ref 0.350–4.500)

## 2018-02-24 LAB — RETICULOCYTES
RBC.: 3.7 MIL/uL — AB (ref 3.87–5.11)
RETIC COUNT ABSOLUTE: 37 10*3/uL (ref 19.0–186.0)
RETIC CT PCT: 1 % (ref 0.4–3.1)

## 2018-02-24 LAB — MAGNESIUM: Magnesium: 2.2 mg/dL (ref 1.7–2.4)

## 2018-02-24 LAB — FERRITIN: FERRITIN: 34 ng/mL (ref 11–307)

## 2018-02-24 LAB — FOLATE: Folate: 11.8 ng/mL (ref >5.9)

## 2018-02-24 LAB — ACETAMINOPHEN LEVEL: Acetaminophen (Tylenol), Serum: <10 ug/mL — ABNORMAL LOW (ref 10–30)

## 2018-02-24 MED ORDER — ACETAMINOPHEN 325 MG PO TABS: 650.0000 mg | ORAL_TABLET | Freq: Four times a day (QID) | ORAL | Status: DC | PRN

## 2018-02-24 MED ORDER — LURASIDONE HCL 40 MG PO TABS
120.0000 mg | ORAL_TABLET | Freq: Every evening | ORAL | Status: DC
  Administered 2018-02-24: 120 mg via ORAL
  Filled 2018-02-24 (×2): qty 3

## 2018-02-24 MED ORDER — SODIUM CHLORIDE 0.9 % IV SOLN
INTRAVENOUS | Status: AC
  Administered 2018-02-24 (×2): via INTRAVENOUS

## 2018-02-24 MED ORDER — ENOXAPARIN SODIUM 40 MG/0.4ML ~~LOC~~ SOLN
40.0000 mg | Freq: Every day | SUBCUTANEOUS | Status: DC
  Administered 2018-02-25: 40 mg via SUBCUTANEOUS
  Filled 2018-02-24 (×2): qty 0.4

## 2018-02-24 MED ORDER — SIMVASTATIN 40 MG PO TABS
40.0000 mg | ORAL_TABLET | Freq: Every day | ORAL | Status: DC
  Administered 2018-02-24: 40 mg via ORAL
  Filled 2018-02-24: qty 1

## 2018-02-24 MED ORDER — LEVOTHYROXINE SODIUM 100 MCG PO TABS: 100.0000 ug | ORAL_TABLET | Freq: Every day | ORAL | Status: DC

## 2018-02-24 MED ORDER — ACETAMINOPHEN 650 MG RE SUPP: 650.0000 mg | Freq: Four times a day (QID) | RECTAL | Status: DC | PRN

## 2018-02-24 MED ORDER — ONDANSETRON HCL 4 MG/2ML IJ SOLN: 4.0000 mg | Freq: Four times a day (QID) | INTRAMUSCULAR | Status: DC | PRN

## 2018-02-24 MED ORDER — TRAZODONE HCL 50 MG PO TABS
25.0000 mg | ORAL_TABLET | Freq: Once | ORAL | Status: AC
  Administered 2018-02-24: 25 mg via ORAL
  Filled 2018-02-24: qty 1

## 2018-02-24 MED ORDER — ORAL CARE MOUTH RINSE
15.0000 mL | Freq: Two times a day (BID) | OROMUCOSAL | Status: DC
  Administered 2018-02-24 – 2018-02-25 (×2): 15 mL via OROMUCOSAL

## 2018-02-24 MED ORDER — ONDANSETRON HCL 4 MG PO TABS: 4.0000 mg | ORAL_TABLET | Freq: Four times a day (QID) | ORAL | Status: DC | PRN

## 2018-02-24 MED ORDER — QUETIAPINE FUMARATE ER 200 MG PO TB24
200.0000 mg | ORAL_TABLET | Freq: Every day | ORAL | Status: DC
  Administered 2018-02-24: 200 mg via ORAL
  Filled 2018-02-24: qty 1

## 2018-02-24 NOTE — Telephone Encounter (Signed)
Spoke to [name]( pt sister) and she stated that the pt is in the hospital and are being discharged 02/25/2018 to [facility].

## 2018-02-24 NOTE — Telephone Encounter (Signed)
Noted. FYI 

## 2018-02-24 NOTE — ED Notes (Signed)
Patient ambulated to BR with x1 assist.

## 2018-02-24 NOTE — Patient Outreach (Signed)
Rebecca Novak) Care Management  02/24/2018  Rebecca Novak 10-18-1945 [id number]  72 yoF referred to [facility] for medication reconciliation Successful outreach call to Rebecca Novak on 02/23/18 with HIPAA identifiers verified. Patient was unable to carry on and discuss medications.  She seemed very disoriented, however I am unsure of her baseline.  I attempted to discuss medications, but realized this would be unsuccessful.  The patient was unable to fully engage and said it was a bad time. I asked her if I could call back and she stated tomorrow would be better. I will try to schedule a home visit if needed by the end of the week.   PLAN: -I will follow up with patient within 4 business days unless call returned sooner.  Regina Eck, PharmD, [place]  [phone]

## 2018-02-24 NOTE — Telephone Encounter (Signed)
Copied from [place] [id number]. Topic: Quick Communication - See Telephone Encounter >> Feb 24, 2018  1:39 PM Margot Ables wrote: CRM for notification. See Telephone encounter for: 02/24/18. HH has had pt on service for over a month. Last visit was 02/09/18. Pt has declined visits repeatedly for PT, OT, and RN. Megan went yesterday and pt opened the door but told her "I cannot do this today." The pt seems to be confused. The pts sister was notified by [facility] and the sister had EMS go to the home. Pt stated to her sister that [facility] did not come out. Jinny Blossom is worried about increasing confusion and declined nursing visits since 02/09/18. There was concern about pt taking/not taking medications correctly. Plan is to discharge pt from [facility] services. Please advise.

## 2018-02-24 NOTE — Telephone Encounter (Signed)
Dr. Clovis Pu given information and chart.

## 2018-02-24 NOTE — Telephone Encounter (Signed)
Dr. Bonner Puna stated pt came in office with falls and confusion levels of carbamazepin were high 13.9 Dr. Bonner Puna would like a call back at [phone]

## 2018-02-24 NOTE — Progress Notes (Signed)
Report received from Chancy Hurter, RN.  Assessment unchanged. Andre Lefort

## 2018-02-24 NOTE — ED Notes (Addendum)
Pt ambulated to bathroom with assistance. Lost balance upon turning around but was able to maneuver with assistance. Pt used a shuffling gait

## 2018-02-24 NOTE — Telephone Encounter (Signed)
Brooke from Home health had a missed visit with patient. Pt sister states she is in the hospital [phone]

## 2018-02-24 NOTE — Progress Notes (Signed)
PROGRESS NOTE  Brief Narrative: Rebecca Novak is a 72 y.o. female with a history of bipolar disorder and recent admission for carbamazepine toxicity who presented by EMS for frequent falls and confusion. Carbamazepine level was found to be elevated and patient unsteady on her feet. She was placed in observation this morning by Dr. Hal Hope.   Subjective: Confusion is improved but still is more confused than baseline. Her sister tells me by phone that she's had a downward trajectory over the past 3 months since her partner has gotten more ill and has been unable to cook for her. Since that time she's had 30 lbs weight loss, increasingly irregular sleep pattern and intermittent confusion. She is usually able to give a recent history of events per her sister, but is unable to tell me about her living situation this morning. Needed assistance to get to bathroom earlier this morning as well.   Objective: BP 121/72 (BP Location: Right Arm)   Pulse 74   Temp 98.6 F (37 C) (Oral)   Resp 20   Ht 5\' 8"  (1.727 m)   Wt 75.3 kg   SpO2 93%   BMI 25.24 kg/m   Gen: Nontoxic, elderly female Pulm: Clear and nonlabored on room air  CV: RRR, no murmur, no JVD, no edema GI: Soft, NT, ND, +BS  Neuro: Alert, conversant without aphasia. Oriented to person, place. Limited insight into situation and oriented to year only. Skin: Shallow abrasions on anterior legs/knees. Small skin tear on forearm.  Assessment & Plan: Frequent falls, failure to thrive:  - PT consulted. Discussed need for home health services to assist her in the home, though she has declined this in the past. Her sister is going to assist with this more aggressively than before.  Acute toxic encephalopathy: Based on slight improvement since admission, suspect this is due to toxicity as below.  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 No reported dosing changes, so possibly need to decrease dose in setting of  weight loss.  - Hold for now, continue hydration, recheck level in AM - Called pt's psychiatrist to discuss, left message. Will await call back to discuss dosing going forward.   Patrecia Pour, MD 02/24/2018, 2:22 PM

## 2018-02-24 NOTE — Care Management Note (Signed)
Case Management Note  CM noted pt was active with [facility] and [facility].  Contacted both agencies who are aware of pt being in the hospital and will follow for needs.  Dyane Broberg, Benjaman Lobe, RN 02/24/2018, 10:40 AM

## 2018-02-24 NOTE — Evaluation (Signed)
Physical Therapy Evaluation Patient Details Name: Rebecca Novak MRN: [id number] DOB: 07/18/1945 Today's Date: 02/24/2018   History of Present Illness  72 y.o. female with history of bipolar disorder and hypothyroidism was recently admitted in August 2019 2 months ago with confusion and falls at this time patient was found to have carbamazepine toxicity presents to the ER with complaints of having fallen and also was found to be confused.   Clinical Impression  Pt admitted with above diagnosis. Pt currently with functional limitations due to the deficits listed below (see PT Problem List). Pt ambulated 180' without an assistive device. She ambulates with decreased step length, she had no loss of balance while walking. Further balance assessment to be completed next session, this session limited due to pt preparing to transfer to floor from ED. Pt mildly confused (oriented to self, location and year, but not month) and poor historian (she denied h/o falls). Pt will benefit from skilled PT to increase their independence and safety with mobility to allow discharge to the venue listed below.       Follow Up Recommendations No PT follow up    Equipment Recommendations  None recommended by PT    Recommendations for Other Services       Precautions / Restrictions Precautions Precautions: Fall Precaution Comments: fall just prior to admission; per chart pt had several falls prior to recent admission in August, pt denies h/o falls Restrictions Weight Bearing Restrictions: No      Mobility  Bed Mobility Overal bed mobility: Modified Independent             General bed mobility comments: HOB up, used rail  Transfers Overall transfer level: Needs assistance Equipment used: None Transfers: Sit to/from Stand Sit to Stand: Min guard         General transfer comment: steady, no loss of balance  Ambulation/Gait Ambulation/Gait assistance: Min guard Gait Distance (Feet): 180  Feet Assistive device: None Gait Pattern/deviations: Step-through pattern;Decreased stride length Gait velocity: WNL   General Gait Details: no loss of balance with head turns while walking  Stairs            Wheelchair Mobility    Modified Rankin (Stroke Patients Only)       Balance Overall balance assessment: Mild deficits observed, not formally tested;History of Falls(balance not formally tested 2* pt about to transfer from ED to floor, will assess next visit)                                           Pertinent Vitals/Pain Pain Assessment: No/denies pain    Home Living Family/patient expects to be discharged to:: Private residence Living Arrangements: Spouse/significant other Available Help at Discharge: Available 24 hours/day Type of Home: House Home Access: Stairs to enter   [employer] of Steps: 2 Home Layout: One level Home Equipment: None Additional Comments: per previous admission, pt poor historian    Prior Function Level of Independence: Independent               Hand Dominance   Dominant Hand: Right    Extremity/Trunk Assessment   Upper Extremity Assessment Upper Extremity Assessment: Overall WFL for tasks assessed    Lower Extremity Assessment Lower Extremity Assessment: Overall WFL for tasks assessed    Cervical / Trunk Assessment Cervical / Trunk Assessment: Normal  Communication   Communication: No difficulties  Cognition  Arousal/Alertness: Awake/alert Behavior During Therapy: WFL for tasks assessed/performed Overall Cognitive Status: No family/caregiver present to determine baseline cognitive functioning                                 General Comments: pt oriented to self and location, correctly stated year but stated month is September, denied h/o falls but there are multiple falls documented in chart with this admission and admission in August      General Comments       Exercises     Assessment/Plan    PT Assessment Patient needs continued PT services  PT Problem List Decreased balance       PT Treatment Interventions Balance training;Gait training;Therapeutic activities    PT Goals (Current goals can be found in the Care Plan section)  Acute Rehab PT Goals Patient Stated Goal: return home PT Goal Formulation: With patient Time For Goal Achievement: 03/10/18 Potential to Achieve Goals: Good    Frequency Min 3X/week   Barriers to discharge        Co-evaluation               AM-PAC PT "6 Clicks" Daily Activity  Outcome Measure Difficulty turning over in bed (including adjusting bedclothes, sheets and blankets)?: A Little Difficulty moving from lying on back to sitting on the side of the bed? : A Little Difficulty sitting down on and standing up from a chair with arms (e.g., wheelchair, bedside commode, etc,.)?: A Little Help needed moving to and from a bed to chair (including a wheelchair)?: A Little Help needed walking in hospital room?: A Little Help needed climbing 3-5 steps with a railing? : A Little 6 Click Score: 18    End of Session Equipment Utilized During Treatment: Gait belt Activity Tolerance: Patient tolerated treatment well Patient left: in bed;with call bell/phone within reach Nurse Communication: Mobility status PT Visit Diagnosis: History of falling (Z91.81)    Time: 6599-3570 PT Time Calculation (min) (ACUTE ONLY): 10 min   Charges:   PT Evaluation $PT Eval Low Complexity: 1 Low          Philomena Doheny PT 02/24/2018  Acute Rehabilitation Services Pager [phone] Office [phone]

## 2018-02-24 NOTE — H&P (Addendum)
History and Physical    Rebecca Novak YDX:[id number] DOB: [birth year] DOA: 02/23/2018  PCP: Marletta Lor, MD  Patient coming from: Home.  Chief Complaint: Fall.  HPI: Rebecca Novak is a 72 y.o. female with history of bipolar disorder and hypothyroidism was recently admitted in August 2019 2 months ago with confusion and falls at this time patient was found to have carbamazepine toxicity presents to the ER with complaints of having fallen and also was found to be confused.  Patient denies hitting her head or losing consciousness denies any chest pain shortness of breath palpitation headache visual symptoms nausea vomiting or diarrhea.  Patient states she has been taking her medication as advised.  During last admission there was some concern about patient taking her medications are the right way.  After the fall patient bruised both knees but is able to move both lower extremities without any pain.  No head trauma.  She discussed with the neighbor and the neighbor called EMS.  Patient has been is also admitted in the hospital.  ED Course: In the ER EKG shows normal sinus rhythm.  Carbamazepine level was 13.9.  Initially patient was appearing confused at the time of my exam and patient is alert awake oriented to time place and person.  Patient states she will still has difficulty walking and has ataxia.  On exam patient does not have any nystagmus or any focal deficits otherwise.  Review of Systems: As per HPI, rest all negative.   Past Medical History:  Diagnosis Date  . ANEMIA, MILD 05/17/2008  . COLONIC POLYPS, HX OF 05/17/2007  . Depression    bipolar  . GLUCOSE INTOLERANCE 11/23/2007  . HAND PAIN 07/31/2008  . HYPERLIPIDEMIA 02/05/2007  . HYPOTHYROIDISM 02/05/2007  . Skin cancer    "scraped off chest and RLE" (02/15/2013)  . SYSTOLIC MURMUR 72/01/4708    Past Surgical History:  Procedure Laterality Date  . APPENDECTOMY    . TONSILLECTOMY    . VAGINAL  HYSTERECTOMY       reports that she has quit smoking. Her smoking use included cigarettes. She has a 14.52 pack-year smoking history. She has never used smokeless tobacco. She reports that she does not drink alcohol or use drugs.  No Known Allergies  Family History  Problem Relation Age of Onset  . Dementia Mother   . Healthy Father        until death at age 58  . Mental illness Brother   . Healthy Sister     Prior to Admission medications   Medication Sig Start Date End Date Taking? Authorizing Provider  carbamazepine (EQUETRO) 200 MG CP12 12 hr capsule Take 2 capsules (400 mg total) by mouth 2 (two) times daily. 02/02/18  Yes Marletta Lor, MD  levothyroxine (SYNTHROID, LEVOTHROID) 100 MCG tablet Take 1 tablet (100 mcg total) by mouth daily. 02/02/18  Yes Marletta Lor, MD  Lurasidone HCl (LATUDA) 120 MG TABS Take 1 tablet (120 mg total) by mouth every evening. 02/02/18  Yes Marletta Lor, MD  QUEtiapine (SEROQUEL XR) 200 MG 24 hr tablet Take 1 tablet (200 mg total) by mouth at bedtime. 02/02/18  Yes Marletta Lor, MD  simvastatin (ZOCOR) 40 MG tablet Take 1 tablet (40 mg total) by mouth at bedtime. 02/02/18  Yes Marletta Lor, MD  metoprolol tartrate (LOPRESSOR) 25 MG tablet Take 1 tablet (25 mg total) by mouth daily as needed (palpitations). 12/02/17   Marletta Lor, MD  Physical Exam: Vitals:   02/24/18 0422 02/24/18 0430 02/24/18 0445 02/24/18 0500  BP: 127/66 126/67  (!) 149/79  Pulse: 69   79  Resp: 18 17 15 20   Temp:      TempSrc:      SpO2: 98% 98% 96% 98%  Weight:      Height:          Constitutional: Moderately built and nourished. Vitals:   02/24/18 0422 02/24/18 0430 02/24/18 0445 02/24/18 0500  BP: 127/66 126/67  (!) 149/79  Pulse: 69   79  Resp: 18 17 15 20   Temp:      TempSrc:      SpO2: 98% 98% 96% 98%  Weight:      Height:       Eyes: Anicteric no pallor. ENMT: No discharge from the ears eyes nose or  mouth. Neck: No mass or.  No JVD appreciated. Respiratory: No rhonchi or crepitations. Cardiovascular: S1-S2 heard no murmurs appreciated. Abdomen: Soft nontender bowel sounds present.  No guarding or rigidity. Musculoskeletal: No edema.  No joint effusion as bruise in both knees. Skin: Bruise on both knees. Neurologic: Alert awake oriented to time place and person.  Moves all extremities. Psychiatric: Appears normal.   Labs on Admission: I have personally reviewed following labs and imaging studies  CBC: Recent Labs  Lab 02/18/18 1648 02/23/18 1828  WBC 7.4 8.5  NEUTROABS 6.1  --   HGB 13.0 10.9*  HCT 40.0 33.7*  MCV 90.9 90.6  PLT 241 779   Basic Metabolic Panel: Recent Labs  Lab 02/18/18 1648 02/23/18 2009  NA 141 144  K 3.9 3.5  CL 105 111  CO2 26 24  GLUCOSE 110* 115*  BUN 25* 19  CREATININE 1.03* 0.83  CALCIUM 8.9 8.2*   GFR: Estimated Creatinine Clearance: 60 mL/min (by C-G formula based on SCr of 0.83 mg/dL). Liver Function Tests: Recent Labs  Lab 02/18/18 1648 02/23/18 2009  AST 19 19  ALT 13 10  ALKPHOS 78 65  BILITOT 0.4 0.4  PROT 7.1 6.1*  ALBUMIN 4.1 3.4*   No results for input(s): LIPASE, AMYLASE in the last 168 hours. No results for input(s): AMMONIA in the last 168 hours. Coagulation Profile: No results for input(s): INR, PROTIME in the last 168 hours. Cardiac Enzymes: Recent Labs  Lab 02/18/18 1648  TROPONINI <0.03   BNP (last 3 results) No results for input(s): PROBNP in the last 8760 hours. HbA1C: No results for input(s): HGBA1C in the last 72 hours. CBG: Recent Labs  Lab 02/23/18 1929  GLUCAP 114*   Lipid Profile: No results for input(s): CHOL, HDL, LDLCALC, TRIG, CHOLHDL, LDLDIRECT in the last 72 hours. Thyroid Function Tests: No results for input(s): TSH, T4TOTAL, FREET4, T3FREE, THYROIDAB in the last 72 hours. Anemia Panel: No results for input(s): VITAMINB12, FOLATE, FERRITIN, TIBC, IRON, RETICCTPCT in the last 72  hours. Urine analysis:    Component Value Date/Time   COLORURINE YELLOW 02/23/2018 2256   APPEARANCEUR CLEAR 02/23/2018 2256   LABSPEC 1.009 02/23/2018 2256   PHURINE 6.0 02/23/2018 2256   GLUCOSEU NEGATIVE 02/23/2018 2256   HGBUR NEGATIVE 02/23/2018 2256   HGBUR negative 11/01/2008 0856   BILIRUBINUR NEGATIVE 02/23/2018 2256   BILIRUBINUR n 02/02/2015 1425   KETONESUR 5 (A) 02/23/2018 2256   PROTEINUR NEGATIVE 02/23/2018 2256   UROBILINOGEN 0.2 02/02/2015 1425   UROBILINOGEN 1.0 02/14/2013 1824   NITRITE NEGATIVE 02/23/2018 2256   LEUKOCYTESUR NEGATIVE 02/23/2018 2256   Sepsis Labs: @  LABRCNTIP(procalcitonin:4,lacticidven:4) )No results found for this or any previous visit (from the past 240 hour(s)).   Radiological Exams on Admission: No results found.  EKG: Independently reviewed.  Normal sinus rhythm.  Assessment/Plan Principal Problem:   Carbamazepine toxicity Active Problems:   Hypothyroidism   Palpitations   Bipolar disorder (HCC)   Normocytic normochromic anemia    1. Carbamazepine toxicity inadvertently -we will hold carbamazepine for now.  Gently hydrate get physical therapy consult.  Recheck carbamazepine level later in the day.  May discuss with patient's psychiatrist in the morning with regarding to further dosing of carbamazepine. 2. History of bipolar disorder holding off carbamazepine due to toxicity.  On Seroquel. 3. Hypothyroidism on Synthroid. 4. Normocytic normochromic anemia -follow CBC check anemia panel but anemia appears to be chronic. 5. Hyperlipidemia on statins.   DVT prophylaxis: Lovenox. Code Status: Full code. Family Communication: Discussed with patient. Disposition Plan: Home. Consults called: None. Admission status: Observation.   Rise Patience MD [facility] Pager [phone].  If 7PM-7AM, please contact night-coverage [email or web address] Password [facility]  02/24/2018, 5:34 AM

## 2018-02-24 NOTE — Care Management Note (Signed)
Case Management Note  Patient Details  Name: Rebecca Novak MRN: [id number] Date of Birth: 06-Nov-1945  Subjective/Objective:Active w[facility], & Active w/[facility] rep aware.                    Action/Plan:dc plan HHC   Expected Discharge Date:  (unknown)               Expected Discharge Plan:  [place]  In-House Referral:     Discharge planning Services  CM Consult  Post Acute Care Choice:  Home Health(Active w/[facility]) Choice offered to:     DME Arranged:    DME Agency:     HH Arranged:    HH Agency:     Status of Service:  In process, will continue to follow  If discussed at Long Length of Stay Meetings, dates discussed:    Additional Comments:  [name], RN 02/24/2018, 12:45 PM

## 2018-02-24 NOTE — Progress Notes (Signed)
   02/24/18 1549  Clinical Encounter Type  Visited With Patient  Visit Type Initial  Referral From Family  Consult/Referral To Chaplain  Chaplain responded to Pt.'s significant other's request, who is also a patient on the same floor, for a visit. Chaplain verified patient relationship with Pt. Pt. greeted chaplain with a big smile and shared the events that brought her to the hospital.  The Chaplain explained the role of a chaplain to the Pt. The Pt. shared her relationship with a church in [place] and indicated spiritual support from her congregation. The Pt. shared with the chaplain she has a sister in [place] that will help her with health care. The Pt. thanked the chaplain for the visit and shared with the chaplain she hopes to get to visit her significant other today.

## 2018-02-25 ENCOUNTER — Other Ambulatory Visit: Payer: Self-pay

## 2018-02-25 ENCOUNTER — Telehealth: Payer: Self-pay | Admitting: Psychiatry

## 2018-02-25 DIAGNOSIS — T421X1A Poisoning by iminostilbenes, accidental (unintentional), initial encounter: Secondary | ICD-10-CM | POA: Diagnosis not present

## 2018-02-25 DIAGNOSIS — G92 Toxic encephalopathy: Secondary | ICD-10-CM | POA: Diagnosis not present

## 2018-02-25 DIAGNOSIS — F319 Bipolar disorder, unspecified: Secondary | ICD-10-CM

## 2018-02-25 DIAGNOSIS — D649 Anemia, unspecified: Secondary | ICD-10-CM | POA: Diagnosis not present

## 2018-02-25 DIAGNOSIS — R002 Palpitations: Secondary | ICD-10-CM | POA: Diagnosis not present

## 2018-02-25 DIAGNOSIS — E039 Hypothyroidism, unspecified: Secondary | ICD-10-CM | POA: Diagnosis not present

## 2018-02-25 MED ORDER — CARBAMAZEPINE ER 200 MG PO CP12: ORAL_CAPSULE | ORAL | 0 refills | Status: DC

## 2018-02-25 MED ORDER — LEVOTHYROXINE SODIUM 88 MCG PO TABS: 88.0000 ug | ORAL_TABLET | Freq: Every day | ORAL | 0 refills | Status: DC

## 2018-02-25 MED ORDER — QUETIAPINE FUMARATE 100 MG PO TABS: 100.0000 mg | ORAL_TABLET | Freq: Every day | ORAL | 0 refills | Status: DC

## 2018-02-25 MED ORDER — QUETIAPINE FUMARATE 100 MG PO TABS: 100.0000 mg | ORAL_TABLET | Freq: Every day | ORAL | Status: DC

## 2018-02-25 NOTE — Progress Notes (Signed)
Sister [name] will also have private duty asst-she will arrange w/[employer].

## 2018-02-25 NOTE — Progress Notes (Signed)
Physical Therapy Treatment Patient Details Name: Rebecca Novak MRN: [id number] DOB: Jul 10, 1945 Today's Date: 02/25/2018    History of Present Illness 72 y.o. female with history of bipolar disorder and hypothyroidism was recently admitted in August 2019 2 months ago with confusion and falls at this time patient was found to have carbamazepine toxicity presents to the ER with complaints of having fallen and also was found to be confused.     PT Comments    Observed pt's gait around room and to and from bathroom.  Pt present with functional, alternating gait and no occasional hold to furniture/wall/doorfram to steady self but no true LOB.  Performed a BERG balance test.  Pt scored 44/56 indicating significant fall risk 80% and need for a cane outdoors.     Follow Up Recommendations  Home health PT(per chart review, MD and CM are arranging [facility])     Equipment Recommendations       Recommendations for Other Services       Precautions / Restrictions Precautions Precautions: Fall Precaution Comments: fall just prior to admission; per chart pt had several falls prior to recent admission in August, pt denies h/o falls Restrictions Weight Bearing Restrictions: No    Mobility  Bed Mobility               General bed mobility comments: OOB in recliner   Transfers Overall transfer level: Modified independent Equipment used: None Transfers: Sit to/from [employer] to Stand: Modified independent (Device/Increase time) Stand pivot transfers: Modified independent (Device/Increase time)       General transfer comment: pt able to self perform with use of hands to steady self with stand to sit.    Ambulation/Gait Ambulation/Gait assistance: Modified independent (Device/Increase time) Gait Distance (Feet): 20 Feet Assistive device: None Gait Pattern/deviations: Step-through pattern;Decreased stride length Gait velocity: WNL   General Gait Details: pt able  to amb around room, around furniture and in/out bathroom with no LOB and good safety cognition.     Stairs             Wheelchair Mobility    Modified Rankin (Stroke Patients Only)       Balance                                 Standardized Balance Assessment Standardized Balance Assessment : Berg Balance Test Berg Balance Test Sit to Stand: Able to stand without using hands and stabilize independently Standing Unsupported: Able to stand safely 2 minutes Sitting with Back Unsupported but Feet Supported on Floor or Stool: Able to sit safely and securely 2 minutes Stand to Sit: Sits safely with minimal use of hands Transfers: Able to transfer safely, minor use of hands Standing Unsupported with Eyes Closed: Able to stand 10 seconds safely Standing Ubsupported with Feet Together: Able to place feet together independently and stand for 1 minute with supervision From Standing, Reach Forward with Outstretched Arm: Can reach forward >12 cm safely (5") From Standing Position, Pick up Object from Floor: Able to pick up shoe safely and easily From Standing Position, Turn to Look Behind Over each Shoulder: Looks behind from both sides and weight shifts well Turn 360 Degrees: Able to turn 360 degrees safely in 4 seconds or less Standing Unsupported, Alternately Place Feet on Step/Stool: Able to complete >2 steps/needs minimal assist Standing Unsupported, One Foot in Front: Needs help to step but can hold 15 seconds  Standing on One Leg: Unable to try or needs assist to prevent fall Total Score: 44        Cognition Arousal/Alertness: Awake/alert Behavior During Therapy: WFL for tasks assessed/performed Overall Cognitive Status: Within Functional Limits for tasks assessed                                 General Comments: appears at prior level       Exercises      General Comments General comments (skin integrity, edema, etc.): BERG score 44/56  indicating >80% fall risk and pt should use a cane outdoors      Pertinent Vitals/Pain Pain Assessment: No/denies pain    Home Living                      Prior Function            PT Goals (current goals can now be found in the care plan section) Progress towards PT goals: Progressing toward goals    Frequency    Min 3X/week      PT Plan Current plan remains appropriate    Co-evaluation              AM-PAC PT "6 Clicks" Daily Activity  Outcome Measure  Difficulty turning over in bed (including adjusting bedclothes, sheets and blankets)?: A Little Difficulty moving from lying on back to sitting on the side of the bed? : A Little Difficulty sitting down on and standing up from a chair with arms (e.g., wheelchair, bedside commode, etc,.)?: A Little Help needed moving to and from a bed to chair (including a wheelchair)?: A Little Help needed walking in hospital room?: A Little Help needed climbing 3-5 steps with a railing? : A Little 6 Click Score: 18    End of Session Equipment Utilized During Treatment: Gait belt Activity Tolerance: Patient tolerated treatment well Patient left: in chair;with call bell/phone within reach Nurse Communication: Mobility status PT Visit Diagnosis: History of falling (Z91.81)     Time: 6967-8938 PT Time Calculation (min) (ACUTE ONLY): 18 min  Charges:  $Gait Training: 8-22 mins                     Rica Koyanagi  PTA Acute  Rehabilitation Services Pager      [phone] Office      [phone]

## 2018-02-25 NOTE — Consult Note (Addendum)
   [facility] CM Inpatient Consult   02/25/2018  Rebecca Novak 08/04/1945 [id number]    Patient is active with [facility] Management. She is followed by [facility]. Please see chart review tab then encounters for patient outreach details.   Spoke with inpatient RNCM to make aware [facility] is active. Ongoing discussions about discharge planning with inpatient team. Patient is eligible for [place] program as well.  Spoke with Rebecca Novak at bedside to discuss ongoing [facility] Management services. She remains agreeable. Will continue to follow along for progression and disposition plans.    Addendum 1441pm: Made aware by Rebecca Novak with [place] and inpatient RNCM that Rebecca Novak will enroll in the [place] program. Asked Rebecca Novak to have [place] staff re-refer back to [facility] Management post program completion. Will make [facility] RNCM aware of [facility] enrollment. Will notify [place] Management office as well. [facility] Care Management will assist with transportation if needed to [place].    Marthenia Rolling, MSN-Ed, RN,BSN [facility] Liaison [phone]

## 2018-02-25 NOTE — Clinical Social Work Note (Signed)
Clinical Social Work Assessment  Patient Details  Name: KIYONA MCNALL MRN: [id number] Date of Birth: 08/01/1945  Date of referral:  02/25/18               Reason for consult:  Facility Placement, Discharge Planning                Permission sought to share information with:    Permission granted to share information::     Name::        Agency::     Relationship::     Contact Information:     Housing/Transportation Living arrangements for the past 2 months:  [place] of Information:  Siblings(Sister - Kendria Halberg [phone])) Patient Interpreter Needed:  None Criminal Activity/Legal Involvement Pertinent to Current Situation/Hospitalization:  No - Comment as needed Significant Relationships:  Significant Other, Siblings Lives with:  Siblings Do you feel safe going back to the place where you live?  Yes Need for family participation in patient care:  Yes (Comment)  Care giving concerns:  Patient from home with spouse. Patient's sister reported that patient recently discharged from [facility] SNF back home. Patient's sister reported that the patient is the caregiver for her spouse. Patient's sister reported that patient has a walker at home and is unsure if patient uses it. Patient's sister reported that patient has home health services. PT recommended NO PT follow up. CSW consulted for SNF placement.    Social Worker assessment / plan:  CSW spoke with patient's sister regarding consult for SNF and discharge planning. Patient's sister reported that she prefers that patient discharge to [facility] SNF and that she is working on having a caregiver at patient's home. CSW explained that patient currently has no ST rehab needs and inquired if they are interested in Yomaris Palecek term care at [facility]. Patient's sister reported that they are not interested in Tavonna Worthington term care at [facility]. Patient's sister reported that she will hire a sitter to be with patient 3 days/week to manage  medications and assist with ADLs. Patient's sister reported that she will become more active in patient's care and oversee patient's medications. Patient's sister verbalized plan for patient to discharge home.  CSW signing off, no other needs identified at this time.  Employment status:  Retired Forensic scientist:  Medicare PT Recommendations:  No Follow Up Information / Referral to community resources:     Patient/Family's Response to care:  Patient's sister appreciative of CSW assistance with discharge planning.   Patient/Family's Understanding of and Emotional Response to Diagnosis, Current Treatment, and Prognosis:  Patient's sister involved in patient's care, patient oriented to self and unable to participate in assessment. Patient's sister verbalized understanding of patient's current treatment plan and verbalized plan for patient to return home with hired help. Patient's sister reported that patient is not ready for [facility] term care at SNF.  Emotional Assessment Appearance:    Attitude/Demeanor/Rapport:  Unable to Assess Affect (typically observed):  Unable to Assess Orientation:  Oriented to Self Alcohol / Substance use:  Not Applicable Psych involvement (Current and /or in the community):  Outpatient Provider  Discharge Needs  Concerns to be addressed:  Care Coordination Readmission within the last 30 days:  No Current discharge risk:  None Barriers to Discharge:  Continued Medical Work up   [employer], LCSW 02/25/2018, 11:10 AM

## 2018-02-25 NOTE — Patient Outreach (Signed)
[facility]) Care Management  02/25/2018  Rebecca Novak 03-22-1946 [id number]   Care Coordination: RNCM spoke with [facility] Liaison, Marthenia Rolling and inpatient case manager, Leanne Chang regarding plan of care.  Plan: RNCM will follow up with client post discharge pending discharge disposition.   Thea Silversmith, RN, MSN, [place] Coordinator Cell: [phone]

## 2018-02-25 NOTE — Care Management Note (Signed)
Case Management Note  Patient Details  Name: ROIZA WIEDEL MRN: [id number] Date of Birth: 1945-11-21  Subjective/Objective: Patient/sister informed of being accepted by [facility] 1st program rep [facility] aware of d/c & HHC orders-HHRN/PT/OT(aide will also be provided) I have informed [facility] rep [facility]-patient/sister does not want their services-informed to close out case. MD updated.No further CM needs.                  Action/Plan:dc home w/HHC.   Expected Discharge Date:  (unknown)               Expected Discharge Plan:  [place]  In-House Referral:     Discharge planning Services  CM Consult  Post Acute Care Choice:  Home Health, Durable Medical Equipment(Active w/[facility];has rw) Choice offered to:  Patient, Sibling  DME Arranged:    DME Agency:  [facility]  HH Arranged:  RN, PT, OT [facility] Agency:  [facility]  Status of Service:  Completed, signed off  If discussed at [place] of Stay Meetings, dates discussed:    Additional Comments:  [name], RN 02/25/2018, 12:21 PM

## 2018-02-25 NOTE — Care Management Obs Status (Signed)
[place] NOTIFICATION   Patient Details  Name: LYNDY RUSSMAN MRN: [id number] Date of Birth: 05-25-1946   Medicare Observation Status Notification Given:  Yes    MahabirJuliann Pulse, RN 02/25/2018, 11:53 AM

## 2018-02-25 NOTE — Care Management Note (Signed)
Case Management Note  Patient Details  Name: Rebecca Novak MRN: [id number] Date of Birth: 09/26/1945  Subjective/Objective:  Spoke to patient/her sister [name] on phone per patient permission-discussed d/c plans, & insurance coverage,& recc-Currently active w/[facility],active w/[facility], PT recc no f/u, per nurse tech-can ambulate from bed to chair w/1 asst, already has rw @ home, [facility] 1st rep Tommi Rumps will check if can accept. Patient, & attending wanted SNF but due to insurance-no coverage-it would be private pay-patient can't afford. Awaiting response from [facility] 1st.Recc HHRN/PT/aide                   Action/Plan:d/c home w/HHC.   Expected Discharge Date:  (unknown)               Expected Discharge Plan:  [place]  In-House Referral:     Discharge planning Services  CM Consult  Post Acute Care Choice:  Home Health, Durable Medical Equipment(Active w/[facility];has rw) Choice offered to:     DME Arranged:    DME Agency:     HH Arranged:    HH Agency:     Status of Service:  In process, will continue to follow  If discussed at Long Length of Stay Meetings, dates discussed:    Additional Comments:  [name], RN 02/25/2018, 11:44 AM

## 2018-02-25 NOTE — Telephone Encounter (Signed)
RTC from Dr. Bonner Puna.  Pt admitted 2 days ago with falling, confusion, failure to thrive.  CBZ 13.9.  Agreed on plan.: Decrease quetiapine to 100 mg immediate release instead of 200 mg XR. Reduce carbamazepine extended release from 800 mg a day to 600 mg a day. Follow levels closely.  Discussed free versus total carbamazepine blood levels. We both agree patient needs some sort of assisted living if not nursing home placement or perhaps rehab to improve compliance with this medication and nutrition.  Complicating factor is her common-law husband is also in the hospital with terminal cancer.

## 2018-02-25 NOTE — Discharge Summary (Signed)
Physician Discharge Summary  Rebecca Novak:[id number] DOB: 1945-10-11 DOA: 02/23/2018  PCP: Marletta Lor, MD  Admit date: 02/23/2018 Discharge date: 02/25/2018  Admitted From: Home Disposition: Home   Recommendations for Outpatient Follow-up:  1. Follow up with PCP in 1-2 weeks. 2. Follow up with psychiatry soon, preferably in the next 1-2 weeks. 3. Check thyroid studies in approximately 4-6 weeks. Synthroid dose increased modestly to 45mcg from 115mcg due to suppressed TSH.  4. Follow up free carbamazepine level (pending at discharge).  Home Health: PT, OT, RN Equipment/Devices: Nonw new Discharge Condition: Stable CODE STATUS: Full Diet recommendation: Regular  Brief/Interim Summary: Rebecca Novak is a 72 y.o. female with a history of bipolar disorder and recent admission for carbamazepine toxicity who presented by EMS for frequent falls and confusion. Carbamazepine level was found to be elevated and patient unsteady on her feet, so placed in observation and carbamazepine held. Her mental status has improved and gait imbalance has similarly resolved. Case discussed with the patient's psychiatrist who recommended medication changes as below, decreasing dose of carbamazepine and seroquel. Physical therapy has recommended home health at discharge, though she doesn't have functional limitations sufficient to justify SNF placement. Her sister was involved in the patient's care and will arrange home health aide/assistance more at home, in addition to home health arranged by care management.   Discharge Diagnoses:  Principal Problem:   Carbamazepine toxicity Active Problems:   Hypothyroidism   Palpitations   Bipolar disorder (HCC)   Normocytic normochromic anemia  Frequent falls, failure to thrive:  - PT consulted, will get home health services and sister will assist with management at home. She does not qualify for SNF placement. Gait imbalance has  resolved.  Bipolar disorder:  - Discussed with the patient's psychiatrist, Dr. Clovis Pu. I appreciate his recommendations going forward: Decrease carbamazepine as below, also decreasing dose of seroquel (which was planned as outpatient) from 200mg  XR qHS to 100mg  IR qHS to help with sleep and limit daytime sedation/gait imbalance.  Acute toxic encephalopathy: Improved.  Carbamazepine toxicity: More mild than previous episode. No reported dosing changes, so possibly need to decrease dose in setting of weight loss.  - Will restart carbamazepine at reduced dose, 200mg  qAM, 400mg  qPM.   Discharge Instructions Discharge Instructions    Diet general   Complete by:  As directed    Discharge instructions   Complete by:  As directed    After discussion with your psychiatrist, the following changes to your medications have been made:  - Decrease the dose of carbamazepine: STOP taking the doses you are taking and instead take 200mg  in the morning and 400mg  in the evening.  - CHANGE seroquel. Stop taking the extended release formulation. A new prescription was sent to your pharmacy for 100mg  (lower than your current 200mg ) tablets to be taken at bedtime.  STOP taking synthroid 133mcg and REPLACE with synthroid 17mcg.   You will need to follow up with your psychiatrist and Dr. Raliegh Ip in the next 2 weeks. Please schedule these appointments. You are stable for discharge and will need increased assistance at home which is being arranged through care management and your sister. If your symptoms return, seek medical attention right away.   Increase activity slowly   Complete by:  As directed      Allergies as of 02/25/2018   No Known Allergies     Medication List    STOP taking these medications   QUEtiapine 200 MG 24 hr tablet  Commonly known as:  SEROQUEL XR Replaced by:  QUEtiapine 100 MG tablet     TAKE these medications   carbamazepine 200 MG Cp12 12 hr capsule Commonly known as:  EQUETRO Take 1  capsule (200 mg total) by mouth daily AND 2 capsules (400 mg total) at bedtime. What changed:  See the new instructions.   levothyroxine 88 MCG tablet Commonly known as:  SYNTHROID, LEVOTHROID Take 1 tablet (88 mcg total) by mouth daily. What changed:    medication strength  how much to take   Lurasidone HCl 120 MG Tabs Take 1 tablet (120 mg total) by mouth every evening.   metoprolol tartrate 25 MG tablet Commonly known as:  LOPRESSOR Take 1 tablet (25 mg total) by mouth daily as needed (palpitations).   QUEtiapine 100 MG tablet Commonly known as:  SEROQUEL Take 1 tablet (100 mg total) by mouth at bedtime. Replaces:  QUEtiapine 200 MG 24 hr tablet   simvastatin 40 MG tablet Commonly known as:  ZOCOR Take 1 tablet (40 mg total) by mouth at bedtime.      Follow-up Information    Care, [facility] Follow up.   Specialty:  Home Health Services Why:  Home 1st program-HH nursing/physical therapy/occupational therapy Contact information: [place] [address] [phone]        Marletta Lor, MD. Schedule an appointment as soon as possible for a visit in 2 week(s).   Specialty:  Internal Medicine Contact information: [place] [address] [phone]        Purnell Shoemaker., MD. Schedule an appointment as soon as possible for a visit in 2 week(s).   Specialty:  Psychiatry Contact information: [place] [address] [phone]          No Known Allergies  Consultations:  Psychiatry, Dr. Clovis Pu  Procedures/Studies: Dg Chest 2 View  Result Date: 02/18/2018 CLINICAL DATA:  Weakness EXAM: CHEST - 2 VIEW COMPARISON:  01/17/2018 FINDINGS: The heart size and mediastinal contours are within normal limits. Atherosclerotic nonaneurysmal thoracic aorta. Both lungs are clear. Mild degenerative change along the dorsal spine. IMPRESSION: No active cardiopulmonary  disease. Electronically Signed   By: Ashley Royalty M.D.   On: 02/18/2018 18:09   Ct Head Wo Contrast  Result Date: 02/18/2018 CLINICAL DATA:  Decreased level of consciousness, gait abnormality. EXAM: CT HEAD WITHOUT CONTRAST TECHNIQUE: Contiguous axial images were obtained from the base of the skull through the vertex without intravenous contrast. COMPARISON:  None. FINDINGS: Brain: There is atrophy and chronic small vessel disease changes. No acute intracranial abnormality. Specifically, no hemorrhage, hydrocephalus, mass lesion, acute infarction, or significant intracranial injury. Vascular: No hyperdense vessel or unexpected calcification. Skull: No acute calvarial abnormality. Sinuses/Orbits: No acute findings Other: None IMPRESSION: No acute intracranial abnormality. Atrophy, chronic microvascular disease. Electronically Signed   By: Rolm Baptise M.D.   On: 02/18/2018 18:01    Subjective: Feels well, steady on her feet. Answers all orientation questions quickly and correctly, much more alert today. Got to visit with her husband who is also admitted down the hall from her.   Discharge Exam: Vitals:   02/24/18 2037 02/25/18 0636  BP: 118/64 119/67  Pulse: 70 (!) 56  Resp: 18   Temp: 98.3 F (36.8 C) 98.1 F (36.7 C)  SpO2: 98% 96%   General: Pt is alert, awake, not in acute distress Cardiovascular: RRR, S1/S2 +, no rubs, no gallops Respiratory:  CTA bilaterally, no wheezing, no rhonchi Abdominal: Soft, NT, ND, bowel sounds + Extremities: No edema, no cyanosis  Neuro: Alert, oriented, no focal deficits, steady gait.  Labs: BNP (last 3 results) No results for input(s): BNP in the last 8760 hours. Basic Metabolic Panel: Recent Labs  Lab 02/18/18 1648 02/23/18 2009 02/24/18 0547  NA 141 144 145  K 3.9 3.5 3.5  CL 105 111 111  CO2 26 24 26   GLUCOSE 110* 115* 101*  BUN 25* 19 18  CREATININE 1.03* 0.83 0.88  CALCIUM 8.9 8.2* 8.6*  MG  --   --  2.2   Liver Function  Tests: Recent Labs  Lab 02/18/18 1648 02/23/18 2009 02/24/18 0547  AST 19 19 21   ALT 13 10 10   ALKPHOS 78 65 62  BILITOT 0.4 0.4 0.8  PROT 7.1 6.1* 5.8*  ALBUMIN 4.1 3.4* 3.3*   No results for input(s): LIPASE, AMYLASE in the last 168 hours. No results for input(s): AMMONIA in the last 168 hours. CBC: Recent Labs  Lab 02/18/18 1648 02/23/18 1828 02/24/18 0547  WBC 7.4 8.5 6.0  NEUTROABS 6.1  --  3.5  HGB 13.0 10.9* 10.8*  HCT 40.0 33.7* 33.4*  MCV 90.9 90.6 90.3  PLT 241 223 232   Cardiac Enzymes: Recent Labs  Lab 02/18/18 1648 02/24/18 0547  CKTOTAL  --  117  TROPONINI <0.03  --    BNP: Invalid input(s): POCBNP CBG: Recent Labs  Lab 02/23/18 1929  GLUCAP 114*   D-Dimer No results for input(s): DDIMER in the last 72 hours. Hgb A1c No results for input(s): HGBA1C in the last 72 hours. Lipid Profile No results for input(s): CHOL, HDL, LDLCALC, TRIG, CHOLHDL, LDLDIRECT in the last 72 hours. Thyroid function studies Recent Labs    02/24/18 0547  TSH 0.072*   Anemia work up Recent Labs    02/24/18 0547  VITAMINB12 196  FOLATE 11.8  FERRITIN 34  TIBC 330  IRON 91  RETICCTPCT 1.0   Urinalysis    Component Value Date/Time   COLORURINE YELLOW 02/23/2018 2256   APPEARANCEUR CLEAR 02/23/2018 2256   LABSPEC 1.009 02/23/2018 2256   PHURINE 6.0 02/23/2018 2256   GLUCOSEU NEGATIVE 02/23/2018 2256   HGBUR NEGATIVE 02/23/2018 2256   HGBUR negative 11/01/2008 0856   BILIRUBINUR NEGATIVE 02/23/2018 2256   BILIRUBINUR n 02/02/2015 1425   KETONESUR 5 (A) 02/23/2018 2256   PROTEINUR NEGATIVE 02/23/2018 2256   UROBILINOGEN 0.2 02/02/2015 1425   UROBILINOGEN 1.0 02/14/2013 1824   NITRITE NEGATIVE 02/23/2018 2256   LEUKOCYTESUR NEGATIVE 02/23/2018 2256    Microbiology No results found for this or any previous visit (from the past 240 hour(s)).  Time coordinating discharge: Approximately 40 minutes  Patrecia Pour, MD  [facility] 02/25/2018,  1:19 PM Pager [phone]

## 2018-02-26 ENCOUNTER — Other Ambulatory Visit: Payer: Self-pay

## 2018-02-26 ENCOUNTER — Telehealth: Payer: Self-pay | Admitting: Psychiatry

## 2018-02-26 DIAGNOSIS — G92 Toxic encephalopathy: Secondary | ICD-10-CM | POA: Diagnosis not present

## 2018-02-26 DIAGNOSIS — T421X1D Poisoning by iminostilbenes, accidental (unintentional), subsequent encounter: Secondary | ICD-10-CM | POA: Diagnosis not present

## 2018-02-26 LAB — CARBAMAZEPINE, FREE AND TOTAL
Carbamazepine, Free: 1.2 ug/mL (ref 0.6–4.2)
Carbamazepine, Total: 3.9 ug/mL — ABNORMAL LOW (ref 4.0–12.0)

## 2018-02-26 NOTE — Telephone Encounter (Signed)
RTC to sister Brittainy Bucker as pt has been unable to manage her own medications and siter is helping' .  Reviewed and reinforced the medication changes that were made by the hospital which include a reduction in carbamazepine XR to 200 mg in the morning 400 mg at night and over the change in quetiapine from the XR 200 mg to the immediate release 100 mg nightly.  Patient is being seen in our office on Monday.  Left message if they need to call back please feel free to do so.  This message was left on Sally Vuncannon's phone as she was not available.

## 2018-02-26 NOTE — Patient Outreach (Signed)
[facility]) Care Management  02/26/2018  Rebecca Novak Feb 01, 1946 [id number]   Care Coordination: RNCM spoke with [facility] Liaison. Client discharged home with the [place] program. [facility] home first to refer to [facility] Management upon completion of the program or as needed.  Plan: RNCM will close case as client's care is being managed through the [place], RN, MSN, [place] Coordinator Cell: [phone]

## 2018-02-26 NOTE — Telephone Encounter (Signed)
Sally(sister) called to request that all of Rebecca Novak's medications get called in for refills.

## 2018-02-26 NOTE — Telephone Encounter (Signed)
Would you like me to send them? You did have her chart.   Just let me know.  Thank you

## 2018-02-28 DIAGNOSIS — G92 Toxic encephalopathy: Secondary | ICD-10-CM | POA: Diagnosis not present

## 2018-02-28 DIAGNOSIS — T421X1D Poisoning by iminostilbenes, accidental (unintentional), subsequent encounter: Secondary | ICD-10-CM | POA: Diagnosis not present

## 2018-03-01 ENCOUNTER — Ambulatory Visit: Payer: Self-pay | Admitting: Pharmacist

## 2018-03-01 ENCOUNTER — Ambulatory Visit: Payer: Medicare Other | Admitting: Family Medicine

## 2018-03-01 ENCOUNTER — Ambulatory Visit: Payer: Self-pay | Admitting: Psychiatry

## 2018-03-01 DIAGNOSIS — T421X1D Poisoning by iminostilbenes, accidental (unintentional), subsequent encounter: Secondary | ICD-10-CM | POA: Diagnosis not present

## 2018-03-01 DIAGNOSIS — G92 Toxic encephalopathy: Secondary | ICD-10-CM | POA: Diagnosis not present

## 2018-03-02 ENCOUNTER — Telehealth: Payer: Self-pay

## 2018-03-02 DIAGNOSIS — G92 Toxic encephalopathy: Secondary | ICD-10-CM | POA: Diagnosis not present

## 2018-03-02 DIAGNOSIS — T421X1D Poisoning by iminostilbenes, accidental (unintentional), subsequent encounter: Secondary | ICD-10-CM | POA: Diagnosis not present

## 2018-03-02 NOTE — Telephone Encounter (Signed)
Verbal orders given to Don  

## 2018-03-02 NOTE — Telephone Encounter (Signed)
Copied from CRM #[id number]. Topic: Inquiry >> Mar 02, 2018  4:42 PM [place], Harvey B wrote: Reason for CRM: [facility] called for verbals for OT for adl transfers, iadl exercise and dc when goals met or maximum potential, contact [phone] 

## 2018-03-04 ENCOUNTER — Telehealth: Payer: Self-pay

## 2018-03-04 ENCOUNTER — Encounter: Payer: Self-pay | Admitting: Family Medicine

## 2018-03-04 ENCOUNTER — Ambulatory Visit: Payer: Self-pay | Admitting: Pharmacist

## 2018-03-04 ENCOUNTER — Ambulatory Visit (INDEPENDENT_AMBULATORY_CARE_PROVIDER_SITE_OTHER): Payer: Medicare Other | Admitting: Family Medicine

## 2018-03-04 ENCOUNTER — Other Ambulatory Visit: Payer: Self-pay | Admitting: Pharmacist

## 2018-03-04 VITALS — BP 118/62 | HR 67 | Temp 98.2°F | Wt 167.2 lb

## 2018-03-04 DIAGNOSIS — Z23 Encounter for immunization: Secondary | ICD-10-CM

## 2018-03-04 DIAGNOSIS — T421X4S Poisoning by iminostilbenes, undetermined, sequela: Secondary | ICD-10-CM

## 2018-03-04 DIAGNOSIS — F3162 Bipolar disorder, current episode mixed, moderate: Secondary | ICD-10-CM | POA: Diagnosis not present

## 2018-03-04 DIAGNOSIS — E039 Hypothyroidism, unspecified: Secondary | ICD-10-CM | POA: Diagnosis not present

## 2018-03-04 DIAGNOSIS — R29898 Other symptoms and signs involving the musculoskeletal system: Secondary | ICD-10-CM | POA: Diagnosis not present

## 2018-03-04 MED ORDER — CARBAMAZEPINE ER 200 MG PO CP12: ORAL_CAPSULE | ORAL | 1 refills | Status: DC

## 2018-03-04 NOTE — Telephone Encounter (Signed)
[facility], and spoke with supervisor. He stated that Rebecca Novak nurse has expressed concerns with whether or not she is in fact taking all of her medications. He stated that he would have the nurse call back to discuss further.  Expecting a call back from Rebecca Novak with Rebecca Novak home health in regards to medication issus.

## 2018-03-04 NOTE — Progress Notes (Signed)
Rebecca Novak DOB: May 02, 1946 Encounter date: 03/04/2018  This is a 72 y.o. female who presents to establish care and for hospital followup. Chief Complaint  Patient presents with  . Hospitalization Follow-up    History of present illness: Admitted: 02/23/18 Discharged 02/25/18 Recommendations for psychiatry followup in next 1-2 weeks; thyroid studies in 4-6 weeks; check d/c carbamazepine level (3.9 on 10/3)  Admitted for carbamazepine toxicity (presented with frequent falls and confusion). Mental status and gait imbalance improved with holding of medication. Patient's psychiatrist, Dr. Clovis Pu, contacted and made medication adjustments. Home health recommended for PT.    Someone has been out to the home for assessment. She isn't sure who. Thinks it was more nurse rather than PT.   Energy ok; not weak. Sleeping ok since coming home. Didn't pick up any of medications from hospital so not sure what she has been taking at home.   Mood has been alright. States that she is feeling fine and has no concerns. She doesn't remember going to hospital or circumstances around needing to go.     Past Medical History:  Diagnosis Date  . ANEMIA, MILD 05/17/2008  . COLONIC POLYPS, HX OF 05/17/2007  . Depression    bipolar  . GLUCOSE INTOLERANCE 11/23/2007  . HAND PAIN 07/31/2008  . HYPERLIPIDEMIA 02/05/2007  . HYPOTHYROIDISM 02/05/2007  . Skin cancer    "scraped off chest and RLE" (02/15/2013)  . SYSTOLIC MURMUR 94/85/4627   Past Surgical History:  Procedure Laterality Date  . APPENDECTOMY    . TONSILLECTOMY    . VAGINAL HYSTERECTOMY     No Known Allergies Current Meds  Medication Sig  . carbamazepine (EQUETRO) 200 MG CP12 12 hr capsule Take 1 capsule (200 mg total) by mouth daily AND 2 capsules (400 mg total) at bedtime.  Marland Kitchen levothyroxine (SYNTHROID) 88 MCG tablet Take 1 tablet (88 mcg total) by mouth daily.  . Lurasidone HCl (LATUDA) 120 MG TABS Take 1 tablet (120 mg total) by mouth  every evening.  . metoprolol tartrate (LOPRESSOR) 25 MG tablet Take 1 tablet (25 mg total) by mouth daily as needed (palpitations).  . QUEtiapine (SEROQUEL) 100 MG tablet Take 1 tablet (100 mg total) by mouth at bedtime.  . simvastatin (ZOCOR) 40 MG tablet Take 1 tablet (40 mg total) by mouth at bedtime.  . [DISCONTINUED] carbamazepine (EQUETRO) 200 MG CP12 12 hr capsule Take 1 capsule (200 mg total) by mouth daily AND 2 capsules (400 mg total) at bedtime.   Social History   Tobacco Use  . Smoking status: Former Smoker    Packs/day: 0.33    Years: 44.00    Pack years: 14.52    Types: Cigarettes  . Smokeless tobacco: Never Used  . Tobacco comment: 02/15/2013 "stopped smoking cigarettes ~ 7 yr ago"  Substance Use Topics  . Alcohol use: No   Family History  Problem Relation Age of Onset  . Dementia Mother   . Healthy Father        until death at age 66  . Mental illness Brother   . Healthy Sister      Review of Systems  Constitutional: Negative for chills, fatigue and fever.  Respiratory: Negative for cough, chest tightness, shortness of breath and wheezing.   Cardiovascular: Negative for chest pain, palpitations and leg swelling.  Musculoskeletal: Negative for arthralgias.  Skin: Negative for rash and wound.  Psychiatric/Behavioral: Negative for confusion, hallucinations, sleep disturbance and suicidal ideas. The patient is not nervous/anxious.     Objective:  BP 118/62 (BP Location: Left Arm, Patient Position: Sitting, Cuff Size: Normal)   Pulse 67   Temp 98.2 F (36.8 C) (Oral)   Wt 167 lb 3.2 oz (75.8 kg)   SpO2 95%   BMI 25.42 kg/m   Weight: 167 lb 3.2 oz (75.8 kg)   BP Readings from Last 3 Encounters:  03/04/18 118/62  02/25/18 119/67  02/18/18 137/72   Wt Readings from Last 3 Encounters:  03/04/18 167 lb 3.2 oz (75.8 kg)  02/23/18 166 lb (75.3 kg)  02/18/18 164 lb (74.4 kg)    Physical Exam  Constitutional: She is oriented to person, place, and time.  She appears well-developed and well-nourished. No distress.  Eyes: Pupils are equal, round, and reactive to light. Conjunctivae are normal.  Neck: Neck supple.  Cardiovascular: Normal rate, regular rhythm and normal heart sounds. Exam reveals no friction rub.  No murmur heard. No lower extremity edema  Pulmonary/Chest: Effort normal and breath sounds normal. No respiratory distress. She has no wheezes. She has no rales.  Neurological: She is alert and oriented to person, place, and time. She has normal strength. She displays no tremor. No cranial nerve deficit.  Reflex Scores:      Tricep reflexes are 2+ on the right side and 2+ on the left side.      Bicep reflexes are 2+ on the right side and 2+ on the left side.      Brachioradialis reflexes are 2+ on the right side and 2+ on the left side.      Patellar reflexes are 2+ on the right side and 2+ on the left side. Strength is intact bilat upper and lower extremities. Gait is somewhat slow.  Skin: Skin is warm and dry.  There is exstensor bruising forearms, resolving (yellow in color). There is bruising from IV in hospital left arm. She also has bruising right upper lateral arm. She is not sure where bruises have come from.   Psychiatric: Her speech is delayed. She is slowed. She is not actively hallucinating. Cognition and memory are normal.  Dinia is here by herself and this is my first time meeting with her; so difficult to compare to her "baseline". She answers questions with short responses and has difficulty elaborating. She is not sure about medications that she is taking (even ones she has been on for long term like carbamazepine). She is pleasant on interraction. She is somewhat antsy and not tolerating waiting for provider although she was late for appointment and waiting for short time.  She is attentive.    Assessment/Plan: 1. Carbamazepine toxicity, undetermined intent, sequela Recent level was low; per patient she was not  taking this on discharge. I have resent to pharmacy (we called [facility] and they didn't get on discharge) and highlighted home med list for her to check. We will also call home health nurse to run list and meds to make sure that she is taking prescribed meds.  2. Weakness of both legs Will check with home nurse about ?PT ordered for home.  3. Acquired hypothyroidism Recheck 1 mo - TSH; Future  4. Bipolar disorder, current episode mixed, moderate ([place]) Call psychiatrist to schedule f/u. - carbamazepine (EQUETRO) 200 MG CP12 12 hr capsule; Take 1 capsule (200 mg total) by mouth daily AND 2 capsules (400 mg total) at bedtime.  Dispense: 90 capsule; Refill: 1  5. Encounter for immunization - Flu vaccine HIGH DOSE PF  Return blood draw in 1 mo; follow up to  be scheduled at that time.  Micheline Rough, MD

## 2018-03-04 NOTE — Telephone Encounter (Signed)
Nurse Langley Gauss from [facility] back about medicine please call back at [phone]

## 2018-03-04 NOTE — Patient Instructions (Addendum)
Please call Dr. Clovis Pu to reschedule missed appointment.   Double check medications at home to make sure they are the same as med list printed in office.

## 2018-03-04 NOTE — Telephone Encounter (Signed)
-----  Message from Junell C Koberlein, MD sent at 03/04/2018  1:16 PM EDT ----- Please call this home health and just verify that they are coming out to see her and please ask them to review her medications with her. I am concerned because she did not get all the discharge medications (like carbamazepine) so I want to make sure that she is taking what is listed in our computer and what her psychiatrist has recommended.  Reason for CRM: [facility] called for verbals for OT for adl transfers, iadl exercise and dc when goals met or maximum potential, contact [phone] 

## 2018-03-04 NOTE — Telephone Encounter (Signed)
-----  Message from Caren Macadam, MD sent at 03/04/2018  1:16 PM EDT ----- Please call this home health and just verify that they are coming out to see her and please ask them to review her medications with her. I am concerned because she did not get all the discharge medications (like carbamazepine) so I want to make sure that she is taking what is listed in our computer and what her psychiatrist has recommended.  Reason for CRM: [facility] called for verbals for OT for adl transfers, iadl exercise and dc when goals met or maximum potential, contact [phone]

## 2018-03-04 NOTE — Patient Outreach (Signed)
[facility]) Care Management  [place]   03/04/2018  DANYAH GUASTELLA 1946/03/13 [id number]   Reason for referral: medication reconciliation  Referral source: [facility] RN CM Current insurance: BCBS Currently receiving Extra Help:  []  Yes []  No [x]  Unknown   PMHx: hypothyroidism, HLD, anemia, bipolar disorder, recurrent falls (recent admissions)   HPI: 54 yoF with recent admissions to ED for falls, confusion and failure to thrive.  Successful outreach call to Ms. Oliveto today with HIPAA identifiers verified. Carbamazepine level was supratherapeutic during ED visit at 13.9 on 02/23/18.  It has been supratherapeutic in the recent past as well.  Per patient and notes, Dr. Clovis Pu decreased quetiapine to 100 mg immediate release instead of 200 mg XR.  Patient is aware of changes and verbalizes understanding.  He also reduced carbamazepine extended release from 800 mg a day to 600 mg a day.  Reminded patient to follow up future levels now that dose has been reduced.  Patient denies needing any further pharmacy assistance.   Medication list has been updated in [facility].   Objective: Lab Results  Component Value Date   CREATININE 0.88 02/24/2018   CREATININE 0.83 02/23/2018   CREATININE 1.03 (H) 02/18/2018    Lab Results  Component Value Date   HGBA1C 5.7 02/02/2015    Lipid Panel     Component Value Date/Time   CHOL 228 (H) 02/02/2015 1027   TRIG 92.0 02/02/2015 1027   TRIG 45 05/05/2006 0904   HDL 74.70 02/02/2015 1027   CHOLHDL 3 02/02/2015 1027   VLDL 18.4 02/02/2015 1027   LDLCALC 135 (H) 02/02/2015 1027   LDLDIRECT 122.7 12/18/2010 0931    BP Readings from Last 3 Encounters:  02/25/18 119/67  02/18/18 137/72  02/18/18 104/68    No Known Allergies  Medications Reviewed Today    Reviewed by Lavera Guise, [facility] (Pharmacist) on 03/04/18 at 0941  Med List Status: <None>  Medication Order Taking? Sig Documenting Provider Last Dose Status  Informant  carbamazepine (EQUETRO) 200 MG CP12 12 hr capsule [id number] Yes Take 1 capsule (200 mg total) by mouth daily AND 2 capsules (400 mg total) at bedtime. Patrecia Pour, MD Taking Active   levothyroxine (SYNTHROID) 88 MCG tablet [id number] Yes Take 1 tablet (88 mcg total) by mouth daily. Patrecia Pour, MD Taking Active   Lurasidone HCl (LATUDA) 120 MG TABS [id number] Yes Take 1 tablet (120 mg total) by mouth every evening. Marletta Lor, MD Taking Active Self  metoprolol tartrate (LOPRESSOR) 25 MG tablet [id number] Yes Take 1 tablet (25 mg total) by mouth daily as needed (palpitations). Marletta Lor, MD Taking Active Self  QUEtiapine (SEROQUEL) 100 MG tablet [id number] Yes Take 1 tablet (100 mg total) by mouth at bedtime. Patrecia Pour, MD Taking Active   simvastatin (ZOCOR) 40 MG tablet [id number] Yes Take 1 tablet (40 mg total) by mouth at bedtime. Marletta Lor, MD Taking Active Self         Assessment:  Drugs sorted by system:  Neurologic/Psychologic: carbamazepine, lurasidone, quetiapine  Cardiovascular: simvastatin, metoprolol  Endocrine: levothyroxine  Medication Review Findings:  . Drug-drug interaction-plasma concentrations & pharmacologic effects of Lurasidone may be decreased by carbamzepine.  Coadministration is contraindicated. Per Micromedex . Patient could benefit from taking a MVI . Given patient's recent falls, calcium and vitamin D may be warranted for bone health    Plan: -[place] case is being closed due to the following reasons: The patient has  chosen to discontinue services. -Closure letters routed -Note routed to PCP   Thank you for allowing [facility] pharmacy to be involved in this patient's care.    Regina Eck, PharmD, [place]  [phone]

## 2018-03-05 DIAGNOSIS — G92 Toxic encephalopathy: Secondary | ICD-10-CM | POA: Diagnosis not present

## 2018-03-05 DIAGNOSIS — T421X1D Poisoning by iminostilbenes, accidental (unintentional), subsequent encounter: Secondary | ICD-10-CM | POA: Diagnosis not present

## 2018-03-05 NOTE — Telephone Encounter (Signed)
Left a message asking for a call back. When she does call back please ask which medications Rebecca Novak has been taking?  Please let her know that Rebecca Novak has not been taking her Carbamazepine that was prescribed at discharge, her pharmacy never received this prescription. Her pahrmacy states that she she picked up the Seroquel however Rebecca Novak stated that she does not remember picking it up. We are unsure if she has been taking this medication.  CRM created.

## 2018-03-08 DIAGNOSIS — T421X1D Poisoning by iminostilbenes, accidental (unintentional), subsequent encounter: Secondary | ICD-10-CM | POA: Diagnosis not present

## 2018-03-08 DIAGNOSIS — G92 Toxic encephalopathy: Secondary | ICD-10-CM | POA: Diagnosis not present

## 2018-03-09 DIAGNOSIS — T421X1D Poisoning by iminostilbenes, accidental (unintentional), subsequent encounter: Secondary | ICD-10-CM | POA: Diagnosis not present

## 2018-03-09 DIAGNOSIS — G92 Toxic encephalopathy: Secondary | ICD-10-CM | POA: Diagnosis not present

## 2018-03-10 DIAGNOSIS — G92 Toxic encephalopathy: Secondary | ICD-10-CM | POA: Diagnosis not present

## 2018-03-10 DIAGNOSIS — T421X1D Poisoning by iminostilbenes, accidental (unintentional), subsequent encounter: Secondary | ICD-10-CM | POA: Diagnosis not present

## 2018-03-10 NOTE — Telephone Encounter (Signed)
Per Dr. Ethlyn Gallery..side effects other phone note  Please check in again with home nursing and then with patient. I am concerned because now I see documented on paperwork from home review that although patient was unaware, it seemed that significant other was going to be moving out of house and that Alfretta's sister lives 45 minutes away and not sure about how involved she could be after hospitalization. This leaves a huge concern with Ceniya taking care of self, taking medications properly, etc.  Please get an update of Corretta's status. They seemed to have a lot of concerns with her living in home and taking care of herself after home visit? Has there been improvement? They also had concerns about her taking daily medications so we may need to discuss daily nurse visits.   Just want to make sure she is safe, getting care she needs, and getting follow up she needs.   I am happy to talk with home nurse if available.

## 2018-03-10 NOTE — Telephone Encounter (Signed)
Please check in again with home nursing and then with patient. I am concerned because now I see documented on paperwork from home review that although patient was unaware, it seemed that significant other was going to be moving out of house and that Rebecca Novak's sister lives 45 minutes away and not sure about how involved she could be after hospitalization. This leaves a huge concern with Rebecca Novak taking care of self, taking medications properly, etc.  Please get an update of Rebecca Novak's status. They seemed to have a lot of concerns with her living in home and taking care of herself after home visit? Has there been improvement? They also had concerns about her taking daily medications so we may need to discuss daily nurse visits.   Just want to make sure she is safe, getting care she needs, and getting follow up she needs.   I am happy to talk with home nurse if available.

## 2018-03-11 ENCOUNTER — Other Ambulatory Visit: Payer: Self-pay | Admitting: Pharmacist

## 2018-03-11 ENCOUNTER — Other Ambulatory Visit: Payer: Self-pay

## 2018-03-11 MED ORDER — LEVOTHYROXINE SODIUM 88 MCG PO TABS: 88.0000 ug | ORAL_TABLET | Freq: Every day | ORAL | 1 refills | Status: DC

## 2018-03-11 NOTE — Patient Outreach (Signed)
[facility]) Care Management  03/11/2018  Rebecca Novak 1945/11/20 [id number]    Incoming call from [name] with HIPAA identifiers verified.  Patient was confused by case closure letter previously sent due to patient stating she did not need any additional pharmacy services.   [facility] Pharmacist concerned that patient is not taking carbamazepine as recommended by PCP/psychiatrist. Patient states she has not been taking this since her hospitalization, however recent notes indicate that she should be on a lower dose now.  Last level was 3.9 on 02/25/18 per PCP note. Also per PCP note on 03/04/2018: Bipolar disorder, current episode mixed, moderate ([place]) Call psychiatrist to schedule f/u. - carbamazepine (EQUETRO) 200 MG CP12 12 hr capsule; Take 1 capsule (200 mg total) by mouth daily AND 2 capsules (400 mg total) at bedtime.  Dispense: 90 capsule; Refill: 1  Patient was provided with [facility] telephone number since she is being seen by [facility] vs [facility].  PLAN:  -Will f/u with pharmacy to see if patient has picked up carbamazepine  -Will f/u with patient to see if there are any pharmacy needs within 2 weeks or sooner if problem identified

## 2018-03-11 NOTE — Telephone Encounter (Signed)
Left message for Rebecca Novak to call back. CRM created.

## 2018-03-15 NOTE — Telephone Encounter (Signed)
Received a fax for orders for 1W3 medication management and assessment and observation.  Orders have been approved by Dr. Ethlyn Gallery and faxed back with a written note for nurse to contact the office regurding previous messages.  Fax #: [phone]

## 2018-03-16 ENCOUNTER — Ambulatory Visit: Payer: Medicare Other

## 2018-03-16 ENCOUNTER — Encounter: Payer: Self-pay | Admitting: Emergency Medicine

## 2018-03-16 ENCOUNTER — Other Ambulatory Visit: Payer: Self-pay

## 2018-03-16 DIAGNOSIS — G92 Toxic encephalopathy: Secondary | ICD-10-CM | POA: Diagnosis not present

## 2018-03-16 DIAGNOSIS — T421X1D Poisoning by iminostilbenes, accidental (unintentional), subsequent encounter: Secondary | ICD-10-CM | POA: Diagnosis not present

## 2018-03-16 DIAGNOSIS — F411 Generalized anxiety disorder: Secondary | ICD-10-CM | POA: Insufficient documentation

## 2018-03-16 NOTE — Telephone Encounter (Signed)
Opened while accessing medication

## 2018-03-17 ENCOUNTER — Other Ambulatory Visit: Payer: Self-pay | Admitting: Pharmacist

## 2018-03-17 ENCOUNTER — Other Ambulatory Visit: Payer: Self-pay

## 2018-03-17 ENCOUNTER — Ambulatory Visit: Payer: Self-pay | Admitting: Pharmacist

## 2018-03-17 DIAGNOSIS — F3162 Bipolar disorder, current episode mixed, moderate: Secondary | ICD-10-CM

## 2018-03-17 MED ORDER — QUETIAPINE FUMARATE 100 MG PO TABS: 100.0000 mg | ORAL_TABLET | Freq: Every day | ORAL | 0 refills | Status: DC

## 2018-03-17 MED ORDER — CARBAMAZEPINE ER 200 MG PO CP12: ORAL_CAPSULE | ORAL | 1 refills | Status: DC

## 2018-03-17 MED ORDER — LURASIDONE HCL 120 MG PO TABS: 120.0000 mg | ORAL_TABLET | Freq: Every evening | ORAL | 1 refills | Status: DC

## 2018-03-18 ENCOUNTER — Other Ambulatory Visit: Payer: Medicare Other

## 2018-03-19 DIAGNOSIS — G92 Toxic encephalopathy: Secondary | ICD-10-CM | POA: Diagnosis not present

## 2018-03-19 DIAGNOSIS — T421X1D Poisoning by iminostilbenes, accidental (unintentional), subsequent encounter: Secondary | ICD-10-CM | POA: Diagnosis not present

## 2018-03-24 NOTE — Telephone Encounter (Signed)
Attempted multiple times to contact Patients nurse. Unable to reach.

## 2018-03-24 NOTE — Patient Outreach (Addendum)
[facility]) Care Management [place]  03/24/2018  Rebecca Novak 05/06/1946 [id number]  Reason for referral: medication management  [facility] pharmacy case is being closed due to the following reasons:  The patient is participating in another care management program.  Encouraged patient to follow up with PCP and pharmacy regarding carbamazepine.  Reminded patient that a lower dose of carbamazepine was started by Dr. Ethlyn Gallery (new PCP) at last visit on 03/04/18.  Patient stated she would call MD office and pharmacy to follow up on medication.   Patient has an upcoming behavioral health MD appointment on 03/26/18.  They will continue to follow patient and behavioral health medications/ Patient denied needing assistance with medication management and declined pharmacy home visit at this time, therefore I will sign off at this time.  Patient has been provided [facility] CM contact information if assistance needed in the future.    Thank you for allowing [facility] pharmacy to be involved in this patient's care.    Regina Eck, PharmD, [place]  [phone]

## 2018-03-25 ENCOUNTER — Ambulatory Visit: Payer: Self-pay | Admitting: Psychiatry

## 2018-03-26 ENCOUNTER — Ambulatory Visit (INDEPENDENT_AMBULATORY_CARE_PROVIDER_SITE_OTHER): Payer: Medicare Other | Admitting: Psychiatry

## 2018-03-26 ENCOUNTER — Encounter: Payer: Self-pay | Admitting: Psychiatry

## 2018-03-26 DIAGNOSIS — F16921 Hallucinogen use, unspecified with intoxication with delirium: Secondary | ICD-10-CM

## 2018-03-26 DIAGNOSIS — F411 Generalized anxiety disorder: Secondary | ICD-10-CM | POA: Diagnosis not present

## 2018-03-26 DIAGNOSIS — F319 Bipolar disorder, unspecified: Secondary | ICD-10-CM

## 2018-03-26 DIAGNOSIS — G3184 Mild cognitive impairment, so stated: Secondary | ICD-10-CM | POA: Diagnosis not present

## 2018-03-26 NOTE — Progress Notes (Signed)
[name] [id number] 02/29/1946 72 y.o.  Subjective:   Patient ID:  Rebecca Novak is a 72 y.o. (DOB 06-Aug-1945) female.  Chief Complaint:  Chief Complaint  Patient presents with  . Altered Mental Status  . Depression  . Anxiety  . Memory Loss  . Medication Problem  . Stress  . Sleeping Problem    HPI Rebecca Novak presents to the office today for follow-up of above and bipolar disorder.  Multiple problems. 2 recent hospitalizations DT confusion and with high CBZ levels, the last was 13.9, but she was also dehydrated.  She's losing unknown amount of weight and c/o trouble going to sleep.  Claims she gets 8 hours.  Wants 9 hours.  Appetite seems OK, pushing herself to eat.   Patient reports stable mood and denies depressed or irritable moods.  Patient denies any recent difficulty with anxiety.  Patient denies difficulty with sleep maintenance, but compains of difficulty falling asleep. Denies appetite disturbance.  Patient reports that energy and motivation have been good.  Patient denies any difficulty with concentration.  Patient denies any suicidal ideation.   Denies confusion but aware of being forgetful.  Denies forgetting meds bc it's routine.  Disc Rebecca Novak no longer able to cook and he did this for them before and [name], sister, says pt eating poorly as a result.  Review of Systems:  Review of Systems  Constitutional: Positive for activity change, appetite change, fatigue and unexpected weight change.  Gastrointestinal: Negative.   Neurological: Positive for weakness. Negative for tremors.  Psychiatric/Behavioral: Positive for confusion, decreased concentration, dysphoric mood and sleep disturbance. Negative for agitation, behavioral problems, hallucinations, self-injury and suicidal ideas. The patient is not nervous/anxious and is not hyperactive.   Not dizzy.  Medications: I have reviewed the patient's current medications.  Current Outpatient Medications   Medication Sig Dispense Refill  . carbamazepine (EQUETRO) 200 MG CP12 12 hr capsule Take 1 capsule (200 mg total) by mouth daily AND 2 capsules (400 mg total) at bedtime. 90 capsule 1  . levothyroxine (SYNTHROID) 88 MCG tablet Take 1 tablet (88 mcg total) by mouth daily. 30 tablet 1  . Lurasidone HCl (LATUDA) 120 MG TABS Take 1 tablet (120 mg total) by mouth every evening. 90 tablet 1  . metoprolol tartrate (LOPRESSOR) 25 MG tablet Take 1 tablet (25 mg total) by mouth daily as needed (palpitations). 90 tablet 3  . QUEtiapine (SEROQUEL) 100 MG tablet Take 1 tablet (100 mg total) by mouth at bedtime. 90 tablet 0  . simvastatin (ZOCOR) 40 MG tablet Take 1 tablet (40 mg total) by mouth at bedtime. 90 tablet 0   No current facility-administered medications for this visit.     Medication Side Effects: None  Allergies: No Known Allergies  Past Medical History:  Diagnosis Date  . ANEMIA, MILD 05/17/2008  . COLONIC POLYPS, HX OF 05/17/2007  . Depression    bipolar  . GLUCOSE INTOLERANCE 11/23/2007  . HAND PAIN 07/31/2008  . HYPERLIPIDEMIA 02/05/2007  . HYPOTHYROIDISM 02/05/2007  . Skin cancer    "scraped off chest and RLE" (02/15/2013)  . SYSTOLIC MURMUR 84/69/6295    Family History  Problem Relation Age of Onset  . Dementia Mother   . Healthy Father        until death at age 34  . Mental illness Brother   . Healthy Sister     Social History   Socioeconomic History  . Marital status: Married    Spouse name: [name]  .  Number of children: 0  . Years of education: Not on file  . Highest education level: Not on file  Occupational History  . 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 Financial resource strain: Not hard at all  . Food insecurity:    Worry: Never true    Inability: Never true  . Transportation needs:    Medical: No    Non-medical: No  Tobacco Use  . Smoking status: Former Smoker    Packs/day: 0.33    Years: 44.00     Pack years: 14.52    Types: Cigarettes  . Smokeless tobacco: Never Used  . Tobacco comment: 02/15/2013 "stopped smoking cigarettes ~ 7 yr ago"  Substance and Sexual Activity  . Alcohol use: No  . Drug use: No  . Sexual activity: Yes  Lifestyle  . Physical activity:    Days per week: Not on file    Minutes per session: Not on file  . Stress: Not on file  Relationships  . Social connections:    Talks on phone: Patient refused    Gets together: Patient refused    Attends religious service: Patient refused    Active member of club or organization: Patient refused    Attends meetings of clubs or organizations: Patient refused    Relationship status: Patient refused  . Intimate partner violence:    Fear of current or ex partner: No    Emotionally abused: No    Physically abused: No    Forced sexual activity: No  Other Topics Concern  . Not on file  Social History Narrative  . Not on file    Past Medical History, Surgical history, Social history, and Family history were reviewed and updated as appropriate.   Rebecca Novak was in rehab awhile.  Back home.  Has terminal CA.  He's now dependent on her and she's always been dependent on him in the past.  She doesn't prepare meals.  Please see review of systems for further details on the patient's review from today.   Objective:   Physical Exam:  There were no vitals taken for this visit.  Physical Exam  Neurological: She displays no tremor. Gait normal.  Psychiatric: Her mood appears not anxious. Her affect is inappropriate. Her affect is not angry. Her speech is not rapid and/or pressured, not delayed and not slurred. She is not agitated, not aggressive, not hyperactive, not withdrawn and not actively hallucinating. Thought content is not paranoid and not delusional. She does not exhibit a depressed mood. She expresses no homicidal and no suicidal ideation. She is attentive.  MMSE 27/30, recall 1/3, reduced attention.  Lab Review:      Component Value Date/Time   NA 145 02/24/2018 0547   K 3.5 02/24/2018 0547   CL 111 02/24/2018 0547   CO2 26 02/24/2018 0547   GLUCOSE 101 (H) 02/24/2018 0547   GLUCOSE 86 05/05/2006 0904   BUN 18 02/24/2018 0547   CREATININE 0.88 02/24/2018 0547   CREATININE 0.96 (H) 06/26/2017 1610   CALCIUM 8.6 (L) 02/24/2018 0547   PROT 5.8 (L) 02/24/2018 0547   ALBUMIN 3.3 (L) 02/24/2018 0547   AST 21 02/24/2018 0547   ALT 10 02/24/2018 0547   ALKPHOS 62 02/24/2018 0547   BILITOT 0.8 02/24/2018 0547   GFRNONAA >60 02/24/2018 0547   GFRAA >60 02/24/2018 0547       Component Value Date/Time   WBC 6.0 02/24/2018 0547   RBC 3.70 (  L) 02/24/2018 0547   RBC 3.70 (L) 02/24/2018 0547   HGB 10.8 (L) 02/24/2018 0547   HCT 33.4 (L) 02/24/2018 0547   HCT 34.9 01/18/2018 0549   PLT 232 02/24/2018 0547   MCV 90.3 02/24/2018 0547   MCH 29.2 02/24/2018 0547   MCHC 32.3 02/24/2018 0547   RDW 14.4 02/24/2018 0547   LYMPHSABS 1.7 02/24/2018 0547   MONOABS 0.7 02/24/2018 0547   EOSABS 0.1 02/24/2018 0547   BASOSABS 0.0 02/24/2018 0547    No results found for: POCLITH, LITHIUM   Lab Results  Component Value Date   CBMZ 3.9 (L) 02/25/2018     .res Assessment: Plan:    Bipolar I disorder ([place])  Generalized anxiety disorder  Mild cognitive impairment  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   Future Appointments  Date Time Provider [place]  04/15/2018 10:00 AM Isaac Bliss, Rayford Halsted, MD [id number]    No orders of the defined types were placed in this encounter.  Greater than 50% of face to face time with patient was spent on counseling and coordination of care. We discussed recent 2 hospitalizations for delirium and weakness and dehydration.  A part of the problem is poor eating and poor and diet because can is no longer able to prepare meals.  It is unclear whether she is taking the medicines  incorrectly leading to the high carbamazepine levels.  Now it is low after reducing the dose. Recent delirium has resolved.  But she does have significant short-term memory problems. CBZ levels have been fluctuating wildly for unclear reasons. It is now possibly subtherapeutic but we can't risk another high level at this time with her losing weight. Will try to keep the meds at the lowest possible levels as risk of mood swings appears less than risk of med toxicity based on recent hospitalizations. She's forgotten to take [place] with food.  Needs to improve nutrition. Emphasized this.  If she does not she is at risk of repeated delirium.  Also emphasized getting adequate intake. 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  Work on coping strategies for memory problems.  We will make no med changes this visit  FU 30 mins in 4 weeks  This was appt was 40 mins.  Lynder Parents, Md, DFAPA      -------------------------------

## 2018-03-26 NOTE — Patient Instructions (Signed)
Eat at least 350 calories with the [place] every time. Call if the confusion gets worse. Make sure you take the meds very consistently. Call if there are any side effects or mood changes.

## 2018-03-29 ENCOUNTER — Other Ambulatory Visit: Payer: Self-pay

## 2018-03-29 DIAGNOSIS — G92 Toxic encephalopathy: Secondary | ICD-10-CM | POA: Diagnosis not present

## 2018-03-29 DIAGNOSIS — T421X1D Poisoning by iminostilbenes, accidental (unintentional), subsequent encounter: Secondary | ICD-10-CM | POA: Diagnosis not present

## 2018-03-29 NOTE — Telephone Encounter (Signed)
Verifying equetro 200mg  capsules 1 every morning and 2 at bedtime with [place].

## 2018-03-30 DIAGNOSIS — G92 Toxic encephalopathy: Secondary | ICD-10-CM | POA: Diagnosis not present

## 2018-03-30 DIAGNOSIS — T421X1D Poisoning by iminostilbenes, accidental (unintentional), subsequent encounter: Secondary | ICD-10-CM | POA: Diagnosis not present

## 2018-03-31 DIAGNOSIS — T421X1D Poisoning by iminostilbenes, accidental (unintentional), subsequent encounter: Secondary | ICD-10-CM | POA: Diagnosis not present

## 2018-03-31 DIAGNOSIS — G92 Toxic encephalopathy: Secondary | ICD-10-CM | POA: Diagnosis not present

## 2018-04-02 ENCOUNTER — Other Ambulatory Visit: Payer: Self-pay

## 2018-04-02 ENCOUNTER — Telehealth: Payer: Self-pay | Admitting: Family Medicine

## 2018-04-02 NOTE — Telephone Encounter (Signed)
Copied from [place]. Topic: Quick Communication - See Telephone Encounter >> Apr 02, 2018  3:39 PM Blase Mess A wrote: CRM for notification. See Telephone encounter for: 04/02/18. Denise Difilippi, Nurse from [place] is calling to report that the patient has fallen 4 times last night from [place] to the night. Patient denies loosing consciousness. Patient does has bruising right buttock and right upper arm, right forearm, and right knee. Patient has scheduled an appt.

## 2018-04-02 NOTE — Telephone Encounter (Signed)
Please advise 

## 2018-04-03 NOTE — Progress Notes (Signed)
Rebecca Novak DOB: 03/17/1946 Encounter date: 04/05/2018  This is a 72 y.o. female who presents with Chief Complaint  Patient presents with  . Fall    fell 4 times thursday afternoon and that night, brusies     History of present illness: Some anemia in 10/19; low protein Saw psychiatry on 11/1; no med changes made.  Thursday afternoon had fall and then multiple more through night. Not light headed, not dizzy, not uncoordinated. Did get some bruises. Fell in hallway, fell at front door when checking to see if they got mail. [place] flooring. No falls since then. No LOC; did hit head on floor with one of falls. Right arm is sore. Rear end is sore. All falls were related to change in position (sitting to standing and walking)  Never had falls like this before. Very unusual for her.   Partner has someone checking on him on weekly basis and she states that that nurse also checks on her. Partner's health is not good. She tries to manage for both of them.   Doesn't typically check blood pressures at home.   She states she makes microwave meals typically for her and significant other. She eats lunch and dinner daily. She currently does not have working wash machine so laundry is an issue (states she is planning to go purchase wash machine this week) but she is able to care for self, assist significant other. She does have some confusion still with current doses of medication but on review does not think she is regularly taking metoprolol (states that since labeled prn she pulled it out of regular medications).      No Known Allergies Current Meds  Medication Sig  . carbamazepine (EQUETRO) 200 MG CP12 12 hr capsule Take 1 capsule (200 mg total) by mouth daily AND 2 capsules (400 mg total) at bedtime.  Marland Kitchen levothyroxine (SYNTHROID) 88 MCG tablet Take 1 tablet (88 mcg total) by mouth daily.  . Lurasidone HCl (LATUDA) 120 MG TABS Take 1 tablet (120 mg total) by mouth every evening.  .  metoprolol tartrate (LOPRESSOR) 25 MG tablet Take 1 tablet (25 mg total) by mouth daily as needed (palpitations).  . QUEtiapine (SEROQUEL) 100 MG tablet Take 1 tablet (100 mg total) by mouth at bedtime.  . simvastatin (ZOCOR) 40 MG tablet Take 1 tablet (40 mg total) by mouth at bedtime.    Review of Systems  Constitutional: Negative for chills, fatigue and fever.  Respiratory: Negative for cough, chest tightness, shortness of breath and wheezing.   Cardiovascular: Negative for chest pain, palpitations and leg swelling.  Musculoskeletal: Negative for arthralgias, back pain and gait problem.  Neurological: Negative for dizziness, tremors, syncope, speech difficulty, weakness, light-headedness, numbness and headaches.  Psychiatric/Behavioral: Negative for confusion. The patient is not nervous/anxious.     Objective:  BP (!) 100/50 (BP Location: Left Arm, Patient Position: Sitting, Cuff Size: Normal)   Pulse 67   Temp 98 F (36.7 C) (Oral)   Wt 167 lb 9.6 oz (76 kg)   SpO2 97%   BMI 25.48 kg/m   Weight: 167 lb 9.6 oz (76 kg)   BP Readings from Last 3 Encounters:  04/05/18 (!) 100/50  03/04/18 118/62  02/25/18 119/67   Wt Readings from Last 3 Encounters:  04/05/18 167 lb 9.6 oz (76 kg)  03/04/18 167 lb 3.2 oz (75.8 kg)  02/23/18 166 lb (75.3 kg)    Physical Exam  Constitutional: She is oriented to person, place, and time.  She appears well-developed and well-nourished. No distress.  HENT:  Head: Normocephalic and atraumatic.  Right Ear: External ear normal.  Left Ear: External ear normal.  Mouth/Throat: Oropharynx is clear and moist.  Eyes: Pupils are equal, round, and reactive to light. Conjunctivae are normal.  Neck: Neck supple. No thyromegaly present.  Cardiovascular: Normal rate, regular rhythm and normal heart sounds. Exam reveals no gallop and no friction rub.  No murmur heard. Pulmonary/Chest: Effort normal and breath sounds normal. No respiratory distress. She has no  wheezes. She has no rales.  Musculoskeletal:  Full ROM of shoulders, elbows without pain.   Lymphadenopathy:    She has no cervical adenopathy.  Neurological: She is alert and oriented to person, place, and time. She has normal strength. She is not disoriented. She displays no atrophy, no tremor and normal reflexes. No cranial nerve deficit. She exhibits normal muscle tone. She displays a negative Romberg sign. Coordination and gait normal.  Reflex Scores:      Tricep reflexes are 2+ on the right side and 2+ on the left side.      Bicep reflexes are 2+ on the right side and 2+ on the left side.      Brachioradialis reflexes are 2+ on the right side and 2+ on the left side.      Patellar reflexes are 2+ on the right side and 2+ on the left side. Skin: Skin is warm and dry. She is not diaphoretic.  Echymosis right upper arm, forearm, left side forehead, left buttock, anterior left thigh and anterior shins. All in resolving phases - colored yellow.  Psychiatric: She has a normal mood and affect. Her behavior is normal.    Assessment/Plan 1. Anemia, unspecified type Will plan to recheck levels in a week.  - CBC with Differential/Platelet; Future - Folate; Future  2. Fall, initial encounter She has not fallen since Thursday. She really is much more coherent today in the office and aware of surrounding than she seemed at previous visit. Her neurological exam is quite good with normal strength and coordination for me today. We discussed taking time with position changes. We discussed making sure to  Be eating all meals in a day and adding in protein supplementation to work on maintaining strength. Could certainly consider home PT for strength maintenance.  - Comprehensive metabolic panel; Future  3. B12 deficiency B12 was fairly low when checked during hospitalization; will give injection today and send over supplement for her. Plan to recheck levels in 1-2 weeks. - Vitamin B12; Future -  cyanocobalamin ((VITAMIN B-12)) injection 1,000 mcg  4. Hypothyroidism, unspecified type Overtreated on previous labwork. Will recheck in 1-2 weeks.  - TSH; Future  5. Folate deficiency  - Folate; Future  6. Raised Tegretol level She has clinically improved since her last exam. I do not suspect issue with elevated levels at this point. However, due to falls will add on to future bloodwork.  - Carbamazepine level, total  Her time was limited today due to needing to get significant other to doctor appointment.   Return in about 1 week (around 04/12/2018), or bloodwork in 1 week; follow up pending this.    Micheline Rough, MD

## 2018-04-05 ENCOUNTER — Ambulatory Visit (INDEPENDENT_AMBULATORY_CARE_PROVIDER_SITE_OTHER): Payer: Medicare Other | Admitting: Family Medicine

## 2018-04-05 ENCOUNTER — Encounter: Payer: Self-pay | Admitting: Family Medicine

## 2018-04-05 VITALS — BP 100/50 | HR 67 | Temp 98.0°F | Wt 167.6 lb

## 2018-04-05 DIAGNOSIS — E538 Deficiency of other specified B group vitamins: Secondary | ICD-10-CM | POA: Diagnosis not present

## 2018-04-05 DIAGNOSIS — S59911A Unspecified injury of right forearm, initial encounter: Secondary | ICD-10-CM | POA: Diagnosis not present

## 2018-04-05 DIAGNOSIS — R7889 Finding of other specified substances, not normally found in blood: Secondary | ICD-10-CM | POA: Diagnosis not present

## 2018-04-05 DIAGNOSIS — D649 Anemia, unspecified: Secondary | ICD-10-CM

## 2018-04-05 DIAGNOSIS — E039 Hypothyroidism, unspecified: Secondary | ICD-10-CM

## 2018-04-05 DIAGNOSIS — W19XXXA Unspecified fall, initial encounter: Secondary | ICD-10-CM

## 2018-04-05 MED ORDER — CYANOCOBALAMIN 1000 MCG/ML IJ SOLN
1000.0000 ug | Freq: Once | INTRAMUSCULAR | Status: AC
  Administered 2018-04-05: 1000 ug via INTRAMUSCULAR

## 2018-04-05 NOTE — Patient Instructions (Addendum)
Check metoprolol and if you need to take it for palpitations then take only 1/2 tablet.   Complete bloodwork in 1 weeks time. Call me sooner if any concerns, changes in symptoms. Follow up will be pending lab results.

## 2018-04-05 NOTE — Telephone Encounter (Signed)
She is on our schedule today so will try to address this issue; however we have had a difficult time getting in touch with home nurse. I will see how visit goes today and will try to touch base with Langley Gauss myself today regarding patient and any safety issues. We had reached out earlier due to concerns of safety seen on initial home nurse eval report. If Langley Gauss calls in meanwhile please let me know!

## 2018-04-06 ENCOUNTER — Telehealth: Payer: Self-pay | Admitting: Family Medicine

## 2018-04-06 ENCOUNTER — Telehealth: Payer: Self-pay

## 2018-04-06 DIAGNOSIS — G92 Toxic encephalopathy: Secondary | ICD-10-CM | POA: Diagnosis not present

## 2018-04-06 DIAGNOSIS — T421X1D Poisoning by iminostilbenes, accidental (unintentional), subsequent encounter: Secondary | ICD-10-CM | POA: Diagnosis not present

## 2018-04-06 NOTE — Telephone Encounter (Signed)
Caren Macadam, MD  Rebecca Eaton, CMA        I feel reassured after seeing her today that she is making progress and on the right track. I am not sure we have to worry about touching base with home health at this point because she really is much more clinically improved. HOWEVER, she still wasn't sure about med doses, so if pharmacy needs clarification that would best be done by home health in the house with direct visual on what she is taking.

## 2018-04-06 NOTE — Telephone Encounter (Signed)
Copied from [place] [id number]. Topic: Quick Communication - Home Health Verbal Orders >> Apr 06, 2018  3:04 PM Conception Chancy, NT wrote: Caller/Agency: Langley Gauss, RN [place] Number: [phone] Requesting OT/PT/Skilled Nursing/Social Work: Nursing Frequency: 2 additional nurse visits

## 2018-04-06 NOTE — Telephone Encounter (Signed)
Rx request was faxed back with a note asking them to contact home health. Nothing further needed.

## 2018-04-07 ENCOUNTER — Telehealth: Payer: Self-pay | Admitting: Family Medicine

## 2018-04-07 NOTE — Telephone Encounter (Signed)
Spoke to [place] and verbal orders given. She will call back with the medication list. CRM

## 2018-04-07 NOTE — Telephone Encounter (Signed)
Langley Gauss, RN from [place] called back with  the medication list of the patient. The medications are all the same with one difference: the metoprolol tartrate 25 mg is written to take one daily as needed for palpitations. The patient is taking one everyday. All the rest are being taking as written.

## 2018-04-07 NOTE — Telephone Encounter (Signed)
[facility] for order. Please have home nurse CALL us or at least fax [place] updated med list (what patient is actually taking at home). She is unable to tell us this in office and we need to make sure we are on the same page.

## 2018-04-08 NOTE — Telephone Encounter (Signed)
Spoke with Rebecca Novak. She is aware of the instructions given by Dr. Ethlyn Gallery and says that she will monitor the patients progress.

## 2018-04-08 NOTE — Telephone Encounter (Signed)
Have her hold the metoprolol. She can restart half tab if palpitations occur. But her bp is quite low and I would like to leave off for now.

## 2018-04-13 DIAGNOSIS — T421X1D Poisoning by iminostilbenes, accidental (unintentional), subsequent encounter: Secondary | ICD-10-CM | POA: Diagnosis not present

## 2018-04-13 DIAGNOSIS — G92 Toxic encephalopathy: Secondary | ICD-10-CM | POA: Diagnosis not present

## 2018-04-15 ENCOUNTER — Encounter: Payer: Medicare Other | Admitting: Internal Medicine

## 2018-04-20 DIAGNOSIS — T421X1D Poisoning by iminostilbenes, accidental (unintentional), subsequent encounter: Secondary | ICD-10-CM | POA: Diagnosis not present

## 2018-04-20 DIAGNOSIS — G92 Toxic encephalopathy: Secondary | ICD-10-CM | POA: Diagnosis not present

## 2018-04-21 DIAGNOSIS — D0461 Carcinoma in situ of skin of right upper limb, including shoulder: Secondary | ICD-10-CM | POA: Diagnosis not present

## 2018-04-26 DIAGNOSIS — G92 Toxic encephalopathy: Secondary | ICD-10-CM | POA: Diagnosis not present

## 2018-04-26 DIAGNOSIS — T421X1D Poisoning by iminostilbenes, accidental (unintentional), subsequent encounter: Secondary | ICD-10-CM | POA: Diagnosis not present

## 2018-05-05 ENCOUNTER — Encounter: Payer: Self-pay | Admitting: Psychiatry

## 2018-05-05 ENCOUNTER — Ambulatory Visit (INDEPENDENT_AMBULATORY_CARE_PROVIDER_SITE_OTHER): Payer: Medicare Other | Admitting: Psychiatry

## 2018-05-05 DIAGNOSIS — F3132 Bipolar disorder, current episode depressed, moderate: Secondary | ICD-10-CM

## 2018-05-05 DIAGNOSIS — F411 Generalized anxiety disorder: Secondary | ICD-10-CM | POA: Diagnosis not present

## 2018-05-05 DIAGNOSIS — G3184 Mild cognitive impairment, so stated: Secondary | ICD-10-CM | POA: Diagnosis not present

## 2018-05-05 DIAGNOSIS — R7989 Other specified abnormal findings of blood chemistry: Secondary | ICD-10-CM

## 2018-05-05 NOTE — Progress Notes (Signed)
[name] [id number] [birth year] 72 y.o.  Subjective:   Patient ID:  Rebecca Novak is a 72 y.o. (DOB 1945/07/24) female.  Chief Complaint:  Chief Complaint  Patient presents with  . Follow-up    Medication Management   Altered Mental Status   . Depression  . Anxiety  . Memory Loss  . Medication Problem  . Stress  . Sleeping Problem     HPI Rebecca Novak presents to the office today for follow-up of above and bipolar disorder.  Pt denies recent episodes of confusion since here.  Still not eating well.  Disc this is complication for the meds.  Denies mood swings since the reduciton of CBZ.  Multiple problems. 2 recent hospitalizations (last 02/23/18) DT confusion and with high CBZ levels, the last was 13.9, but she was also dehydrated.  She's losing unknown amount of weight and c/o trouble going to sleep.  Claims she gets 8 hours.  Wants 9 hours.  Appetite seems OK, pushing herself to eat.   Today Patient reports stable mood and denies depressed or irritable moods.  Patient denies any recent difficulty with anxiety.  Patient denies difficulty with sleep maintenance, but compains of difficulty falling asleep. Denies appetite disturbance.  Patient reports that energy and motivation have been good.  Patient denies any difficulty with concentration.  Patient denies any suicidal ideation.  Disc stressors of Christmas.  Denies confusion but aware of being forgetful.  Denies forgetting meds bc it's routine and using pill box.Rebecca Novak no longer able to cook and he did this for them before and Rebecca Novak, sister, says pt eating poorly as a result.  Review of Systems:  Review of Systems  Constitutional: Positive for activity change, appetite change, fatigue and unexpected weight change.  Gastrointestinal: Negative.   Neurological: Positive for weakness. Negative for tremors.  Psychiatric/Behavioral: Positive for confusion, decreased concentration, dysphoric mood and  sleep disturbance. Negative for agitation, behavioral problems, hallucinations, self-injury and suicidal ideas. The patient is not nervous/anxious and is not hyperactive.   Not dizzy. I'm not convinced she's taking meds correctly.  Medications: I have reviewed the patient's current medications.  I'm not convinced she's taking meds correctly.  She doesn't really know what she's taking.  Current Outpatient Medications  Medication Sig Dispense Refill  . levothyroxine (SYNTHROID) 88 MCG tablet Take 1 tablet (88 mcg total) by mouth daily. 30 tablet 1  . Lurasidone HCl (LATUDA) 120 MG TABS Take 1 tablet (120 mg total) by mouth every evening. 90 tablet 1  . metoprolol tartrate (LOPRESSOR) 25 MG tablet Take 1 tablet (25 mg total) by mouth daily as needed (palpitations). 90 tablet 3  . QUEtiapine (SEROQUEL) 100 MG tablet Take 1 tablet (100 mg total) by mouth at bedtime. 90 tablet 0  . simvastatin (ZOCOR) 40 MG tablet Take 1 tablet (40 mg total) by mouth at bedtime. 90 tablet 0  . carbamazepine (EQUETRO) 200 MG CP12 12 hr capsule Take 1 capsule (200 mg total) by mouth daily AND 2 capsules (400 mg total) at bedtime. 90 capsule 1   No current facility-administered medications for this visit.     Medication Side Effects: None  Allergies: No Known Allergies  Past Medical History:  Diagnosis Date  . ANEMIA, MILD 05/17/2008  . COLONIC POLYPS, HX OF 05/17/2007  . Depression    bipolar  . GLUCOSE INTOLERANCE 11/23/2007  . HAND PAIN 07/31/2008  . HYPERLIPIDEMIA 02/05/2007  . HYPOTHYROIDISM 02/05/2007  . Skin cancer    "  scraped off chest and RLE" (02/15/2013)  . SYSTOLIC MURMUR 28/31/5176    Family History  Problem Relation Age of Onset  . Dementia Mother   . Healthy Father        until death at age 85  . Mental illness Brother   . Healthy Sister     Social History   Socioeconomic History  . Marital status: Married    Spouse name: Rebecca Novak  . Number of children: 0  . Years of education:  Not on file  . Highest education level: Not on file  Occupational History  . 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 Financial resource strain: Not hard at all  . Food insecurity:    Worry: Never true    Inability: Never true  . Transportation needs:    Medical: No    Non-medical: No  Tobacco Use  . Smoking status: Former Smoker    Packs/day: 0.33    Years: 44.00    Pack years: 14.52    Types: Cigarettes  . Smokeless tobacco: Never Used  . Tobacco comment: 02/15/2013 "stopped smoking cigarettes ~ 7 yr ago"  Substance and Sexual Activity  . Alcohol use: No  . Drug use: No  . Sexual activity: Yes  Lifestyle  . Physical activity:    Days per week: Not on file    Minutes per session: Not on file  . Stress: Not on file  Relationships  . Social connections:    Talks on phone: Patient refused    Gets together: Patient refused    Attends religious service: Patient refused    Active member of club or organization: Patient refused    Attends meetings of clubs or organizations: Patient refused    Relationship status: Patient refused  . Intimate partner violence:    Fear of current or ex partner: No    Emotionally abused: No    Physically abused: No    Forced sexual activity: No  Other Topics Concern  . Not on file  Social History Narrative  . Not on file    Past Medical History, Surgical history, Social history, and Family history were reviewed and updated as appropriate.   [name] was in rehab awhile.  Back home.  Has terminal CA.  He's now dependent on her and she's always been dependent on him in the past.  She doesn't prepare meals.  Please see review of systems for further details on the patient's review from today.   Objective:   Physical Exam:  There were no vitals taken for this visit.  Physical Exam  Neurological: She displays no tremor. Gait normal.  Psychiatric: Her mood appears not anxious. Her affect is inappropriate.  Her affect is not angry. Her speech is not rapid and/or pressured, not delayed and not slurred. She is not agitated, not aggressive, not hyperactive, not withdrawn and not actively hallucinating. Thought content is not paranoid and not delusional. She does not exhibit a depressed mood. She expresses no homicidal and no suicidal ideation. She is attentive.  MMSE 27/30, recall 1/3, reduced attention on 03/26/18  Lab Review:     Component Value Date/Time   NA 145 02/24/2018 0547   K 3.5 02/24/2018 0547   CL 111 02/24/2018 0547   CO2 26 02/24/2018 0547   GLUCOSE 101 (H) 02/24/2018 0547   GLUCOSE 86 05/05/2006 0904   BUN 18 02/24/2018 0547   CREATININE 0.88 02/24/2018 0547   CREATININE 0.96 (  H) 06/26/2017 1610   CALCIUM 8.6 (L) 02/24/2018 0547   PROT 5.8 (L) 02/24/2018 0547   ALBUMIN 3.3 (L) 02/24/2018 0547   AST 21 02/24/2018 0547   ALT 10 02/24/2018 0547   ALKPHOS 62 02/24/2018 0547   BILITOT 0.8 02/24/2018 0547   GFRNONAA >60 02/24/2018 0547   GFRAA >60 02/24/2018 0547       Component Value Date/Time   WBC 6.0 02/24/2018 0547   RBC 3.70 (L) 02/24/2018 0547   RBC 3.70 (L) 02/24/2018 0547   HGB 10.8 (L) 02/24/2018 0547   HCT 33.4 (L) 02/24/2018 0547   HCT 34.9 01/18/2018 0549   PLT 232 02/24/2018 0547   MCV 90.3 02/24/2018 0547   MCH 29.2 02/24/2018 0547   MCHC 32.3 02/24/2018 0547   RDW 14.4 02/24/2018 0547   LYMPHSABS 1.7 02/24/2018 0547   MONOABS 0.7 02/24/2018 0547   EOSABS 0.1 02/24/2018 0547   BASOSABS 0.0 02/24/2018 0547    No results found for: POCLITH, LITHIUM   Lab Results  Component Value Date   CBMZ 3.9 (L) 02/25/2018     .res Assessment: Plan:    Bipolar 1 disorder, depressed, moderate (HCC)  Generalized anxiety disorder  Mild cognitive impairment  Low vitamin D level  Please see After Visit Summary for patient specific instructions.   Future Appointments  Date Time Provider Leslie  05/07/2018  4:00 PM Caren Macadam, MD  [facility]  06/24/2018  1:30 PM Cottle, Billey Co., MD [facility] None    No orders of the defined types were placed in this encounter.   I'm not convinced she's taking meds correctly.  Call back with the current dosage of carbamazepine you are taking.  Greater than 50% of face to face time with patient was spent on counseling and coordination of care. We discussed recent 2 hospitalizations for delirium and weakness and dehydration.  A part of the problem is poor eating and poor and diet because can is no longer able to prepare meals.  It is unclear whether she is taking the medicines incorrectly leading to the high carbamazepine levels.  Now it is low after reducing the dose. Recent delirium has resolved.  But she does have significant short-term memory problems.  She cannot remember what she is taking.  Bring meds to next appointment.  CBZ levels have been fluctuating wildly for unclear reasons. It is now possibly subtherapeutic but we can't risk another high level at this time with her losing weight. Will try to keep the meds at the lowest possible levels as risk of mood swings appears less than risk of med toxicity based on recent hospitalizations.  She's forgotten to take [place] with food.  Needs to improve nutrition. Emphasized this.  If she does not she is at risk of repeated delirium.  Also emphasized getting adequate intake. Supportive therapy on dealing with Novak's CA and illness.  Work on coping strategies for memory problems.  We will make no med changes this visit  FU 30 mins in 4 weeks  This was appt was 30 mins.  Lynder Parents, Md, DFAPA      -------------------------------

## 2018-05-05 NOTE — Patient Instructions (Addendum)
Call back with a report of how many Carbamazepine capsules you are taking each day.  Remember to take the Latuda with a meal  Bring meds to next appointment.

## 2018-05-07 ENCOUNTER — Encounter: Payer: Medicare Other | Admitting: Family Medicine

## 2018-05-14 ENCOUNTER — Other Ambulatory Visit: Payer: Self-pay | Admitting: Psychiatry

## 2018-05-17 ENCOUNTER — Other Ambulatory Visit: Payer: Self-pay

## 2018-05-17 ENCOUNTER — Telehealth: Payer: Self-pay | Admitting: Psychiatry

## 2018-05-17 DIAGNOSIS — F3162 Bipolar disorder, current episode mixed, moderate: Secondary | ICD-10-CM

## 2018-05-17 MED ORDER — CARBAMAZEPINE ER 200 MG PO CP12: ORAL_CAPSULE | ORAL | 0 refills | Status: DC

## 2018-05-17 NOTE — Telephone Encounter (Signed)
escribed medication to [facility].

## 2018-05-17 NOTE — Telephone Encounter (Signed)
Pt called requested refill Equetro 200mg  3/d. Need 90 day supply at [place]. Next appt 06/24/18. Has about 5 days left

## 2018-05-21 ENCOUNTER — Other Ambulatory Visit: Payer: Self-pay | Admitting: Psychiatry

## 2018-05-24 ENCOUNTER — Telehealth: Payer: Self-pay | Admitting: Psychiatry

## 2018-05-24 NOTE — Telephone Encounter (Signed)
Please ask details.  How many hours of sleep is she averaging each night.  At her last visit she was complaining about her sleep but she was sleeping 8 hours nightly.  She said she wanted to sleep 9 hours.  That was not reason enough to prescribe a sleeping med.  Please verify the number of hours per night she is sleeping  Also please verify that she is taking the quetiapine 100 mg each night.  That should be sufficient for her sleep.  Let me know if there is additional questions or information.  Lynder Parents, MD, DFAPA

## 2018-05-24 NOTE — Telephone Encounter (Signed)
Pt has requested a sleep medication. Says she is not sleeping well.   Please send to: [address]

## 2018-05-25 NOTE — Telephone Encounter (Signed)
Called Pt. At 12:00. She verbalized understanding and will increase the Seroquel to 1-1/2 of the 100 mg tablet at night. She stated she would try it and if this did not work or had any side effects she would call the office back on Thursday.

## 2018-05-25 NOTE — Telephone Encounter (Signed)
Pt called back and she is taking Seroquel for sure. It is in her nightly dosing tray. Needs help with sleep.

## 2018-05-25 NOTE — Telephone Encounter (Signed)
Rebecca Novak talked to pt. I don't know if you told Albina Billet to check her inbox for calls? Gaile is sleeping about 5 1/2 hrs per night but could not verify she is taking Seroquel 100mg  at bedtime. I told her to make sure she takes it every night and try it for the next two nights to see if it helps. Then let us know Thursday is she is sleeping better. I instructed her that it helps with sleep as you said.

## 2018-05-25 NOTE — Telephone Encounter (Signed)
Regarding Catherine's complaint about inadequate sleep despite taking Seroquel 100 mg at night.  Rather than add an additional medication which is likely to multiply side effects we will instead agreed to increase the Seroquel to 1-1/2 of the 100 mg tablets each night.  Let us know if there are any side effects such as excessive sedation in the morning or balance problems.  Please communicate this information to the patient.  Lynder Parents, MD, DFAPA

## 2018-05-27 ENCOUNTER — Telehealth: Payer: Self-pay | Admitting: Psychiatry

## 2018-05-27 NOTE — Telephone Encounter (Signed)
Given instructions to increase to 200 mg night. Verbalized understanding.

## 2018-05-27 NOTE — Telephone Encounter (Signed)
Rebecca Novak called to report that she added a 1/2 tablet of seroquel to help with sleep, but it hasn't helped.  She is still not sleeping.  What else can she do?  Please call.  Next appt is 1/30.

## 2018-05-27 NOTE — Telephone Encounter (Signed)
Increase by another 1/2 tablet to 200 mg Seroquel each night.

## 2018-05-27 NOTE — Telephone Encounter (Signed)
Please advise. Thank you

## 2018-06-04 ENCOUNTER — Telehealth: Payer: Self-pay | Admitting: Psychiatry

## 2018-06-04 NOTE — Telephone Encounter (Signed)
Please advise, spoke to her 01/02 to increase dose. I did suggest waiting 2 weeks.

## 2018-06-04 NOTE — Telephone Encounter (Signed)
Has increased Seroquel and taken for about a week.  Still not sleeping , was still awake at 5am this am.  Please call.

## 2018-06-04 NOTE — Telephone Encounter (Signed)
Patient has been confused and forgetful.  I doubt her history that she is not sleeping on 200 mg Seroquel.  I suspect poor sleep hygiene and that she is napping daytime.  Please make sure she is taking the [place] with food in the evening.  She was forgetting that at last appt.  No further med changes until she's seen.  Put her on cancellation list.  Lynder Parents, MD, DFAPA

## 2018-06-06 NOTE — Progress Notes (Deleted)
Rebecca Novak DOB: 05-13-1946 Encounter date: 06/07/2018  This is a 73 y.o. female who presents to establish care. No chief complaint on file.   History of present illness: (recent note from Dr. Clovis Pu, psychiatry, requesting repeat visit to discuss sleeping issues. Wanted her  Reminded to take the [place] with food in evening and continuing with Seroquel 200mg  in evening. Last seen by Dr. Clovis Pu 05/05/18  *** Did not complete bloodwork ordered at last visit.   Past Medical History:  Diagnosis Date  . ANEMIA, MILD 05/17/2008  . COLONIC POLYPS, HX OF 05/17/2007  . Depression    bipolar  . GLUCOSE INTOLERANCE 11/23/2007  . HAND PAIN 07/31/2008  . HYPERLIPIDEMIA 02/05/2007  . HYPOTHYROIDISM 02/05/2007  . Skin cancer    "scraped off chest and RLE" (02/15/2013)  . SYSTOLIC MURMUR 27/10/2374   Past Surgical History:  Procedure Laterality Date  . APPENDECTOMY    . TONSILLECTOMY    . VAGINAL HYSTERECTOMY     No Known Allergies No outpatient medications have been marked as taking for the 06/07/18 encounter (Appointment) with Caren Macadam, MD.   Social History   Tobacco Use  . Smoking status: Former Smoker    Packs/day: 0.33    Years: 44.00    Pack years: 14.52    Types: Cigarettes  . Smokeless tobacco: Never Used  . Tobacco comment: 02/15/2013 "stopped smoking cigarettes ~ 7 yr ago"  Substance Use Topics  . Alcohol use: No   Family History  Problem Relation Age of Onset  . Dementia Mother   . Healthy Father        until death at age 69  . Mental illness Brother   . Healthy Sister      Review of Systems  Objective:  There were no vitals taken for this visit.      BP Readings from Last 3 Encounters:  04/05/18 (!) 100/50  03/04/18 118/62  02/25/18 119/67   Wt Readings from Last 3 Encounters:  04/05/18 167 lb 9.6 oz (76 kg)  03/04/18 167 lb 3.2 oz (75.8 kg)  02/23/18 166 lb (75.3 kg)    Physical Exam  Assessment/Plan:  There are no diagnoses  linked to this encounter.  No follow-ups on file.  Micheline Rough, MD

## 2018-06-07 ENCOUNTER — Encounter: Payer: Medicare Other | Admitting: Family Medicine

## 2018-06-07 DIAGNOSIS — Z0289 Encounter for other administrative examinations: Secondary | ICD-10-CM

## 2018-06-08 NOTE — Telephone Encounter (Signed)
Given message and put on cancellation list with provider. Verbalized understanding.

## 2018-06-24 ENCOUNTER — Encounter

## 2018-06-24 ENCOUNTER — Ambulatory Visit: Payer: Medicare Other | Admitting: Psychiatry

## 2018-06-26 ENCOUNTER — Other Ambulatory Visit: Payer: Self-pay | Admitting: Psychiatry

## 2018-06-30 ENCOUNTER — Telehealth: Payer: Self-pay | Admitting: Psychiatry

## 2018-06-30 DIAGNOSIS — M19011 Primary osteoarthritis, right shoulder: Secondary | ICD-10-CM | POA: Diagnosis not present

## 2018-06-30 DIAGNOSIS — M25511 Pain in right shoulder: Secondary | ICD-10-CM | POA: Diagnosis not present

## 2018-06-30 NOTE — Telephone Encounter (Signed)
Pt still not sleeping. New meds not working. Would like something else called in to [facility] on [address]. [phone].

## 2018-06-30 NOTE — Telephone Encounter (Signed)
Pt is already taking a lot of sedative medications and we've tried several things.  I'm not sure what else to do bc I don't want her to start falling again.  I need to see her again before I change any other medication.  Put her on cancellation list for 30 mins.  Lynder Parents, MD, DFAPA

## 2018-06-30 NOTE — Telephone Encounter (Signed)
Please review message

## 2018-07-01 NOTE — Telephone Encounter (Signed)
Spoke with [name] and he said she is sleeping some. Verified she would be added to cxl list.

## 2018-07-06 ENCOUNTER — Other Ambulatory Visit: Payer: Self-pay | Admitting: Internal Medicine

## 2018-07-08 ENCOUNTER — Ambulatory Visit: Payer: Medicare Other | Admitting: Psychiatry

## 2018-07-09 ENCOUNTER — Other Ambulatory Visit: Payer: Self-pay | Admitting: Psychiatry

## 2018-07-09 ENCOUNTER — Telehealth: Payer: Self-pay | Admitting: Psychiatry

## 2018-07-09 ENCOUNTER — Other Ambulatory Visit: Payer: Self-pay

## 2018-07-09 MED ORDER — QUETIAPINE FUMARATE 100 MG PO TABS: 100.0000 mg | ORAL_TABLET | Freq: Every day | ORAL | 1 refills | Status: DC

## 2018-07-09 MED ORDER — QUETIAPINE FUMARATE 100 MG PO TABS: 100.0000 mg | ORAL_TABLET | Freq: Every day | ORAL | 0 refills | Status: DC

## 2018-07-09 NOTE — Telephone Encounter (Signed)
Medication cancelled at [facility]. Rx sent [facility].

## 2018-07-09 NOTE — Telephone Encounter (Signed)
Patient stated pharmacy informed her Quetiapine has been discontinued. She is requesting a new prescription filled at [facility].

## 2018-07-16 ENCOUNTER — Other Ambulatory Visit: Payer: Self-pay

## 2018-07-16 MED ORDER — SIMVASTATIN 40 MG PO TABS: 40.0000 mg | ORAL_TABLET | Freq: Every day | ORAL | 0 refills | Status: DC

## 2018-07-23 ENCOUNTER — Telehealth: Payer: Self-pay | Admitting: Psychiatry

## 2018-07-23 ENCOUNTER — Encounter

## 2018-07-23 ENCOUNTER — Other Ambulatory Visit: Payer: Self-pay

## 2018-07-23 MED ORDER — QUETIAPINE FUMARATE 100 MG PO TABS: 200.0000 mg | ORAL_TABLET | Freq: Every day | ORAL | 1 refills | Status: DC

## 2018-07-23 NOTE — Telephone Encounter (Signed)
Pt taking 2/at bedtime of Quetiapine and is doing well. Wants rx sent to [facility] mail order [phone].

## 2018-07-23 NOTE — Telephone Encounter (Signed)
Message already submitted to provider. Waiting for response.

## 2018-07-23 NOTE — Telephone Encounter (Signed)
Rebecca Novak called again today to request the refill on her quetiapine.  It needs to be sent to [facility] and the new dose/instructions need to be sent.  She is now taking 2 qhs.  A prescription was sent to [facility] 07/09/18 but she doesn't use [facility] for this medicine and the dosing/instruction are wrong.  Has appt 3/11.  Please send rx to [facility]

## 2018-07-23 NOTE — Telephone Encounter (Signed)
rx submitted to [facility] for increase dosage.

## 2018-07-23 NOTE — Telephone Encounter (Signed)
Left message with [name] that rx submitted as requested

## 2018-07-23 NOTE — Telephone Encounter (Signed)
OK quetiapine 100, 2 q HS.

## 2018-07-25 ENCOUNTER — Other Ambulatory Visit: Payer: Self-pay | Admitting: Psychiatry

## 2018-07-26 NOTE — Telephone Encounter (Signed)
Uses mail order

## 2018-07-27 ENCOUNTER — Telehealth: Payer: Self-pay | Admitting: Psychiatry

## 2018-07-27 NOTE — Telephone Encounter (Signed)
Patient called and said that she needs refill of her synthroid of 88 mcg to be escribed to [facility] on [address]

## 2018-07-27 NOTE — Telephone Encounter (Signed)
Glad she clarified because she uses mail order as well. rx submitted

## 2018-08-04 ENCOUNTER — Ambulatory Visit (INDEPENDENT_AMBULATORY_CARE_PROVIDER_SITE_OTHER): Payer: Medicare Other | Admitting: Psychiatry

## 2018-08-04 ENCOUNTER — Encounter: Payer: Self-pay | Admitting: Psychiatry

## 2018-08-04 ENCOUNTER — Other Ambulatory Visit: Payer: Self-pay

## 2018-08-04 VITALS — Wt 185.0 lb

## 2018-08-04 DIAGNOSIS — F3132 Bipolar disorder, current episode depressed, moderate: Secondary | ICD-10-CM | POA: Diagnosis not present

## 2018-08-04 DIAGNOSIS — F411 Generalized anxiety disorder: Secondary | ICD-10-CM

## 2018-08-04 DIAGNOSIS — G3184 Mild cognitive impairment, so stated: Secondary | ICD-10-CM

## 2018-08-04 DIAGNOSIS — F16921 Hallucinogen use, unspecified with intoxication with delirium: Secondary | ICD-10-CM

## 2018-08-04 DIAGNOSIS — R7989 Other specified abnormal findings of blood chemistry: Secondary | ICD-10-CM | POA: Diagnosis not present

## 2018-08-04 NOTE — Progress Notes (Addendum)
Rebecca Novak [id number] [birth year] 73 y.o.  Subjective:   Patient ID:  Rebecca Novak is Rebecca Novak 73 y.o. (DOB Jan 22, 1946) female.  Chief Complaint:  Chief Complaint  Patient presents with  . Follow-up    Medication Managment   Altered Mental Status   . Depression  . Anxiety  . Memory Loss  . Medication Problem  . Stress  . Sleeping Problem     HPI Rebecca Novak presents to the office today for follow-up of above and bipolar disorder.    Last seen May 05, 2018.  She is called multiple times since then complaining of insomnia.  She has Rebecca Novak tendency to overreact to not sleeping 9 hours Rebecca Novak night like she wants to.  At her request the Seroquel was gradually increased to 200 mg nightly.  She requested Rebecca Novak higher dose but that was refused out of concerns about oversedation and potential fall risk such as she has had in the past.  Rebecca Novak but nothing to brag about.  Denies confusion.  Claims compliance with meds.  She remembers calling about sleep.  Takes meds at 6 and to bed at [facility].    Stress of Rebecca Novak saying he won't be here much longer.  Getting weaker.  Uses walker.  Still drinks Rebecca Novak lot and she has to make the drinks for him.  He gets short with her.  Pt denies recent episodes of confusion since here.  Still not eating well.  Disc this is complication for the meds.  Denies mood swings since the reduciton of CBZ.  Multiple problems. 2 recent hospitalizations (last 02/23/18) DT confusion and with high CBZ levels, the last was 13.9, but she was also dehydrated.  She's losing unknown amount of weight and c/o trouble going to sleep.  Claims she gets 8 hours.  Wants 9 hours.  Appetite seems OK, pushing herself to eat.   Today Patient reports stable mood and denies depressed or irritable moods.  Patient denies any recent difficulty with anxiety except about where to move when Rebecca Novak.  Patient denies difficulty with sleep maintenance, but compains of difficulty falling asleep.  Denies appetite disturbance.  Patient reports that energy and motivation have been good.  Patient denies any difficulty with concentration.  Patient denies any suicidal ideation.  Denies confusion but aware of being forgetful.  Denies forgetting meds bc it's routine and using pill box.Rebecca Novak no longer able to cook and he did this for them before and Rebecca Novak, sister, says pt eating poorly as Rebecca Novak result.  Past Psychiatric Medication Trials: Equetro 1200, buspirone, Deplin, Latuda 120, duloxetine, Wellbutrin, Lexapro, sertraline, lithium no response, fluoxetine, Abilify 20, olanzapine, Geodon 160, gabapentin, lamotrigine 100, Depakote, trazodone, Ambien, clonazepam, temazepam, topiramate   Review of Systems:  Review of Systems  Constitutional: Positive for fatigue. Negative for activity change, appetite change and unexpected weight change.  Gastrointestinal: Negative.   Neurological: Negative for tremors and weakness.  Psychiatric/Behavioral: Positive for decreased concentration, dysphoric mood and sleep disturbance. Negative for agitation, behavioral problems, confusion, hallucinations, self-injury and suicidal ideas. The patient is not nervous/anxious and is not hyperactive.   Not dizzy. I'm not convinced she's taking meds correctly.  Medications: I have reviewed the patient's current medications.  I'm not convinced she's taking meds correctly.  She doesn't really know what she's taking.  Current Outpatient Medications  Medication Sig Dispense Refill  . carbamazepine (EQUETRO) 200 MG CP12 12 hr capsule Take 200 mg by mouth. 1 in the morning and 2  at night    . levothyroxine (SYNTHROID, LEVOTHROID) 88 MCG tablet Take 1 tablet by mouth once daily 30 tablet 2  . Lurasidone HCl (LATUDA) 120 MG TABS Take 1 tablet (120 mg total) by mouth every evening. 90 tablet 1  . QUEtiapine (SEROQUEL) 100 MG tablet Take 2 tablets (200 mg total) by mouth at bedtime. 180 tablet 1  . simvastatin (ZOCOR) 40 MG  tablet Take 1 tablet (40 mg total) by mouth at bedtime. 90 tablet 0  . metoprolol tartrate (LOPRESSOR) 25 MG tablet Take 1 tablet (25 mg total) by mouth daily as needed (palpitations). (Patient not taking: Reported on 08/04/2018) 90 tablet 3   No current facility-administered medications for this visit.     Medication Side Effects: None  Allergies: No Known Allergies  Past Medical History:  Diagnosis Date  . ANEMIA, MILD 05/17/2008  . COLONIC POLYPS, HX OF 05/17/2007  . Depression    bipolar  . GLUCOSE INTOLERANCE 11/23/2007  . HAND PAIN 07/31/2008  . HYPERLIPIDEMIA 02/05/2007  . HYPOTHYROIDISM 02/05/2007  . Skin cancer    "scraped off chest and RLE" (02/15/2013)  . SYSTOLIC MURMUR 92/42/6834    Family History  Problem Relation Age of Onset  . Dementia Mother   . Healthy Father        until death at age 23  . Mental illness Brother   . Healthy Sister     Social History   Socioeconomic History  . Marital status: Married    Spouse name: Rebecca Novak  . Number of children: 0  . Years of education: Not on file  . Highest education level: Not on file  Occupational History  . 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 Financial resource strain: Not hard at all  . Food insecurity:    Worry: Never true    Inability: Never true  . Transportation needs:    Medical: No    Non-medical: No  Tobacco Use  . Smoking status: Former Smoker    Packs/day: 0.33    Years: 44.00    Pack years: 14.52    Types: Cigarettes  . Smokeless tobacco: Never Used  . Tobacco comment: 02/15/2013 "stopped smoking cigarettes ~ 7 yr ago"  Substance and Sexual Activity  . Alcohol use: No  . Drug use: No  . Sexual activity: Yes  Lifestyle  . Physical activity:    Days per week: Not on file    Minutes per session: Not on file  . Stress: Not on file  Relationships  . Social connections:    Talks on phone: Patient refused    Gets together: Patient refused     Attends religious service: Patient refused    Active member of club or organization: Patient refused    Attends meetings of clubs or organizations: Patient refused    Relationship status: Patient refused  . Intimate partner violence:    Fear of current or ex partner: No    Emotionally abused: No    Physically abused: No    Forced sexual activity: No  Other Topics Concern  . Not on file  Social History Narrative  . Not on file    Past Medical History, Surgical history, Social history, and Family history were reviewed and updated as appropriate.   Rebecca Novak was in rehab awhile.  Back home.  Has terminal CA.  He's now dependent on her and she's always been dependent on him in the past.  She  doesn't prepare meals.  Please see review of systems for further details on the patient's review from today.   Objective:   Physical Exam:  Wt 185 lb (83.9 kg)   BMI 28.13 kg/m   Physical Exam Neurological:     Motor: No tremor.     Gait: Gait normal.  Psychiatric:        Attention and Perception: She is attentive. She does not perceive auditory hallucinations.        Mood and Affect: Mood is depressed. Mood is not anxious. Affect is not angry or inappropriate.        Speech: Speech is not rapid and pressured, delayed or slurred.        Behavior: Behavior is not agitated, aggressive, withdrawn or hyperactive.        Thought Content: Thought content is not paranoid or delusional. Thought content does not include homicidal or suicidal ideation.        Cognition and Memory: Cognition is impaired. Memory is impaired.     Comments: Fair to poor insight and fair judgment. Still easily confused.  Required multiple instructions to understand where to get lab test.   MMSE 27/30, recall 1/3, reduced attention on 03/26/18  Lab Review:     Component Value Date/Time   NA 145 02/24/2018 0547   K 3.5 02/24/2018 0547   CL 111 02/24/2018 0547   CO2 26 02/24/2018 0547   GLUCOSE 101 (H) 02/24/2018 0547    GLUCOSE 86 05/05/2006 0904   BUN 18 02/24/2018 0547   CREATININE 0.88 02/24/2018 0547   CREATININE 0.96 (H) 06/26/2017 1610   CALCIUM 8.6 (L) 02/24/2018 0547   PROT 5.8 (L) 02/24/2018 0547   ALBUMIN 3.3 (L) 02/24/2018 0547   AST 21 02/24/2018 0547   ALT 10 02/24/2018 0547   ALKPHOS 62 02/24/2018 0547   BILITOT 0.8 02/24/2018 0547   GFRNONAA >60 02/24/2018 0547   GFRAA >60 02/24/2018 0547       Component Value Date/Time   WBC 6.0 02/24/2018 0547   RBC 3.70 (L) 02/24/2018 0547   RBC 3.70 (L) 02/24/2018 0547   HGB 10.8 (L) 02/24/2018 0547   HCT 33.4 (L) 02/24/2018 0547   HCT 34.9 01/18/2018 0549   PLT 232 02/24/2018 0547   MCV 90.3 02/24/2018 0547   MCH 29.2 02/24/2018 0547   MCHC 32.3 02/24/2018 0547   RDW 14.4 02/24/2018 0547   LYMPHSABS 1.7 02/24/2018 0547   MONOABS 0.7 02/24/2018 0547   EOSABS 0.1 02/24/2018 0547   BASOSABS 0.0 02/24/2018 0547    No results found for: POCLITH, LITHIUM   Lab Results  Component Value Date   CBMZ 3.9 (L) 02/25/2018     .res Assessment: Plan:    Bipolar 1 disorder, depressed, moderate ([place]) - Plan: Carbamazepine, Free and Total  Generalized anxiety disorder  Mild cognitive impairment  Low vitamin D level  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   Future Appointments  Date Time Provider [place]  08/27/2018 11:00 AM Caren Macadam, MD [id number]  09/29/2018  2:00 PM Cottle, Billey Co., MD [facility] None    Orders Placed This Encounter  Procedures  . Carbamazepine, Free and Total      Greater than 50% of face to face time with patient was spent on counseling and coordination of care. We discussed recent 2 hospitalizations for delirium and weakness and dehydration.  Rebecca Novak  part of the problem is poor eating and poor and diet because can is no longer able to prepare meals.    Overall her affect is more  appropriate to the circumstances today.  And her mood is reasonably stable.  She is not manic.  She is physically stronger than she was when last seen.  She did bring her medications today as requested.  She did not call back after the last visit as requested to give [place] the information.  She is not taking the Equetro at the prescribed dose but is only taking 2 of the 300 mg capsules at night.  She is also not taking the Latuda with Rebecca Novak meal.  She has not been taking Equetro correctly but is not manic.  I will not change the dose but will check Rebecca Novak level.  Recent delirium has resolved.  But she does have significant short-term memory problems.  She cannot remember what she is taking.  She also fails to read the bottle and take as is prescribed.  Bring meds to all appointment.  CBZ levels have been fluctuating wildly for unclear reasons. Will try to keep the meds at the lowest possible levels as risk of mood swings appears less than risk of med toxicity based on recent hospitalizations.  Not having mood swings.  Mild depression.  She's still not taking Latuda with food  but at bedtime.  Take Latuda with evening meal.  No other  Med changes today.   If she takes the [place] with Rebecca Novak meal it may lessen the need for higher blood levels of Equetro.  Has regained lost weight.  Eating ice cream daily.  Supportive therapy on dealing with Novak's CA and illness.  Work on coping strategies for memory problems.  Her weight weight has varied widely over the past year from 167 up to 200 pounds.  It was 167 pounds in November and is 185 pounds today.  We will make no med changes this visit  FU 30 mins in 8 weeks  This was appt was 30 mins.  Lynder Parents, Md, DFAPA      -------------------------------

## 2018-08-04 NOTE — Patient Instructions (Signed)
Go to [facility] one morning and get a blood test at the lab on [facility]

## 2018-08-13 DIAGNOSIS — M19011 Primary osteoarthritis, right shoulder: Secondary | ICD-10-CM | POA: Diagnosis not present

## 2018-08-13 DIAGNOSIS — M25511 Pain in right shoulder: Secondary | ICD-10-CM | POA: Diagnosis not present

## 2018-08-17 ENCOUNTER — Ambulatory Visit: Payer: Medicare Other | Admitting: Psychiatry

## 2018-08-18 ENCOUNTER — Other Ambulatory Visit: Payer: Self-pay | Admitting: Psychiatry

## 2018-08-19 ENCOUNTER — Telehealth: Payer: Self-pay | Admitting: Psychiatry

## 2018-08-19 NOTE — Telephone Encounter (Signed)
I sent you a message to confirm just wanted to double check with her confusion at times

## 2018-08-19 NOTE — Telephone Encounter (Signed)
Patient stated she spoke with [facility] and it would be best if Dr. Clovis Pu called them to order script for Equetro 200 mg., phone number is [phone]

## 2018-08-27 ENCOUNTER — Encounter: Payer: Medicare Other | Admitting: Family Medicine

## 2018-09-29 ENCOUNTER — Ambulatory Visit: Payer: Medicare Other | Admitting: Psychiatry

## 2018-09-30 ENCOUNTER — Telehealth: Payer: Self-pay | Admitting: Psychiatry

## 2018-09-30 NOTE — Telephone Encounter (Signed)
Sister, [name], called re: Rebecca Novak. She is asking for support to help her with her sister. Needs to get her set up on POA and learning how to handle her own finances, etc. Rebecca Novak lives with a partner and is very reliant on him and need to plan for when partner passes- just to prepare her. Wants to have this brought up in counseling sessions.

## 2018-10-03 ENCOUNTER — Other Ambulatory Visit: Payer: Self-pay | Admitting: Psychiatry

## 2018-10-04 ENCOUNTER — Telehealth: Payer: Self-pay | Admitting: Psychiatry

## 2018-10-04 ENCOUNTER — Other Ambulatory Visit: Payer: Self-pay

## 2018-10-04 MED ORDER — QUETIAPINE FUMARATE 100 MG PO TABS: 200.0000 mg | ORAL_TABLET | Freq: Every day | ORAL | 1 refills | Status: DC

## 2018-10-04 NOTE — Telephone Encounter (Signed)
Rebecca Novak called to request refills of her equetro and seroquel.  Next appt 10/21/18.  Please send to [facility].

## 2018-10-04 NOTE — Telephone Encounter (Signed)
equetro submitted this morning per request from [facility], but will send seroquel as well for 90 day supply

## 2018-10-15 ENCOUNTER — Other Ambulatory Visit: Payer: Self-pay | Admitting: Family Medicine

## 2018-10-20 ENCOUNTER — Other Ambulatory Visit: Payer: Self-pay | Admitting: *Deleted

## 2018-10-20 MED ORDER — SIMVASTATIN 40 MG PO TABS: 40.0000 mg | ORAL_TABLET | Freq: Every day | ORAL | 0 refills | Status: DC

## 2018-10-20 NOTE — Telephone Encounter (Signed)
Rx done. 

## 2018-10-21 ENCOUNTER — Ambulatory Visit: Payer: Medicare Other | Admitting: Psychiatry

## 2018-10-26 ENCOUNTER — Other Ambulatory Visit: Payer: Self-pay | Admitting: Psychiatry

## 2018-11-16 ENCOUNTER — Other Ambulatory Visit: Payer: Self-pay

## 2018-11-16 ENCOUNTER — Encounter: Payer: Self-pay | Admitting: Psychiatry

## 2018-11-16 ENCOUNTER — Ambulatory Visit (INDEPENDENT_AMBULATORY_CARE_PROVIDER_SITE_OTHER): Payer: Medicare Other | Admitting: Psychiatry

## 2018-11-16 DIAGNOSIS — F3132 Bipolar disorder, current episode depressed, moderate: Secondary | ICD-10-CM | POA: Diagnosis not present

## 2018-11-16 DIAGNOSIS — F5104 Psychophysiologic insomnia: Secondary | ICD-10-CM

## 2018-11-16 DIAGNOSIS — G4721 Circadian rhythm sleep disorder, delayed sleep phase type: Secondary | ICD-10-CM | POA: Diagnosis not present

## 2018-11-16 NOTE — Progress Notes (Signed)
OLEVIA WESTERVELT [id number] 11/09/1945 73 y.o.  Subjective:   Patient ID:  Rebecca Novak is a 73 y.o. (DOB 15-Sep-1945) female.  Chief Complaint:  Chief Complaint  Patient presents with  . Anxiety    med management  . Depression  . Stress  . Sleeping Problem   Altered Mental Status   . Depression  . Anxiety  . Memory Loss  . Medication Problem  . Stress  . Sleeping Problem     Anxiety Symptoms include decreased concentration. Patient reports no confusion, nervous/anxious behavior or suicidal ideas.    Depression        Associated symptoms include decreased concentration.  Associated symptoms include no fatigue, no appetite change and no suicidal ideas.  Past medical history includes anxiety.    [name] presents to the office today for follow-up of above and bipolar disorder.    Last visit March 2020. No med changes.  [name] chronic pain with spinal stenosis and it's hard to bear.  She's been otherwise alright.  Decided she doesn't want to move after all.  Disc this concern.  Stomach all churned up over it.  Stress with Covid.  CO poor sleep, initial insomnia.  Last night still awake at 4 AM she gets up and then naps.  Claims she's taking meds appropriately using a pill box.   Stress of [name] saying he won't be here much longer.  Getting weaker.  Uses walker.  Still drinks a lot and she has to make the drinks for him.  He gets short with her.  Pt denies recent episodes of confusion since here. Better eating with TV dinners. Has regained to 188#.  Can't make herself diet or exercise.  Disc this is complication for the meds.  Denies mood swings since the reduciton of CBZ.  Today Patient reports stable mood and denies depressed or irritable moods.  Patient denies any recent difficulty with anxiety except about where to move when [name] passes.  Patient denies difficulty with sleep maintenance, but compains of difficulty falling asleep. Denies appetite disturbance.   Patient reports that energy and motivation have been good.  Patient denies any difficulty with concentration.  Patient denies any suicidal ideation.  Denies confusion but aware of being forgetful.  Denies forgetting meds bc it's routine and using pill box.[name] no longer able to cook and he did this for them before and [name], sister, says pt eating poorly as a result.  Past Psychiatric Medication Trials: Equetro 1200, buspirone, Deplin, Latuda 120, duloxetine, Wellbutrin, Lexapro, sertraline, lithium no response, fluoxetine, Abilify 20, olanzapine, Geodon 160, gabapentin, lamotrigine 100, Depakote, trazodone, Ambien, clonazepam, temazepam, topiramate  Multiple problems. 2 recent hospitalizations (last 02/23/18) DT confusion and with high CBZ levels, the last was 13.9, but she was also dehydrated.  Review of Systems:  Review of Systems  Constitutional: Negative for activity change, appetite change, fatigue and unexpected weight change.  Gastrointestinal: Negative.   Musculoskeletal: Positive for arthralgias.  Neurological: Negative for tremors and weakness.  Psychiatric/Behavioral: Positive for decreased concentration, depression and sleep disturbance. Negative for agitation, behavioral problems, confusion, dysphoric mood, hallucinations, self-injury and suicidal ideas. The patient is not nervous/anxious and is not hyperactive.   Not dizzy.   Medications: I have reviewed the patient's current medications.  I  Current Outpatient Medications  Medication Sig Dispense Refill  . EQUETRO 200 MG CP12 12 hr capsule TAKE 1 CAPSULE DAILY AND   TAKE 2 CAPSULES (400MG      TOTAL) AT  BEDTIME 270 capsule 0  . LATUDA 120 MG TABS TAKE 1 TABLET EVERY EVENINGWITH A MEAL 90 tablet 0  . levothyroxine (SYNTHROID, LEVOTHROID) 88 MCG tablet Take 1 tablet by mouth once daily 30 tablet 2  . QUEtiapine (SEROQUEL) 100 MG tablet Take 2 tablets (200 mg total) by mouth at bedtime. 180 tablet 1  . simvastatin  (ZOCOR) 40 MG tablet Take 1 tablet (40 mg total) by mouth at bedtime. 90 tablet 0  . metoprolol tartrate (LOPRESSOR) 25 MG tablet Take 1 tablet (25 mg total) by mouth daily as needed (palpitations). (Patient not taking: Reported on 08/04/2018) 90 tablet 3   No current facility-administered medications for this visit.     Medication Side Effects: None  Allergies: No Known Allergies  Past Medical History:  Diagnosis Date  . ANEMIA, MILD 05/17/2008  . COLONIC POLYPS, HX OF 05/17/2007  . Depression    bipolar  . GLUCOSE INTOLERANCE 11/23/2007  . HAND PAIN 07/31/2008  . HYPERLIPIDEMIA 02/05/2007  . HYPOTHYROIDISM 02/05/2007  . Skin cancer    "scraped off chest and RLE" (02/15/2013)  . SYSTOLIC MURMUR 01/60/1093    Family History  Problem Relation Age of Onset  . Dementia Mother   . Healthy Father        until death at age 35  . Mental illness Brother   . Healthy Sister     Social History   Socioeconomic History  . Marital status: Married    Spouse name: [name]  . Number of children: 0  . Years of education: Not on file  . Highest education level: Not on file  Occupational History  . 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 Financial resource strain: Not hard at all  . Food insecurity    Worry: Never true    Inability: Never true  . Transportation needs    Medical: No    Non-medical: No  Tobacco Use  . Smoking status: Former Smoker    Packs/day: 0.33    Years: 44.00    Pack years: 14.52    Types: Cigarettes  . Smokeless tobacco: Never Used  . Tobacco comment: 02/15/2013 "stopped smoking cigarettes ~ 7 yr ago"  Substance and Sexual Activity  . Alcohol use: No  . Drug use: No  . Sexual activity: Yes  Lifestyle  . Physical activity    Days per week: Not on file    Minutes per session: Not on file  . Stress: Not on file  Relationships  . Social Herbalist on phone: Patient refused    Gets together: Patient  refused    Attends religious service: Patient refused    Active member of club or organization: Patient refused    Attends meetings of clubs or organizations: Patient refused    Relationship status: Patient refused  . Intimate partner violence    Fear of current or ex partner: No    Emotionally abused: No    Physically abused: No    Forced sexual activity: No  Other Topics Concern  . Not on file  Social History Narrative  . Not on file    Past Medical History, Surgical history, Social history, and Family history were reviewed and updated as appropriate.   [name] was in rehab awhile.  Back home.  Has terminal CA.  He's now dependent on her and she's always been dependent on him in the past.  She doesn't prepare meals.  Please  see review of systems for further details on the patient's review from today.   Objective:   Physical Exam:  There were no vitals taken for this visit.  Physical Exam Constitutional:      General: She is not in acute distress.    Appearance: She is well-developed.  Musculoskeletal:        General: No deformity.  Neurological:     Mental Status: She is alert and oriented to person, place, and time.     Motor: No tremor.     Coordination: Coordination normal.     Gait: Gait normal.  Psychiatric:        Attention and Perception: She is attentive. She does not perceive auditory hallucinations.        Mood and Affect: Mood is depressed. Mood is not anxious. Affect is not labile, blunt, angry or inappropriate.        Speech: Speech normal. Speech is not rapid and pressured, delayed or slurred.        Behavior: Behavior normal. Behavior is not agitated, aggressive, withdrawn or hyperactive.        Thought Content: Thought content normal. Thought content is not paranoid or delusional. Thought content does not include homicidal or suicidal ideation. Thought content does not include homicidal or suicidal plan.        Cognition and Memory: Cognition is impaired.  Memory is impaired.        Judgment: Judgment normal.     Comments: Fair to poor insight and fair judgment. Still easily confused.     MMSE 27/30, recall 1/3, reduced attention on 03/26/18  Lab Review:     Component Value Date/Time   NA 145 02/24/2018 0547   K 3.5 02/24/2018 0547   CL 111 02/24/2018 0547   CO2 26 02/24/2018 0547   GLUCOSE 101 (H) 02/24/2018 0547   GLUCOSE 86 05/05/2006 0904   BUN 18 02/24/2018 0547   CREATININE 0.88 02/24/2018 0547   CREATININE 0.96 (H) 06/26/2017 1610   CALCIUM 8.6 (L) 02/24/2018 0547   PROT 5.8 (L) 02/24/2018 0547   ALBUMIN 3.3 (L) 02/24/2018 0547   AST 21 02/24/2018 0547   ALT 10 02/24/2018 0547   ALKPHOS 62 02/24/2018 0547   BILITOT 0.8 02/24/2018 0547   GFRNONAA >60 02/24/2018 0547   GFRAA >60 02/24/2018 0547       Component Value Date/Time   WBC 6.0 02/24/2018 0547   RBC 3.70 (L) 02/24/2018 0547   RBC 3.70 (L) 02/24/2018 0547   HGB 10.8 (L) 02/24/2018 0547   HCT 33.4 (L) 02/24/2018 0547   HCT 34.9 01/18/2018 0549   PLT 232 02/24/2018 0547   MCV 90.3 02/24/2018 0547   MCH 29.2 02/24/2018 0547   MCHC 32.3 02/24/2018 0547   RDW 14.4 02/24/2018 0547   LYMPHSABS 1.7 02/24/2018 0547   MONOABS 0.7 02/24/2018 0547   EOSABS 0.1 02/24/2018 0547   BASOSABS 0.0 02/24/2018 0547    No results found for: POCLITH, LITHIUM   Lab Results  Component Value Date   CBMZ 3.9 (L) 02/25/2018    Never got labs requested in November and reminded at each appt and never got them. .res Assessment: Plan:    Senora was seen today for anxiety, depression, stress and sleeping problem.  Diagnoses and all orders for this visit:  Bipolar 1 disorder, depressed, moderate ([place])  Chronic insomnia  Delayed sleep phase syndrome     Please see After Visit Summary for patient specific instructions.   Future  Appointments  Date Time Provider [place]  12/08/2018  1:00 PM Caren Macadam, MD [id number]    No orders of the defined  types were placed in this encounter.     Greater than 50% of face to face time with patient was spent on counseling and coordination of care. We discussed recent 2 hospitalizations for delirium and weakness and dehydration.  Noncompliant with recent requests to get labs.  Still forgetful.  Overall her affect is more appropriate to the circumstances today.  And her mood is reasonably stable.  She is not manic.  She is physically stronger than she was when last seen.  She CO insomnia and meds not working for initial insomnia but does not seem to understand how she makes things worse by sleeping late into the morning.  Tried again to explain this to her.   No sleep meds will fix this.    She did not bring her medications today as requested.  Bring them every time.   She has not been taking Equetro correctly but is not manic.  I will not change the dose but would like to  check a level.  But she hasn't complied.   But she does have significant short-term memory problems.  She cannot remember what she is taking.  She also fails to read the bottle and take as is prescribed.  Bring meds to all appointment.  CBZ levels have been fluctuating wildly for unclear reasons. Will try to keep the meds at the lowest possible levels as risk of mood swings appears less than risk of med toxicity based on recent hospitalizations.  Not having mood swings.  Mild depression.  She's still not taking Latuda with food  but at bedtime.  Take Latuda with evening meal.  No other  Med changes today.   If she takes the [place] with a meal it may lessen the need for higher blood levels of Equetro.  Has regained lost weight.   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  Work on coping strategies for memory problems.  Her weight weight has varied widely over the past year from 167 up to 200 pounds.  It was 167 pounds in November and is 185 pounds today.  We will make no med changes this visit  FU 30 mins in 3  mos.  This was appt was 30 mins.  Lynder Parents, Md, DFAPA      -------------------------------

## 2018-11-24 ENCOUNTER — Encounter: Payer: Self-pay | Admitting: *Deleted

## 2018-12-08 ENCOUNTER — Telehealth: Payer: Self-pay | Admitting: *Deleted

## 2018-12-08 ENCOUNTER — Other Ambulatory Visit: Payer: Self-pay

## 2018-12-08 ENCOUNTER — Encounter: Payer: Self-pay | Admitting: Family Medicine

## 2018-12-08 ENCOUNTER — Ambulatory Visit (INDEPENDENT_AMBULATORY_CARE_PROVIDER_SITE_OTHER): Payer: Medicare Other | Admitting: Family Medicine

## 2018-12-08 DIAGNOSIS — E538 Deficiency of other specified B group vitamins: Secondary | ICD-10-CM

## 2018-12-08 DIAGNOSIS — D649 Anemia, unspecified: Secondary | ICD-10-CM

## 2018-12-08 DIAGNOSIS — F319 Bipolar disorder, unspecified: Secondary | ICD-10-CM

## 2018-12-08 DIAGNOSIS — E039 Hypothyroidism, unspecified: Secondary | ICD-10-CM | POA: Diagnosis not present

## 2018-12-08 DIAGNOSIS — Z85828 Personal history of other malignant neoplasm of skin: Secondary | ICD-10-CM | POA: Diagnosis not present

## 2018-12-08 DIAGNOSIS — Z1211 Encounter for screening for malignant neoplasm of colon: Secondary | ICD-10-CM

## 2018-12-08 DIAGNOSIS — D500 Iron deficiency anemia secondary to blood loss (chronic): Secondary | ICD-10-CM

## 2018-12-08 DIAGNOSIS — E785 Hyperlipidemia, unspecified: Secondary | ICD-10-CM

## 2018-12-08 DIAGNOSIS — Z1231 Encounter for screening mammogram for malignant neoplasm of breast: Secondary | ICD-10-CM | POA: Diagnosis not present

## 2018-12-08 NOTE — Telephone Encounter (Signed)
-----   Message from Caren Macadam, MD sent at 12/08/2018  1:31 PM EDT ----- Please schedule lab appointment for her; also note that I ordered cologuard for her

## 2018-12-08 NOTE — Telephone Encounter (Signed)
I called the pt, scheduled the lab appt as below and the pt is aware the Cologuard order was placed.

## 2018-12-08 NOTE — Progress Notes (Signed)
Virtual Visit via Video Note  I connected with Rebecca Novak   on 12/08/18 at  1:00 PM EDT by a video enabled telemedicine application and verified that I am speaking with the correct person using two identifiers.  Location patient: home Location provider:work office Persons participating in the virtual visit: patient, provider  I discussed the limitations of evaluation and management by telemedicine and the availability of in person appointments. The patient expressed understanding and agreed to proceed.   Rebecca Novak DOB: Mar 17, 1946 Encounter date: 12/08/2018  This is a 73 y.o. female who presents to establish care. Chief Complaint  Patient presents with  . Establish Care    History of present illness: Feeling well. Not having any problems. Staying healthy and feels that mood is good.   Hypothyroid:taking the synthroid 66mcg. Doing well with this.   KD:TOIZT on the zocor 40mg . No problems with this.   Bipolar disorder: follows with Dr. Clovis Pu. Last visit was 10/2018. She has had issues with med noncompliance/confusion on taking medications. He is keeping doses on lower end due to issues with toxicity of meds. Also makes mention of difficulty with short term memory. She is overdue for bloodwork and did not complete when previously ordered.  Anemia: hx of blood loss anemia.   mammogram: hasn't completed in years.  Colonoscopy 04/2015 was incomplete; supposed to repeat.  Stopped seeing Dr. [place] for derm; just hasn't followed up in awhile. Hx of skin cancers.   Has seen gynecology, but not for a few years.    Past Medical History:  Diagnosis Date  . ANEMIA, MILD 05/17/2008  . BCC (basal cell carcinoma) 08/08/2009   right cheek (CX35FU +exc. )  . BCC (basal cell carcinoma) 03/12/2011   right sideburn   . BCC (basal cell carcinoma) 12/08/2012   right lower leg (CX35FU)  . BCC (basal cell carcinoma) 04/25/2015   left lower back  . BCC (basal cell carcinoma)  05/27/2016   left shoulder (CX35FU), left cheek (CX35FU)  . COLONIC POLYPS, HX OF 05/17/2007  . Depression    bipolar  . GLUCOSE INTOLERANCE 11/23/2007  . HAND PAIN 07/31/2008  . HYPERLIPIDEMIA 02/05/2007  . HYPOTHYROIDISM 02/05/2007  . 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 SCC (squamous cell carcinoma) 11/18/2007   right lower leg (CX35FU)  . SCC (squamous cell carcinoma) 03/12/2011   left outer calf  . SCC (squamous cell carcinoma) 11/19/2011   left side of chest -tx p bx  . SCC (squamous cell carcinoma) 12/08/2012   left chest (CX35FU)  . SCC (squamous cell carcinoma) 09/28/2014   left arm inferior (CX35FU), right arm, lateral (CX35FU)  . SCC (squamous cell carcinoma) 04/25/2015   rigth forearm   . SCC (squamous cell carcinoma) 01/10/2017   right shin proximal (CX35FU)  . SCC (squamous cell carcinoma) 04/21/2018   top right hand-tx p bx  . SYSTOLIC MURMUR 24/58/0998   Past Surgical History:  Procedure Laterality Date  . APPENDECTOMY    . TONSILLECTOMY    . VAGINAL HYSTERECTOMY     uncertain reason.    No Known Allergies Current Meds  Medication Sig  . EQUETRO 200 MG CP12 12 hr capsule TAKE 1 CAPSULE DAILY AND   TAKE 2 CAPSULES (400MG      TOTAL) AT BEDTIME  . LATUDA 120 MG TABS TAKE 1 TABLET EVERY EVENINGWITH A MEAL  . levothyroxine (SYNTHROID, LEVOTHROID) 88 MCG tablet Take 1 tablet by mouth once daily  . metoprolol  tartrate (LOPRESSOR) 25 MG tablet Take 1 tablet (25 mg total) by mouth daily as needed (palpitations).  . QUEtiapine (SEROQUEL) 100 MG tablet Take 2 tablets (200 mg total) by mouth at bedtime.  . simvastatin (ZOCOR) 40 MG tablet Take 1 tablet (40 mg total) by mouth at bedtime.   Social History   Tobacco Use  . Smoking status: Former Smoker    Packs/day: 0.33    Years: 44.00    Pack years: 14.52    Types: Cigarettes  . Smokeless tobacco: Never Used  . Tobacco comment: 02/15/2013 "stopped smoking cigarettes ~ 7 yr ago"   Substance Use Topics  . Alcohol use: No   Family History  Problem Relation Age of Onset  . Dementia Mother   . Healthy Father        until death at age 49  . Mental illness Brother        not been in contact in years  . Healthy Sister      Review of Systems  Constitutional: Negative for chills, fatigue and fever.  Respiratory: Negative for cough, chest tightness, shortness of breath and wheezing.   Cardiovascular: Negative for chest pain, palpitations and leg swelling.    Objective:  There were no vitals taken for this visit.      BP Readings from Last 3 Encounters:  04/05/18 (!) 100/50  03/04/18 118/62  02/25/18 119/67   Wt Readings from Last 3 Encounters:  08/04/18 185 lb (83.9 kg)  04/05/18 167 lb 9.6 oz (76 kg)  03/04/18 167 lb 3.2 oz (75.8 kg)    EXAM:  Video component not working for her; visit was completed via telephone.  GENERAL: alert, oriented, appears well and in no acute distress  PSYCH/NEURO: pleasant and cooperative, no obvious depression or anxiety, speech and thought processing grossly intact. Poor recall of medication details, health history details.   Assessment/Plan  1. History of skin cancer Recommended following up with dermatology regularly due to history of multiple skin cancers. - Ambulatory referral to Dermatology; Future  2. Acquired hypothyroidism On Synthroid 88 mcg daily. - TSH; Future  3. Dyslipidemia 40 mg daily. - Comprehensive metabolic panel; Future - Lipid panel; Future  4. Iron deficiency anemia due to chronic blood loss We will recheck blood counts for stability. - CBC with Differential/Platelet; Future - IBC + Ferritin; Future  5. Bipolar affective disorder, remission status unspecified ([place]) Following with psychiatry regularly.  Documents difficulty maintaining therapeutic medication levels in light of some confusion with taking medications as directed  carbamazepine level, total; Future  6. Screening for  colon cancer Previous colonoscopy was incomplete.  I do think that Cologuard would be easier for her, so we have ordered this today. - Cologuard; Future  7. Anemia, unspecified type - Vitamin B12; Future  8. B12 deficiency - Vitamin B12; Future  9. Visit for screening mammogram Encouraged her to keep up with regular mammograms.  She has agreed to schedule. - MM DIGITAL SCREENING BILATERAL; Future   Return bloodwork; then [place] pending results.   I discussed the assessment and treatment plan with the patient. The patient was provided an opportunity to ask questions and all were answered. The patient agreed with the plan and demonstrated an understanding of the instructions.   The patient was advised to call back or seek an in-person evaluation if the symptoms worsen or if the condition fails to improve as anticipated.  I provided 32 minutes of non-face-to-face time during this encounter.   Micheline Rough,  MD

## 2018-12-15 ENCOUNTER — Other Ambulatory Visit: Payer: Self-pay

## 2018-12-15 ENCOUNTER — Other Ambulatory Visit (INDEPENDENT_AMBULATORY_CARE_PROVIDER_SITE_OTHER): Payer: Medicare Other

## 2018-12-15 DIAGNOSIS — D649 Anemia, unspecified: Secondary | ICD-10-CM

## 2018-12-15 DIAGNOSIS — F319 Bipolar disorder, unspecified: Secondary | ICD-10-CM

## 2018-12-15 DIAGNOSIS — E538 Deficiency of other specified B group vitamins: Secondary | ICD-10-CM

## 2018-12-15 DIAGNOSIS — W19XXXA Unspecified fall, initial encounter: Secondary | ICD-10-CM

## 2018-12-15 DIAGNOSIS — D500 Iron deficiency anemia secondary to blood loss (chronic): Secondary | ICD-10-CM

## 2018-12-15 DIAGNOSIS — E039 Hypothyroidism, unspecified: Secondary | ICD-10-CM | POA: Diagnosis not present

## 2018-12-15 DIAGNOSIS — E785 Hyperlipidemia, unspecified: Secondary | ICD-10-CM | POA: Diagnosis not present

## 2018-12-15 LAB — TSH: TSH: 10.24 u[IU]/mL — ABNORMAL HIGH (ref 0.35–4.50)

## 2018-12-15 LAB — COMPREHENSIVE METABOLIC PANEL
ALT: 15 U/L (ref 0–35)
AST: 23 U/L (ref 0–37)
Albumin: 3.9 g/dL (ref 3.5–5.2)
Alkaline Phosphatase: 90 U/L (ref 39–117)
BUN: 24 mg/dL — ABNORMAL HIGH (ref 6–23)
CO2: 26 mEq/L (ref 19–32)
Calcium: 8.7 mg/dL (ref 8.4–10.5)
Chloride: 108 mEq/L (ref 96–112)
Creatinine, Ser: 1.04 mg/dL (ref 0.40–1.20)
GFR: 51.65 mL/min — ABNORMAL LOW (ref >60.00)
Glucose, Bld: 105 mg/dL — ABNORMAL HIGH (ref 70–99)
Potassium: 4.4 mEq/L (ref 3.5–5.1)
Sodium: 141 mEq/L (ref 135–145)
Total Bilirubin: 0.2 mg/dL (ref 0.2–1.2)
Total Protein: 6.1 g/dL (ref 6.0–8.3)

## 2018-12-15 LAB — CBC WITH DIFFERENTIAL/PLATELET
Basophils Absolute: 0 10*3/uL (ref 0.0–0.1)
Basophils Relative: 0.6 % (ref 0.0–3.0)
Eosinophils Absolute: 0.2 10*3/uL (ref 0.0–0.7)
Eosinophils Relative: 4.6 % (ref 0.0–5.0)
HCT: 34.2 % — ABNORMAL LOW (ref 36.0–46.0)
Hemoglobin: 11.2 g/dL — ABNORMAL LOW (ref 12.0–15.0)
Lymphocytes Relative: 33.6 % (ref 12.0–46.0)
Lymphs Abs: 1.7 10*3/uL (ref 0.7–4.0)
MCHC: 32.7 g/dL (ref 30.0–36.0)
MCV: 81.4 fl (ref 78.0–100.0)
Monocytes Absolute: 0.5 10*3/uL (ref 0.1–1.0)
Monocytes Relative: 9.8 % (ref 3.0–12.0)
Neutro Abs: 2.5 10*3/uL (ref 1.4–7.7)
Neutrophils Relative %: 51.4 % (ref 43.0–77.0)
Platelets: 240 10*3/uL (ref 150.0–400.0)
RBC: 4.2 Mil/uL (ref 3.87–5.11)
RDW: 15.8 % — ABNORMAL HIGH (ref 11.5–15.5)
WBC: 5 10*3/uL (ref 4.0–10.5)

## 2018-12-15 LAB — IBC + FERRITIN
Ferritin: 6.9 ng/mL — ABNORMAL LOW (ref 10.0–291.0)
Iron: 33 ug/dL — ABNORMAL LOW (ref 42–145)
Saturation Ratios: 6.9 % — ABNORMAL LOW (ref 20.0–50.0)
Transferrin: 341 mg/dL (ref 212.0–360.0)

## 2018-12-15 LAB — FOLATE: Folate: 9 ng/mL (ref >5.9)

## 2018-12-15 LAB — LIPID PANEL
Cholesterol: 211 mg/dL — ABNORMAL HIGH (ref 0–200)
HDL: 62.6 mg/dL (ref >39.00)
LDL Cholesterol: 123 mg/dL — ABNORMAL HIGH (ref 0–99)
NonHDL: 148.74
Total CHOL/HDL Ratio: 3
Triglycerides: 128 mg/dL (ref 0.0–149.0)
VLDL: 25.6 mg/dL (ref 0.0–40.0)

## 2018-12-15 LAB — VITAMIN B12: Vitamin B-12: 223 pg/mL (ref 211–911)

## 2018-12-16 NOTE — Telephone Encounter (Signed)
Patient had labs yesterday wanted to make sure not changes in needed in script before called in refill?

## 2018-12-17 LAB — CARBAMAZEPINE LEVEL, TOTAL: Carbamazepine Lvl: 6.1 mg/L (ref 4.0–12.0)

## 2018-12-17 NOTE — Telephone Encounter (Signed)
Labs are stable so I have sent in refill zocor at same dose. I will send lab note later when I have a chance to review in more detail.

## 2018-12-23 ENCOUNTER — Other Ambulatory Visit: Payer: Self-pay | Admitting: Family Medicine

## 2018-12-23 ENCOUNTER — Telehealth: Payer: Self-pay | Admitting: *Deleted

## 2018-12-23 DIAGNOSIS — E039 Hypothyroidism, unspecified: Secondary | ICD-10-CM

## 2018-12-23 MED ORDER — LEVOTHYROXINE SODIUM 100 MCG PO TABS: 100.0000 ug | ORAL_TABLET | Freq: Every day | ORAL | 1 refills | Status: DC

## 2018-12-23 NOTE — Telephone Encounter (Signed)
Copied from [place] [id number]. Topic: General - Inquiry >> Dec 23, 2018  3:41 PM Richardo Priest, NT wrote: Reason for CRM: Patient called in wanting to speak with Pricilla Holm in regards to thyroid. Please advise and call back is [phone].

## 2018-12-23 NOTE — Telephone Encounter (Signed)
See results note. 

## 2018-12-24 ENCOUNTER — Telehealth: Payer: Self-pay | Admitting: *Deleted

## 2018-12-24 NOTE — Telephone Encounter (Signed)
I called the pt and reviewed the supplements recommended by Dr Ethlyn Gallery per labs done on 7/22.

## 2018-12-24 NOTE — Telephone Encounter (Signed)
See results note. 

## 2018-12-24 NOTE — Telephone Encounter (Signed)
Copied from [place] [id number]. Topic: General - Other >> Dec 24, 2018  2:14 PM Rayann Heman wrote: Reason for CRM: pt called and stated that she would like a call back from Pricilla Holm regarding OTC medication she picked up from [place]. Please advise

## 2019-01-04 ENCOUNTER — Telehealth: Payer: Self-pay | Admitting: Psychiatry

## 2019-01-04 NOTE — Telephone Encounter (Signed)
Called Rebecca Novak back but she was on the phone with Jettie at the time. Asked if I would call back in a little bit to follow up.

## 2019-01-04 NOTE — Telephone Encounter (Signed)
Patient's sister called saly and wants you to call her . She wants to talk to you about Linzie. So please give her a call back at [phone]

## 2019-01-09 ENCOUNTER — Other Ambulatory Visit: Payer: Self-pay | Admitting: Family Medicine

## 2019-01-13 ENCOUNTER — Other Ambulatory Visit: Payer: Self-pay | Admitting: Psychiatry

## 2019-01-20 ENCOUNTER — Other Ambulatory Visit: Payer: Self-pay | Admitting: Psychiatry

## 2019-02-04 ENCOUNTER — Other Ambulatory Visit: Payer: Medicare Other

## 2019-02-08 ENCOUNTER — Other Ambulatory Visit: Payer: Self-pay

## 2019-02-09 ENCOUNTER — Other Ambulatory Visit (INDEPENDENT_AMBULATORY_CARE_PROVIDER_SITE_OTHER): Payer: Medicare Other

## 2019-02-09 DIAGNOSIS — E039 Hypothyroidism, unspecified: Secondary | ICD-10-CM | POA: Diagnosis not present

## 2019-02-09 LAB — TSH: TSH: 3.99 u[IU]/mL (ref 0.35–4.50)

## 2019-02-11 DIAGNOSIS — M19011 Primary osteoarthritis, right shoulder: Secondary | ICD-10-CM | POA: Diagnosis not present

## 2019-02-11 DIAGNOSIS — M25511 Pain in right shoulder: Secondary | ICD-10-CM | POA: Diagnosis not present

## 2019-02-18 ENCOUNTER — Ambulatory Visit: Payer: Medicare Other | Admitting: Psychiatry

## 2019-03-07 DIAGNOSIS — D1809 Hemangioma of other sites: Secondary | ICD-10-CM | POA: Diagnosis not present

## 2019-03-07 DIAGNOSIS — C44529 Squamous cell carcinoma of skin of other part of trunk: Secondary | ICD-10-CM | POA: Diagnosis not present

## 2019-03-07 DIAGNOSIS — D485 Neoplasm of uncertain behavior of skin: Secondary | ICD-10-CM | POA: Diagnosis not present

## 2019-03-10 DIAGNOSIS — L918 Other hypertrophic disorders of the skin: Secondary | ICD-10-CM | POA: Diagnosis not present

## 2019-03-10 DIAGNOSIS — D1801 Hemangioma of skin and subcutaneous tissue: Secondary | ICD-10-CM | POA: Diagnosis not present

## 2019-03-15 ENCOUNTER — Other Ambulatory Visit: Payer: Self-pay

## 2019-03-15 ENCOUNTER — Ambulatory Visit (INDEPENDENT_AMBULATORY_CARE_PROVIDER_SITE_OTHER): Payer: Medicare Other | Admitting: Psychiatry

## 2019-03-15 ENCOUNTER — Encounter: Payer: Self-pay | Admitting: Psychiatry

## 2019-03-15 DIAGNOSIS — R7989 Other specified abnormal findings of blood chemistry: Secondary | ICD-10-CM

## 2019-03-15 DIAGNOSIS — F411 Generalized anxiety disorder: Secondary | ICD-10-CM

## 2019-03-15 DIAGNOSIS — G3184 Mild cognitive impairment, so stated: Secondary | ICD-10-CM

## 2019-03-15 DIAGNOSIS — F3132 Bipolar disorder, current episode depressed, moderate: Secondary | ICD-10-CM

## 2019-03-15 DIAGNOSIS — G4721 Circadian rhythm sleep disorder, delayed sleep phase type: Secondary | ICD-10-CM | POA: Diagnosis not present

## 2019-03-15 DIAGNOSIS — F5104 Psychophysiologic insomnia: Secondary | ICD-10-CM

## 2019-03-15 MED ORDER — CARBAMAZEPINE ER 200 MG PO CP12: 400.0000 mg | ORAL_CAPSULE | Freq: Every day | ORAL | 0 refills | Status: DC

## 2019-03-15 NOTE — Progress Notes (Signed)
SAMUS WERTH [id number] 03-Jul-1945 73 y.o.  Subjective:   Patient ID:  Rebecca Novak is a 73 y.o. (DOB 1945-06-21) female.  Chief Complaint:  Chief Complaint  Patient presents with  . Follow-up    Medication Management  . Depression    Medication Management   Altered Mental Status   . Depression  . Anxiety  . Memory Loss  . Medication Problem  . Stress  . Sleeping Problem     Anxiety Symptoms include decreased concentration. Patient reports no confusion, nervous/anxious behavior or suicidal ideas.    Depression        Associated symptoms include decreased concentration.  Associated symptoms include no fatigue, no appetite change and no suicidal ideas.  Past medical history includes anxiety.    Rebecca Novak presents to the office today for follow-up of above and bipolar disorder.    Last visit November 16 2018. No med changes.  She's done well without new complaints.  [name] holding on and hasn't complained.  Sits in chair all day.  He reads and watches news.  He doesn't complain much.    They don't go out much.  Claims she's taking meds appropriately using a pill box.   Sleep better than it has been with melatonin.  Stress of [name] saying he won't be here much longer.  Getting weaker.  Uses walker.  Still drinks a lot and she has to make the drinks for him.  He gets short with her.  Pt denies recent episodes of confusion since here. Better eating with TV dinners. .  Can't make herself diet or exercise.  Disc this is complication for the meds.  Denies mood swings since the reduciton of CBZ.  Today Patient reports stable mood and denies depressed or irritable moods.  Patient denies any recent difficulty with anxiety except about where to move when [name] passes.  Patient denies difficulty with sleep maintenance, but compains of difficulty falling asleep. Denies appetite disturbance.  Patient reports that energy and motivation have been good.  Patient denies any  difficulty with concentration.  Patient denies any suicidal ideation.  Denies confusion but aware of being forgetful.  Denies forgetting meds bc it's routine and using pill box.[name] no longer able to cook and he did this for them before and Rebecca Novak, sister, says pt eating poorly as a result.  [name] has a will.  [name] has made Rebecca Novak her POA and agrees for ROI for [name].  Pt feels she can manage the estate with direction.  2012 visited [place] with friends.  They are here and she's eating more.  Past Psychiatric Medication Trials: Equetro 1200, buspirone, Deplin, Latuda 120, duloxetine, Wellbutrin, Lexapro, sertraline, lithium no response, fluoxetine, Abilify 20, olanzapine, Geodon 160, gabapentin, lamotrigine 100, Depakote, trazodone, Ambien, clonazepam, temazepam, topiramate  Multiple problems. 2 recent hospitalizations (last 02/23/18) DT confusion and with high CBZ levels, the last was 13.9, but she was also dehydrated.  Review of Systems:  Review of Systems  Constitutional: Negative for activity change, appetite change, fatigue and unexpected weight change.  Gastrointestinal: Negative.   Musculoskeletal: Positive for arthralgias.  Neurological: Negative for tremors and weakness.  Psychiatric/Behavioral: Positive for decreased concentration, depression and sleep disturbance. Negative for agitation, behavioral problems, confusion, dysphoric mood, hallucinations, self-injury and suicidal ideas. The patient is not nervous/anxious and is not hyperactive.   Not dizzy.   Medications: I have reviewed the patient's current medications.  I  Current Outpatient Medications  Medication Sig Dispense Refill  .  EQUETRO 200 MG CP12 12 hr capsule TAKE 1 CAPSULE DAILY AND   TAKE 2 CAPSULES (400MG      TOTAL) AT BEDTIME 270 capsule 0  . LATUDA 120 MG TABS TAKE 1 TABLET EVERY EVENINGWITH A MEAL 90 tablet 0  . levothyroxine (SYNTHROID) 100 MCG tablet Take 1 tablet (100 mcg total) by mouth daily. 90  tablet 1  . metoprolol tartrate (LOPRESSOR) 25 MG tablet Take 1 tablet (25 mg total) by mouth daily as needed (palpitations). 90 tablet 3  . QUEtiapine (SEROQUEL) 100 MG tablet Take 2 tablets (200 mg total) by mouth at bedtime. 180 tablet 1  . simvastatin (ZOCOR) 40 MG tablet TAKE 1 TABLET BY MOUTH AT BEDTIME 90 tablet 0   No current facility-administered medications for this visit.     Medication Side Effects: None  Allergies: No Known Allergies  Past Medical History:  Diagnosis Date  . ANEMIA, MILD 05/17/2008  . BCC (basal cell carcinoma) 08/08/2009   right cheek (CX35FU +exc. )  . BCC (basal cell carcinoma) 03/12/2011   right sideburn   . BCC (basal cell carcinoma) 12/08/2012   right lower leg (CX35FU)  . BCC (basal cell carcinoma) 04/25/2015   left lower back  . BCC (basal cell carcinoma) 05/27/2016   left shoulder (CX35FU), left cheek (CX35FU)  . COLONIC POLYPS, HX OF 05/17/2007  . Depression    bipolar  . GLUCOSE INTOLERANCE 11/23/2007  . HAND PAIN 07/31/2008  . HYPERLIPIDEMIA 02/05/2007  . HYPOTHYROIDISM 02/05/2007  . 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 SCC (squamous cell carcinoma) 11/18/2007   right lower leg (CX35FU)  . SCC (squamous cell carcinoma) 03/12/2011   left outer calf  . SCC (squamous cell carcinoma) 11/19/2011   left side of chest -tx p bx  . SCC (squamous cell carcinoma) 12/08/2012   left chest (CX35FU)  . SCC (squamous cell carcinoma) 09/28/2014   left arm inferior (CX35FU), right arm, lateral (CX35FU)  . SCC (squamous cell carcinoma) 04/25/2015   rigth forearm   . SCC (squamous cell carcinoma) 01/10/2017   right shin proximal (CX35FU)  . SCC (squamous cell carcinoma) 04/21/2018   top right hand-tx p bx  . SYSTOLIC MURMUR [id number]    Family History  Problem Relation Age of Onset  . Dementia Mother   . Healthy Father        until death at age 55  . Mental illness Brother        not been in contact in  years  . Healthy Sister     Social History   Socioeconomic History  . Marital status: Married    Spouse name: [name]  . Number of children: 0  . Years of education: Not on file  . Highest education level: Not on file  Occupational History  . 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 Financial resource strain: Not hard at all  . Food insecurity    Worry: Never true    Inability: Never true  . Transportation needs    Medical: No    Non-medical: No  Tobacco Use  . Smoking status: Former Smoker    Packs/day: 0.33    Years: 44.00    Pack years: 14.52    Types: Cigarettes  . Smokeless tobacco: Never Used  . Tobacco comment: 02/15/2013 "stopped smoking cigarettes ~ 7 yr ago"  Substance and Sexual Activity  . Alcohol use: No  . Drug  use: No  . Sexual activity: Yes  Lifestyle  . Physical activity    Days per week: Not on file    Minutes per session: Not on file  . Stress: Not on file  Relationships  . Social Herbalist on phone: Patient refused    Gets together: Patient refused    Attends religious service: Patient refused    Active member of club or organization: Patient refused    Attends meetings of clubs or organizations: Patient refused    Relationship status: Patient refused  . Intimate partner violence    Fear of current or ex partner: No    Emotionally abused: No    Physically abused: No    Forced sexual activity: No  Other Topics Concern  . Not on file  Social History Narrative  . Not on file    Past Medical History, Surgical history, Social history, and Family history were reviewed and updated as appropriate.   [name] was in rehab awhile.  Back home.  Has terminal CA.  He's now dependent on her and she's always been dependent on him in the past.  She doesn't prepare meals.  Please see review of systems for further details on the patient's review from today.   Objective:   Physical Exam:  There were no  vitals taken for this visit.  Physical Exam Constitutional:      General: She is not in acute distress.    Appearance: She is well-developed.  Musculoskeletal:        General: No deformity.  Neurological:     Mental Status: She is alert and oriented to person, place, and time.     Motor: No tremor.     Coordination: Coordination normal.     Gait: Gait normal.  Psychiatric:        Attention and Perception: She is attentive. She does not perceive auditory hallucinations.        Mood and Affect: Mood is depressed. Mood is not anxious. Affect is not labile, blunt, angry or inappropriate.        Speech: Speech normal. Speech is not rapid and pressured, delayed or slurred.        Behavior: Behavior normal. Behavior is not agitated, aggressive, withdrawn or hyperactive.        Thought Content: Thought content normal. Thought content is not paranoid or delusional. Thought content does not include homicidal or suicidal ideation. Thought content does not include homicidal or suicidal plan.        Cognition and Memory: Cognition is impaired. Memory is impaired.        Judgment: Judgment normal.     Comments: Fair to poor insight and fair judgment. Still easily confused.     MMSE 27/30, recall 1/3, reduced attention on 03/26/18  Lab Review:     Component Value Date/Time   NA 141 12/15/2018 1152   K 4.4 12/15/2018 1152   CL 108 12/15/2018 1152   CO2 26 12/15/2018 1152   GLUCOSE 105 (H) 12/15/2018 1152   GLUCOSE 86 05/05/2006 0904   BUN 24 (H) 12/15/2018 1152   CREATININE 1.04 12/15/2018 1152   CREATININE 0.96 (H) 06/26/2017 1610   CALCIUM 8.7 12/15/2018 1152   PROT 6.1 12/15/2018 1152   ALBUMIN 3.9 12/15/2018 1152   AST 23 12/15/2018 1152   ALT 15 12/15/2018 1152   ALKPHOS 90 12/15/2018 1152   BILITOT 0.2 12/15/2018 1152   GFRNONAA >60 02/24/2018 0547   GFRAA >60 02/24/2018  0547       Component Value Date/Time   WBC 5.0 12/15/2018 1152   RBC 4.20 12/15/2018 1152   HGB 11.2 (L)  12/15/2018 1152   HCT 34.2 (L) 12/15/2018 1152   HCT 34.9 01/18/2018 0549   PLT 240.0 12/15/2018 1152   MCV 81.4 12/15/2018 1152   MCH 29.2 02/24/2018 0547   MCHC 32.7 12/15/2018 1152   RDW 15.8 (H) 12/15/2018 1152   LYMPHSABS 1.7 12/15/2018 1152   MONOABS 0.5 12/15/2018 1152   EOSABS 0.2 12/15/2018 1152   BASOSABS 0.0 12/15/2018 1152    No results found for: POCLITH, LITHIUM   Lab Results  Component Value Date   CBMZ 6.1 12/15/2018    Never got labs requested in November and reminded at each appt and never got them. .res Assessment: Plan:    Ariyannah was seen today for follow-up and depression.  Diagnoses and all orders for this visit:  Bipolar 1 disorder, depressed, moderate (HCC)  Chronic insomnia  Delayed sleep phase syndrome  Generalized anxiety disorder  Mild cognitive impairment  Low vitamin D level  Greater than 50% of 30-minute face to face time with patient was spent on counseling and coordination of care. We discussed Extensive discussion about her bipolar disorder which is under relatively good control at the moment and unusual given the degree of stress that she is experiencing.  She has had previous bouts of delirium with poor self-care and dehydration but she has done a better job of managing this since the last appointment.  She claims she is compliant with medications except as noted.  She is not having adverse side effects with the medication.  Continue current meds without change except since she is only taking Equetro at night we will continue with the current dosage because she is doing acceptably well and not manic.  Discussed potential metabolic side effects associated with atypical antipsychotics, as well as potential risk for movement side effects. Advised pt to contact office if movement side effects occur.   Discussed importance of her self-care and compliance with medication for her overall mental stability.  Discussed requested release of  information by her Eugenia Mcalpine.  Patient agrees to this consent as needed for her self-care.  Patient claims that she will be able to manage legal and other affairs after can passes away.  That is expected within a short period of time given his cancer diagnosis and progression.  Supportive therapy dealing with the severity of the stress associated with cans cancer.  Emphasized adequate diet and fluid intake to prevent further episodes of delirium and orthostatic hypotension.  FU 3 mos  Lynder Parents, MD, DFAPA    Please see After Visit Summary for patient specific instructions.   Future Appointments  Date Time Provider [place]  03/23/2019 10:15 AM [facility]    No orders of the defined types were placed in this encounter.     Greater than 50% of face to face time with patient was spent on counseling and coordination of care. We discussed recent 2 hospitalizations for delirium and weakness and dehydration.  Noncompliant with recent requests to get labs.  Still forgetful.  Overall her affect is more appropriate to the circumstances today.  And her mood is reasonably stable.  She is not manic.  She is physically stronger than she was when last seen.  She CO insomnia and meds not working for initial insomnia but does not seem to understand how she makes things worse by  sleeping late into the morning.  Tried again to explain this to her.   No sleep meds will fix this.    She did not bring her medications today as requested.  Bring them every time.  Not taking Equetro in the morning and doing OK without it so no change.  In other words reduce scheduled Equetro 400 mg HS>   But she does have significant short-term memory problems.  She cannot remember what she is taking.  She also fails to read the bottle and take as is prescribed.  Bring meds to all appointment.  Not having mood swings.  Mild depression.  She's still not taking Latuda with food  but at bedtime.   Take Latuda with evening meal.  No other  Med changes today.   If she takes the [place] with a meal it may lessen the need for higher blood levels of Equetro.  Has regained lost weight.   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  Work on coping strategies for memory problems.  Her weight weight has varied widely over the past year from 167 up to 200 pounds.  It was 167 pounds in November and is 185 pounds today.  We will make no med changes this visit  FU 30 mins in 3 mos.  This was appt was 30 mins.  Lynder Parents, Md, DFAPA      -------------------------------

## 2019-03-16 ENCOUNTER — Ambulatory Visit: Payer: Medicare Other | Admitting: Psychiatry

## 2019-03-18 ENCOUNTER — Telehealth: Payer: Self-pay

## 2019-03-18 NOTE — Telephone Encounter (Signed)
I discussed Sally's concerns with Ishani very specifically.  Clorissa insists that she will be able to handle arrangements as long as she has direction from others as to exactly what to do.  She does not know how to handle his state matters.  She is likely to need legal help to accomplish that.  I do not think it would be productive for me to have a discussion with [name] without Lillyanne present.  [name] wants me to help in some way then [name] and Aylinn need to come to an appointment together.  This all needs to be out in the open with both of them involved in the conversation if I am to be of help.  Please give [name] this information.

## 2019-03-18 NOTE — Telephone Encounter (Signed)
[name], sister called to check how pt's last appt went, did Dr. Clovis Pu discuss Rebecca Novak's living arrangements after [name] passes away? [name] doesn't think she realizes the impact it will have on her and to try and live alone away from her sister, pt wants to stay in [place]. Pt refuses to look at places to move to and [name] assumes she will have to come live with her until they can find housing for her locally. [name] says [name] is not capable of doing what she thinks she can, [name] has to prompt her and getting her moving on everything. She's just concerned that [name] is not thinking ahead.

## 2019-03-18 NOTE — Telephone Encounter (Signed)
Opened in error

## 2019-03-18 NOTE — Telephone Encounter (Signed)
error 

## 2019-03-18 NOTE — Telephone Encounter (Signed)
Left [name] a VM to return my call.

## 2019-03-23 ENCOUNTER — Ambulatory Visit (INDEPENDENT_AMBULATORY_CARE_PROVIDER_SITE_OTHER): Payer: Medicare Other | Admitting: *Deleted

## 2019-03-23 ENCOUNTER — Encounter: Payer: Self-pay | Admitting: Family Medicine

## 2019-03-23 ENCOUNTER — Other Ambulatory Visit: Payer: Self-pay

## 2019-03-23 DIAGNOSIS — Z23 Encounter for immunization: Secondary | ICD-10-CM | POA: Diagnosis not present

## 2019-03-31 ENCOUNTER — Other Ambulatory Visit: Payer: Self-pay

## 2019-03-31 MED ORDER — QUETIAPINE FUMARATE 100 MG PO TABS: 200.0000 mg | ORAL_TABLET | Freq: Every day | ORAL | 1 refills | Status: DC

## 2019-04-03 ENCOUNTER — Other Ambulatory Visit: Payer: Self-pay | Admitting: Psychiatry

## 2019-04-07 DIAGNOSIS — C44529 Squamous cell carcinoma of skin of other part of trunk: Secondary | ICD-10-CM | POA: Diagnosis not present

## 2019-04-12 ENCOUNTER — Other Ambulatory Visit: Payer: Self-pay | Admitting: Family Medicine

## 2019-04-14 ENCOUNTER — Other Ambulatory Visit: Payer: Self-pay | Admitting: Family Medicine

## 2019-04-19 NOTE — Telephone Encounter (Signed)
Closing out this message due to not being able to reach pt's sister. Will relay message if she returns our call.

## 2019-04-29 ENCOUNTER — Other Ambulatory Visit: Payer: Self-pay | Admitting: Psychiatry

## 2019-05-31 ENCOUNTER — Other Ambulatory Visit: Payer: Self-pay

## 2019-05-31 MED ORDER — LATUDA 120 MG PO TABS: ORAL_TABLET | ORAL | 0 refills | Status: DC

## 2019-06-16 ENCOUNTER — Ambulatory Visit: Payer: Medicare Other

## 2019-06-20 DIAGNOSIS — M1711 Unilateral primary osteoarthritis, right knee: Secondary | ICD-10-CM | POA: Diagnosis not present

## 2019-06-20 DIAGNOSIS — M25561 Pain in right knee: Secondary | ICD-10-CM | POA: Insufficient documentation

## 2019-06-21 ENCOUNTER — Other Ambulatory Visit: Payer: Self-pay | Admitting: Psychiatry

## 2019-06-28 ENCOUNTER — Ambulatory Visit: Payer: Federal, State, Local not specified - PPO | Admitting: Psychiatry

## 2019-07-07 ENCOUNTER — Ambulatory Visit: Payer: Medicare Other | Admitting: Psychiatry

## 2019-07-17 ENCOUNTER — Ambulatory Visit: Payer: Medicare Other | Attending: Internal Medicine

## 2019-07-17 DIAGNOSIS — Z23 Encounter for immunization: Secondary | ICD-10-CM | POA: Insufficient documentation

## 2019-07-17 NOTE — Progress Notes (Signed)
   Covid-19 Vaccination [facility]  Name:  Rebecca Novak    MRN: [id number] DOB: 1945/10/03  07/17/2019  [name] was observed post Covid-19 immunization for 15 minutes without incidence. She was provided with Vaccine Information Sheet and instruction to access the V-Safe system.   [name] was instructed to call 911 with any severe reactions post vaccine: Marland Kitchen Difficulty breathing  . Swelling of your face and throat  . A fast heartbeat  . A bad rash all over your body  . Dizziness and weakness    Immunizations Administered    Name Date Dose VIS Date Route   Pfizer COVID-19 Vaccine 07/17/2019 12:20 PM 0.3 mL 05/06/2019 Intramuscular   Manufacturer: [place]   Lot: [id number]   [place]: [id number]

## 2019-07-18 ENCOUNTER — Other Ambulatory Visit: Payer: Self-pay | Admitting: Psychiatry

## 2019-07-18 ENCOUNTER — Telehealth: Payer: Self-pay | Admitting: Psychiatry

## 2019-07-18 DIAGNOSIS — M1711 Unilateral primary osteoarthritis, right knee: Secondary | ICD-10-CM | POA: Diagnosis not present

## 2019-07-18 DIAGNOSIS — M7051 Other bursitis of knee, right knee: Secondary | ICD-10-CM | POA: Insufficient documentation

## 2019-07-18 NOTE — Telephone Encounter (Signed)
[name] called and said that malaiya has not been taking her clonazapam. She started back taking it again last night. [name] doesn't know if kathereine needs to have labs done. Please call sally back at [phone]

## 2019-07-18 NOTE — Telephone Encounter (Signed)
No labs necessary with clonazepam.  She has an appointment in about 3 weeks.

## 2019-07-19 ENCOUNTER — Telehealth: Payer: Self-pay | Admitting: Psychiatry

## 2019-07-19 NOTE — Telephone Encounter (Signed)
Pt called to report she has not been taking Carbamazepine  Cap. 200 mg at all for about 6 mos. She has over looked this med. Please advise if she needs to start this med and how to start. She has about 30 caps. Will need refill @ [facility] 90 days supply.

## 2019-07-20 NOTE — Telephone Encounter (Signed)
Verify that she is still taking Latuda.  If so then her mood stability has been adequately controlled with just the Latuda and she does not need to restart the carbamazepine at this time.  She has an appointment in about 3 weeks or so.

## 2019-07-20 NOTE — Telephone Encounter (Signed)
Spoke with patient this morning, she verified taking Latuda and her mood is okay currently. Advised her to call back if there are any changes so we can have her restart her Carbamazepine before her apt. She agreed.

## 2019-08-03 ENCOUNTER — Encounter: Payer: Self-pay | Admitting: Psychiatry

## 2019-08-03 ENCOUNTER — Other Ambulatory Visit: Payer: Self-pay

## 2019-08-03 ENCOUNTER — Ambulatory Visit (INDEPENDENT_AMBULATORY_CARE_PROVIDER_SITE_OTHER): Payer: Medicare Other | Admitting: Psychiatry

## 2019-08-03 DIAGNOSIS — R7989 Other specified abnormal findings of blood chemistry: Secondary | ICD-10-CM | POA: Diagnosis not present

## 2019-08-03 DIAGNOSIS — F411 Generalized anxiety disorder: Secondary | ICD-10-CM | POA: Diagnosis not present

## 2019-08-03 DIAGNOSIS — G3184 Mild cognitive impairment, so stated: Secondary | ICD-10-CM | POA: Diagnosis not present

## 2019-08-03 DIAGNOSIS — L570 Actinic keratosis: Secondary | ICD-10-CM | POA: Diagnosis not present

## 2019-08-03 DIAGNOSIS — F319 Bipolar disorder, unspecified: Secondary | ICD-10-CM

## 2019-08-03 DIAGNOSIS — G4721 Circadian rhythm sleep disorder, delayed sleep phase type: Secondary | ICD-10-CM | POA: Diagnosis not present

## 2019-08-03 DIAGNOSIS — F5104 Psychophysiologic insomnia: Secondary | ICD-10-CM

## 2019-08-03 DIAGNOSIS — D485 Neoplasm of uncertain behavior of skin: Secondary | ICD-10-CM | POA: Diagnosis not present

## 2019-08-03 NOTE — Progress Notes (Signed)
Rebecca Novak [id number] 08/22/1945 74 y.o.  Subjective:   Patient ID:  Rebecca Novak is a 74 y.o. (DOB 1945/08/06) female.  Chief Complaint:  Chief Complaint  Patient presents with  . Follow-up     Medication Management  . Other    Bipolar 1 disorder, depressed, moderate   . Medication Refill    Ambien   Altered Mental Status   . Depression  . Anxiety  . Memory Loss  . Medication Problem  . Stress  . Sleeping Problem     Depression        Associated symptoms include decreased concentration.  Associated symptoms include no fatigue, no appetite change and no suicidal ideas.  Past medical history includes anxiety.   Anxiety Symptoms include decreased concentration. Patient reports no confusion, nervous/anxious behavior or suicidal ideas.    Medication Refill Associated symptoms include arthralgias. Pertinent negatives include no fatigue or weakness.   Rebecca Novak presents to the office today for follow-up of above and bipolar disorder.    Last visit October 2020. No med changes.  Patient called July 19, 2019 reporting that she had inadvertently stopped carbamazepine apparently as much as 6 months ago.  Given that the patient was not notably worse off that medication it was not restarted.  There also had been some question since her last appointment about her consistency with the clonazepam as well.  Overall well.  B died in asst living a couple of weeks ago at 76 yo.  Not sure what happened.  Hadn't seen him in about 3 years.  Death was sudden.  Not missed the CBZ since off of it.  Sad over his death.   No unusual irritability nor anxiety.  Focus on [name].  He's been stable. She's done well without new complaints.  [name] holding on and hasn't complained at 75 yo.  Sits in chair all day.  He reads and watches news.  He doesn't complain much.    They don't go out much.  Claims she's taking meds appropriately using a pill box.   Sleep better than it has  been with melatonin. Sleep 9 hours.  Stress of [name] saying he won't be here much longer.  Getting weaker.  Uses walker.  Still drinks a lot and she has to make the drinks for him.  He gets short with her.  He doesn't do much.    Pt denies recent episodes of confusion since here. Better eating with TV dinners. .  Can't make herself diet or exercise.  Disc this is complication for the meds.  Denies mood swings since the stopping CBZ.  Today Patient reports stable mood and denies depressed or irritable moods.  Patient denies any recent difficulty with anxiety except about where to move when [name] passes.  Patient denies difficulty with sleep maintenance, but compains of difficulty falling asleep. Denies appetite disturbance.  Patient reports that energy and motivation have been good.  Patient denies any difficulty with concentration.  Patient denies any suicidal ideation.  Denies confusion but aware of being forgetful.  Denies forgetting meds bc it's routine and using pill box.[name] no longer able to cook and he did this for them before and [name], sister, says pt eating poorly as a result.  [name] has a will.  Zola has made [name] her POA and agrees for ROI for [name].  Pt feels she can manage the estate with direction.  2012 visited [place] with friends.  They are here and  she's eating more.  Past Psychiatric Medication Trials: Equetro 1200, buspirone, Deplin, Latuda 120, duloxetine, Wellbutrin, Lexapro, sertraline, lithium no response, fluoxetine, Abilify 20, olanzapine, Geodon 160, gabapentin, lamotrigine 100, Depakote, trazodone, Ambien, clonazepam, temazepam, topiramate  Multiple problems. 2 recent hospitalizations (last 02/23/18) DT confusion and with high CBZ levels, the last was 13.9, but she was also dehydrated.   Review of Systems:  Review of Systems  Constitutional: Negative for activity change, appetite change, fatigue and unexpected weight change.  Gastrointestinal: Negative.    Musculoskeletal: Positive for arthralgias.  Neurological: Negative for tremors and weakness.  Psychiatric/Behavioral: Positive for decreased concentration and depression. Negative for agitation, behavioral problems, confusion, dysphoric mood, hallucinations, self-injury, sleep disturbance and suicidal ideas. The patient is not nervous/anxious and is not hyperactive.   Not dizzy.   Medications: I have reviewed the patient's current medications.  I  Current Outpatient Medications  Medication Sig Dispense Refill  . diclofenac (VOLTAREN) 75 MG EC tablet Take 75 mg by mouth 2 (two) times daily as needed.    Marland Kitchen EQUETRO 200 MG CP12 12 hr capsule TAKE 1 CAPSULE DAILY AND   TAKE 2 CAPSULES (400MG      TOTAL) AT BEDTIME 270 capsule 0  . levothyroxine (SYNTHROID) 100 MCG tablet Take 1 tablet (100 mcg total) by mouth daily. 90 tablet 1  . Lurasidone HCl (LATUDA) 120 MG TABS TAKE 1 TABLET EVERY EVENINGWITH A MEAL 90 tablet 0  . meloxicam (MOBIC) 7.5 MG tablet meloxicam 7.5 mg tablet  take one tablet daily    . metoprolol tartrate (LOPRESSOR) 25 MG tablet Take 1 tablet (25 mg total) by mouth daily as needed (palpitations). 90 tablet 3  . QUEtiapine (SEROQUEL) 100 MG tablet Take 2 tablets (200 mg total) by mouth at bedtime. 180 tablet 1  . simvastatin (ZOCOR) 40 MG tablet TAKE 1 TABLET BY MOUTH AT BEDTIME 90 tablet 0  . zolpidem (AMBIEN) 10 MG tablet zolpidem 10 mg tablet     No current facility-administered medications for this visit.    Medication Side Effects: None  Allergies: No Known Allergies  Past Medical History:  Diagnosis Date  . ANEMIA, MILD 05/17/2008  . BCC (basal cell carcinoma) 08/08/2009   right cheek (CX35FU +exc. )  . BCC (basal cell carcinoma) 03/12/2011   right sideburn   . BCC (basal cell carcinoma) 12/08/2012   right lower leg (CX35FU)  . BCC (basal cell carcinoma) 04/25/2015   left lower back  . BCC (basal cell carcinoma) 05/27/2016   left shoulder (CX35FU), left cheek  (CX35FU)  . COLONIC POLYPS, HX OF 05/17/2007  . Depression    bipolar  . GLUCOSE INTOLERANCE 11/23/2007  . HAND PAIN 07/31/2008  . HYPERLIPIDEMIA 02/05/2007  . HYPOTHYROIDISM 02/05/2007  . 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 SCC (squamous cell carcinoma) 11/18/2007   right lower leg (CX35FU)  . SCC (squamous cell carcinoma) 03/12/2011   left outer calf  . SCC (squamous cell carcinoma) 11/19/2011   left side of chest -tx p bx  . SCC (squamous cell carcinoma) 12/08/2012   left chest (CX35FU)  . SCC (squamous cell carcinoma) 09/28/2014   left arm inferior (CX35FU), right arm, lateral (CX35FU)  . SCC (squamous cell carcinoma) 04/25/2015   rigth forearm   . SCC (squamous cell carcinoma) 01/10/2017   right shin proximal (CX35FU)  . SCC (squamous cell carcinoma) 04/21/2018   top right hand-tx p bx  . SYSTOLIC MURMUR [id number]    Family History  Problem Relation Age of Onset  . Dementia Mother   . Healthy Father        until death at age 71  . Mental illness Brother        not been in contact in years  . Healthy Sister     Social History   Socioeconomic History  . Marital status: Married    Spouse name: [name]  . Number of children: 0  . Years of education: Not on file  . Highest education level: Not on file  Occupational History  . 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 Smoking status: Former Smoker    Packs/day: 0.33    Years: 44.00    Pack years: 14.52    Types: Cigarettes  . Smokeless tobacco: Never Used  . Tobacco comment: 02/15/2013 "stopped smoking cigarettes ~ 7 yr ago"  Substance and Sexual Activity  . Alcohol use: No  . Drug use: No  . Sexual activity: Yes  Other Topics Concern  . Not on file  Social History Narrative  . Not on file   Social Determinants of Health   Financial Resource Strain:   . Difficulty of Paying Living Expenses: Not on file  Food Insecurity:   .  Worried About Charity fundraiser in the Last Year: Not on file  . Ran Out of Food in the Last Year: Not on file  Transportation Needs:   . Lack of Transportation (Medical): Not on file  . Lack of Transportation (Non-Medical): Not on file  Physical Activity:   . Days of Exercise per Week: Not on file  . Minutes of Exercise per Session: Not on file  Stress:   . Feeling of Stress : Not on file  Social Connections:   . Frequency of Communication with Friends and Family: Not on file  . Frequency of Social Gatherings with Friends and Family: Not on file  . Attends Religious Services: Not on file  . Active Member of Clubs or Organizations: Not on file  . Attends Archivist Meetings: Not on file  . Marital Status: Not on file  Intimate Partner Violence:   . Fear of Current or Ex-Partner: Not on file  . Emotionally Abused: Not on file  . Physically Abused: Not on file  . Sexually Abused: Not on file    Past Medical History, Surgical history, Social history, and Family history were reviewed and updated as appropriate.   [name] was in rehab awhile.  Back home.  Has terminal CA.  He's now dependent on her and she's always been dependent on him in the past.  She doesn't prepare meals.  Please see review of systems for further details on the patient's review from today.   Objective:   Physical Exam:  There were no vitals taken for this visit.  Physical Exam Constitutional:      General: She is not in acute distress.    Appearance: She is well-developed.  Musculoskeletal:        General: No deformity.  Neurological:     Mental Status: She is alert and oriented to person, place, and time.     Motor: No tremor.     Coordination: Coordination normal.     Gait: Gait normal.  Psychiatric:        Attention and Perception: She is attentive. She does not perceive auditory hallucinations.        Mood and Affect: Mood is not  anxious or depressed. Affect is not labile, blunt, angry or  inappropriate.        Speech: Speech normal. Speech is not rapid and pressured, delayed or slurred.        Behavior: Behavior normal. Behavior is not agitated, aggressive, withdrawn or hyperactive.        Thought Content: Thought content normal. Thought content is not paranoid or delusional. Thought content does not include homicidal or suicidal ideation. Thought content does not include homicidal or suicidal plan.        Cognition and Memory: Cognition is impaired. Memory is impaired.        Judgment: Judgment normal.     Comments: Fair to poor insight and fair judgment. Less easily confused.     MMSE 27/30, recall 1/3, reduced attention on 03/26/18  Lab Review:     Component Value Date/Time   NA 141 12/15/2018 1152   K 4.4 12/15/2018 1152   CL 108 12/15/2018 1152   CO2 26 12/15/2018 1152   GLUCOSE 105 (H) 12/15/2018 1152   GLUCOSE 86 05/05/2006 0904   BUN 24 (H) 12/15/2018 1152   CREATININE 1.04 12/15/2018 1152   CREATININE 0.96 (H) 06/26/2017 1610   CALCIUM 8.7 12/15/2018 1152   PROT 6.1 12/15/2018 1152   ALBUMIN 3.9 12/15/2018 1152   AST 23 12/15/2018 1152   ALT 15 12/15/2018 1152   ALKPHOS 90 12/15/2018 1152   BILITOT 0.2 12/15/2018 1152   GFRNONAA >60 02/24/2018 0547   GFRAA >60 02/24/2018 0547       Component Value Date/Time   WBC 5.0 12/15/2018 1152   RBC 4.20 12/15/2018 1152   HGB 11.2 (L) 12/15/2018 1152   HCT 34.2 (L) 12/15/2018 1152   HCT 34.9 01/18/2018 0549   PLT 240.0 12/15/2018 1152   MCV 81.4 12/15/2018 1152   MCH 29.2 02/24/2018 0547   MCHC 32.7 12/15/2018 1152   RDW 15.8 (H) 12/15/2018 1152   LYMPHSABS 1.7 12/15/2018 1152   MONOABS 0.5 12/15/2018 1152   EOSABS 0.2 12/15/2018 1152   BASOSABS 0.0 12/15/2018 1152    No results found for: POCLITH, LITHIUM   Lab Results  Component Value Date   CBMZ 6.1 12/15/2018    Never got labs requested in November and reminded at each appt and never got them. .res Assessment: Plan:    Rondalyn was seen  today for follow-up, other and medication refill.  Diagnoses and all orders for this visit:  Bipolar I disorder ([place])  Generalized anxiety disorder  Mild cognitive impairment  Chronic insomnia  Delayed sleep phase syndrome  Low vitamin D level    Greater than 50% of 30-minute face to face time with patient was spent on counseling and coordination of care.  There is ongoing concern about whether the patient is going to be able to take care of herself without assistance.  We discussed Extensive discussion about her bipolar disorder which is under relatively good control at the moment and unusual given the degree of stress that she is experiencing.  She has had previous bouts of delirium with poor self-care and dehydration but she has done a better job of managing this since the last appointment.  She claims she is compliant with medications except as noted.  She is not having adverse side effects with the medication.  Stop Moss Mc is OK.   Discussed potential metabolic side effects associated with atypical antipsychotics, as well as potential risk for movement side effects. Advised pt to contact office if movement  side effects occur.   Discussed importance of her self-care and compliance with medication for her overall mental stability.  Discussed requested release of information by her Eugenia Mcalpine.  Patient agrees to this consent as needed for her self-care.  Patient claims that she will be able to manage legal and other affairs after can passes away.  That is expected within a short period of time given his cancer diagnosis and progression.  Supportive therapy dealing with the severity of the stress associated with [name] cancer.  Emphasized adequate diet and fluid intake to prevent further episodes of delirium and orthostatic hypotension.  FU 4 mos  Lynder Parents, MD, DFAPA    Please see After Visit Summary for patient specific instructions.   Future Appointments  Date Time  Provider [place]  08/10/2019 12:00 PM [facility]    No orders of the defined types were placed in this encounter.        -------------------------------

## 2019-08-10 ENCOUNTER — Ambulatory Visit: Payer: Medicare Other | Attending: Internal Medicine

## 2019-08-10 DIAGNOSIS — Z23 Encounter for immunization: Secondary | ICD-10-CM

## 2019-08-10 NOTE — Progress Notes (Signed)
   Covid-19 Vaccination Clinic  Name:  Rebecca Novak    MRN: [id number] DOB: 1945-12-18  08/10/2019  Rebecca Novak was observed post Covid-19 immunization for 15 minutes without incident. She was provided with Vaccine Information Sheet and instruction to access the V-Safe system.   Rebecca Novak was instructed to call 911 with any severe reactions post vaccine: Marland Kitchen Difficulty breathing  . Swelling of face and throat  . A fast heartbeat  . A bad rash all over body  . Dizziness and weakness   Immunizations Administered    Name Date Dose VIS Date Route   Pfizer COVID-19 Vaccine 08/10/2019 12:17 PM 0.3 mL 05/06/2019 Intramuscular   Manufacturer: [employer]   Lot: [id number]   [place]: [id number]

## 2019-08-11 ENCOUNTER — Ambulatory Visit: Payer: Medicare Other | Admitting: Dermatology

## 2019-08-16 ENCOUNTER — Other Ambulatory Visit: Payer: Self-pay | Admitting: Psychiatry

## 2019-08-17 ENCOUNTER — Other Ambulatory Visit: Payer: Self-pay | Admitting: Psychiatry

## 2019-08-24 ENCOUNTER — Ambulatory Visit: Payer: Medicare Other | Admitting: Dermatology

## 2019-09-02 DIAGNOSIS — M1711 Unilateral primary osteoarthritis, right knee: Secondary | ICD-10-CM | POA: Diagnosis not present

## 2019-09-02 DIAGNOSIS — M7051 Other bursitis of knee, right knee: Secondary | ICD-10-CM | POA: Diagnosis not present

## 2019-09-13 ENCOUNTER — Ambulatory Visit: Payer: Medicare Other | Admitting: Dermatology

## 2019-10-05 ENCOUNTER — Other Ambulatory Visit: Payer: Self-pay | Admitting: Psychiatry

## 2019-10-05 ENCOUNTER — Telehealth: Payer: Self-pay | Admitting: Psychiatry

## 2019-10-05 DIAGNOSIS — D508 Other iron deficiency anemias: Secondary | ICD-10-CM

## 2019-10-05 DIAGNOSIS — E039 Hypothyroidism, unspecified: Secondary | ICD-10-CM

## 2019-10-05 DIAGNOSIS — R7989 Other specified abnormal findings of blood chemistry: Secondary | ICD-10-CM

## 2019-10-05 DIAGNOSIS — G3184 Mild cognitive impairment, so stated: Secondary | ICD-10-CM

## 2019-10-05 NOTE — Telephone Encounter (Signed)
Pt's sister [name] called and is requesting a call back. She stated she called twice before and never got a call back from the nurse. She would like to go over medications. She believes Doreen is not taking her medications correctly/as needed.  Call Back# [phone]

## 2019-10-05 NOTE — Progress Notes (Signed)
Patient called July 19, 2019 reporting that she had inadvertently stopped carbamazepine apparently as much as 6 months ago.  Given that the patient was not notably worse off that medication it was not restarted.    She is no longer on Equetro so we do not need to check that blood level.  We will however check CBC because she has a history of anemia and iron levels because she has a history of iron deficiency.  We will also check a comprehensive metabolic panel to see if there are abnormal abnormalities that could cause confusion.  We will also repeat her thyroid test.  Her last thyroid test in September was normal but the previous one in July was abnormal.  Lab orders were sent to [facility] labs.

## 2019-10-05 NOTE — Telephone Encounter (Signed)
[name] called back to report pt may need labs.

## 2019-10-05 NOTE — Telephone Encounter (Signed)
Patient called July 19, 2019 reporting that she had inadvertently stopped carbamazepine apparently as much as 6 months ago.  Given that the patient was not notably worse off that medication it was not restarted.    She is no longer on Equetro so we do not need to check that blood level.  We will however check CBC because she has a history of anemia and iron levels because she has a history of iron deficiency.  We will also check a comprehensive metabolic panel to see if there are abnormal abnormalities that could cause confusion.  We will also repeat her thyroid test.  Her last thyroid test in September was normal but the previous one in July was abnormal.  Lab orders were sent to [facility] labs.

## 2019-10-07 NOTE — Telephone Encounter (Signed)
Advised Stewart to go to [facility] located at [facility]. [place]. She has their phone # to call as well. Informed her I would also mail the lab orders to her to take just in case they have any trouble.   She agreed and will get those done soon.

## 2019-11-02 ENCOUNTER — Other Ambulatory Visit: Payer: Self-pay | Admitting: Psychiatry

## 2019-11-03 ENCOUNTER — Other Ambulatory Visit: Payer: Self-pay

## 2019-11-03 ENCOUNTER — Encounter: Payer: Self-pay | Admitting: Podiatry

## 2019-11-03 ENCOUNTER — Ambulatory Visit (INDEPENDENT_AMBULATORY_CARE_PROVIDER_SITE_OTHER): Payer: Medicare Other | Admitting: Podiatry

## 2019-11-03 ENCOUNTER — Other Ambulatory Visit: Payer: Self-pay | Admitting: Podiatry

## 2019-11-03 ENCOUNTER — Ambulatory Visit (INDEPENDENT_AMBULATORY_CARE_PROVIDER_SITE_OTHER): Payer: Medicare Other

## 2019-11-03 VITALS — BP 133/74 | HR 75

## 2019-11-03 DIAGNOSIS — M778 Other enthesopathies, not elsewhere classified: Secondary | ICD-10-CM

## 2019-11-03 DIAGNOSIS — M7741 Metatarsalgia, right foot: Secondary | ICD-10-CM

## 2019-11-03 DIAGNOSIS — M79671 Pain in right foot: Secondary | ICD-10-CM

## 2019-11-03 MED ORDER — MELOXICAM 7.5 MG PO TABS
7.5000 mg | ORAL_TABLET | Freq: Every day | ORAL | 0 refills | Status: DC
Start: 2019-11-03 — End: 2020-04-11

## 2019-11-03 NOTE — Progress Notes (Signed)
Subjective:   Patient ID: Rebecca Novak, female   DOB: 74 y.o.   MRN: [id number]   HPI 74 year old female presents the office today for same-day appointment given pain to her right foot.  She states that she was on her feet yesterday and she had no problems with her feet she denies any recent injury or trauma however when she got into bed last night she had severe burning pain to the ball of her foot that last about 30 minutes.  She tried Voltaren gel which did not help and the pain resolved.  She had no pain this morning when she woke up and she is on her feet all day without any pain.  She denies any swelling or redness.  She had no other pain in her back, hip or other areas when she went to bed.  No other concerns today.   Review of Systems  All other systems reviewed and are negative.  Past Medical History:  Diagnosis Date  . ANEMIA, MILD 05/17/2008  . BCC (basal cell carcinoma) 08/08/2009   right cheek (CX35FU +exc. )  . BCC (basal cell carcinoma) 03/12/2011   right sideburn   . BCC (basal cell carcinoma) 12/08/2012   right lower leg (CX35FU)  . BCC (basal cell carcinoma) 04/25/2015   left lower back  . BCC (basal cell carcinoma) 05/27/2016   left shoulder (CX35FU), left cheek (CX35FU)  . COLONIC POLYPS, HX OF 05/17/2007  . Depression    bipolar  . GLUCOSE INTOLERANCE 11/23/2007  . HAND PAIN 07/31/2008  . HYPERLIPIDEMIA 02/05/2007  . HYPOTHYROIDISM 02/05/2007  . 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 SCC (squamous cell carcinoma) 11/18/2007   right lower leg (CX35FU)  . SCC (squamous cell carcinoma) 03/12/2011   left outer calf  . SCC (squamous cell carcinoma) 11/19/2011   left side of chest -tx p bx  . SCC (squamous cell carcinoma) 12/08/2012   left chest (CX35FU)  . SCC (squamous cell carcinoma) 09/28/2014   left arm inferior (CX35FU), right arm, lateral (CX35FU)  . SCC (squamous cell carcinoma) 04/25/2015   rigth forearm   . SCC  (squamous cell carcinoma) 01/10/2017   right shin proximal (CX35FU)  . SCC (squamous cell carcinoma) 04/21/2018   top right hand-tx p bx  . SYSTOLIC MURMUR 70/35/0093    Past Surgical History:  Procedure Laterality Date  . APPENDECTOMY    . TONSILLECTOMY    . VAGINAL HYSTERECTOMY     uncertain reason.      Current Outpatient Medications:  .  carbamazepine (EQUETRO) 200 MG CP12 12 hr capsule, Equetro 200 mg capsule, extended release, Disp: , Rfl:  .  diclofenac (VOLTAREN) 75 MG EC tablet, Take 75 mg by mouth 2 (two) times daily as needed., Disp: , Rfl:  .  diclofenac Sodium (VOLTAREN) 1 % GEL, Voltaren 1 % topical gel  APPLY 2 GRAM TO THE AFFECTED AREA(S) BY TOPICAL ROUTE 2 or 3 TIMES PER DAY, Disp: , Rfl:  .  LATUDA 120 MG TABS, TAKE 1 TABLET EVERY EVENINGWITH A MEAL, Disp: 90 tablet, Rfl: 0 .  levothyroxine (SYNTHROID) 100 MCG tablet, Take 1 tablet (100 mcg total) by mouth daily., Disp: 90 tablet, Rfl: 1 .  meloxicam (MOBIC) 7.5 MG tablet, meloxicam 7.5 mg tablet  take one tablet daily, Disp: , Rfl:  .  meloxicam (MOBIC) 7.5 MG tablet, Take 1 tablet (7.5 mg total) by mouth daily., Disp: 10 tablet, Rfl: 0 .  metoprolol  tartrate (LOPRESSOR) 25 MG tablet, Take 1 tablet (25 mg total) by mouth daily as needed (palpitations)., Disp: 90 tablet, Rfl: 3 .  QUEtiapine (SEROQUEL) 100 MG tablet, TAKE 2 TABLETS (200MG ) AT  BEDTIME, Disp: 180 tablet, Rfl: 1 .  SF 5000 PLUS 1.1 % CREA dental cream, USE ONCE AT BEDTIME IN PLACE OF REGULAR TOOTHPASTE. BRUSH FOR 2 MINUTES EXPECTORATE. DO NOT RINSE, Disp: , Rfl:  .  simvastatin (ZOCOR) 40 MG tablet, TAKE 1 TABLET BY MOUTH AT BEDTIME, Disp: 90 tablet, Rfl: 0 .  zolpidem (AMBIEN) 10 MG tablet, zolpidem 10 mg tablet, Disp: , Rfl:   No Known Allergies       Objective:  Physical Exam  General: AAO x3, NAD  Dermatological: Skin is warm, dry and supple bilateral.  There are no open sores, no preulcerative lesions, no rash or signs of infection  present.  Vascular: Dorsalis Pedis artery and Posterior Tibial artery pedal pulses are 2/4 bilateral with immedate capillary fill time.  There is no pain with calf compression, swelling, warmth, erythema.   Neruologic: Grossly intact via light touch bilateral. Protective threshold with Semmes Wienstein monofilament intact to all pedal sites bilateral.   Musculoskeletal: On exam today there is no area of pinpoint tenderness.  There is no palpable neuroma.  There is no edema, erythema.  Flexor, extensor tendons appear to be intact.  Muscular strength 5/5 in all groups tested bilateral.  Gait: Unassisted, Nonantalgic.       Assessment:   Right foot pain, metatarsalgia/neuritis    Plan:  -Treatment options discussed including all alternatives, risks, and complications -Etiology of symptoms were discussed -X-rays were obtained and reviewed with the patient. There is no evidence of acute fracture or stress fracture identified today. -Discussed with her I am not certain of what caused her pain and she is not had any pain today with walking or swelling.  Discussed likely nerve issues as she is on her feet quite a bit yesterday.  I dispensed metatarsal gel offloading pad.  She can continue Voltaren gel that I also prescribed meloxicam to take if needed.  She has had this previously done well.  If symptoms continue or recur let me know otherwise I will see her as needed.  Return if symptoms worsen or fail to improve.  Trula Slade DPM

## 2019-11-10 ENCOUNTER — Telehealth: Payer: Self-pay | Admitting: Psychiatry

## 2019-11-10 ENCOUNTER — Other Ambulatory Visit: Payer: Self-pay

## 2019-11-10 MED ORDER — QUETIAPINE FUMARATE 100 MG PO TABS: ORAL_TABLET | ORAL | 1 refills | Status: DC

## 2019-11-10 NOTE — Telephone Encounter (Signed)
Pt called need new Rx sent to [facility] mail order [id number] for Equetro & Seroquel. Almost out requested this on Sunday.

## 2019-11-11 ENCOUNTER — Telehealth: Payer: Self-pay | Admitting: Psychiatry

## 2019-11-11 ENCOUNTER — Other Ambulatory Visit: Payer: Self-pay | Admitting: Psychiatry

## 2019-11-11 MED ORDER — CARBAMAZEPINE ER 200 MG PO CP12: ORAL_CAPSULE | ORAL | 0 refills | Status: DC

## 2019-11-11 NOTE — Telephone Encounter (Signed)
Patient called today to say she is almost out of her equetro and could he please send it today to [facility]. If she is no longer suppose to take it please call her back at [phone]

## 2019-11-11 NOTE — Telephone Encounter (Signed)
[facility].  She said at appt she had stopped it but perhaps she was mistaken.  Her memory is not good.

## 2019-11-11 NOTE — Telephone Encounter (Signed)
Noted thank you

## 2019-11-15 DIAGNOSIS — G3184 Mild cognitive impairment, so stated: Secondary | ICD-10-CM | POA: Diagnosis not present

## 2019-11-15 DIAGNOSIS — E039 Hypothyroidism, unspecified: Secondary | ICD-10-CM | POA: Diagnosis not present

## 2019-11-15 DIAGNOSIS — D508 Other iron deficiency anemias: Secondary | ICD-10-CM | POA: Diagnosis not present

## 2019-11-15 DIAGNOSIS — R7989 Other specified abnormal findings of blood chemistry: Secondary | ICD-10-CM | POA: Diagnosis not present

## 2019-11-15 DIAGNOSIS — E559 Vitamin D deficiency, unspecified: Secondary | ICD-10-CM | POA: Diagnosis not present

## 2019-11-18 LAB — TSH: TSH: 6.26 mIU/L — ABNORMAL HIGH (ref 0.40–4.50)

## 2019-11-18 LAB — COMPREHENSIVE METABOLIC PANEL
AG Ratio: 1.7 (calc) (ref 1.0–2.5)
ALT: 15 U/L (ref 6–29)
AST: 19 U/L (ref 10–35)
Albumin: 4 g/dL (ref 3.6–5.1)
Alkaline phosphatase (APISO): 89 U/L (ref 37–153)
BUN/Creatinine Ratio: 24 (calc) — ABNORMAL HIGH (ref 6–22)
BUN: 23 mg/dL (ref 7–25)
CO2: 25 mmol/L (ref 20–32)
Calcium: 8.9 mg/dL (ref 8.6–10.4)
Chloride: 110 mmol/L (ref 98–110)
Creat: 0.96 mg/dL — ABNORMAL HIGH (ref 0.60–0.93)
Globulin: 2.4 g/dL (calc) (ref 1.9–3.7)
Glucose, Bld: 96 mg/dL (ref 65–139)
Potassium: 4.3 mmol/L (ref 3.5–5.3)
Sodium: 142 mmol/L (ref 135–146)
Total Bilirubin: 0.3 mg/dL (ref 0.2–1.2)
Total Protein: 6.4 g/dL (ref 6.1–8.1)

## 2019-11-18 LAB — CBC WITH DIFFERENTIAL/PLATELET
Absolute Monocytes: 461 cells/uL (ref 200–950)
Basophils Absolute: 39 cells/uL (ref 0–200)
Basophils Relative: 0.8 %
Eosinophils Absolute: 88 cells/uL (ref 15–500)
Eosinophils Relative: 1.8 %
HCT: 37.3 % (ref 35.0–45.0)
Hemoglobin: 12 g/dL (ref 11.7–15.5)
Lymphs Abs: 1377 cells/uL (ref 850–3900)
MCH: 26.3 pg — ABNORMAL LOW (ref 27.0–33.0)
MCHC: 32.2 g/dL (ref 32.0–36.0)
MCV: 81.6 fL (ref 80.0–100.0)
MPV: 9.6 fL (ref 7.5–12.5)
Monocytes Relative: 9.4 %
Neutro Abs: 2935 cells/uL (ref 1500–7800)
Neutrophils Relative %: 59.9 %
Platelets: 249 10*3/uL (ref 140–400)
RBC: 4.57 10*6/uL (ref 3.80–5.10)
RDW: 16.9 % — ABNORMAL HIGH (ref 11.0–15.0)
Total Lymphocyte: 28.1 %
WBC: 4.9 10*3/uL (ref 3.8–10.8)

## 2019-11-18 LAB — VITAMIN D 1,25 DIHYDROXY
Vitamin D 1, 25 (OH)2 Total: 47 pg/mL (ref 18–72)
Vitamin D2 1, 25 (OH)2: <8 pg/mL
Vitamin D3 1, 25 (OH)2: 47 pg/mL

## 2019-11-18 LAB — FERRITIN: Ferritin: 10 ng/mL — ABNORMAL LOW (ref 16–288)

## 2019-11-29 NOTE — Progress Notes (Signed)
Please make sure she's taking an iron capsule or tablet every day if tolerated and if not every other day.

## 2019-12-08 ENCOUNTER — Ambulatory Visit: Payer: Medicare Other | Admitting: Psychiatry

## 2019-12-22 ENCOUNTER — Encounter: Payer: Self-pay | Admitting: Psychiatry

## 2019-12-22 ENCOUNTER — Other Ambulatory Visit: Payer: Self-pay

## 2019-12-22 ENCOUNTER — Ambulatory Visit (INDEPENDENT_AMBULATORY_CARE_PROVIDER_SITE_OTHER): Payer: Medicare Other | Admitting: Psychiatry

## 2019-12-22 ENCOUNTER — Ambulatory Visit: Payer: Federal, State, Local not specified - PPO | Admitting: Psychiatry

## 2019-12-22 DIAGNOSIS — F411 Generalized anxiety disorder: Secondary | ICD-10-CM | POA: Diagnosis not present

## 2019-12-22 DIAGNOSIS — G3184 Mild cognitive impairment, so stated: Secondary | ICD-10-CM

## 2019-12-22 DIAGNOSIS — G4721 Circadian rhythm sleep disorder, delayed sleep phase type: Secondary | ICD-10-CM

## 2019-12-22 DIAGNOSIS — F5104 Psychophysiologic insomnia: Secondary | ICD-10-CM

## 2019-12-22 DIAGNOSIS — F319 Bipolar disorder, unspecified: Secondary | ICD-10-CM | POA: Diagnosis not present

## 2019-12-22 DIAGNOSIS — R7989 Other specified abnormal findings of blood chemistry: Secondary | ICD-10-CM

## 2019-12-22 NOTE — Progress Notes (Signed)
Rebecca Novak [id number] 26-Jul-1945 74 y.o.  Subjective:   Patient ID:  Rebecca Novak is a 74 y.o. (DOB 1946/01/23) female.  Chief Complaint:  Chief Complaint  Patient presents with  . Follow-up    mood and meds  . Medication Problem    confusion  . Sleeping Problem   Altered Mental Status   . Depression  . Anxiety  . Memory Loss  . Medication Problem  . Stress  . Sleeping Problem     Medication Refill Associated symptoms include arthralgias. Pertinent negatives include no fatigue or weakness.  Depression        Associated symptoms include decreased concentration.  Associated symptoms include no fatigue, no appetite change and no suicidal ideas.  Past medical history includes anxiety.   Anxiety Symptoms include decreased concentration. Patient reports no confusion, dizziness, nervous/anxious behavior or suicidal ideas.     Rebecca Novak presents to the office today for follow-up of above and bipolar disorder.    Patient called July 19, 2019 reporting that she had inadvertently stopped carbamazepine apparently as much as 6 months ago.  Given that the patient was not notably worse off that medication it was not restarted.  There also had been some question since her last appointment about her consistency with the clonazepam as well.  Disc Rebecca Novak 74 yo unchanged drinking but reads a lot.  12/22/2019 appointment the following is noted: Since being here patient called back and has apparently been confused about whether she was taking Equetro or not as she was asking for refill and indicating that she had never stopped it.  So prescription refills were sent. "I misspoke"  Claims has been taking Equetro 400 mg HS. Continued good mood and no complaints.  Really well.  Anxiety is stable.  Weight and appetite better but diet is not good bc no veges or fruits.  Rebecca Novak has a will.  [employer] has made [name] her POA and agrees for ROI for [name].  Pt feels she can  manage the estate with direction.  2012 visited [place] with friends.  They are here and she's eating more.  Past Psychiatric Medication Trials: Equetro 1200, buspirone, Deplin, Latuda 120, duloxetine, Wellbutrin, Lexapro, sertraline, lithium no response, fluoxetine, Abilify 20, olanzapine, Geodon 160, gabapentin, lamotrigine 100, Depakote, trazodone, Ambien, clonazepam, temazepam, topiramate  Multiple problems. 2  hospitalizations (last 02/23/18) DT confusion and with high CBZ levels, the last was 13.9, but she was also dehydrated.   Dosage reduced now  Review of Systems:  Review of Systems  Constitutional: Negative for activity change, appetite change, fatigue and unexpected weight change.  Gastrointestinal: Negative.   Musculoskeletal: Positive for arthralgias.  Neurological: Negative for dizziness, tremors and weakness.  Psychiatric/Behavioral: Positive for decreased concentration and depression. Negative for agitation, behavioral problems, confusion, dysphoric mood, hallucinations, self-injury, sleep disturbance and suicidal ideas. The patient is not nervous/anxious and is not hyperactive.   Not dizzy.   Medications: I have reviewed the patient's current medications.  I  Current Outpatient Medications  Medication Sig Dispense Refill  . carbamazepine (EQUETRO) 200 MG CP12 12 hr capsule 1 in AM and 2 at night (Patient taking differently: 400 mg at night) 30 capsule 0  . diclofenac (VOLTAREN) 75 MG EC tablet Take 75 mg by mouth 2 (two) times daily as needed.    . diclofenac Sodium (VOLTAREN) 1 % GEL Voltaren 1 % topical gel  APPLY 2 GRAM TO THE AFFECTED AREA(S) BY TOPICAL ROUTE 2 or 3 TIMES PER DAY    .  LATUDA 120 MG TABS TAKE 1 TABLET EVERY EVENINGWITH A MEAL 90 tablet 0  . levothyroxine (SYNTHROID) 100 MCG tablet Take 1 tablet (100 mcg total) by mouth daily. 90 tablet 1  . meloxicam (MOBIC) 7.5 MG tablet meloxicam 7.5 mg tablet  take one tablet daily    . meloxicam (MOBIC) 7.5 MG  tablet Take 1 tablet (7.5 mg total) by mouth daily. 10 tablet 0  . metoprolol tartrate (LOPRESSOR) 25 MG tablet Take 1 tablet (25 mg total) by mouth daily as needed (palpitations). 90 tablet 3  . QUEtiapine (SEROQUEL) 100 MG tablet TAKE 2 TABLETS (200MG ) AT  BEDTIME 180 tablet 1  . SF 5000 PLUS 1.1 % CREA dental cream USE ONCE AT BEDTIME IN PLACE OF REGULAR TOOTHPASTE. BRUSH FOR 2 MINUTES EXPECTORATE. DO NOT RINSE    . simvastatin (ZOCOR) 40 MG tablet TAKE 1 TABLET BY MOUTH AT BEDTIME 90 tablet 0   No current facility-administered medications for this visit.    Medication Side Effects: None  Allergies: No Known Allergies  Past Medical History:  Diagnosis Date  . ANEMIA, MILD 05/17/2008  . BCC (basal cell carcinoma) 08/08/2009   right cheek (CX35FU +exc. )  . BCC (basal cell carcinoma) 03/12/2011   right sideburn   . BCC (basal cell carcinoma) 12/08/2012   right lower leg (CX35FU)  . BCC (basal cell carcinoma) 04/25/2015   left lower back  . BCC (basal cell carcinoma) 05/27/2016   left shoulder (CX35FU), left cheek (CX35FU)  . COLONIC POLYPS, HX OF 05/17/2007  . Depression    bipolar  . GLUCOSE INTOLERANCE 11/23/2007  . HAND PAIN 07/31/2008  . HYPERLIPIDEMIA 02/05/2007  . HYPOTHYROIDISM 02/05/2007  . 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 SCC (squamous cell carcinoma) 11/18/2007   right lower leg (CX35FU)  . SCC (squamous cell carcinoma) 03/12/2011   left outer calf  . SCC (squamous cell carcinoma) 11/19/2011   left side of chest -tx p bx  . SCC (squamous cell carcinoma) 12/08/2012   left chest (CX35FU)  . SCC (squamous cell carcinoma) 09/28/2014   left arm inferior (CX35FU), right arm, lateral (CX35FU)  . SCC (squamous cell carcinoma) 04/25/2015   rigth forearm   . SCC (squamous cell carcinoma) 01/10/2017   right shin proximal (CX35FU)  . SCC (squamous cell carcinoma) 04/21/2018   top right hand-tx p bx  . SYSTOLIC MURMUR 28/78/6767     Family History  Problem Relation Age of Onset  . Dementia Mother   . Healthy Father        until death at age 31  . Mental illness Brother        not been in contact in years  . Healthy Sister     Social History   Socioeconomic History  . Marital status: Married    Spouse name: Rebecca Novak  . Number of children: 0  . Years of education: Not on file  . Highest education level: Not on file  Occupational History  . 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 Smoking status: Former Smoker    Packs/day: 0.33    Years: 44.00    Pack years: 14.52    Types: Cigarettes  . Smokeless tobacco: Never Used  . Tobacco comment: 02/15/2013 "stopped smoking cigarettes ~ 7 yr ago"  Vaping Use  . Vaping Use: Never used  Substance and Sexual Activity  . Alcohol use: No  . Drug use:  No  . Sexual activity: Yes  Other Topics Concern  . Not on file  Social History Narrative  . Not on file   Social Determinants of Health   Financial Resource Strain:   . Difficulty of Paying Living Expenses:   Food Insecurity:   . Worried About Charity fundraiser in the Last Year:   . Arboriculturist in the Last Year:   Transportation Needs:   . Film/video editor (Medical):   Marland Kitchen Lack of Transportation (Non-Medical):   Physical Activity:   . Days of Exercise per Week:   . Minutes of Exercise per Session:   Stress:   . Feeling of Stress :   Social Connections:   . Frequency of Communication with Friends and Family:   . Frequency of Social Gatherings with Friends and Family:   . Attends Religious Services:   . Active Member of Clubs or Organizations:   . Attends Archivist Meetings:   Marland Kitchen Marital Status:   Intimate Partner Violence:   . Fear of Current or Ex-Partner:   . Emotionally Abused:   Marland Kitchen Physically Abused:   . Sexually Abused:     Past Medical History, Surgical history, Social history, and Family history were reviewed and updated as  appropriate.   Rebecca Novak was in rehab awhile.  Back home.  Has terminal CA.  He's now dependent on her and she's always been dependent on him in the past.  She doesn't prepare meals.  Please see review of systems for further details on the patient's review from today.   Objective:   Physical Exam:  There were no vitals taken for this visit.  Physical Exam Constitutional:      General: She is not in acute distress.    Appearance: She is well-developed.  Musculoskeletal:        General: No deformity.  Neurological:     Mental Status: She is alert and oriented to person, place, and time.     Motor: No tremor.     Coordination: Coordination normal.     Gait: Gait normal.  Psychiatric:        Attention and Perception: She is attentive. She does not perceive auditory hallucinations.        Mood and Affect: Mood is not anxious or depressed. Affect is not labile, blunt, angry or inappropriate.        Speech: Speech normal. Speech is not rapid and pressured, delayed or slurred.        Behavior: Behavior normal. Behavior is not agitated, aggressive, withdrawn or hyperactive.        Thought Content: Thought content normal. Thought content is not paranoid or delusional. Thought content does not include homicidal or suicidal ideation. Thought content does not include homicidal or suicidal plan.        Cognition and Memory: Cognition is impaired. Memory is impaired.        Judgment: Judgment normal.     Comments: Fair to poor insight and fair judgment. Less easily confused.     MMSE 27/30, recall 1/3, reduced attention on 03/26/18  Lab Review:     Component Value Date/Time   NA 142 11/15/2019 1523   K 4.3 11/15/2019 1523   CL 110 11/15/2019 1523   CO2 25 11/15/2019 1523   GLUCOSE 96 11/15/2019 1523   GLUCOSE 86 05/05/2006 0904   BUN 23 11/15/2019 1523   CREATININE 0.96 (H) 11/15/2019 1523   CALCIUM 8.9 11/15/2019 1523  PROT 6.4 11/15/2019 1523   ALBUMIN 3.9 12/15/2018 1152   AST 19  11/15/2019 1523   ALT 15 11/15/2019 1523   ALKPHOS 90 12/15/2018 1152   BILITOT 0.3 11/15/2019 1523   GFRNONAA >60 02/24/2018 0547   GFRAA >60 02/24/2018 0547       Component Value Date/Time   WBC 4.9 11/15/2019 1523   RBC 4.57 11/15/2019 1523   HGB 12.0 11/15/2019 1523   HCT 37.3 11/15/2019 1523   HCT 34.9 01/18/2018 0549   PLT 249 11/15/2019 1523   MCV 81.6 11/15/2019 1523   MCH 26.3 (L) 11/15/2019 1523   MCHC 32.2 11/15/2019 1523   RDW 16.9 (H) 11/15/2019 1523   LYMPHSABS 1,377 11/15/2019 1523   MONOABS 0.5 12/15/2018 1152   EOSABS 88 11/15/2019 1523   BASOSABS 39 11/15/2019 1523    No results found for: POCLITH, LITHIUM   Lab Results  Component Value Date   CBMZ 6.1 12/15/2018    Never got labs requested in November and reminded at each appt and never got them. .res Assessment: Plan:    Ortha was seen today for follow-up, medication problem and sleeping problem.  Diagnoses and all orders for this visit:  Bipolar I disorder (HCC)  Chronic insomnia  Delayed sleep phase syndrome  Mild cognitive impairment  Low vitamin D level  Generalized anxiety disorder    Greater than 50% of 30-minute face to face time with patient was spent on counseling and coordination of care.  There is ongoing concern about whether the patient is going to be able to take care of herself without assistance.  We discussed Extensive discussion about her bipolar disorder which is under relatively good control at the moment and unusual given the degree of stress that she is experiencing.  She has had previous bouts of delirium with poor self-care and dehydration but she has done a better job of managing this.  She claims she is compliant with medications except as noted.  She is not having adverse side effects with the medication. Reduced but didn't stop Equetro and mood stability is consistent and tolerated.  Continue Equetro 400 mg HS Latuda 120 mg  Apparently not taking Ambien  (brought meds and it's not in there) Quetiapine 100 mg 2 tablets at night  Discussed potential metabolic side effects associated with atypical antipsychotics, as well as potential risk for movement side effects. Advised pt to contact office if movement side effects occur.   Discussed importance of her self-care and compliance with medication for her overall mental stability.  Supportive therapy dealing with the severity of the stress associated with Rebecca Novak's cancer.  Emphasized adequate diet and fluid intake to prevent further episodes of delirium and orthostatic hypotension.  FU 4 mos  Lynder Parents, MD, DFAPA    Please see After Visit Summary for patient specific instructions.   No future appointments.  No orders of the defined types were placed in this encounter.        -------------------------------

## 2020-01-03 ENCOUNTER — Other Ambulatory Visit: Payer: Self-pay | Admitting: Family Medicine

## 2020-01-03 ENCOUNTER — Telehealth: Payer: Self-pay | Admitting: Family Medicine

## 2020-01-04 NOTE — Telephone Encounter (Signed)
error 

## 2020-01-06 ENCOUNTER — Other Ambulatory Visit: Payer: Self-pay

## 2020-01-06 ENCOUNTER — Telehealth (INDEPENDENT_AMBULATORY_CARE_PROVIDER_SITE_OTHER): Payer: Medicare Other | Admitting: Family Medicine

## 2020-01-06 ENCOUNTER — Encounter: Payer: Self-pay | Admitting: Family Medicine

## 2020-01-06 DIAGNOSIS — Z79899 Other long term (current) drug therapy: Secondary | ICD-10-CM | POA: Diagnosis not present

## 2020-01-06 DIAGNOSIS — F319 Bipolar disorder, unspecified: Secondary | ICD-10-CM

## 2020-01-06 DIAGNOSIS — E785 Hyperlipidemia, unspecified: Secondary | ICD-10-CM | POA: Diagnosis not present

## 2020-01-06 DIAGNOSIS — E611 Iron deficiency: Secondary | ICD-10-CM

## 2020-01-06 DIAGNOSIS — E559 Vitamin D deficiency, unspecified: Secondary | ICD-10-CM

## 2020-01-06 DIAGNOSIS — E039 Hypothyroidism, unspecified: Secondary | ICD-10-CM | POA: Diagnosis not present

## 2020-01-06 DIAGNOSIS — E538 Deficiency of other specified B group vitamins: Secondary | ICD-10-CM

## 2020-01-06 MED ORDER — SIMVASTATIN 40 MG PO TABS: 40.0000 mg | ORAL_TABLET | Freq: Every day | ORAL | 1 refills | Status: DC

## 2020-01-06 NOTE — Progress Notes (Signed)
Virtual Visit via Telephone Note  I connected with Rebecca Novak  on 01/06/20 at 11:30 AM EDT by telephone and verified that I am speaking with the correct person using two identifiers.   I discussed the limitations, risks, security and privacy concerns of performing an evaluation and management service by telephone and the availability of in person appointments. I also discussed with the patient that there may be a patient responsible charge related to this service. The patient expressed understanding and agreed to proceed.  Location patient: home Location provider: work office Participants present for the call: patient, provider Patient did not have a visit in the prior 7 days to address this/these issue(s).   History of Present Illness: Recent visit with psychiatry, clarification of her current medications included Equetro 400 mg nightly, Latuda 120 mg, Seroquel 200 mg at night  States she has been doing really well. No complaints. Feeling healthy. Thinks things are going well and feeling good. Needing simvastatin renewed.   Last colonoscopy was in 2016 and was recommended to repeat this due to poor prep.   Mammogram previously ordered by me and patient states that it was completed. Not sure why we didn't get result.   Hasn't been taking the levothyroxine for a couple of months.    Observations/Objective: Patient sounds cheerful and well on the phone. I do not appreciate any SOB. Speech and thought processing are grossly intact. Patient reported vitals:  Assessment and Plan: 1. Acquired hypothyroidism Patient has not been taking her thyroid medication.  We will obtain baseline labs, and then determine dose to restart her on pending this.  She is not sure how this got left off, so we may do well to consider a pharmacy intervention to help with pill packing and prevent noncompliance in the future. - TSH; Future  2. Dyslipidemia Continue simvastatin 40 mg daily. - Lipid  panel; Future  3. Bipolar affective disorder, remission status unspecified ([place]) Feels that mood has been stable.  She does follow regularly with psychiatry.  4. Iron deficiency Has been deficient in the past.  We will check blood work.  5. B12 deficiency Takes replacement. - Vitamin B12; Future  6. Vitamin D deficiency Take supplement. - VITAMIN D 25 Hydroxy (Vit-D Deficiency, Fractures); Future  7. High risk medication use - CBC with Differential/Platelet; Future - Comprehensive metabolic panel; Future   Follow Up Instructions:  Return for pending labs.   [id number] 5-10 [id number] 11-20 [id number] 21-30 I did not refer this patient for an OV in the next 24 hours for this/these issue(s).  I discussed the assessment and treatment plan with the patient. The patient was provided an opportunity to ask questions and all were answered. The patient agreed with the plan and demonstrated an understanding of the instructions.   The patient was advised to call back or seek an in-person evaluation if the symptoms worsen or if the condition fails to improve as anticipated.  I provided minutes of non-face-to-face time during this encounter.   Micheline Rough, MD

## 2020-01-09 ENCOUNTER — Telehealth: Payer: Self-pay | Admitting: Family Medicine

## 2020-01-09 ENCOUNTER — Other Ambulatory Visit: Payer: Medicare Other

## 2020-01-09 NOTE — Telephone Encounter (Signed)
Pt called to say she picked up her medication of simvastatin (ZOCOR) 40 MG tablet  and it's only for 1 month. She is use to getting 90 day supply and she rather have that.  Please advised

## 2020-01-10 ENCOUNTER — Telehealth: Payer: Self-pay | Admitting: *Deleted

## 2020-01-10 ENCOUNTER — Telehealth: Payer: Self-pay | Admitting: Psychiatry

## 2020-01-10 MED ORDER — SIMVASTATIN 40 MG PO TABS: 40.0000 mg | ORAL_TABLET | Freq: Every day | ORAL | 1 refills | Status: DC

## 2020-01-10 NOTE — Telephone Encounter (Signed)
No answer at the pts home number. 

## 2020-01-10 NOTE — Telephone Encounter (Signed)
Daine called because [facility] called her about needing a refill on her [place].  She told them she doesn't need a refill at this time.  They apparently are going to contact us about the refill so she wanted [place] to know not to refill it because she doesn't need it.

## 2020-01-10 NOTE — Telephone Encounter (Signed)
noted 

## 2020-01-10 NOTE — Telephone Encounter (Signed)
Rx done. 

## 2020-01-11 ENCOUNTER — Other Ambulatory Visit: Payer: Self-pay | Admitting: Psychiatry

## 2020-01-13 ENCOUNTER — Other Ambulatory Visit: Payer: Self-pay

## 2020-01-13 ENCOUNTER — Other Ambulatory Visit: Payer: Medicare Other

## 2020-01-13 DIAGNOSIS — E785 Hyperlipidemia, unspecified: Secondary | ICD-10-CM | POA: Diagnosis not present

## 2020-01-13 DIAGNOSIS — E538 Deficiency of other specified B group vitamins: Secondary | ICD-10-CM

## 2020-01-13 DIAGNOSIS — Z79899 Other long term (current) drug therapy: Secondary | ICD-10-CM | POA: Diagnosis not present

## 2020-01-13 DIAGNOSIS — E559 Vitamin D deficiency, unspecified: Secondary | ICD-10-CM | POA: Diagnosis not present

## 2020-01-13 DIAGNOSIS — E039 Hypothyroidism, unspecified: Secondary | ICD-10-CM

## 2020-01-14 LAB — CBC WITH DIFFERENTIAL/PLATELET
Absolute Monocytes: 414 cells/uL (ref 200–950)
Basophils Absolute: 31 cells/uL (ref 0–200)
Basophils Relative: 0.7 %
Eosinophils Absolute: 141 cells/uL (ref 15–500)
Eosinophils Relative: 3.2 %
HCT: 37.4 % (ref 35.0–45.0)
Hemoglobin: 12.2 g/dL (ref 11.7–15.5)
Lymphs Abs: 1751 cells/uL (ref 850–3900)
MCH: 28 pg (ref 27.0–33.0)
MCHC: 32.6 g/dL (ref 32.0–36.0)
MCV: 85.8 fL (ref 80.0–100.0)
MPV: 9.5 fL (ref 7.5–12.5)
Monocytes Relative: 9.4 %
Neutro Abs: 2064 cells/uL (ref 1500–7800)
Neutrophils Relative %: 46.9 %
Platelets: 196 10*3/uL (ref 140–400)
RBC: 4.36 10*6/uL (ref 3.80–5.10)
RDW: 15.2 % — ABNORMAL HIGH (ref 11.0–15.0)
Total Lymphocyte: 39.8 %
WBC: 4.4 10*3/uL (ref 3.8–10.8)

## 2020-01-14 LAB — COMPREHENSIVE METABOLIC PANEL
AG Ratio: 1.7 (calc) (ref 1.0–2.5)
ALT: 13 U/L (ref 6–29)
AST: 19 U/L (ref 10–35)
Albumin: 3.8 g/dL (ref 3.6–5.1)
Alkaline phosphatase (APISO): 84 U/L (ref 37–153)
BUN/Creatinine Ratio: 23 (calc) — ABNORMAL HIGH (ref 6–22)
BUN: 24 mg/dL (ref 7–25)
CO2: 27 mmol/L (ref 20–32)
Calcium: 8.6 mg/dL (ref 8.6–10.4)
Chloride: 108 mmol/L (ref 98–110)
Creat: 1.03 mg/dL — ABNORMAL HIGH (ref 0.60–0.93)
Globulin: 2.2 g/dL (calc) (ref 1.9–3.7)
Glucose, Bld: 97 mg/dL (ref 65–99)
Potassium: 4 mmol/L (ref 3.5–5.3)
Sodium: 142 mmol/L (ref 135–146)
Total Bilirubin: 0.3 mg/dL (ref 0.2–1.2)
Total Protein: 6 g/dL — ABNORMAL LOW (ref 6.1–8.1)

## 2020-01-14 LAB — TSH: TSH: 6.75 mIU/L — ABNORMAL HIGH (ref 0.40–4.50)

## 2020-01-14 LAB — LIPID PANEL
Cholesterol: 214 mg/dL — ABNORMAL HIGH (ref <200)
HDL: 58 mg/dL (ref >=50)
LDL Cholesterol (Calc): 136 mg/dL (calc) — ABNORMAL HIGH
Non-HDL Cholesterol (Calc): 156 mg/dL (calc) — ABNORMAL HIGH (ref <130)
Total CHOL/HDL Ratio: 3.7 (calc) (ref <5.0)
Triglycerides: 95 mg/dL (ref <150)

## 2020-01-14 LAB — VITAMIN D 25 HYDROXY (VIT D DEFICIENCY, FRACTURES): Vit D, 25-Hydroxy: 13 ng/mL — ABNORMAL LOW (ref 30–100)

## 2020-01-14 LAB — VITAMIN B12: Vitamin B-12: 295 pg/mL (ref 200–1100)

## 2020-01-17 ENCOUNTER — Encounter: Payer: Self-pay | Admitting: *Deleted

## 2020-01-17 MED ORDER — VITAMIN D (ERGOCALCIFEROL) 1.25 MG (50000 UNIT) PO CAPS: 50000.0000 [IU] | ORAL_CAPSULE | ORAL | 0 refills | Status: DC

## 2020-02-01 ENCOUNTER — Other Ambulatory Visit: Payer: Self-pay

## 2020-02-01 ENCOUNTER — Ambulatory Visit (INDEPENDENT_AMBULATORY_CARE_PROVIDER_SITE_OTHER): Payer: Medicare Other | Admitting: Family Medicine

## 2020-02-01 ENCOUNTER — Encounter: Payer: Self-pay | Admitting: Family Medicine

## 2020-02-01 VITALS — BP 106/60 | HR 74 | Temp 98.2°F | Ht 68.0 in | Wt 191.0 lb

## 2020-02-01 DIAGNOSIS — H6123 Impacted cerumen, bilateral: Secondary | ICD-10-CM | POA: Diagnosis not present

## 2020-02-01 NOTE — Patient Instructions (Signed)
Earwax Buildup, Adult The ears produce a substance called earwax that helps keep bacteria out of the ear and protects the skin in the ear canal. Occasionally, earwax can build up in the ear and cause discomfort or hearing loss. What increases the risk? This condition is more likely to develop in people who:  Are female.  Are elderly.  Naturally produce more earwax.  Clean their ears often with cotton swabs.  Use earplugs often.  Use in-ear headphones often.  Wear hearing aids.  Have narrow ear canals.  Have earwax that is overly thick or sticky.  Have eczema.  Are dehydrated.  Have excess hair in the ear canal. What are the signs or symptoms? Symptoms of this condition include:  Reduced or muffled hearing.  A feeling of fullness in the ear or feeling that the ear is plugged.  Fluid coming from the ear.  Ear pain.  Ear itch.  Ringing in the ear.  Coughing.  An obvious piece of earwax that can be seen inside the ear canal. How is this diagnosed? This condition may be diagnosed based on:  Your symptoms.  Your medical history.  An ear exam. During the exam, your health care provider will look into your ear with an instrument called an otoscope. You may have tests, including a hearing test. How is this treated? This condition may be treated by:  Using ear drops to soften the earwax.  Having the earwax removed by a health care provider. The health care provider may: ? Flush the ear with water. ? Use an instrument that has a loop on the end (curette). ? Use a suction device.  Surgery to remove the wax buildup. This may be done in severe cases. Follow these instructions at home:   Take over-the-counter and prescription medicines only as told by your health care provider.  Do not put any objects, including cotton swabs, into your ear. You can clean the opening of your ear canal with a washcloth or facial tissue.  Follow instructions from your health care  provider about cleaning your ears. Do not over-clean your ears.  Drink enough fluid to keep your urine clear or pale yellow. This will help to thin the earwax.  Keep all follow-up visits as told by your health care provider. If earwax builds up in your ears often or if you use hearing aids, consider seeing your health care provider for routine, preventive ear cleanings. Ask your health care provider how often you should schedule your cleanings.  If you have hearing aids, clean them according to instructions from the manufacturer and your health care provider. Contact a health care provider if:  You have ear pain.  You develop a fever.  You have blood, pus, or other fluid coming from your ear.  You have hearing loss.  You have ringing in your ears that does not go away.  Your symptoms do not improve with treatment.  You feel like the room is spinning (vertigo). Summary  Earwax can build up in the ear and cause discomfort or hearing loss.  The most common symptoms of this condition include reduced or muffled hearing and a feeling of fullness in the ear or feeling that the ear is plugged.  This condition may be diagnosed based on your symptoms, your medical history, and an ear exam.  This condition may be treated by using ear drops to soften the earwax or by having the earwax removed by a health care provider.  Do not put any   objects, including cotton swabs, into your ear. You can clean the opening of your ear canal with a washcloth or facial tissue. This information is not intended to replace advice given to you by your health care provider. Make sure you discuss any questions you have with your health care provider. Document Revised: 04/24/2017 Document Reviewed: 07/23/2016 Elsevier Patient Education  2020 [employer]  

## 2020-02-01 NOTE — Progress Notes (Signed)
Established Patient Office Visit  Subjective:  Patient ID: Rebecca Novak, female    DOB: [birth year]  Age: 74 y.o. MRN: [id number]  CC:  Chief Complaint  Patient presents with  . Ear Fullness    HPI Rebecca Novak presents for decreased hearing bilaterally.  She feels her ears are "stopped up ".  Friday evening she placed some earplugs in her ears to drown out noise from outside and she feels her symptoms were worse after that.  She denies any recent tinnitus.  No ear pain.  No drainage.  No dizziness.  At baseline, her hearing is relatively normal.  Past Medical History:  Diagnosis Date  . ANEMIA, MILD 05/17/2008  . BCC (basal cell carcinoma) 08/08/2009   right cheek (CX35FU +exc. )  . BCC (basal cell carcinoma) 03/12/2011   right sideburn   . BCC (basal cell carcinoma) 12/08/2012   right lower leg (CX35FU)  . BCC (basal cell carcinoma) 04/25/2015   left lower back  . BCC (basal cell carcinoma) 05/27/2016   left shoulder (CX35FU), left cheek (CX35FU)  . 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 COLONIC POLYPS, HX OF 05/17/2007  . Depression    bipolar  . GLUCOSE INTOLERANCE 11/23/2007  . HAND PAIN 07/31/2008  . HYPERLIPIDEMIA 02/05/2007  . HYPOTHYROIDISM 02/05/2007  . 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 SCC (squamous cell carcinoma) 11/18/2007   right lower leg (CX35FU)  . SCC (squamous cell carcinoma) 03/12/2011   left outer calf  . SCC (squamous cell carcinoma) 11/19/2011   left side of chest -tx p bx  . SCC (squamous cell carcinoma) 12/08/2012   left chest (CX35FU)  . SCC (squamous cell carcinoma) 09/28/2014   left arm inferior (CX35FU), right arm, lateral (CX35FU)  . SCC (squamous cell carcinoma) 04/25/2015   rigth forearm   . SCC (squamous cell carcinoma) 01/10/2017   right shin proximal (CX35FU)  . SCC (squamous cell carcinoma) 04/21/2018   top right hand-tx p bx  . SYSTOLIC MURMUR 37/90/2409     Past Surgical History:  Procedure Laterality Date  . APPENDECTOMY    . TONSILLECTOMY    . VAGINAL HYSTERECTOMY     uncertain reason.     Family History  Problem Relation Age of Onset  . Dementia Mother   . Healthy Father        until death at age 57  . Mental illness Brother        not been in contact in years  . Healthy Sister     Social History   Socioeconomic History  . Marital status: Married    Spouse name: [name]  . Number of children: 0  . Years of education: Not on file  . Highest education level: Not on file  Occupational History  . 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 Smoking status: Former Smoker    Packs/day: 0.33    Years: 44.00    Pack years: 14.52    Types: Cigarettes  . Smokeless tobacco: Never Used  . Tobacco comment: 02/15/2013 "stopped smoking cigarettes ~ 7 yr ago"  Vaping Use  . Vaping Use: Never used  Substance and Sexual Activity  . Alcohol use: No  . Drug use: No  . Sexual activity: Yes  Other Topics Concern  . Not on file  Social History Narrative  . Not on file   Social Determinants of Health  Financial Resource Strain:   . Difficulty of Paying Living Expenses: Not on file  Food Insecurity:   . Worried About Charity fundraiser in the Last Year: Not on file  . Ran Out of Food in the Last Year: Not on file  Transportation Needs:   . Lack of Transportation (Medical): Not on file  . Lack of Transportation (Non-Medical): Not on file  Physical Activity:   . Days of Exercise per Week: Not on file  . Minutes of Exercise per Session: Not on file  Stress:   . Feeling of Stress : Not on file  Social Connections:   . Frequency of Communication with Friends and Family: Not on file  . Frequency of Social Gatherings with Friends and Family: Not on file  . Attends Religious Services: Not on file  . Active Member of Clubs or Organizations: Not on file  . Attends Archivist  Meetings: Not on file  . Marital Status: Not on file  Intimate Partner Violence:   . Fear of Current or Ex-Partner: Not on file  . Emotionally Abused: Not on file  . Physically Abused: Not on file  . Sexually Abused: Not on file    Outpatient Medications Prior to Visit  Medication Sig Dispense Refill  . carbamazepine (EQUETRO) 200 MG CP12 12 hr capsule 1 in AM and 2 at night (Patient taking differently: 400 mg at night) 30 capsule 0  . diclofenac (VOLTAREN) 75 MG EC tablet Take 75 mg by mouth 2 (two) times daily as needed.    . diclofenac Sodium (VOLTAREN) 1 % GEL Voltaren 1 % topical gel  APPLY 2 GRAM TO THE AFFECTED AREA(S) BY TOPICAL ROUTE 2 or 3 TIMES PER DAY    . LATUDA 120 MG TABS TAKE 1 TABLET EVERY EVENINGWITH A MEAL 90 tablet 0  . levothyroxine (SYNTHROID) 100 MCG tablet Take 1 tablet (100 mcg total) by mouth daily. 90 tablet 1  . meloxicam (MOBIC) 7.5 MG tablet Take 1 tablet (7.5 mg total) by mouth daily. 10 tablet 0  . metoprolol tartrate (LOPRESSOR) 25 MG tablet Take 1 tablet (25 mg total) by mouth daily as needed (palpitations). 90 tablet 3  . QUEtiapine (SEROQUEL) 100 MG tablet TAKE 2 TABLETS (200MG ) AT  BEDTIME 180 tablet 1  . SF 5000 PLUS 1.1 % CREA dental cream USE ONCE AT BEDTIME IN PLACE OF REGULAR TOOTHPASTE. BRUSH FOR 2 MINUTES EXPECTORATE. DO NOT RINSE    . simvastatin (ZOCOR) 40 MG tablet Take 1 tablet (40 mg total) by mouth at bedtime. 90 tablet 1  . Vitamin D, Ergocalciferol, (DRISDOL) 1.25 MG (50000 UNIT) CAPS capsule Take 1 capsule (50,000 Units total) by mouth every 7 (seven) days. 12 capsule 0   No facility-administered medications prior to visit.    No Known Allergies  ROS Review of Systems  Constitutional: Negative for chills and fever.  HENT: Positive for hearing loss. Negative for congestion, ear discharge and ear pain.       Objective:    Physical Exam Vitals reviewed.  Constitutional:      Appearance: Normal appearance.  HENT:     Ears:      Comments: Cerumen impactions bilaterally Cardiovascular:     Rate and Rhythm: Normal rate and regular rhythm.  Pulmonary:     Effort: Pulmonary effort is normal.     Breath sounds: Normal breath sounds.  Neurological:     Mental Status: She is alert.     BP 106/60  Pulse 74   Temp 98.2 F (36.8 C) (Oral)   Ht 5\' 8"  (1.727 m)   Wt 191 lb (86.6 kg)   SpO2 94%   BMI 29.04 kg/m  Wt Readings from Last 3 Encounters:  02/01/20 191 lb (86.6 kg)  04/05/18 167 lb 9.6 oz (76 kg)  03/04/18 167 lb 3.2 oz (75.8 kg)     Health Maintenance Due  Topic Date Due  . COLONOSCOPY  05/14/2018  . INFLUENZA VACCINE  12/25/2019    There are no preventive care reminders to display for this patient.  Lab Results  Component Value Date   TSH 6.75 (H) 01/13/2020   Lab Results  Component Value Date   WBC 4.4 01/13/2020   HGB 12.2 01/13/2020   HCT 37.4 01/13/2020   MCV 85.8 01/13/2020   PLT 196 01/13/2020   Lab Results  Component Value Date   NA 142 01/13/2020   K 4.0 01/13/2020   CO2 27 01/13/2020   GLUCOSE 97 01/13/2020   BUN 24 01/13/2020   CREATININE 1.03 (H) 01/13/2020   BILITOT 0.3 01/13/2020   ALKPHOS 90 12/15/2018   AST 19 01/13/2020   ALT 13 01/13/2020   PROT 6.0 (L) 01/13/2020   ALBUMIN 3.9 12/15/2018   CALCIUM 8.6 01/13/2020   ANIONGAP 8 02/24/2018   GFR 51.65 (L) 12/15/2018   Lab Results  Component Value Date   CHOL 214 (H) 01/13/2020   Lab Results  Component Value Date   HDL 58 01/13/2020   Lab Results  Component Value Date   LDLCALC 136 (H) 01/13/2020   Lab Results  Component Value Date   TRIG 95 01/13/2020   Lab Results  Component Value Date   CHOLHDL 3.7 01/13/2020   Lab Results  Component Value Date   HGBA1C 5.7 02/02/2015      Assessment & Plan:   Problem List Items Addressed This Visit    None    Visit Diagnoses    Bilateral impacted cerumen    -  Primary    -Discussed risk and benefits of irrigation with patient including risk  of pain, low risk of bleeding, very low risk of eardrum perforation.  Patient consented.  Irrigation per nurse of both ear canals and patient tolerated well.  Cerumen plugs were removed bilaterally and her eardrums appeared normal.  She also noted improvement in hearing after irrigation with no adverse side effects.    No orders of the defined types were placed in this encounter.   Follow-up: No follow-ups on file.    Carolann Littler, MD

## 2020-02-03 ENCOUNTER — Other Ambulatory Visit: Payer: Self-pay | Admitting: Family Medicine

## 2020-02-04 ENCOUNTER — Other Ambulatory Visit: Payer: Self-pay | Admitting: Family Medicine

## 2020-02-06 ENCOUNTER — Telehealth: Payer: Self-pay | Admitting: Family Medicine

## 2020-02-06 MED ORDER — SIMVASTATIN 40 MG PO TABS
40.0000 mg | ORAL_TABLET | Freq: Every day | ORAL | 1 refills | Status: DC
Start: 2020-02-06 — End: 2020-03-05

## 2020-02-06 NOTE — Telephone Encounter (Signed)
Rx done. 

## 2020-02-06 NOTE — Telephone Encounter (Signed)
Pt is calling because she needs a refill on her medication   simvastatin (ZOCOR) 40 MG tablet   Please send to [facility]  [place] [address]  Phone:  [phone] Fax:  [phone]   Please advise

## 2020-02-10 ENCOUNTER — Telehealth: Payer: Self-pay | Admitting: Psychiatry

## 2020-02-10 NOTE — Telephone Encounter (Signed)
Rebecca Novak, Shatira's Sister, called to inquire on the status of a letter from you about Usha not being in sound mind as should not be financially responsible for her brothers trust account especially as it relates to taxes due for 2017 and 2018.  Do know of or have a letter being drafted to address this?

## 2020-02-14 ENCOUNTER — Telehealth: Payer: Self-pay | Admitting: Family Medicine

## 2020-02-14 NOTE — Progress Notes (Signed)
  Chronic Care Management   Note  02/14/2020 Name: Rebecca Novak MRN: [id number] DOB: 1945/12/18  Rebecca Novak is a 74 y.o. year old female who is a primary care patient of Koberlein, Steele Berg, MD. I reached out to Rebecca Novak by phone today in response to a referral sent by Ms. Luiz Ochoa Cando's PCP, Caren Macadam, MD.   Rebecca Novak was given information about Chronic Care Management services today including:  1. CCM service includes personalized support from designated clinical staff supervised by her physician, including individualized plan of care and coordination with other care providers 2. 24/7 contact phone numbers for assistance for urgent and routine care needs. 3. Service will only be billed when office clinical staff spend 20 minutes or more in a month to coordinate care. 4. Only one practitioner may furnish and bill the service in a calendar month. 5. The patient may stop CCM services at any time (effective at the end of the month) by phone call to the office staff.   Patient agreed to services and verbal consent obtained.   Follow up plan:   Carley Perdue [employer] Scheduler

## 2020-02-20 DIAGNOSIS — Z0289 Encounter for other administrative examinations: Secondary | ICD-10-CM

## 2020-02-21 NOTE — Telephone Encounter (Signed)
Letter written

## 2020-02-24 ENCOUNTER — Telehealth: Payer: Self-pay | Admitting: Family Medicine

## 2020-02-24 NOTE — Telephone Encounter (Signed)
Pt call and want dr.Koberlein to give her a call back today to talk about her husband.

## 2020-02-24 NOTE — Telephone Encounter (Signed)
Called patient back; will document in Rebecca Novak's chart.

## 2020-02-24 NOTE — Telephone Encounter (Signed)
Pt is calling in stating that she really needs to speak with Dr. Ethlyn Novak and stated that it will only take 3-4 mins of her time b/c it is very important that she hear back from her before the weekend.  Pt is aware that we ask for 24-48 hrs to reply back to msgs.

## 2020-02-24 NOTE — Telephone Encounter (Signed)
Spoke with the pt and she stated her partner Elnora Morrison is sick.  Please see pts chart.

## 2020-03-05 ENCOUNTER — Other Ambulatory Visit: Payer: Self-pay | Admitting: Family Medicine

## 2020-03-07 ENCOUNTER — Other Ambulatory Visit: Payer: Self-pay

## 2020-03-07 ENCOUNTER — Ambulatory Visit: Payer: Medicare Other

## 2020-03-14 ENCOUNTER — Ambulatory Visit: Payer: Medicare Other

## 2020-03-14 ENCOUNTER — Other Ambulatory Visit: Payer: Self-pay

## 2020-03-24 ENCOUNTER — Other Ambulatory Visit: Payer: Self-pay | Admitting: Psychiatry

## 2020-03-27 ENCOUNTER — Ambulatory Visit: Payer: Medicare Other

## 2020-03-27 ENCOUNTER — Other Ambulatory Visit: Payer: Self-pay

## 2020-03-28 ENCOUNTER — Other Ambulatory Visit: Payer: Self-pay | Admitting: Family Medicine

## 2020-04-04 ENCOUNTER — Ambulatory Visit: Payer: Medicare Other

## 2020-04-04 ENCOUNTER — Other Ambulatory Visit: Payer: Self-pay

## 2020-04-11 ENCOUNTER — Telehealth: Payer: Self-pay | Admitting: Pharmacist

## 2020-04-11 ENCOUNTER — Ambulatory Visit (INDEPENDENT_AMBULATORY_CARE_PROVIDER_SITE_OTHER): Payer: Medicare Other

## 2020-04-11 ENCOUNTER — Other Ambulatory Visit: Payer: Self-pay

## 2020-04-11 ENCOUNTER — Ambulatory Visit: Payer: Medicare Other

## 2020-04-11 DIAGNOSIS — Z0000 Encounter for general adult medical examination without abnormal findings: Secondary | ICD-10-CM | POA: Diagnosis not present

## 2020-04-11 NOTE — Chronic Care Management (AMB) (Signed)
I spoke with the patient about the  appointment on 04/12/2020 at 2:00pm. Unfortunately, she is not able to make this appointment it has been changed to December the 16th at 1:00 PM.   Rebecca Novak Rebecca Novak) Rebecca Novak, [place] Assistant [phone]

## 2020-04-11 NOTE — Progress Notes (Signed)
Virtual Visit via Telephone Note  I connected with  Rebecca Novak on 04/11/20 at  1:45 PM EST by telephone and verified that I am speaking with the correct person using two identifiers.  Medicare Annual Wellness visit completed telephonically due to Covid-19 pandemic.   Persons participating in this call: This Health Coach and this patient.   Location: Patient: Home Provider: Office   I discussed the limitations, risks, security and privacy concerns of performing an evaluation and management service by telephone and the availability of in person appointments. The patient expressed understanding and agreed to proceed.  Unable to perform video visit due to video visit attempted and failed and/or patient does not have video capability.   Some vital signs may be absent or patient reported.   Willette Brace, LPN    Subjective:   Rebecca Novak is a 74 y.o. female who presents for Medicare Annual (Subsequent) preventive examination.  Review of Systems     Cardiac Risk Factors include: advanced age (>58men, >41 women);dyslipidemia     Objective:    There were no vitals filed for this visit. There is no height or weight on file to calculate BMI.  Advanced Directives 04/11/2020 02/24/2018 02/18/2018 02/04/2018 01/19/2018 01/18/2018 01/17/2018  Does Patient Have a Medical Advance Directive? Yes Yes Yes Yes Yes Yes Yes  Type of Advance Directive Living will [place];Living will [place];Living will [place];Living will - Healthcare Power of [place]  Does patient want to make changes to medical advance directive? - No - Patient declined - No - Patient declined - No - Patient declined -  Copy of [place] in Chart? - No - copy requested No - copy requested No - copy requested - - -  Would patient like information on creating a medical advance directive? - - - - - - -     Current Medications (verified) Outpatient Encounter Medications as of 04/11/2020  Medication Sig  . carbamazepine (EQUETRO) 200 MG CP12 12 hr capsule 1 in AM and 2 at night (Patient taking differently: 400 mg at night)  . LATUDA 120 MG TABS TAKE 1 TABLET EVERY EVENINGWITH A MEAL  . levothyroxine (SYNTHROID) 100 MCG tablet Take 1 tablet (100 mcg total) by mouth daily.  . metoprolol tartrate (LOPRESSOR) 25 MG tablet Take 1 tablet (25 mg total) by mouth daily as needed (palpitations).  . QUEtiapine (SEROQUEL) 100 MG tablet TAKE 2 TABLETS (200MG ) AT  BEDTIME  . SF 5000 PLUS 1.1 % CREA dental cream USE ONCE AT BEDTIME IN PLACE OF REGULAR TOOTHPASTE. BRUSH FOR 2 MINUTES EXPECTORATE. DO NOT RINSE  . simvastatin (ZOCOR) 40 MG tablet TAKE 1 TABLET BY MOUTH AT BEDTIME  . Vitamin D, Ergocalciferol, (DRISDOL) 1.25 MG (50000 UNIT) CAPS capsule Take 1 capsule (50,000 Units total) by mouth every 7 (seven) days.  . [DISCONTINUED] Calcium Carbonate (CALCIUM 500 PO) Take 2 tablets by mouth daily.    . [DISCONTINUED] diclofenac (VOLTAREN) 75 MG EC tablet Take 75 mg by mouth 2 (two) times daily as needed. (Patient not taking: Reported on 04/11/2020)  . [DISCONTINUED] diclofenac Sodium (VOLTAREN) 1 % GEL Voltaren 1 % topical gel  APPLY 2 GRAM TO THE AFFECTED AREA(S) BY TOPICAL ROUTE 2 or 3 TIMES PER DAY (Patient not taking: Reported on 04/11/2020)  . [DISCONTINUED] meloxicam (MOBIC) 7.5 MG tablet Take 1 tablet (7.5 mg total) by mouth daily. (Patient not taking: Reported on 04/11/2020)  No facility-administered encounter medications on file as of 04/11/2020.    Allergies (verified) Patient has no known allergies.   History: Past Medical History:  Diagnosis Date  . ANEMIA, MILD 05/17/2008  . BCC (basal cell carcinoma) 08/08/2009   right cheek (CX35FU +exc. )  . BCC (basal cell carcinoma) 03/12/2011   right sideburn   . BCC (basal cell carcinoma) 12/08/2012   right lower leg (CX35FU)  . BCC (basal  cell carcinoma) 04/25/2015   left lower back  . BCC (basal cell carcinoma) 05/27/2016   left shoulder (CX35FU), left cheek (CX35FU)  . 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 COLONIC POLYPS, HX OF 05/17/2007  . Depression    bipolar  . GLUCOSE INTOLERANCE 11/23/2007  . HAND PAIN 07/31/2008  . HYPERLIPIDEMIA 02/05/2007  . HYPOTHYROIDISM 02/05/2007  . 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 SCC (squamous cell carcinoma) 11/18/2007   right lower leg (CX35FU)  . SCC (squamous cell carcinoma) 03/12/2011   left outer calf  . SCC (squamous cell carcinoma) 11/19/2011   left side of chest -tx p bx  . SCC (squamous cell carcinoma) 12/08/2012   left chest (CX35FU)  . SCC (squamous cell carcinoma) 09/28/2014   left arm inferior (CX35FU), right arm, lateral (CX35FU)  . SCC (squamous cell carcinoma) 04/25/2015   rigth forearm   . SCC (squamous cell carcinoma) 01/10/2017   right shin proximal (CX35FU)  . SCC (squamous cell carcinoma) 04/21/2018   top right hand-tx p bx  . SYSTOLIC MURMUR 19/41/7408   Past Surgical History:  Procedure Laterality Date  . APPENDECTOMY    . TONSILLECTOMY    . VAGINAL HYSTERECTOMY     uncertain reason.    Family History  Problem Relation Age of Onset  . Dementia Mother   . Healthy Father        until death at age 28  . Mental illness Brother        not been in contact in years  . Healthy Sister    Social History   Socioeconomic History  . Marital status: Married    Spouse name: [name]  . Number of children: 0  . Years of education: Not on file  . Highest education level: Not on file  Occupational History  . 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 Smoking status: Former Smoker    Packs/day: 0.33    Years: 44.00    Pack years: 14.52    Types: Cigarettes  . Smokeless tobacco: Never Used  . Tobacco comment: 02/15/2013 "stopped smoking cigarettes ~ 7  yr ago"  Vaping Use  . Vaping Use: Never used  Substance and Sexual Activity  . Alcohol use: No  . Drug use: No  . Sexual activity: Yes  Other Topics Concern  . Not on file  Social History Narrative  . Not on file   Social Determinants of Health   Financial Resource Strain: Low Risk   . Difficulty of Paying Living Expenses: Not hard at all  Food Insecurity: No Food Insecurity  . Worried About Charity fundraiser in the Last Year: Never true  . Ran Out of Food in the Last Year: Never true  Transportation Needs: No Transportation Needs  . Lack of Transportation (Medical): No  . Lack of Transportation (Non-Medical): No  Physical Activity: Inactive  . Days of Exercise per Week: 0 days  . Minutes of Exercise per  Session: 0 min  Stress: No Stress Concern Present  . Feeling of Stress : Not at all  Social Connections: Moderately Integrated  . Frequency of Communication with Friends and Family: Three times a week  . Frequency of Social Gatherings with Friends and Family: Three times a week  . Attends Religious Services: 1 to 4 times per year  . Active Member of Clubs or Organizations: No  . Attends Archivist Meetings: Never  . Marital Status: Married    Tobacco Counseling Counseling given: Not Answered Comment: 02/15/2013 "stopped smoking cigarettes ~ 7 yr ago"   Clinical Intake:  Pre-visit preparation completed: Yes  Pain : No/denies pain     BMI - recorded: 29.05 Nutritional Status: BMI 25 -29 Overweight Nutritional Risks: None Diabetes: No  How often do you need to have someone help you when you read instructions, pamphlets, or other written materials from your doctor or pharmacy?: 1 - Never  Diabetic?No  Interpreter Needed?: No  Information entered by :: Charlott Rakes, LPN   Activities of Daily Living In your present state of health, do you have any difficulty performing the following activities: 04/11/2020  Hearing? N  Vision? N  Difficulty  concentrating or making decisions? N  Walking or climbing stairs? N  Dressing or bathing? N  Doing errands, shopping? N  Preparing Food and eating ? N  Using the Toilet? N  In the past six months, have you accidently leaked urine? N  Do you have problems with loss of bowel control? N  Managing your Medications? N  Managing your Finances? N  Housekeeping or managing your Housekeeping? N  Some recent data might be hidden    Patient Care Team: Caren Macadam, MD as PCP - General (Family Medicine) Sherren Mocha, MD as PCP - Cardiology (Cardiology) Cottle, Billey Co., MD as Attending Physician (Psychiatry) Viona Gilmore, [facility] as Pharmacist (Pharmacist)  Indicate any recent Medical Services you may have received from other than [facility] providers in the past year (date may be approximate).     Assessment:   This is a routine wellness examination for [facility].  Hearing/Vision screen  Hearing Screening   125Hz  250Hz  500Hz  1000Hz  2000Hz  3000Hz  4000Hz  6000Hz  8000Hz   Right ear:           Left ear:           Comments: Denies any hearing issues  Vision Screening Comments: Pt follows up with Dr Katy Fitch for annual eye exams  Dietary issues and exercise activities discussed:    Goals    . Exercise 150 min/wk Moderate Activity     Will start walking in the neighborhood. In the early afternoon     . Patient Stated     Stay healthy      Depression Screen PHQ 2/9 Scores 04/11/2020 02/22/2018 02/04/2018 08/05/2017 06/26/2017 04/07/2016 02/07/2015  PHQ - 2 Score 0 0 0 0 0 0 0  PHQ- 9 Score - - - - 0 - -    Fall Risk Fall Risk  04/11/2020 02/18/2018 08/05/2017 04/30/2017 05/02/2016  Falls in the past year? 0 Yes No No No  Comment - - - Emmi Telephone Survey: data to providers prior to load Emmi Telephone Survey: data to providers prior to load  Number falls in past yr: 0 2 or more - - -  Injury with Fall? 0 No - - -  Risk Factor Category  - High Fall Risk - - -  Risk for fall due to  :  Impaired vision History of fall(s);Impaired balance/gait - - -  Follow up Falls prevention discussed - - - -    Any stairs in or around the home? Yes  If so, are there any without handrails? No  Home free of loose throw rugs in walkways, pet beds, electrical cords, etc? Yes  Adequate lighting in your home to reduce risk of falls? Yes   ASSISTIVE DEVICES UTILIZED TO PREVENT FALLS:  Life alert? No  Use of a cane, walker or w/c? No  Grab bars in the bathroom? Yes  Shower chair or bench in shower? Yes  Elevated toilet seat or a handicapped toilet? No   TIMED UP AND GO:  Was the test performed? No .      Cognitive Function: MMSE - Mini Mental State Exam 08/05/2017  Not completed: (No Data)  Some encounter information is confidential and restricted. Go to Review Flowsheets activity to see all data.     6CIT Screen 04/11/2020  What Year? 0 points  What month? 0 points  Count back from 20 0 points  Months in reverse 4 points  Repeat phrase 0 points    Immunizations Immunization History  Administered Date(s) Administered  . Fluad Quad(high Dose 65+) 03/23/2019  . Influenza Split 03/06/2011, 03/16/2012  . Influenza Whole 03/05/2007, 03/07/2008, 02/22/2009, 02/14/2010  . Influenza, High Dose Seasonal PF 03/06/2015, 03/17/2016, 02/26/2017, 03/04/2018  . Influenza,inj,Quad PF,6+ Mos 03/18/2013, 03/23/2014  . Influenza,inj,quad, With Preservative 02/23/2018  . Influenza-Unspecified 03/05/2020  . PFIZER SARS-COV-2 Vaccination 07/17/2019, 08/10/2019  . Pneumococcal Conjugate-13 02/13/2014  . Pneumococcal Polysaccharide-23 12/18/2010  . Td 11/08/2008  . Tdap 01/04/2012  . Zoster 05/16/2015    TDAP status: Up to date Flu Vaccine status: Up to date Pneumococcal vaccine status: Up to date Covid-19 vaccine status: Completed vaccines  Qualifies for Shingles Vaccine? Yes   Zostavax completed Yes   Shingrix Completed?: No.    Education has been provided regarding the  importance of this vaccine. Patient has been advised to call insurance company to determine out of pocket expense if they have not yet received this vaccine. Advised may also receive vaccine at local pharmacy or Health Dept. Verbalized acceptance and understanding.  Screening Tests Health Maintenance  Topic Date Due  . COLONOSCOPY  04/11/2021 (Originally 05/14/2018)  . TETANUS/TDAP  01/03/2022  . INFLUENZA VACCINE  Completed  . DEXA SCAN  Completed  . COVID-19 Vaccine  Completed  . Hepatitis C Screening  Completed  . PNA vac Low Risk Adult  Completed    Health Maintenance  There are no preventive care reminders to display for this patient.  Colorectal cancer screening: Completed 05/15/15. Repeat every 10 years Mammogram status: Completed 05/09/15. Repeat every year Bone Density status: Completed 05/11/14. Results reflect: Bone density results: NORMAL. Repeat every 3-5 years.   Additional Screening:  Hepatitis C Screening: Completed 12/24/11  Vision Screening: Recommended annual ophthalmology exams for early detection of glaucoma and other disorders of the eye. Is the patient up to date with their annual eye exam?  Yes  Who is the provider or what is the name of the office in which the patient attends annual eye exams? Dr Katy Fitch    Dental Screening: Recommended annual dental exams for proper oral hygiene  Community Resource Referral / Chronic Care Management: CRR required this visit?  No   CCM required this visit?  No      Plan:     I have personally reviewed and noted the following in the patient's chart:   .  Medical and social history . Use of alcohol, tobacco or illicit drugs  . Current medications and supplements . Functional ability and status . Nutritional status . Physical activity . Advanced directives . List of other physicians . Hospitalizations, surgeries, and ER visits in previous 12 months . Vitals . Screenings to include cognitive, depression, and  falls . Referrals and appointments  In addition, I have reviewed and discussed with patient certain preventive protocols, quality metrics, and best practice recommendations. A written personalized care plan for preventive services as well as general preventive health recommendations were provided to patient.     Willette Brace, LPN   [id number]   Nurse Notes: None

## 2020-04-11 NOTE — Patient Instructions (Addendum)
Rebecca Novak , Thank you for taking time to come for your Medicare Wellness Visit. I appreciate your ongoing commitment to your health goals. Please review the following plan we discussed and let me know if I can assist you in the future.   Screening recommendations/referrals: Colonoscopy: Done 05/15/15 Mammogram: Done 05/09/15 Bone Density: Done 05/11/14 Recommended yearly ophthalmology/optometry visit for glaucoma screening and checkup Recommended yearly dental visit for hygiene and checkup  Vaccinations: Influenza vaccine: Done 03/05/20 Up to date Pneumococcal vaccine: Up to date Tdap vaccine: Up to date Shingles vaccine: Shingrix discussed. Please contact your pharmacy for coverage information.    Covid-19:Completed 2/2 & 08/10/19  Advanced directives: Please bring a copy of your health care power of attorney and living will to the office at your convenience.  Conditions/risks identified: Stay Healthy  Next appointment: Follow up in one year for your annual wellness visit    Preventive Care 65 Years and Older, Female Preventive care refers to lifestyle choices and visits with your health care provider that can promote health and wellness. What does preventive care include?  A yearly physical exam. This is also called an annual well check.  Dental exams once or twice a year.  Routine eye exams. Ask your health care provider how often you should have your eyes checked.  Personal lifestyle choices, including:  Daily care of your teeth and gums.  Regular physical activity.  Eating a healthy diet.  Avoiding tobacco and drug use.  Limiting alcohol use.  Practicing safe sex.  Taking low-dose aspirin every day.  Taking vitamin and mineral supplements as recommended by your health care provider. What happens during an annual well check? The services and screenings done by your health care provider during your annual well check will depend on your age, overall health,  lifestyle risk factors, and family history of disease. Counseling  Your health care provider may ask you questions about your:  Alcohol use.  Tobacco use.  Drug use.  Emotional well-being.  Home and relationship well-being.  Sexual activity.  Eating habits.  History of falls.  Memory and ability to understand (cognition).  Work and work Statistician.  Reproductive health. Screening  You may have the following tests or measurements:  Height, weight, and BMI.  Blood pressure.  Lipid and cholesterol levels. These may be checked every 5 years, or more frequently if you are over 74 years old.  Skin check.  Lung cancer screening. You may have this screening every year starting at age 32 if you have a 30-pack-year history of smoking and currently smoke or have quit within the past 15 years.  Fecal occult blood test (FOBT) of the stool. You may have this test every year starting at age 72.  Flexible sigmoidoscopy or colonoscopy. You may have a sigmoidoscopy every 5 years or a colonoscopy every 10 years starting at age 67.  Hepatitis C blood test.  Hepatitis B blood test.  Sexually transmitted disease (STD) testing.  Diabetes screening. This is done by checking your blood sugar (glucose) after you have not eaten for a while (fasting). You may have this done every 1-3 years.  Bone density scan. This is done to screen for osteoporosis. You may have this done starting at age 84.  Mammogram. This may be done every 1-2 years. Talk to your health care provider about how often you should have regular mammograms. Talk with your health care provider about your test results, treatment options, and if necessary, the need for more tests. Vaccines  Your health care provider may recommend certain vaccines, such as:  Influenza vaccine. This is recommended every year.  Tetanus, diphtheria, and acellular pertussis (Tdap, Td) vaccine. You may need a Td booster every 10 years.  Zoster  vaccine. You may need this after age 38.  Pneumococcal 13-valent conjugate (PCV13) vaccine. One dose is recommended after age 34.  Pneumococcal polysaccharide (PPSV23) vaccine. One dose is recommended after age 38. Talk to your health care provider about which screenings and vaccines you need and how often you need them. This information is not intended to replace advice given to you by your health care provider. Make sure you discuss any questions you have with your health care provider. Document Released: 06/08/2015 Document Revised: 01/30/2016 Document Reviewed: 03/13/2015 Elsevier Interactive Patient Education  2017 [place] Prevention in the Home Falls can cause injuries. They can happen to people of all ages. There are many things you can do to make your home safe and to help prevent falls. What can I do on the outside of my home?  Regularly fix the edges of walkways and driveways and fix any cracks.  Remove anything that might make you trip as you walk through a door, such as a raised step or threshold.  Trim any bushes or trees on the path to your home.  Use bright outdoor lighting.  Clear any walking paths of anything that might make someone trip, such as rocks or tools.  Regularly check to see if handrails are loose or broken. Make sure that both sides of any steps have handrails.  Any raised decks and porches should have guardrails on the edges.  Have any leaves, snow, or ice cleared regularly.  Use sand or salt on walking paths during winter.  Clean up any spills in your garage right away. This includes oil or grease spills. What can I do in the bathroom?  Use night lights.  Install grab bars by the toilet and in the tub and shower. Do not use towel bars as grab bars.  Use non-skid mats or decals in the tub or shower.  If you need to sit down in the shower, use a plastic, non-slip stool.  Keep the floor dry. Clean up any water that spills on the  floor as soon as it happens.  Remove soap buildup in the tub or shower regularly.  Attach bath mats securely with double-sided non-slip rug tape.  Do not have throw rugs and other things on the floor that can make you trip. What can I do in the bedroom?  Use night lights.  Make sure that you have a light by your bed that is easy to reach.  Do not use any sheets or blankets that are too big for your bed. They should not hang down onto the floor.  Have a firm chair that has side arms. You can use this for support while you get dressed.  Do not have throw rugs and other things on the floor that can make you trip. What can I do in the kitchen?  Clean up any spills right away.  Avoid walking on wet floors.  Keep items that you use a lot in easy-to-reach places.  If you need to reach something above you, use a strong step stool that has a grab bar.  Keep electrical cords out of the way.  Do not use floor polish or wax that makes floors slippery. If you must use wax, use non-skid floor wax.  Do  not have throw rugs and other things on the floor that can make you trip. What can I do with my stairs?  Do not leave any items on the stairs.  Make sure that there are handrails on both sides of the stairs and use them. Fix handrails that are broken or loose. Make sure that handrails are as long as the stairways.  Check any carpeting to make sure that it is firmly attached to the stairs. Fix any carpet that is loose or worn.  Avoid having throw rugs at the top or bottom of the stairs. If you do have throw rugs, attach them to the floor with carpet tape.  Make sure that you have a light switch at the top of the stairs and the bottom of the stairs. If you do not have them, ask someone to add them for you. What else can I do to help prevent falls?  Wear shoes that:  Do not have high heels.  Have rubber bottoms.  Are comfortable and fit you well.  Are closed at the toe. Do not wear  sandals.  If you use a stepladder:  Make sure that it is fully opened. Do not climb a closed stepladder.  Make sure that both sides of the stepladder are locked into place.  Ask someone to hold it for you, if possible.  Clearly mark and make sure that you can see:  Any grab bars or handrails.  First and last steps.  Where the edge of each step is.  Use tools that help you move around (mobility aids) if they are needed. These include:  Canes.  Walkers.  Scooters.  Crutches.  Turn on the lights when you go into a dark area. Replace any light bulbs as soon as they burn out.  Set up your furniture so you have a clear path. Avoid moving your furniture around.  If any of your floors are uneven, fix them.  If there are any pets around you, be aware of where they are.  Review your medicines with your doctor. Some medicines can make you feel dizzy. This can increase your chance of falling. Ask your doctor what other things that you can do to help prevent falls. This information is not intended to replace advice given to you by your health care provider. Make sure you discuss any questions you have with your health care provider. Document Released: 03/08/2009 Document Revised: 10/18/2015 Document Reviewed: 06/16/2014 Elsevier Interactive Patient Education  2017 [employer].

## 2020-04-12 ENCOUNTER — Ambulatory Visit: Payer: Medicare Other

## 2020-04-23 ENCOUNTER — Telehealth: Payer: Self-pay | Admitting: Psychiatry

## 2020-04-23 NOTE — Telephone Encounter (Signed)
Pt's sister called in to ask how many Carbamazepine's does pt suppose to take . I Read what is in system to sister,but she would like to speak to nurse.

## 2020-04-24 NOTE — Telephone Encounter (Signed)
Thank you for the information.

## 2020-04-24 NOTE — Telephone Encounter (Signed)
Rtc to [name], she was asking about Catherine's carbamazepine dose. Confirmed she is taking 400 mg at hs. She also reports [name] passed away on 08-26-22. Jaidence is suprisingly doing well, she's learned out to use a cell phone, she's making calls that need to be made while [name] is working. She is staying with [name] but going back to house some as well. They will be working and figuring out things. She has apt on 05/03/20 [name] was also mentioning her memory, which has been declining some she noticed. Wanted to make Dr. Clovis Pu aware. Informed her I would update him and things can be discussed at her upcoming apt.

## 2020-04-30 ENCOUNTER — Ambulatory Visit: Payer: Medicare Other

## 2020-05-02 ENCOUNTER — Ambulatory Visit: Payer: Medicare Other | Admitting: Family Medicine

## 2020-05-03 ENCOUNTER — Other Ambulatory Visit: Payer: Self-pay | Admitting: Family Medicine

## 2020-05-03 ENCOUNTER — Ambulatory Visit (INDEPENDENT_AMBULATORY_CARE_PROVIDER_SITE_OTHER): Payer: Medicare Other | Admitting: Psychiatry

## 2020-05-03 ENCOUNTER — Other Ambulatory Visit: Payer: Self-pay

## 2020-05-03 ENCOUNTER — Encounter: Payer: Self-pay | Admitting: Psychiatry

## 2020-05-03 DIAGNOSIS — F5104 Psychophysiologic insomnia: Secondary | ICD-10-CM | POA: Diagnosis not present

## 2020-05-03 DIAGNOSIS — F319 Bipolar disorder, unspecified: Secondary | ICD-10-CM | POA: Diagnosis not present

## 2020-05-03 DIAGNOSIS — F4321 Adjustment disorder with depressed mood: Secondary | ICD-10-CM | POA: Diagnosis not present

## 2020-05-03 DIAGNOSIS — R7989 Other specified abnormal findings of blood chemistry: Secondary | ICD-10-CM | POA: Diagnosis not present

## 2020-05-03 DIAGNOSIS — F411 Generalized anxiety disorder: Secondary | ICD-10-CM | POA: Diagnosis not present

## 2020-05-03 DIAGNOSIS — G3184 Mild cognitive impairment, so stated: Secondary | ICD-10-CM

## 2020-05-03 NOTE — Progress Notes (Unsigned)
cm

## 2020-05-03 NOTE — Progress Notes (Signed)
Rebecca Novak [id number] March 18, 1946 74 y.o.  Subjective:   Patient ID:  Rebecca Novak is a 74 y.o. (DOB 09-07-1945) female.  Chief Complaint:  Chief Complaint  Patient presents with  . Follow-up  . Depression  . Anxiety  . grief    Rebecca Novak died    Altered Mental Status   . Depression  . Anxiety  . Memory Loss  . Medication Problem  . Stress  . Sleeping Problem     Medication Refill Associated symptoms include arthralgias. Pertinent negatives include no fatigue or weakness.  Depression        Associated symptoms include decreased concentration.  Associated symptoms include no fatigue, no appetite change and no suicidal ideas.  Past medical history includes anxiety.   Anxiety Symptoms include decreased concentration and nervous/anxious behavior. Patient reports no confusion, dizziness or suicidal ideas.     Rebecca Novak presents to the office today for follow-up of above and bipolar disorder.    Patient called July 19, 2019 reporting that she had inadvertently stopped carbamazepine apparently as much as 6 months ago.  Given that the patient was not notably worse off that medication it was not restarted.  There also had been some question since her last appointment about her consistency with the clonazepam as well.  12/22/2019 appointment the following is noted: Since being here patient called back and has apparently been confused about whether she was taking Equetro or not as she was asking for refill and indicating that she had never stopped it.  So prescription refills were sent. "I misspoke"  Claims has been taking Equetro 400 mg HS. Continued good mood and no complaints.  Really well.  Anxiety is stable.  Weight and appetite better but diet is not good bc no veges or fruits. Plan: no med changes  05/03/20 appt with following noted: Rebecca Novak passed the day after Thanksgiving.  It's a big shock.  Has been staying with [facility] since then.  Will try to stay at  her house tonight if she can.   Rebecca Novak has a will.  Rebecca Novak has made Rebecca Novak her POA and agrees for ROI for [name].  Pt feels she can manage the estate with direction. Rebecca Novak has been supportive. Obviously sad.  Had been doing OK caring for him until he passed peacefully.  She liked caring for him.   Rebecca Novak was cremated.   Otherwise doing OK with meds and not med complaints.  Compliant.  Past Psychiatric Medication Trials: Equetro 1200, buspirone, Deplin, Latuda 120, duloxetine, Wellbutrin, Lexapro, sertraline, lithium no response, fluoxetine, Abilify 20, olanzapine, Geodon 160, gabapentin, lamotrigine 100, Depakote, trazodone, Ambien, clonazepam, temazepam, topiramate  Multiple problems. 2  hospitalizations (last 02/23/18) DT confusion and with high CBZ levels, the last was 13.9, but she was also dehydrated.   Dosage reduced now  Review of Systems:  Review of Systems  Constitutional: Negative for activity change, appetite change, fatigue and unexpected weight change.  Gastrointestinal: Negative.   Musculoskeletal: Positive for arthralgias.  Neurological: Negative for dizziness, tremors and weakness.  Psychiatric/Behavioral: Positive for decreased concentration, depression and dysphoric mood. Negative for agitation, behavioral problems, confusion, hallucinations, self-injury, sleep disturbance and suicidal ideas. The patient is nervous/anxious. The patient is not hyperactive.   Not dizzy.   Medications: I have reviewed the patient's current medications.  I  Current Outpatient Medications  Medication Sig Dispense Refill  . carbamazepine (EQUETRO) 200 MG CP12 12 hr capsule 1 in AM and 2 at night (Patient taking differently: 400 mg  at night) 30 capsule 0  . LATUDA 120 MG TABS TAKE 1 TABLET EVERY EVENINGWITH A MEAL 90 tablet 0  . levothyroxine (SYNTHROID) 100 MCG tablet Take 1 tablet (100 mcg total) by mouth daily. 90 tablet 1  . metoprolol tartrate (LOPRESSOR) 25 MG tablet Take 1 tablet (25 mg  total) by mouth daily as needed (palpitations). 90 tablet 3  . QUEtiapine (SEROQUEL) 100 MG tablet TAKE 2 TABLETS (200MG ) AT  BEDTIME 180 tablet 1  . SF 5000 PLUS 1.1 % CREA dental cream USE ONCE AT BEDTIME IN PLACE OF REGULAR TOOTHPASTE. BRUSH FOR 2 MINUTES EXPECTORATE. DO NOT RINSE    . simvastatin (ZOCOR) 40 MG tablet TAKE 1 TABLET BY MOUTH AT BEDTIME 30 tablet 5  . Vitamin D, Ergocalciferol, (DRISDOL) 1.25 MG (50000 UNIT) CAPS capsule Take 1 capsule (50,000 Units total) by mouth every 7 (seven) days. 12 capsule 0   No current facility-administered medications for this visit.    Medication Side Effects: None  Allergies: No Known Allergies  Past Medical History:  Diagnosis Date  . ANEMIA, MILD 05/17/2008  . BCC (basal cell carcinoma) 08/08/2009   right cheek (CX35FU +exc. )  . BCC (basal cell carcinoma) 03/12/2011   right sideburn   . BCC (basal cell carcinoma) 12/08/2012   right lower leg (CX35FU)  . BCC (basal cell carcinoma) 04/25/2015   left lower back  . BCC (basal cell carcinoma) 05/27/2016   left shoulder (CX35FU), left cheek (CX35FU)  . 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 COLONIC POLYPS, HX OF 05/17/2007  . Depression    bipolar  . GLUCOSE INTOLERANCE 11/23/2007  . HAND PAIN 07/31/2008  . HYPERLIPIDEMIA 02/05/2007  . HYPOTHYROIDISM 02/05/2007  . 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 SCC (squamous cell carcinoma) 11/18/2007   right lower leg (CX35FU)  . SCC (squamous cell carcinoma) 03/12/2011   left outer calf  . SCC (squamous cell carcinoma) 11/19/2011   left side of chest -tx p bx  . SCC (squamous cell carcinoma) 12/08/2012   left chest (CX35FU)  . SCC (squamous cell carcinoma) 09/28/2014   left arm inferior (CX35FU), right arm, lateral (CX35FU)  . SCC (squamous cell carcinoma) 04/25/2015   rigth forearm   . SCC (squamous cell carcinoma) 01/10/2017   right shin proximal (CX35FU)  . SCC (squamous  cell carcinoma) 04/21/2018   top right hand-tx p bx  . SYSTOLIC MURMUR 44/81/8563    Family History  Problem Relation Age of Onset  . Dementia Mother   . Healthy Father        until death at age 57  . Mental illness Brother        not been in contact in years  . Healthy Sister     Social History   Socioeconomic History  . Marital status: Married    Spouse name: [name]  . Number of children: 0  . Years of education: Not on file  . Highest education level: Not on file  Occupational History  . 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 Smoking status: Former Smoker    Packs/day: 0.33    Years: 44.00    Pack years: 14.52    Types: Cigarettes  . Smokeless tobacco: Never Used  . Tobacco comment: 02/15/2013 "stopped smoking cigarettes ~ 7 yr ago"  Vaping Use  . Vaping Use: Never used  Substance and Sexual Activity  . Alcohol use:  No  . Drug use: No  . Sexual activity: Yes  Other Topics Concern  . Not on file  Social History Narrative  . Not on file   Social Determinants of Health   Financial Resource Strain: Low Risk   . Difficulty of Paying Living Expenses: Not hard at all  Food Insecurity: No Food Insecurity  . Worried About Charity fundraiser in the Last Year: Never true  . Ran Out of Food in the Last Year: Never true  Transportation Needs: No Transportation Needs  . Lack of Transportation (Medical): No  . Lack of Transportation (Non-Medical): No  Physical Activity: Inactive  . Days of Exercise per Week: 0 days  . Minutes of Exercise per Session: 0 min  Stress: No Stress Concern Present  . Feeling of Stress : Not at all  Social Connections: Moderately Integrated  . Frequency of Communication with Friends and Family: Three times a week  . Frequency of Social Gatherings with Friends and Family: Three times a week  . Attends Religious Services: 1 to 4 times per year  . Active Member of Clubs or Organizations: No  .  Attends Archivist Meetings: Never  . Marital Status: Married  Human resources officer Violence: Not At Risk  . Fear of Current or Ex-Partner: No  . Emotionally Abused: No  . Physically Abused: No  . Sexually Abused: No    Past Medical History, Surgical history, Social history, and Family history were reviewed and updated as appropriate.   Rebecca Novak was in rehab awhile.  Back home.  Has terminal CA.  He's now dependent on her and she's always been dependent on him in the past.  She doesn't prepare meals.  Please see review of systems for further details on the patient's review from today.   Objective:   Physical Exam:  There were no vitals taken for this visit.  Physical Exam Constitutional:      General: She is not in acute distress.    Appearance: She is well-developed.  Musculoskeletal:        General: No deformity.  Neurological:     Mental Status: She is alert and oriented to person, place, and time.     Motor: No tremor.     Coordination: Coordination normal.     Gait: Gait normal.  Psychiatric:        Attention and Perception: She is attentive. She does not perceive auditory hallucinations.        Mood and Affect: Mood is depressed. Mood is not anxious. Affect is tearful. Affect is not labile, blunt, angry or inappropriate.        Speech: Speech normal. Speech is not rapid and pressured, delayed or slurred.        Behavior: Behavior normal. Behavior is not agitated, aggressive, withdrawn or hyperactive.        Thought Content: Thought content normal. Thought content is not paranoid or delusional. Thought content does not include homicidal or suicidal ideation. Thought content does not include homicidal or suicidal plan.        Cognition and Memory: Cognition is impaired. Memory is impaired.        Judgment: Judgment normal.     Comments: Fair to poor insight and fair judgment. Less easily confused.     MMSE 27/30, recall 1/3, reduced attention on 03/26/18  Lab Review:      Component Value Date/Time   NA 142 01/13/2020 1130   K 4.0 01/13/2020 1130   CL  108 01/13/2020 1130   CO2 27 01/13/2020 1130   GLUCOSE 97 01/13/2020 1130   GLUCOSE 86 05/05/2006 0904   BUN 24 01/13/2020 1130   CREATININE 1.03 (H) 01/13/2020 1130   CALCIUM 8.6 01/13/2020 1130   PROT 6.0 (L) 01/13/2020 1130   ALBUMIN 3.9 12/15/2018 1152   AST 19 01/13/2020 1130   ALT 13 01/13/2020 1130   ALKPHOS 90 12/15/2018 1152   BILITOT 0.3 01/13/2020 1130   GFRNONAA >60 02/24/2018 0547   GFRAA >60 02/24/2018 0547       Component Value Date/Time   WBC 4.4 01/13/2020 1130   RBC 4.36 01/13/2020 1130   HGB 12.2 01/13/2020 1130   HCT 37.4 01/13/2020 1130   HCT 34.9 01/18/2018 0549   PLT 196 01/13/2020 1130   MCV 85.8 01/13/2020 1130   MCH 28.0 01/13/2020 1130   MCHC 32.6 01/13/2020 1130   RDW 15.2 (H) 01/13/2020 1130   LYMPHSABS 1,751 01/13/2020 1130   MONOABS 0.5 12/15/2018 1152   EOSABS 141 01/13/2020 1130   BASOSABS 31 01/13/2020 1130    No results found for: POCLITH, LITHIUM   Lab Results  Component Value Date   CBMZ 6.1 12/15/2018    Never got labs requested in November and reminded at each appt and never got them. .res Assessment: Plan:    Rejina was seen today for follow-up, depression, anxiety and grief.  Diagnoses and all orders for this visit:  Bipolar I disorder ([place])  Grief  Generalized anxiety disorder  Mild cognitive impairment  Chronic insomnia  Low vitamin D level    Greater than 50% of 30-minute face to face time with patient was spent on counseling and coordination of care.  There is ongoing concern about whether the patient is going to be able to take care of herself without assistance.  We discussed Extensive discussion about her bipolar disorder which is under relatively good control at the moment and unusual given the degree of stress that she is experiencing.  She has had previous bouts of delirium with poor self-care and dehydration but  she has done a better job of managing this.  She claims she is compliant with medications.  She is not having adverse side effects with the medication.  Reduced but didn't stop Equetro and mood stability is consistent and tolerated. No mania nor paranoia.   Continue Equetro 400 mg HS Latuda 120 mg  Quetiapine 100 mg 2 tablets at night  Discussed potential metabolic side effects associated with atypical antipsychotics, as well as potential risk for movement side effects. Advised pt to contact office if movement side effects occur.   Discussed importance of her self-care and compliance with medication for her overall mental stability.  Supportive therapy dealing with the severity of the stress associated with [name] death after Thanksgiving .   Emphasized adequate diet and fluid intake to prevent further episodes of delirium and orthostatic hypotension.   FU 4 mos  Lynder Parents, MD, DFAPA    Please see After Visit Summary for patient specific instructions.   Future Appointments  Date Time Provider [place]  05/10/2020  1:00 PM [facility] CCM PHARMACIST [facility]    No orders of the defined types were placed in this encounter.        -------------------------------

## 2020-05-04 ENCOUNTER — Other Ambulatory Visit: Payer: Self-pay | Admitting: Family Medicine

## 2020-05-04 DIAGNOSIS — F319 Bipolar disorder, unspecified: Secondary | ICD-10-CM

## 2020-05-04 DIAGNOSIS — E039 Hypothyroidism, unspecified: Secondary | ICD-10-CM

## 2020-05-04 DIAGNOSIS — E785 Hyperlipidemia, unspecified: Secondary | ICD-10-CM

## 2020-05-08 ENCOUNTER — Other Ambulatory Visit: Payer: Self-pay | Admitting: Psychiatry

## 2020-05-08 ENCOUNTER — Telehealth: Payer: Self-pay | Admitting: Pharmacist

## 2020-05-08 ENCOUNTER — Telehealth: Payer: Self-pay | Admitting: Psychiatry

## 2020-05-08 NOTE — Telephone Encounter (Signed)
Rebecca Novak called to report that she is not sleeping.  She is still wide awake at 4am.  The queiapine is not helping and she is also taking some melatonin and that isn't helping either.  Will you please prescribe something that will help her get some sleep.  She is presently at her sisters so if you need to call her call [phone].  Send prescription to [facility] on [address].

## 2020-05-09 ENCOUNTER — Other Ambulatory Visit: Payer: Self-pay | Admitting: Psychiatry

## 2020-05-09 ENCOUNTER — Telehealth: Payer: Self-pay | Admitting: *Deleted

## 2020-05-09 ENCOUNTER — Other Ambulatory Visit: Payer: Self-pay | Admitting: *Deleted

## 2020-05-09 DIAGNOSIS — E039 Hypothyroidism, unspecified: Secondary | ICD-10-CM

## 2020-05-09 DIAGNOSIS — F319 Bipolar disorder, unspecified: Secondary | ICD-10-CM

## 2020-05-09 DIAGNOSIS — I951 Orthostatic hypotension: Secondary | ICD-10-CM

## 2020-05-09 DIAGNOSIS — M19011 Primary osteoarthritis, right shoulder: Secondary | ICD-10-CM

## 2020-05-09 DIAGNOSIS — E785 Hyperlipidemia, unspecified: Secondary | ICD-10-CM

## 2020-05-09 DIAGNOSIS — D649 Anemia, unspecified: Secondary | ICD-10-CM

## 2020-05-09 MED ORDER — QUETIAPINE FUMARATE 300 MG PO TABS
300.0000 mg | ORAL_TABLET | Freq: Every day | ORAL | 0 refills | Status: DC
Start: 2020-05-09 — End: 2020-05-10

## 2020-05-09 NOTE — Telephone Encounter (Signed)
-----   Message from Viona Gilmore, [facility] sent at 05/09/2020 10:52 AM EST ----- Regarding: CCM referral Hi,  I requested for Dr. Ethlyn Gallery to submit a referral for Ms. Rebecca Novak but it looks like she selected the wrong location so it won't let me assign it. Do you mind putting in a new CCM referral for her? I would ask her but she is out this week.  Thank you, Maddie

## 2020-05-09 NOTE — Chronic Care Management (AMB) (Signed)
I made three attempts to call the patient for initial questions. There was no voicemail to leave a message. She has a upcoming appointment on 05-10-20 @ 1:00 with the clinical pharmacist.     Neita Goodnight) Mare Ferrari, [place] Assistant [phone]

## 2020-05-09 NOTE — Patient Outreach (Signed)
[facility]) Care Management  05/09/2020  Rebecca Novak Jun 25, 1945 [id number]  Telephone Screen-Unsuccessful MD referral received 05/09/2020  RN attempted the initial outreach call today however unsuccessful. No voice mail to leave a HIPAA voice message. Note spouse has the same number listed as the pt.  Will further engage upon contact with the pt. Will also send outreach letter accordingly and follow up over the next week.  Raina Mina, RN Care Management Coordinator [place] Office [phone]

## 2020-05-09 NOTE — Telephone Encounter (Signed)
Pt left another message about not being able to sleep now for 3 to 4 days. Please send script to the [facility] on [address] and call her at [phone]

## 2020-05-09 NOTE — Telephone Encounter (Signed)
Sent RX increase quetiapine 300 mg HS.  Pt in grief and meds will not fix that

## 2020-05-10 ENCOUNTER — Other Ambulatory Visit: Payer: Self-pay | Admitting: Psychiatry

## 2020-05-10 ENCOUNTER — Ambulatory Visit: Payer: Medicare Other

## 2020-05-10 DIAGNOSIS — F5104 Psychophysiologic insomnia: Secondary | ICD-10-CM

## 2020-05-10 DIAGNOSIS — F319 Bipolar disorder, unspecified: Secondary | ICD-10-CM

## 2020-05-10 MED ORDER — QUETIAPINE FUMARATE 300 MG PO TABS: 300.0000 mg | ORAL_TABLET | Freq: Every day | ORAL | 0 refills | Status: DC

## 2020-05-10 NOTE — Chronic Care Management (AMB) (Deleted)
Chronic Care Management Pharmacy  Name: Rebecca Novak  MRN: [id number] DOB: July 16, 1945  Initial Planning Appointment: ***  Initial Questions: 1. Have you seen any other providers since your last visit? n/a 2. Any changes in your medicines or health? No   Chief Complaint/ HPI  Rebecca Novak,  74 y.o. , female presents for their Initial CCM visit with the clinical pharmacist In office.  PCP : Caren Macadam, MD  Their chronic conditions include: {CHL AMB CHRONIC MEDICAL [phone]}  Office Visits: -04/11/20 Charlott Rakes, LPN: Patient presented for medicare annual wellness visit.   -02/01/20 Carolann Littler, MD: Patient presented for bilateral impacted cerumen. Irrigation of both ears was performed.  -01/06/20 Micheline Rough, MD: Patient presented for video visit for chronic conditions. Patient had not been taking levothyroxine for a couple of months.  Consult Visit: -05/03/20 Lynder Parents, MD (behavioral health): Patient presented for depression, anxiety and bipolar follow up. No medication changes made. Follow up in 4 months.  Medications: Outpatient Encounter Medications as of 05/10/2020  Medication Sig  . carbamazepine (EQUETRO) 200 MG CP12 12 hr capsule 1 in AM and 2 at night (Patient taking differently: 400 mg at night)  . LATUDA 120 MG TABS TAKE 1 TABLET EVERY EVENINGWITH A MEAL  . levothyroxine (SYNTHROID) 100 MCG tablet Take 1 tablet (100 mcg total) by mouth daily.  . metoprolol tartrate (LOPRESSOR) 25 MG tablet Take 1 tablet (25 mg total) by mouth daily as needed (palpitations).  . QUEtiapine (SEROQUEL) 300 MG tablet Take 1 tablet (300 mg total) by mouth at bedtime. TAKE 2 TABLETS (200MG) AT  BEDTIME  . SF 5000 PLUS 1.1 % CREA dental cream USE ONCE AT BEDTIME IN PLACE OF REGULAR TOOTHPASTE. BRUSH FOR 2 MINUTES EXPECTORATE. DO NOT RINSE  . simvastatin (ZOCOR) 40 MG tablet TAKE 1 TABLET BY MOUTH AT BEDTIME  . Vitamin D, Ergocalciferol,  (DRISDOL) 1.25 MG (50000 UNIT) CAPS capsule Take 1 capsule (50,000 Units total) by mouth every 7 (seven) days.  . [DISCONTINUED] Calcium Carbonate (CALCIUM 500 PO) Take 2 tablets by mouth daily.     No facility-administered encounter medications on file as of 05/10/2020.     Current Diagnosis/Assessment:  Goals Addressed   None     Bipolar disorder   Patient has failed these meds in past: *** Patient is currently {CHL Controlled/[phone]} on the following medications:  . Carbamazepine 400 mg 1 capsule at bedtime . Latuda 120 mg 1 tablet every evening with a meal . Quetiapine 100 mg 2 tablets at bedtime  We discussed:  ***  Plan Managed by Lynder Parents. Continue {CHL HP Upstream Pharmacy Plans:[phone]}  Hyperlipidemia   LDL goal < 100  Last lipids Lab Results  Component Value Date   CHOL 214 (H) 01/13/2020   HDL 58 01/13/2020   LDLCALC 136 (H) 01/13/2020   LDLDIRECT 122.7 12/18/2010   TRIG 95 01/13/2020   CHOLHDL 3.7 01/13/2020   Hepatic Function Latest Ref Rng & Units 01/13/2020 11/15/2019 12/15/2018  Total Protein 6.1 - 8.1 g/dL 6.0(L) 6.4 6.1  Albumin 3.5 - 5.2 g/dL - - 3.9  AST 10 - 35 U/L '19 19 23  ' ALT 6 - 29 U/L '13 15 15  ' Alk Phosphatase 39 - 117 U/L - - 90  Total Bilirubin 0.2 - 1.2 mg/dL 0.3 0.3 0.2  Bilirubin, Direct 0.0 - 0.2 mg/dL - - -     The ASCVD Risk score Mikey Bussing DC Jr., et al., 2013) failed to calculate for the  following reasons:   The patient has a prior MI or stroke diagnosis   Patient has failed these meds in past: *** Patient is currently {CHL Controlled/Uncontrolled:[phone]} on the following medications:  . Simvastatin 40 mg 1 tablet at bedtime  We discussed:  {CHL HP Upstream Pharmacy discussion:[phone]}  Plan Increase to high intensity statin? Continue {CHL HP Upstream Pharmacy Plans:[phone]}  Hypothyroidism   Lab Results  Component Value Date/Time   TSH 6.75 (H) 01/13/2020 11:30 AM   TSH 6.26 (H)  11/15/2019 03:23 PM   FREET4 0.7 (L) 06/26/2017 04:10 PM    Patient has failed these meds in past: *** Patient is currently uncontrolled on the following medications:  . Levothyroxine 100 mcg 1 tablet daily  We discussed:  consistent administration on an empty stomach separated from other medications with water  Plan Repeat TSH? Needs recheck (3 months after 01/06/20) Continue {CHL HP Upstream Pharmacy Plans:[phone]}  Diabetes   A1c goal {A1c goals:23924}  Recent Relevant Labs: Lab Results  Component Value Date/Time   HGBA1C 5.7 02/02/2015 10:27 AM   HGBA1C 5.8 (H) 02/14/2013 06:29 PM   GFR 51.65 (L) 12/15/2018 11:52 AM   GFR 50.01 (L) 01/05/2018 04:56 PM   MICROALBUR <0.7 02/02/2015 10:27 AM    Last diabetic Eye exam: No results found for: HMDIABEYEEXA  Last diabetic Foot exam: No results found for: HMDIABFOOTEX   Checking BG: {CHL HP Blood Glucose Monitoring [phone]}  Recent FBG Readings: *** Recent pre-meal BG readings: *** Recent 2hr PP BG readings:  *** Recent HS BG readings: ***  Patient has failed these meds in past: *** Patient is currently {CHL Controlled/Uncontrolled:[phone]} on the following medications: . ***  We discussed: {CHL HP Upstream Pharmacy discussion:[phone]}  Plan Repeat A1C Continue {CHL HP Upstream Pharmacy Plans:[phone]}   Vitamin D deficiency   Last vitamin D Lab Results  Component Value Date   VD25OH 13 (L) 01/13/2020    Patient has failed these meds in past: *** Patient is currently {CHL Controlled/Uncontrolled:[phone]} on the following medications:  Marland Kitchen Vitamin D 50,000 units 1 capsule once weekly . Should be taking 1-2,000 units daily now?  We discussed:  ***  Plan  Continue {CHL HP Upstream Pharmacy Plans:[phone]}   Palpitations   Patient has failed these meds in past: *** Patient is currently {CHL Controlled/Uncontrolled:[phone]} on the following medications:  Marland Kitchen Metoprolol tartrate  25 mg 1 tablet as needed  We discussed:  ***  Plan  Continue {CHL HP Upstream Pharmacy ZOXWR:[id number]}   ***   Patient has failed these meds in past: *** Patient is currently {CHL Controlled/Uncontrolled:[phone]} on the following medications:  . SF 5000 plus 1.1% dental cream  We discussed:  ***  Plan  Continue {CHL HP Upstream Pharmacy BJYNW:[id number]}   Vaccines   Reviewed and discussed patient's vaccination history.    Immunization History  Administered Date(s) Administered  . Fluad Quad(high Dose 65+) 03/23/2019  . Influenza Split 03/06/2011, 03/16/2012  . Influenza Whole 03/05/2007, 03/07/2008, 02/22/2009, 02/14/2010  . Influenza, High Dose Seasonal PF 03/06/2015, 03/17/2016, 02/26/2017, 03/04/2018  . Influenza,inj,Quad PF,6+ Mos 03/18/2013, 03/23/2014  . Influenza,inj,quad, With Preservative 02/23/2018  . Influenza-Unspecified 03/05/2020  . PFIZER SARS-COV-2 Vaccination 07/17/2019, 08/10/2019  . Pneumococcal Conjugate-13 02/13/2014  . Pneumococcal Polysaccharide-23 12/18/2010  . Td 11/08/2008  . Tdap 01/04/2012  . Zoster 05/16/2015   Shingrix, booster? Influenza?  Plan  Recommended patient receive *** vaccine in *** office.   Medication Management   Patient's preferred pharmacy is:  [facility] [address]  [place] - [address] [place] [place] [address] Phone: [phone] Fax: [phone]  [facility] [address] [place] to [address]istered [place] [address] Phone: [phone] Fax: [phone]  Uses pill box? {Yes or If no, why not?:20788} Pt endorses ***% compliance  We discussed: {Pharmacy options:24294}  Plan  {US Pharmacy GYIR:48546}    Follow up: *** month phone visit  Jeni Salles, PharmD [place] Pharmacist [place] at [facility] [phone]

## 2020-05-15 ENCOUNTER — Other Ambulatory Visit: Payer: Self-pay | Admitting: *Deleted

## 2020-05-15 NOTE — Patient Outreach (Signed)
[facility]) Care Management  05/15/2020  Rebecca Novak Mar 07, 1946 [id number]   Telephone Assessment MD referral 05/09/2020 Outreach #2-Unsccessful  RN spoke with pt today and introduced [facility] services and purpose for today's call. Explained the purpose for today's call as pt declined services. RN offered to follow up next month once pt thanks about the benefits of enrollment. Pt reluctant but agreed to a follow up call next month for possible enrollment.   Will call back next week for pending enrollment and further inquire on pt's possblie needs. Note pt involved with Behavior Health.   Raina Mina, RN Care Management Coordinator [place] Office [phone]

## 2020-05-16 ENCOUNTER — Telehealth: Payer: Medicare Other | Admitting: Psychiatry

## 2020-05-22 ENCOUNTER — Other Ambulatory Visit: Payer: Self-pay | Admitting: *Deleted

## 2020-05-22 NOTE — Patient Outreach (Signed)
[facility]) Care Management  05/22/2020  Rebecca Novak 1945/07/09 [id number]   Telephone Assessment-Unsuccessful  RN attempted outreach call as requested for a call back this week. Call was unsuccessful and unable to leave a voice message.   Will resend outreach letter and attempt once again another outreach call in a few weeks.  Elliot Cousin, RN Care Management Coordinator [facility] Main Office [phone]

## 2020-05-24 ENCOUNTER — Other Ambulatory Visit: Payer: Self-pay | Admitting: Psychiatry

## 2020-05-24 MED ORDER — CARBAMAZEPINE ER 200 MG PO CP12: ORAL_CAPSULE | ORAL | 0 refills | Status: DC

## 2020-05-25 ENCOUNTER — Other Ambulatory Visit: Payer: Self-pay | Admitting: Psychiatry

## 2020-06-11 ENCOUNTER — Other Ambulatory Visit: Payer: Self-pay | Admitting: *Deleted

## 2020-06-11 NOTE — Patient Outreach (Signed)
[facility]) Care Management  06/11/2020  Rebecca Novak 19-Sep-1945 [id number]  Telephone Assessment-Successful-Decline  RN spoke with pt today and reiterated on the available services with [facility]. Explained the purpose for today's call as pt receptive to the information provided but opt to decline [facility] services at this time indicating she is managing all her medical condition with no acute needs or issues at this time. Also offered quarterly however pt again declined to offer for [facility] services however appreciative.  Will notify pt's provider and close case from further calls at this time with no acute needs. Pt aware how to reach [facility] if services are needed in the future.  Raina Mina, RN Care Management Coordinator [place] Office [phone]

## 2020-06-12 ENCOUNTER — Other Ambulatory Visit: Payer: Self-pay | Admitting: Psychiatry

## 2020-06-12 MED ORDER — LATUDA 120 MG PO TABS: 120.0000 mg | ORAL_TABLET | Freq: Every day | ORAL | 0 refills | Status: DC

## 2020-06-13 ENCOUNTER — Telehealth: Payer: Self-pay | Admitting: Psychiatry

## 2020-06-13 ENCOUNTER — Other Ambulatory Visit: Payer: Self-pay | Admitting: Psychiatry

## 2020-06-13 MED ORDER — LATUDA 120 MG PO TABS: 120.0000 mg | ORAL_TABLET | Freq: Every day | ORAL | 0 refills | Status: DC

## 2020-06-13 NOTE — Telephone Encounter (Signed)
Rebecca Novak called to say that she is missing one of her important pills she takes. She said it is a yellow pill with 261 on it. She needs a refill on this. Do you have any idea what it is? I went through all her pills and she said they are not the one she is talking about. If you can help me that would be great. Thanks

## 2020-06-13 NOTE — Telephone Encounter (Signed)
Rebecca Novak called back and requested a call about the missing medication.  She seems a bit confused about her medication.  Please call her.

## 2020-06-13 NOTE — Telephone Encounter (Signed)
Will try to contact her sister [name] who is assisting her with her medication.

## 2020-06-13 NOTE — Telephone Encounter (Signed)
Tried to reach patient but no answer and no voicemail. Tried to reach patient's sister [name], I did leave a message to call me back.

## 2020-06-13 NOTE — Telephone Encounter (Signed)
Sent Latuda prescription 120 mg tablets 1 daily #32 the [facility] in case she needs it.  She can pick up some Latuda samples to last a week or so if needed while she waits on her mail order as an alternative.  Equetro comes in 100, 200, and 300 mg sizes.  Her prescription has been for 200 mg size.  At her last visit she indicated she was taking 2 of those at night which would be a total of 400 mg nightly.  There is no 400 mg size available.  Apparently she is taking 2 of the 200 mg capsules now and that is what she said at the last visit.  She has gotten confused on this in the past and been hospitalized for toxicity.  So it is best not to change her dose if she is stable.  Since her sleep is better and no mania is being noted.  We will make no med changes at this time.

## 2020-06-13 NOTE — Telephone Encounter (Signed)
After a lengthy discussion with patient's sister Rebecca Novak she reports patient had not been taking her Equetro (cabamazepine) and had a bottle from 2019. After speaking with Rebecca Novak I believe the confusion was with patient having an old bottle of Carbamazepine and a newer bottle of Equetro. Patient and sister didn't understand that is the same medication and she should be on the [facility]. Advised [place] to toss the old bottle of Carbamazepine as to not get it mixed up with current medications. Patient also reports running out of Latuda 120 mg about 1 week ago. A refill Rx has been sent to [facility] but Rebecca Novak reports it takes awhile. Asking it to be sent to [facility] on [address]. I informed her I would let her know if we had samples available as well. Patient reports she has been taking 2 of the 400 mg Equetro at hs.  Will update Rebecca Novak if patient needs labs or any changes with her dosing of medication. Patient reports sleep is better, takes awhile for her to get to sleep but once she is asleep she doesn't wake up until morning.   Patient currently lives alone for 1 week, then goes to stay 1 week at her sister Rebecca Novak's house.   Informed patient will follow up with her tomorrow afternoon

## 2020-06-13 NOTE — Telephone Encounter (Signed)
Rebecca Novak called again. This time to report that she does not have the carbamazepine.  Looks like it was sent to [facility] mail service 05/24/20 but she said she doesn't have it so hasn't been taking it.  Please check on the status or reorder.

## 2020-06-14 ENCOUNTER — Other Ambulatory Visit: Payer: Self-pay

## 2020-06-14 ENCOUNTER — Telehealth: Payer: Self-pay | Admitting: Family Medicine

## 2020-06-14 MED ORDER — LEVOTHYROXINE SODIUM 100 MCG PO TABS: 100.0000 ug | ORAL_TABLET | Freq: Every day | ORAL | 0 refills | Status: DC

## 2020-06-14 NOTE — Telephone Encounter (Signed)
Discussed medication again with patient and she verbalized understanding. Still having confusion at times. She will pick up samples tomorrow. They are ready in closet.

## 2020-06-14 NOTE — Telephone Encounter (Signed)
Patient is calling and requesting a refill for levothyroxine (SYNTHROID) 100 MCG tablet and metoprolol tartrate (LOPRESSOR) 25 MG tablet to be sent to  [facility]  [address] [place] [address]  Phone:  [phone] Fax:  [phone] CB is [phone]

## 2020-06-15 NOTE — Telephone Encounter (Signed)
[facility] is calling in stating that they no longer have the brand Sandoz for Rx Levothyroxine (SYNTHROID) 100 MCG and would like to see if it is okay to refill with brand Alvogen.  [facility] would like to have a call back.

## 2020-06-18 NOTE — Telephone Encounter (Signed)
Spoke with the pt and she agreed to changing the brand.  Joe the pharmacist at [facility] was informed of the approval per PCP.

## 2020-06-18 NOTE — Telephone Encounter (Signed)
Yes this is ok. Just make patient aware that they have to do brand switch so med will look differently. Most people do fine with this switch, but let me know if concerns.

## 2020-06-24 IMAGING — CT CT CERVICAL SPINE W/O CM
4 of 6 series · 15 of 33 positions shown, 17 images · non-contrast
Comparison: CT head 02/14/2013

CLINICAL DATA: Dizziness, fall.

EXAM:
CT HEAD WITHOUT CONTRAST
CT CERVICAL SPINE WITHOUT CONTRAST
TECHNIQUE: Multidetector CT imaging of the head and cervical spine was
performed following the standard protocol without intravenous
contrast. Multiplanar CT image reconstructions of the cervical spine
were also generated.

[Series 2: head wo · axial · 0.47mm/px · z∈[-383,-328]mm · 2 of 33 slices shown]
[im 11/33  bone]
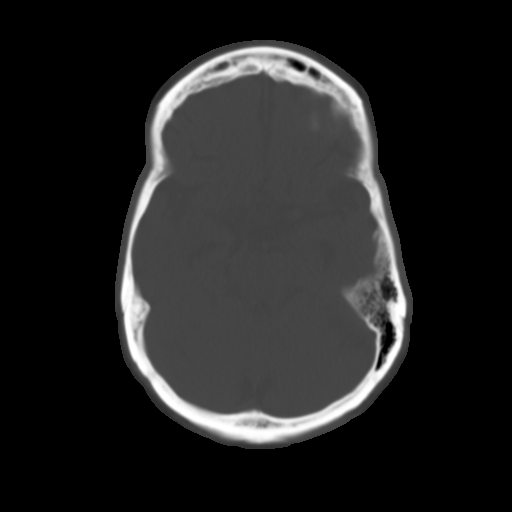
[im 22/33  bone]
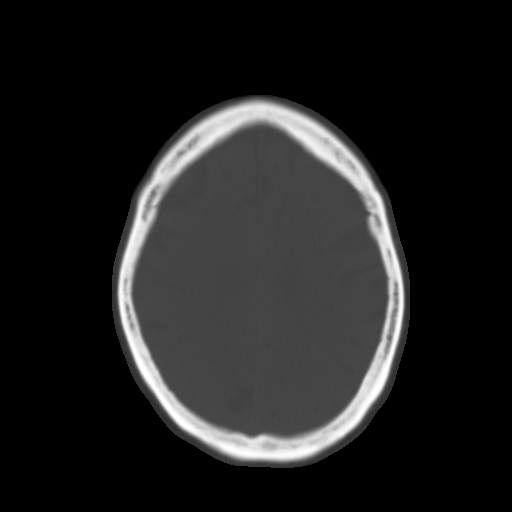

[Series 10: coronal bone · coronal · 0.26mm/px · 1 of 61 slices shown]
[im 31/61  bone]
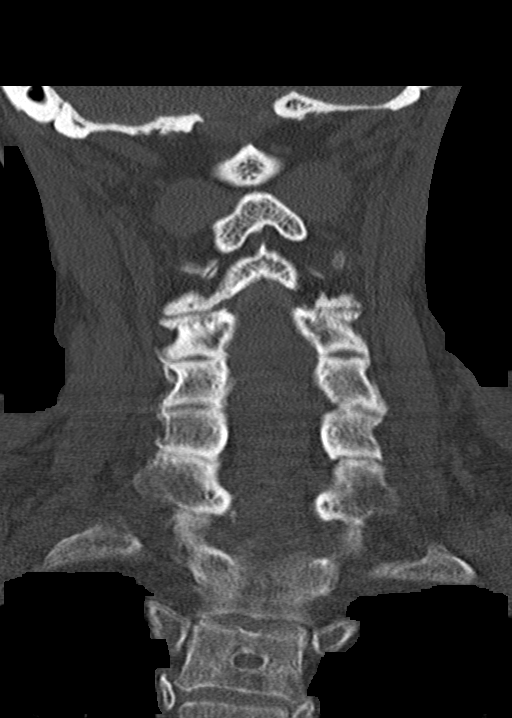

[Series 11: sagittal bone · sagittal · 0.35mm/px · 4 of 61 slices shown]
[im 13/61  bone]
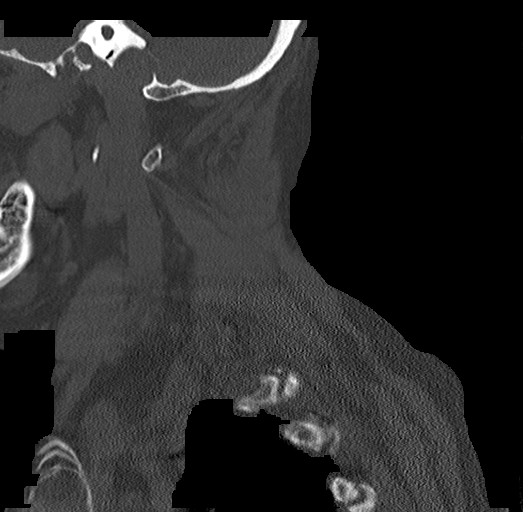
[im 25/61  bone]
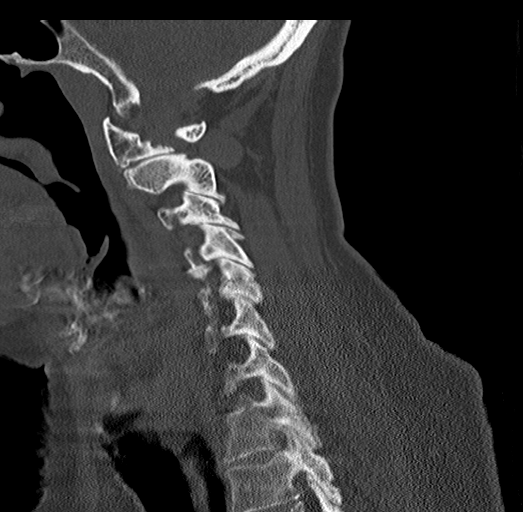
[im 37/61  bone]
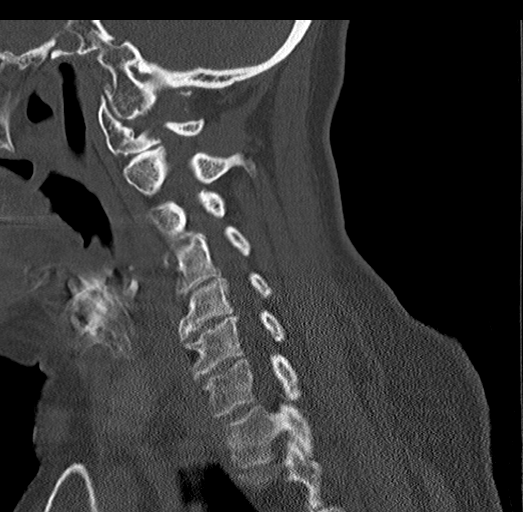
[im 49/61  bone]
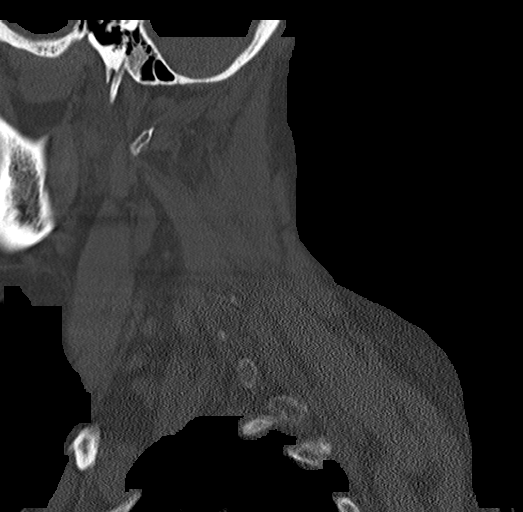

[Series 13: c spine soft · axial · 0.31mm/px · z∈[-578,-438]mm · 8 of 90 slices shown, 10 images]
[im 10/90  soft-tissue]
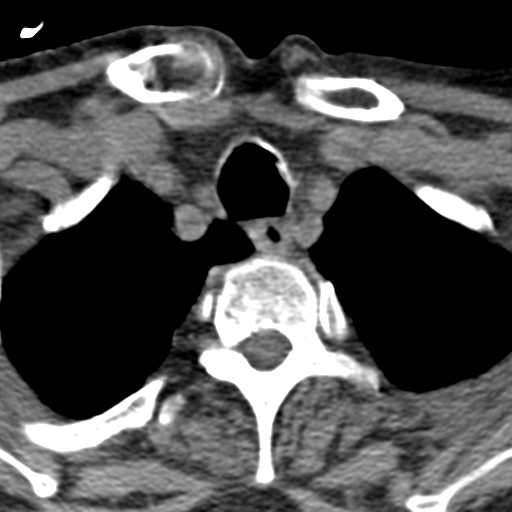
[im 10/90  bone]
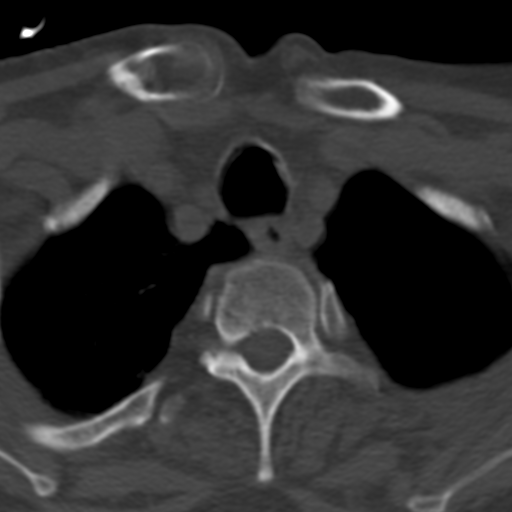
[im 20/90  bone]
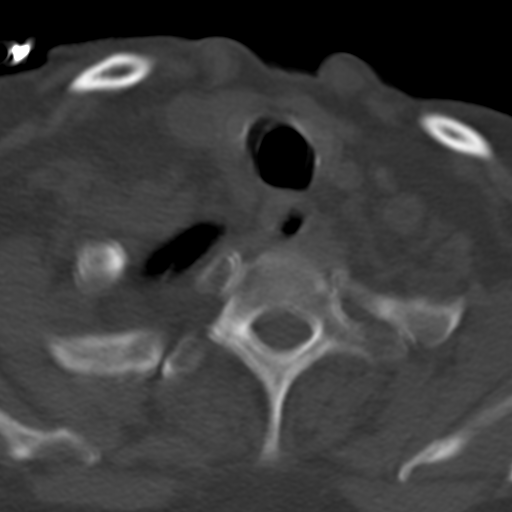
[im 30/90  bone]
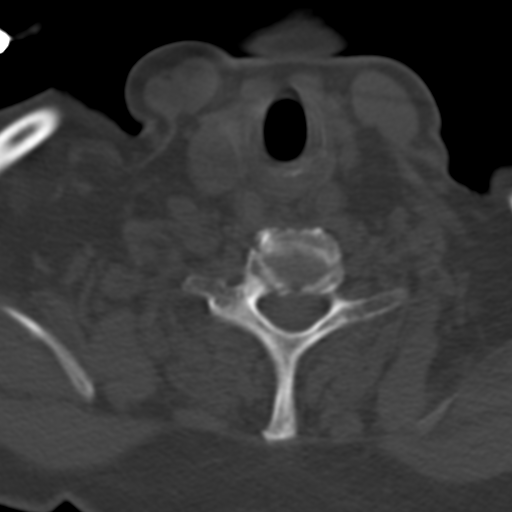
[im 40/90  bone]
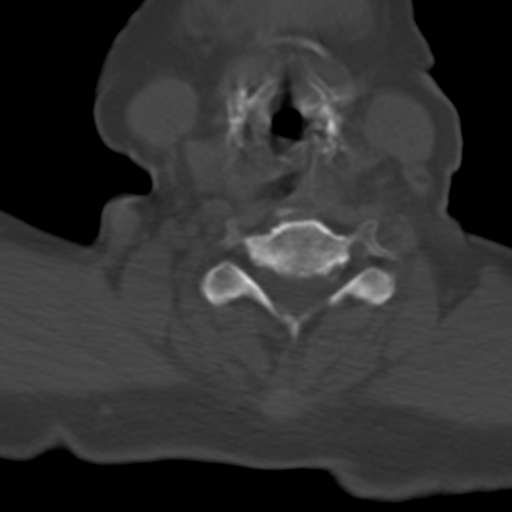
[im 50/90  soft-tissue]
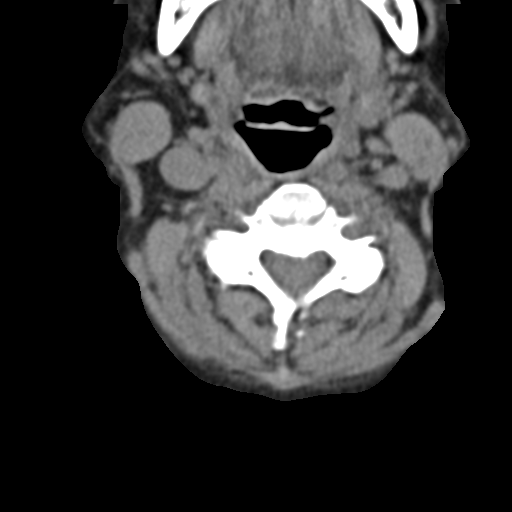
[im 50/90  bone]
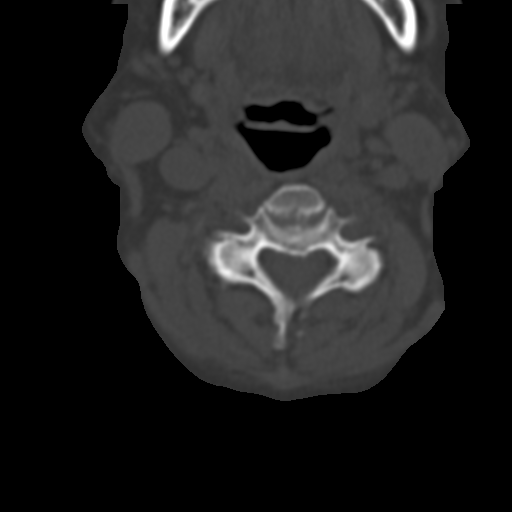
[im 60/90  bone]
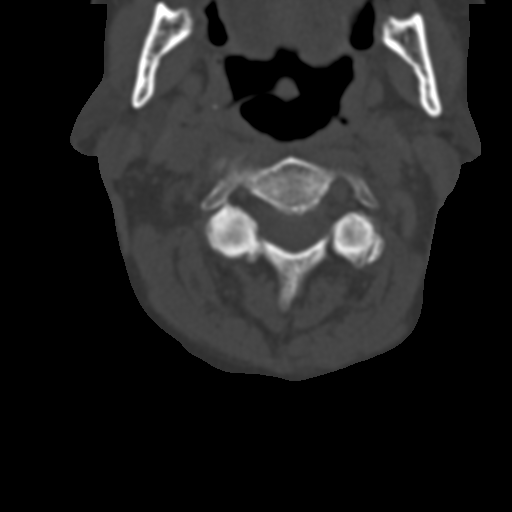
[im 70/90  bone]
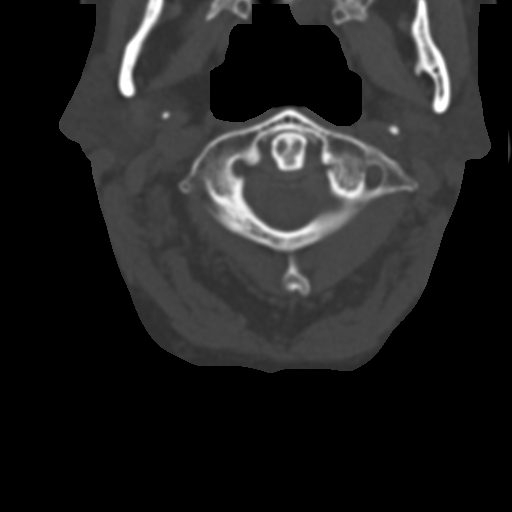
[im 80/90  bone]
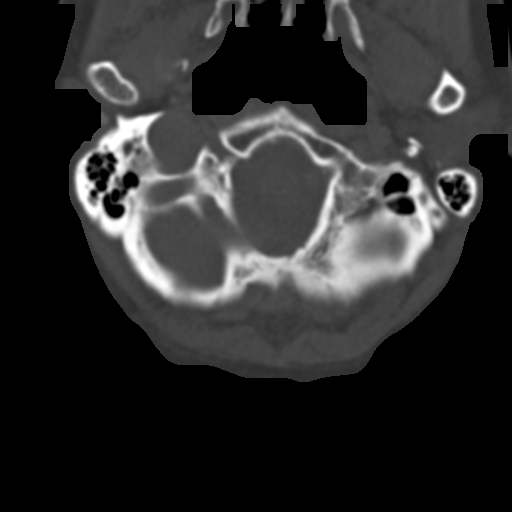

[15 of 33 positions shown; findings below may reference images not displayed]

FINDINGS: CT HEAD FINDINGS

Brain: There is atrophy and chronic small vessel disease changes. No
acute intracranial abnormality. Specifically, no hemorrhage,
hydrocephalus, mass lesion, acute infarction, or significant
intracranial injury.

Vascular: No hyperdense vessel or unexpected calcification.

Skull: No acute calvarial abnormality.

Sinuses/Orbits: Visualized paranasal sinuses and mastoids clear.
Orbital soft tissues unremarkable.

Other: None

CT CERVICAL SPINE FINDINGS

Alignment: No subluxation.  Loss of cervical lordosis.

Skull base and vertebrae: No acute fracture. No primary bone lesion
or focal pathologic process.

Soft tissues and spinal canal: No prevertebral fluid or swelling. No
visible canal hematoma.

Disc levels: Diffuse degenerative disc disease and facet disease
bilaterally.

Upper chest: No acute findings

Other: No acute findings
IMPRESSION: No acute intracranial abnormality.

Atrophy, chronic microvascular disease.

Mild degenerative changes diffusely throughout the cervical spine.
No acute bony abnormality. Loss of cervical lordosis.

## 2020-06-24 IMAGING — CR DG CHEST 2V
2 series · 2 of 2 positions shown · non-contrast
Comparison: 12/12/2017

CLINICAL DATA: Fall

EXAM:
CHEST - 2 VIEW

[w chest lat]
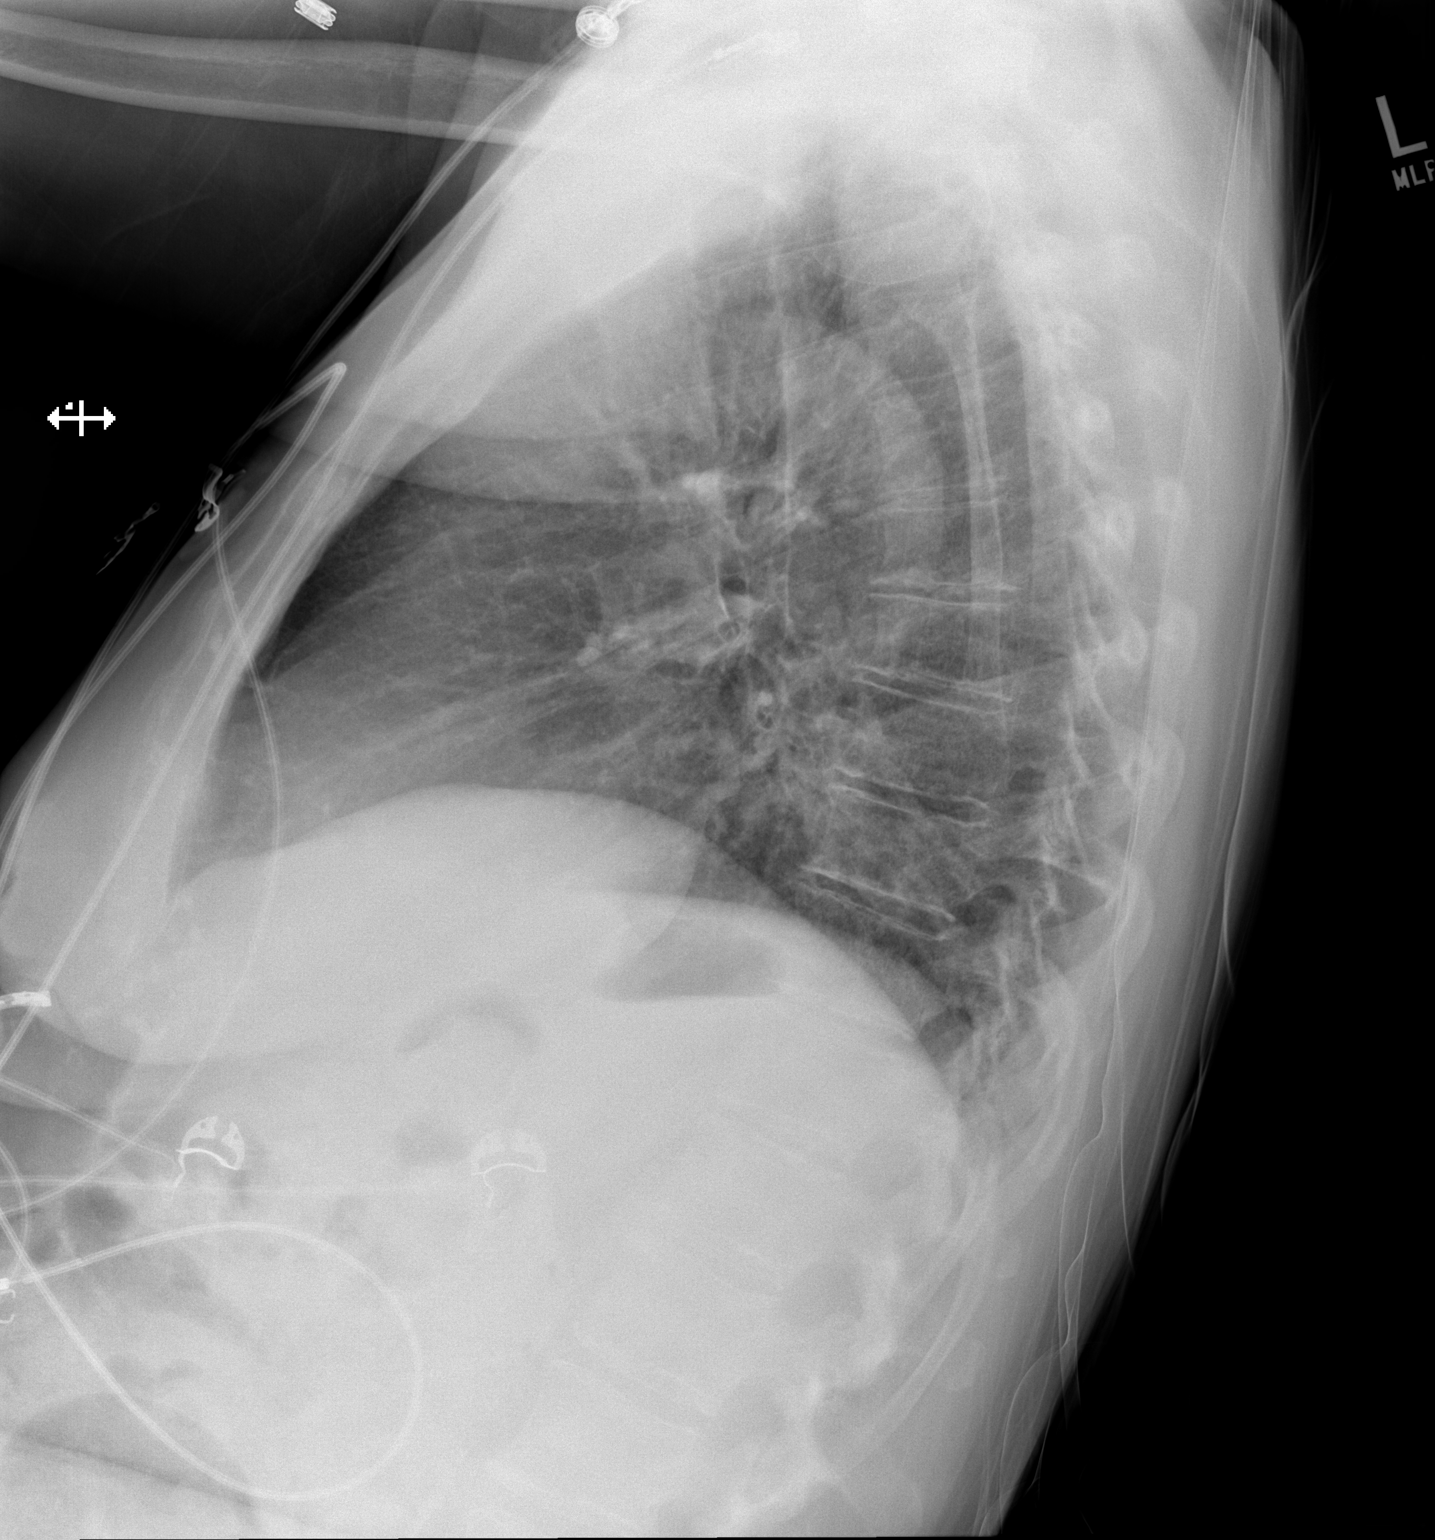

[x chest ap]
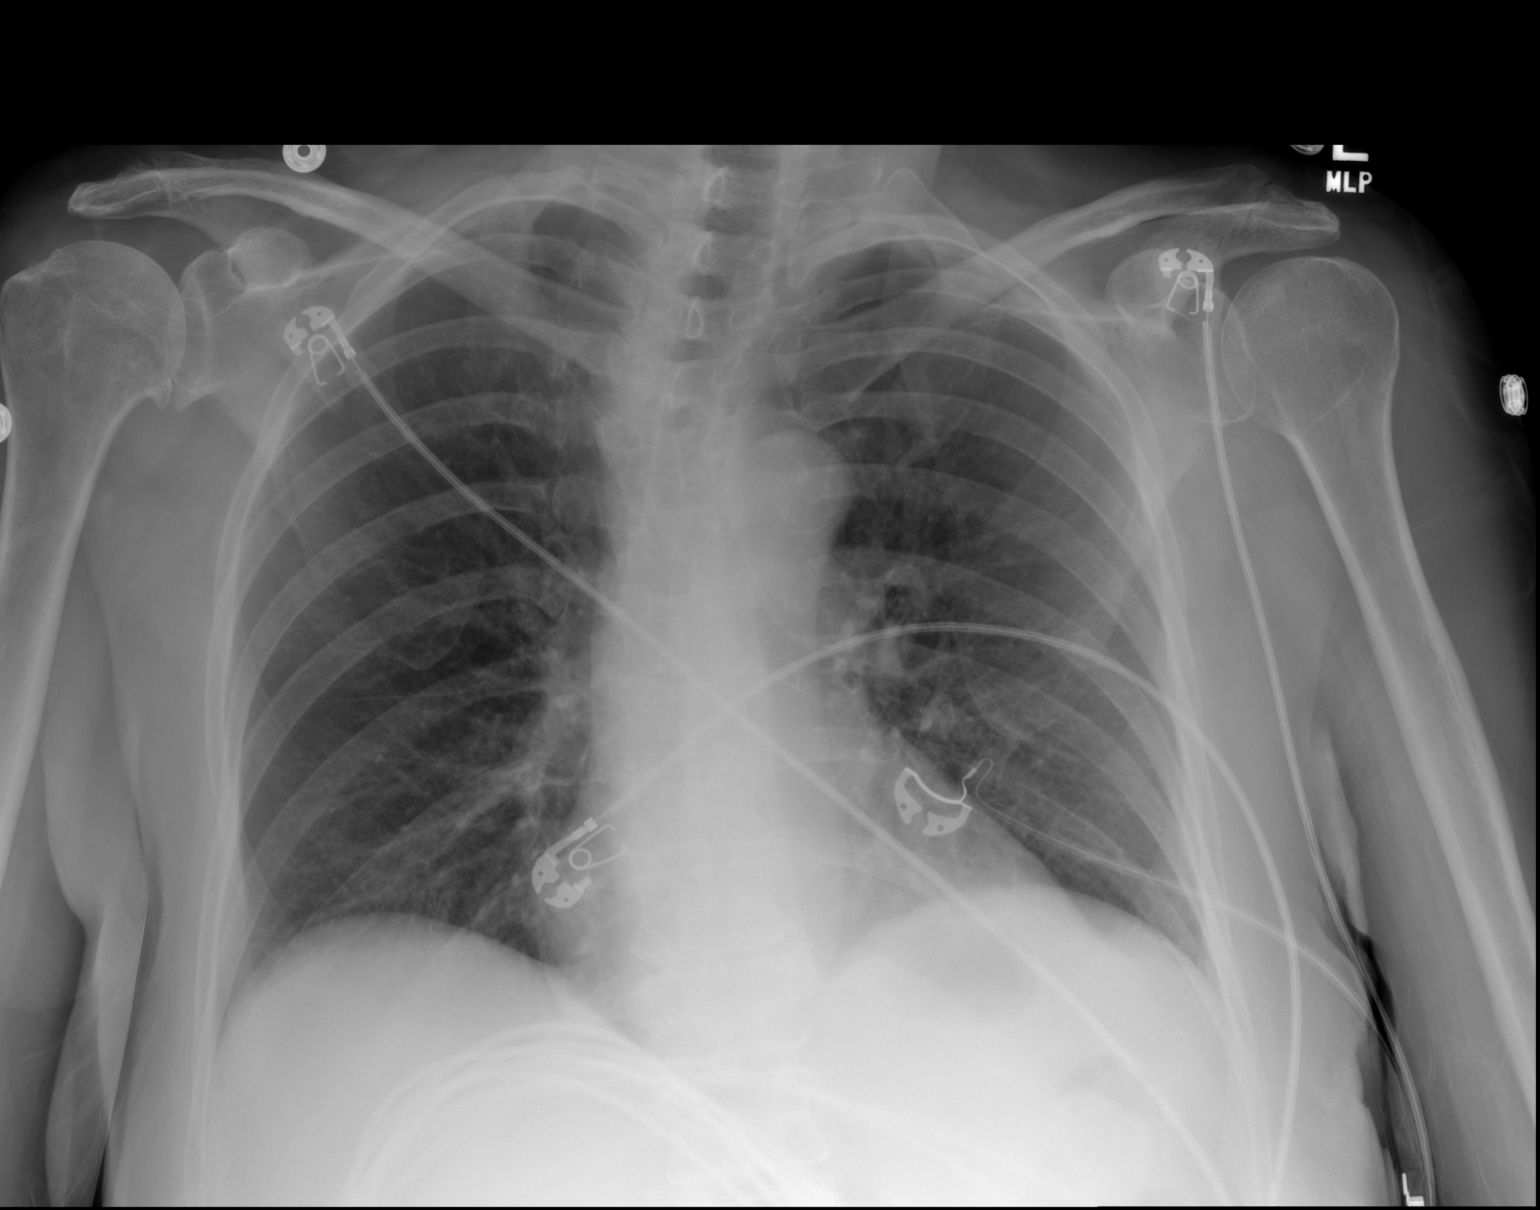

[2 of 2 positions shown; findings below may reference images not displayed]

FINDINGS: Heart is normal size. No confluent airspace opacities or effusions.
No acute bony abnormality or pneumothorax.
IMPRESSION: No acute cardiopulmonary disease.

## 2020-07-10 ENCOUNTER — Telehealth: Payer: Self-pay | Admitting: Psychiatry

## 2020-07-10 NOTE — Telephone Encounter (Signed)
Pt called and said that she would like to talk to traci about her medications. She sounds very confused about what to take and the directions. Please call her at [phone]

## 2020-07-10 NOTE — Telephone Encounter (Signed)
Rtc to patient and we discussed her medication, she reports when she takes everything at bedtime between 11-11:15 pm she feels "funny". Advised her to take her simvastatin with dinner, take her carbamazepine at 10:00 pm then Quetiapine at hs. She agreed to change it up and call us back if not helping.

## 2020-08-07 ENCOUNTER — Telehealth: Payer: Self-pay | Admitting: Psychiatry

## 2020-08-07 NOTE — Telephone Encounter (Signed)
Rebecca Novak would like a referrall for a counselor who specializes in grief outside of the practice.Please call her at [phone]

## 2020-08-07 NOTE — Telephone Encounter (Signed)
Pt aware.

## 2020-08-07 NOTE — Telephone Encounter (Signed)
I usually just refer to hospice because that is what they do.  I do not know any other specific person.

## 2020-08-07 NOTE — Telephone Encounter (Signed)
Do you have anyone in particular to recommend?

## 2020-08-14 ENCOUNTER — Other Ambulatory Visit: Payer: Self-pay | Admitting: Psychiatry

## 2020-08-15 NOTE — Telephone Encounter (Signed)
Please review,previous note says "quetiapine 100 mg 2 tabs" but this is for 300 mg

## 2020-08-15 NOTE — Telephone Encounter (Signed)
Rebecca Novak, I believe she should be on 300 mg at hs of seroquel but CC can review to make sure.

## 2020-08-15 NOTE — Telephone Encounter (Signed)
Pt called request Seroquel 300 mg @ [facility]. Apt 4/6

## 2020-08-15 NOTE — Telephone Encounter (Signed)
Pt called [facility] again today stating that she needs this refill of the quetiapine 300 mg to be sent in today. She is very low on her medicine and she doesn't want to run out

## 2020-08-16 NOTE — Telephone Encounter (Signed)
May 08, 2020 patient called complaining of not sleeping and asking to increase Seroquel from 200 mg back to 300 mg nightly.  Prescription was sent for that dosage.  We will renew this request for 300 mg nightly.  Prescription sent

## 2020-08-28 ENCOUNTER — Other Ambulatory Visit: Payer: Self-pay | Admitting: Family Medicine

## 2020-08-29 ENCOUNTER — Ambulatory Visit (INDEPENDENT_AMBULATORY_CARE_PROVIDER_SITE_OTHER): Payer: Medicare Other | Admitting: Psychiatry

## 2020-08-29 ENCOUNTER — Other Ambulatory Visit: Payer: Self-pay

## 2020-08-29 ENCOUNTER — Encounter: Payer: Self-pay | Admitting: Psychiatry

## 2020-08-29 DIAGNOSIS — F319 Bipolar disorder, unspecified: Secondary | ICD-10-CM

## 2020-08-29 DIAGNOSIS — G3184 Mild cognitive impairment, so stated: Secondary | ICD-10-CM

## 2020-08-29 DIAGNOSIS — F411 Generalized anxiety disorder: Secondary | ICD-10-CM

## 2020-08-29 DIAGNOSIS — F5104 Psychophysiologic insomnia: Secondary | ICD-10-CM

## 2020-08-29 DIAGNOSIS — F4321 Adjustment disorder with depressed mood: Secondary | ICD-10-CM

## 2020-08-29 MED ORDER — CARBAMAZEPINE ER 200 MG PO CP12: 400.0000 mg | ORAL_CAPSULE | Freq: Every evening | ORAL | 0 refills | Status: DC

## 2020-08-29 NOTE — Progress Notes (Signed)
Rebecca Novak [id number] [birth year] 75 y.o.  Subjective:   Patient ID:  Rebecca Novak is a 75 y.o. (DOB Oct 14, 1945) female.  Chief Complaint:  Chief Complaint  Patient presents with  . Follow-up  . Bipolar I disorder ([place])   Altered Mental Status   . Depression  . Anxiety  . Memory Loss  . Medication Problem  . Stress  . Sleeping Problem     Medication Refill Associated symptoms include arthralgias. Pertinent negatives include no fatigue or weakness.  Depression        Associated symptoms include decreased concentration.  Associated symptoms include no fatigue, no appetite change and no suicidal ideas.  Past medical history includes anxiety.   Anxiety Symptoms include decreased concentration and nervous/anxious behavior. Patient reports no confusion, dizziness or suicidal ideas.     Rebecca Novak presents to the office today for follow-up of above and bipolar disorder.    Patient called July 19, 2019 reporting that she had inadvertently stopped carbamazepine apparently as much as 6 months ago.  Given that the patient was not notably worse off that medication it was not restarted.  There also had been some question since her last appointment about her consistency with the clonazepam as well.  12/22/2019 appointment the following is noted: Since being here patient called back and has apparently been confused about whether she was taking Equetro or not as she was asking for refill and indicating that she had never stopped it.  So prescription refills were sent. "I misspoke"  Claims has been taking Equetro 400 mg HS. Continued good mood and no complaints.  Really well.  Anxiety is stable.  Weight and appetite better but diet is not good bc no veges or fruits. Plan: no med changes  05/03/20 appt with following noted: Rebecca Novak passed the day after Thanksgiving.  It's a big shock.  Has been staying with [facility] since then.  Will try to stay at her house tonight if  she can.   Rebecca Novak has a will.  Rebecca Novak has made Rebecca Novak her POA and agrees for ROI for Rebecca Novak.  Pt feels she can manage the estate with direction. Rebecca Novak has been supportive. Obviously sad.  Had been doing OK caring for him until he passed peacefully.  She liked caring for him.   Rebecca Novak was cremated.   Otherwise doing OK with meds and not med complaints.  Compliant. Plan: Continue Equetro 400 mg HS Latuda 120 mg  Quetiapine 100 mg 2 tablets at night  05/08/2020 phone call patient complained of insomnia.  Quetiapine was increased to 300 mg nightly  06/13/2020 numerous phone calls.  Per the patient and the sister there was inconsistent information about whether the patient had been compliant or not with carbamazepine and was missing some Latuda as well at times.  Patient seemed confused about her meds.  The nurse spoke with her to try to educate her to the extent possible.  The patient was encouraged to comply with the medications as prescribed.  To get help from her sister if needed.  08/29/2020 appointment with the following noted: Admits missing Latuda bc she didn't see it.  Probably for months. Bottle dated 09/2019 Compliant with Equetro 200 mg 2 at night and Quetiapine increased to 300 mg tablet, 1 at night. Seeking grief counseling.  Really missing Rebecca Novak. Thinks she's done overall OK otherwise.  She's gone back to [name].  Not socializing much.  Past Psychiatric Medication Trials: Equetro 1200, buspirone, Deplin, Anette Guarneri  120, duloxetine, Wellbutrin, Lexapro, sertraline, lithium no response, fluoxetine, Abilify 20, olanzapine, Geodon 160, gabapentin, lamotrigine 100, Depakote, trazodone, Ambien, clonazepam, temazepam, topiramate  Multiple problems. 2  hospitalizations (last 02/23/18) DT confusion and with high CBZ levels, the last was 13.9, but she was also dehydrated.   Dosage reduced now  Review of Systems:  Review of Systems  Constitutional: Negative for activity change, appetite  change, fatigue and unexpected weight change.  Gastrointestinal: Negative.   Musculoskeletal: Positive for arthralgias.  Neurological: Negative for dizziness, tremors and weakness.  Psychiatric/Behavioral: Positive for decreased concentration, depression and dysphoric mood. Negative for agitation, behavioral problems, confusion, hallucinations, self-injury, sleep disturbance and suicidal ideas. The patient is nervous/anxious. The patient is not hyperactive.   Not dizzy.   Medications: I have reviewed the patient's current medications.  I  Current Outpatient Medications  Medication Sig Dispense Refill  . levothyroxine (SYNTHROID) 100 MCG tablet Take 1 tablet by mouth once daily 30 tablet 1  . QUEtiapine (SEROQUEL) 300 MG tablet Take 1 tablet (300 mg total) by mouth at bedtime. 30 tablet 0  . SF 5000 PLUS 1.1 % CREA dental cream USE ONCE AT BEDTIME IN PLACE OF REGULAR TOOTHPASTE. BRUSH FOR 2 MINUTES EXPECTORATE. DO NOT RINSE    . simvastatin (ZOCOR) 40 MG tablet TAKE 1 TABLET BY MOUTH AT BEDTIME 30 tablet 5  . Vitamin D, Ergocalciferol, (DRISDOL) 1.25 MG (50000 UNIT) CAPS capsule Take 1 capsule (50,000 Units total) by mouth every 7 (seven) days. 12 capsule 0  . carbamazepine (EQUETRO) 200 MG CP12 12 hr capsule Take 2 capsules (400 mg total) by mouth at bedtime. 180 capsule 0  . metoprolol tartrate (LOPRESSOR) 25 MG tablet Take 1 tablet (25 mg total) by mouth daily as needed (palpitations). (Patient not taking: Reported on 08/29/2020) 90 tablet 3   No current facility-administered medications for this visit.    Medication Side Effects: None  Allergies: No Known Allergies  Past Medical History:  Diagnosis Date  . ANEMIA, MILD 05/17/2008  . BCC (basal cell carcinoma) 08/08/2009   right cheek (CX35FU +exc. )  . BCC (basal cell carcinoma) 03/12/2011   right sideburn   . BCC (basal cell carcinoma) 12/08/2012   right lower leg (CX35FU)  . BCC (basal cell carcinoma) 04/25/2015   left lower  back  . BCC (basal cell carcinoma) 05/27/2016   left shoulder (CX35FU), left cheek (CX35FU)  . 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 COLONIC POLYPS, HX OF 05/17/2007  . Depression    bipolar  . GLUCOSE INTOLERANCE 11/23/2007  . HAND PAIN 07/31/2008  . HYPERLIPIDEMIA 02/05/2007  . HYPOTHYROIDISM 02/05/2007  . 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 SCC (squamous cell carcinoma) 11/18/2007   right lower leg (CX35FU)  . SCC (squamous cell carcinoma) 03/12/2011   left outer calf  . SCC (squamous cell carcinoma) 11/19/2011   left side of chest -tx p bx  . SCC (squamous cell carcinoma) 12/08/2012   left chest (CX35FU)  . SCC (squamous cell carcinoma) 09/28/2014   left arm inferior (CX35FU), right arm, lateral (CX35FU)  . SCC (squamous cell carcinoma) 04/25/2015   rigth forearm   . SCC (squamous cell carcinoma) 01/10/2017   right shin proximal (CX35FU)  . SCC (squamous cell carcinoma) 04/21/2018   top right hand-tx p bx  . SYSTOLIC MURMUR 95/18/8416    Family History  Problem Relation Age of Onset  . Dementia Mother   . Healthy Father  until death at age 40  . Mental illness Brother        not been in contact in years  . Healthy Sister     Social History   Socioeconomic History  . Marital status: Married    Spouse name: [name]  . Number of children: 0  . Years of education: Not on file  . Highest education level: Not on file  Occupational History  . 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 Smoking status: Former Smoker    Packs/day: 0.33    Years: 44.00    Pack years: 14.52    Types: Cigarettes  . Smokeless tobacco: Never Used  . Tobacco comment: 02/15/2013 "stopped smoking cigarettes ~ 7 yr ago"  Vaping Use  . Vaping Use: Never used  Substance and Sexual Activity  . Alcohol use: No  . Drug use: No  . Sexual activity: Yes  Other Topics Concern  . Not on file   Social History Narrative  . Not on file   Social Determinants of Health   Financial Resource Strain: Low Risk   . Difficulty of Paying Living Expenses: Not hard at all  Food Insecurity: No Food Insecurity  . Worried About Charity fundraiser in the Last Year: Never true  . Ran Out of Food in the Last Year: Never true  Transportation Needs: No Transportation Needs  . Lack of Transportation (Medical): No  . Lack of Transportation (Non-Medical): No  Physical Activity: Inactive  . Days of Exercise per Week: 0 days  . Minutes of Exercise per Session: 0 min  Stress: No Stress Concern Present  . Feeling of Stress : Not at all  Social Connections: Moderately Integrated  . Frequency of Communication with Friends and Family: Three times a week  . Frequency of Social Gatherings with Friends and Family: Three times a week  . Attends Religious Services: 1 to 4 times per year  . Active Member of Clubs or Organizations: No  . Attends Archivist Meetings: Never  . Marital Status: Married  Human resources officer Violence: Not At Risk  . Fear of Current or Ex-Partner: No  . Emotionally Abused: No  . Physically Abused: No  . Sexually Abused: No    Past Medical History, Surgical history, Social history, and Family history were reviewed and updated as appropriate.   Rebecca Novak was in rehab awhile.  Back home.  Has terminal CA.  He's now dependent on her and she's always been dependent on him in the past.  She doesn't prepare meals.  Please see review of systems for further details on the patient's review from today.   Objective:   Physical Exam:  There were no vitals taken for this visit.  Physical Exam Constitutional:      General: She is not in acute distress.    Appearance: She is well-developed.  Musculoskeletal:        General: No deformity.  Neurological:     Mental Status: She is alert and oriented to person, place, and time.     Motor: No tremor.     Coordination: Coordination  normal.     Gait: Gait normal.  Psychiatric:        Attention and Perception: She is attentive. She does not perceive auditory hallucinations.        Mood and Affect: Mood is depressed. Mood is not anxious. Affect is tearful. Affect is not labile, blunt, angry or inappropriate.  Speech: Speech normal. Speech is not rapid and pressured, delayed or slurred.        Behavior: Behavior normal. Behavior is not agitated, aggressive, withdrawn or hyperactive.        Thought Content: Thought content normal. Thought content is not paranoid or delusional. Thought content does not include homicidal or suicidal ideation. Thought content does not include homicidal or suicidal plan.        Cognition and Memory: Cognition is impaired. Memory is impaired.        Judgment: Judgment normal.     Comments: Fair to poor insight and fair judgment. Less easily confused.     MMSE 27/30, recall 1/3, reduced attention on 03/26/18  Lab Review:     Component Value Date/Time   NA 142 01/13/2020 1130   K 4.0 01/13/2020 1130   CL 108 01/13/2020 1130   CO2 27 01/13/2020 1130   GLUCOSE 97 01/13/2020 1130   GLUCOSE 86 05/05/2006 0904   BUN 24 01/13/2020 1130   CREATININE 1.03 (H) 01/13/2020 1130   CALCIUM 8.6 01/13/2020 1130   PROT 6.0 (L) 01/13/2020 1130   ALBUMIN 3.9 12/15/2018 1152   AST 19 01/13/2020 1130   ALT 13 01/13/2020 1130   ALKPHOS 90 12/15/2018 1152   BILITOT 0.3 01/13/2020 1130   GFRNONAA >60 02/24/2018 0547   GFRAA >60 02/24/2018 0547       Component Value Date/Time   WBC 4.4 01/13/2020 1130   RBC 4.36 01/13/2020 1130   HGB 12.2 01/13/2020 1130   HCT 37.4 01/13/2020 1130   HCT 34.9 01/18/2018 0549   PLT 196 01/13/2020 1130   MCV 85.8 01/13/2020 1130   MCH 28.0 01/13/2020 1130   MCHC 32.6 01/13/2020 1130   RDW 15.2 (H) 01/13/2020 1130   LYMPHSABS 1,751 01/13/2020 1130   MONOABS 0.5 12/15/2018 1152   EOSABS 141 01/13/2020 1130   BASOSABS 31 01/13/2020 1130    No results found  for: POCLITH, LITHIUM   Lab Results  Component Value Date   CBMZ 6.1 12/15/2018    Never got labs requested in November and reminded at each appt and never got them. .res Assessment: Plan:    Rebecca Novak was seen today for follow-up and bipolar i disorder (hcc).  Diagnoses and all orders for this visit:  Bipolar I disorder ([place]) -     carbamazepine (EQUETRO) 200 MG CP12 12 hr capsule; Take 2 capsules (400 mg total) by mouth at bedtime.  Mild cognitive impairment  Grief  Generalized anxiety disorder  Chronic insomnia    Greater than 50% of 30-minute face to face time with patient was spent on counseling and coordination of care.  There is ongoing concern about whether the patient is going to be able to take care of herself without assistance.  We discussed Extensive discussion about her bipolar disorder which is under relatively fair control at the moment and unusual given the degree of stress that she is experiencing.  She has had previous bouts of delirium with poor self-care and dehydration but she has done a better job of managing this.  She is not having adverse side effects with the medication.  Reduced but didn't stop Equetro and mood stability is consistent and tolerated. No mania nor paranoia.  Appears to be tolerating meds.  Sleeping better with increased quetiapine to 300 HS. Reviewed bottles of meds with her. Bring all bottles to appts.  Continue Equetro 400 mg HS Latuda Hold bc she's apparently been off for months Quetiapine  300 tablets 1 at night   Disc compliance problems due to her getting confused.  Disc ways to imoprove compliance.  Not ideal for her to be living alone. Discussed importance of her self-care and compliance with medication for her overall mental stability.  Discussed potential metabolic side effects associated with atypical antipsychotics, as well as potential risk for movement side effects. Advised pt to contact office if movement side effects  occur.   Supportive therapy dealing with the severity of the stress associated with [name] death.  Continued grief work.  Rec grief group.  Emphasized adequate diet and fluid intake to prevent further episodes of delirium and orthostatic hypotension.   FU 4 mos  Lynder Parents, MD, DFAPA    Please see After Visit Summary for patient specific instructions.   Future Appointments  Date Time Provider [place]  10/24/2020  2:45 PM Lavonna Monarch, MD [facility]    No orders of the defined types were placed in this encounter.        -------------------------------

## 2020-09-10 ENCOUNTER — Telehealth: Payer: Self-pay | Admitting: Psychiatry

## 2020-09-10 NOTE — Telephone Encounter (Signed)
Pt wants to know if she should still be taking Latuda. Pt said that [facility] just called her about a refill. Pt has not been taking. Please call to clarify is she should or should not be taking.

## 2020-10-02 ENCOUNTER — Telehealth: Payer: Self-pay | Admitting: Psychiatry

## 2020-10-02 NOTE — Telephone Encounter (Signed)
Tried to reach pt a few times, her phone rings busy sometimes other times just keeps ringing. Will continue to try pt.

## 2020-10-02 NOTE — Telephone Encounter (Signed)
Rebecca Novak has called in. Rebecca Novak has made a decision to stay in Rebecca Novak. She does have a weekly routine and sees some people, but still is very lonely. Rebecca Novak has told her to come over any time. She was doing that regularly for a while, but now Rebecca Novak can't get her to come over for about two month to three months. Rebecca Novak, Rebecca Novak said that she just wanted to commit suicide. She left message in last two days that was very weird- She talked in a high pitched voice and just said she was calling to say hi. Rebecca Novak did call her back and spoke with her and she seemed ok but had Rebecca Novak said she was just trying to be funny. Not sure if you can bring this up with [facility]. Can we do a welfare check for her? [phone]

## 2020-10-05 ENCOUNTER — Telehealth: Payer: Self-pay | Admitting: Psychiatry

## 2020-10-05 NOTE — Telephone Encounter (Signed)
Next visit is 01/02/21. Rebecca Novak is requesting a letter. Her partner died and she is attending a hearing on Thursday 10/11/20 for her partners will. The house of her deceased partner was left to her but she didn't want the house at this time. She now wants the house and her partner's son, who has an attorney, wants her to pay $100,000 for it.   Rebecca Novak is asking if she can get a letter stating that because of her depression, she was unable to make an intelligent decision about the house the first time when she said no and misspoke. The hearing is on Thursday and should would like to get the letter when it is completed.

## 2020-10-05 NOTE — Telephone Encounter (Signed)
Please review

## 2020-10-08 DIAGNOSIS — Z0289 Encounter for other administrative examinations: Secondary | ICD-10-CM

## 2020-10-08 NOTE — Telephone Encounter (Signed)
Pt called again requesting letter asap

## 2020-10-08 NOTE — Telephone Encounter (Signed)
Addendum to previous note of 10/05/20.... Rebecca Novak called and said that she really needs this letter no later than Wednesday as the hearing she needs to attend with her deceased partner's son is on 2022/09/27 morning. She will pick the letter up here when ready. [phone].

## 2020-10-08 NOTE — Telephone Encounter (Signed)
Letter dictated and emailed.  Charge $50

## 2020-10-10 ENCOUNTER — Encounter: Payer: Self-pay | Admitting: Psychiatry

## 2020-10-10 ENCOUNTER — Ambulatory Visit (INDEPENDENT_AMBULATORY_CARE_PROVIDER_SITE_OTHER): Payer: Medicare Other | Admitting: Psychiatry

## 2020-10-10 ENCOUNTER — Other Ambulatory Visit: Payer: Self-pay

## 2020-10-10 DIAGNOSIS — F319 Bipolar disorder, unspecified: Secondary | ICD-10-CM

## 2020-10-10 DIAGNOSIS — F5104 Psychophysiologic insomnia: Secondary | ICD-10-CM | POA: Diagnosis not present

## 2020-10-10 DIAGNOSIS — F4321 Adjustment disorder with depressed mood: Secondary | ICD-10-CM | POA: Diagnosis not present

## 2020-10-10 DIAGNOSIS — G3184 Mild cognitive impairment, so stated: Secondary | ICD-10-CM

## 2020-10-10 DIAGNOSIS — F411 Generalized anxiety disorder: Secondary | ICD-10-CM | POA: Diagnosis not present

## 2020-10-10 NOTE — Progress Notes (Signed)
Rebecca Novak [id number] 1945/08/27 75 y.o.  Subjective:   Patient ID:  Rebecca Novak is a 75 y.o. (DOB [birth year]) female.  Chief Complaint:  Chief Complaint  Patient presents with  . Follow-up  . Bipolar I disorder ([place])  . Depression    Grief    Altered Mental Status   . Depression  . Anxiety  . Memory Loss  . Medication Problem  . Stress  . Sleeping Problem     Medication Refill Associated symptoms include arthralgias. Pertinent negatives include no fatigue or weakness.  Depression        Associated symptoms include decreased concentration.  Associated symptoms include no fatigue, no appetite change and no suicidal ideas.  Past medical history includes anxiety.   Anxiety Symptoms include decreased concentration and nervous/anxious behavior. Patient reports no confusion, dizziness or suicidal ideas.     Rebecca Novak presents to the office today for follow-up of above and bipolar disorder.    Patient called July 19, 2019 reporting that she had inadvertently stopped carbamazepine apparently as much as 6 months ago.  Given that the patient was not notably worse off that medication it was not restarted.  There also had been some question since her last appointment about her consistency with the clonazepam as well.  12/22/2019 appointment the following is noted: Since being here patient called back and has apparently been confused about whether she was taking Equetro or not as she was asking for refill and indicating that she had never stopped it.  So prescription refills were sent. "I misspoke"  Claims has been taking Equetro 400 mg HS. Continued good mood and no complaints.  Really well.  Anxiety is stable.  Weight and appetite better but diet is not good bc no veges or fruits. Plan: no med changes  05/03/20 appt with following noted: Rebecca Novak passed the day after Thanksgiving.  It's a big shock.  Has been staying with [facility] since then.  Will try to  stay at her house tonight if she can.   Rebecca Novak has a will.  Rebecca Novak has made [name] her POA and agrees for ROI for [name].  Pt feels she can manage the estate with direction. Rebecca Novak has been supportive. Obviously sad.  Had been doing OK caring for him until he passed peacefully.  She liked caring for him.   Rebecca Novak was cremated.   Otherwise doing OK with meds and not med complaints.  Compliant. Plan: Continue Equetro 400 mg HS Latuda 120 mg  Quetiapine 100 mg 2 tablets at night  05/08/2020 phone call patient complained of insomnia.  Quetiapine was increased to 300 mg nightly  06/13/2020 numerous phone calls.  Per the patient and the sister there was inconsistent information about whether the patient had been compliant or not with carbamazepine and was missing some Latuda as well at times.  Patient seemed confused about her meds.  The nurse spoke with her to try to educate her to the extent possible.  The patient was encouraged to comply with the medications as prescribed.  To get help from her sister if needed.  08/29/2020 appointment with the following noted: Admits missing Latuda bc she didn't see it.  Probably for months. Bottle dated 09/2019 Compliant with Equetro 200 mg 2 at night and Quetiapine increased to 300 mg tablet, 1 at night. Seeking grief counseling.  Really missing Rebecca Novak. Thinks she's done overall OK otherwise.  She's gone back to [name].  Not socializing much.  10/10/2020  appointment with the following noted: Not sure how this appt got scheduled.  Denies calling and saying she had SI. "Busy and Happy".   Conflict with Rebecca Novak's family trying to insist she pay $100K for the house.  Provided her with letter as requested.    Stressed over it.  Dealing with estate issues.  His son is the executor of the estate.  Rebecca Novak left the house to her but their are loans against it.  Past Psychiatric Medication Trials: Equetro 1200, buspirone, Deplin, Latuda 120, duloxetine, Wellbutrin,  Lexapro, sertraline, lithium no response, fluoxetine, Abilify 20, olanzapine, Geodon 160, gabapentin, lamotrigine 100, Depakote, trazodone, Ambien, clonazepam, temazepam, topiramate  Multiple problems. 2  hospitalizations (last 02/23/18) DT confusion and with high CBZ levels, the last was 13.9, but she was also dehydrated.   Dosage reduced now  Review of Systems:  Review of Systems  Constitutional: Negative for activity change, appetite change, fatigue and unexpected weight change.  Gastrointestinal: Negative.   Musculoskeletal: Positive for arthralgias.  Neurological: Negative for dizziness, tremors and weakness.  Psychiatric/Behavioral: Positive for decreased concentration and depression. Negative for agitation, behavioral problems, confusion, dysphoric mood, hallucinations, self-injury, sleep disturbance and suicidal ideas. The patient is nervous/anxious. The patient is not hyperactive.   Not dizzy.   Medications: I have reviewed the patient's current medications.  I  Current Outpatient Medications  Medication Sig Dispense Refill  . carbamazepine (EQUETRO) 200 MG CP12 12 hr capsule Take 2 capsules (400 mg total) by mouth at bedtime. 180 capsule 0  . levothyroxine (SYNTHROID) 100 MCG tablet Take 1 tablet by mouth once daily 30 tablet 1  . QUEtiapine (SEROQUEL) 300 MG tablet Take 1 tablet (300 mg total) by mouth at bedtime. 30 tablet 0  . SF 5000 PLUS 1.1 % CREA dental cream USE ONCE AT BEDTIME IN PLACE OF REGULAR TOOTHPASTE. BRUSH FOR 2 MINUTES EXPECTORATE. DO NOT RINSE    . simvastatin (ZOCOR) 40 MG tablet TAKE 1 TABLET BY MOUTH AT BEDTIME 30 tablet 5  . Vitamin D, Ergocalciferol, (DRISDOL) 1.25 MG (50000 UNIT) CAPS capsule Take 1 capsule (50,000 Units total) by mouth every 7 (seven) days. 12 capsule 0  . metoprolol tartrate (LOPRESSOR) 25 MG tablet Take 1 tablet (25 mg total) by mouth daily as needed (palpitations). (Patient not taking: Reported on 10/10/2020) 90 tablet 3   No current  facility-administered medications for this visit.    Medication Side Effects: None  Allergies: No Known Allergies  Past Medical History:  Diagnosis Date  . ANEMIA, MILD 05/17/2008  . BCC (basal cell carcinoma) 08/08/2009   right cheek (CX35FU +exc. )  . BCC (basal cell carcinoma) 03/12/2011   right sideburn   . BCC (basal cell carcinoma) 12/08/2012   right lower leg (CX35FU)  . BCC (basal cell carcinoma) 04/25/2015   left lower back  . BCC (basal cell carcinoma) 05/27/2016   left shoulder (CX35FU), left cheek (CX35FU)  . 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 COLONIC POLYPS, HX OF 05/17/2007  . Depression    bipolar  . GLUCOSE INTOLERANCE 11/23/2007  . HAND PAIN 07/31/2008  . HYPERLIPIDEMIA 02/05/2007  . HYPOTHYROIDISM 02/05/2007  . 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 SCC (squamous cell carcinoma) 11/18/2007   right lower leg (CX35FU)  . SCC (squamous cell carcinoma) 03/12/2011   left outer calf  . SCC (squamous cell carcinoma) 11/19/2011   left side of chest -tx p bx  . SCC (squamous cell carcinoma) 12/08/2012  left chest (CX35FU)  . SCC (squamous cell carcinoma) 09/28/2014   left arm inferior (CX35FU), right arm, lateral (CX35FU)  . SCC (squamous cell carcinoma) 04/25/2015   rigth forearm   . SCC (squamous cell carcinoma) 01/10/2017   right shin proximal (CX35FU)  . SCC (squamous cell carcinoma) 04/21/2018   top right hand-tx p bx  . SYSTOLIC MURMUR [id number]    Family History  Problem Relation Age of Onset  . Dementia Mother   . Healthy Father        until death at age 25  . Mental illness Brother        not been in contact in years  . Healthy Sister     Social History   Socioeconomic History  . Marital status: Married    Spouse name: [name]  . Number of children: 0  . Years of education: Not on file  . Highest education level: Not on file  Occupational History  . 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 Smoking status: Former Smoker    Packs/day: 0.33    Years: 44.00    Pack years: 14.52    Types: Cigarettes  . Smokeless tobacco: Never Used  . Tobacco comment: 02/15/2013 "stopped smoking cigarettes ~ 7 yr ago"  Vaping Use  . Vaping Use: Never used  Substance and Sexual Activity  . Alcohol use: No  . Drug use: No  . Sexual activity: Yes  Other Topics Concern  . Not on file  Social History Narrative  . Not on file   Social Determinants of Health   Financial Resource Strain: Low Risk   . Difficulty of Paying Living Expenses: Not hard at all  Food Insecurity: No Food Insecurity  . Worried About Charity fundraiser in the Last Year: Never true  . Ran Out of Food in the Last Year: Never true  Transportation Needs: No Transportation Needs  . Lack of Transportation (Medical): No  . Lack of Transportation (Non-Medical): No  Physical Activity: Inactive  . Days of Exercise per Week: 0 days  . Minutes of Exercise per Session: 0 min  Stress: No Stress Concern Present  . Feeling of Stress : Not at all  Social Connections: Moderately Integrated  . Frequency of Communication with Friends and Family: Three times a week  . Frequency of Social Gatherings with Friends and Family: Three times a week  . Attends Religious Services: 1 to 4 times per year  . Active Member of Clubs or Organizations: No  . Attends Archivist Meetings: Never  . Marital Status: Married  Human resources officer Violence: Not At Risk  . Fear of Current or Ex-Partner: No  . Emotionally Abused: No  . Physically Abused: No  . Sexually Abused: No    Past Medical History, Surgical history, Social history, and Family history were reviewed and updated as appropriate.   Rebecca Novak was in rehab awhile.  Back home.  Has terminal CA.  He's now dependent on her and she's always been dependent on him in the past.  She doesn't prepare meals.  Please see review of systems  for further details on the patient's review from today.   Objective:   Physical Exam:  There were no vitals taken for this visit.  Physical Exam Constitutional:      General: She is not in acute distress.    Appearance: She is well-developed.  Musculoskeletal:  General: No deformity.  Neurological:     Mental Status: She is alert and oriented to person, place, and time.     Motor: No tremor.     Coordination: Coordination normal.     Gait: Gait normal.  Psychiatric:        Attention and Perception: She is attentive. She does not perceive auditory hallucinations.        Mood and Affect: Mood is not anxious or depressed. Affect is not labile, blunt, angry, tearful or inappropriate.        Speech: Speech normal. Speech is not rapid and pressured, delayed or slurred.        Behavior: Behavior normal. Behavior is not agitated, aggressive, withdrawn or hyperactive.        Thought Content: Thought content normal. Thought content is not paranoid or delusional. Thought content does not include homicidal or suicidal ideation. Thought content does not include homicidal or suicidal plan.        Cognition and Memory: Cognition is impaired. Memory is impaired.        Judgment: Judgment normal.     Comments: Fair to poor insight and fair judgment. Less easily confused.     MMSE 27/30, recall 1/3, reduced attention on 03/26/18  Lab Review:     Component Value Date/Time   NA 142 01/13/2020 1130   K 4.0 01/13/2020 1130   CL 108 01/13/2020 1130   CO2 27 01/13/2020 1130   GLUCOSE 97 01/13/2020 1130   GLUCOSE 86 05/05/2006 0904   BUN 24 01/13/2020 1130   CREATININE 1.03 (H) 01/13/2020 1130   CALCIUM 8.6 01/13/2020 1130   PROT 6.0 (L) 01/13/2020 1130   ALBUMIN 3.9 12/15/2018 1152   AST 19 01/13/2020 1130   ALT 13 01/13/2020 1130   ALKPHOS 90 12/15/2018 1152   BILITOT 0.3 01/13/2020 1130   GFRNONAA >60 02/24/2018 0547   GFRAA >60 02/24/2018 0547       Component Value Date/Time    WBC 4.4 01/13/2020 1130   RBC 4.36 01/13/2020 1130   HGB 12.2 01/13/2020 1130   HCT 37.4 01/13/2020 1130   HCT 34.9 01/18/2018 0549   PLT 196 01/13/2020 1130   MCV 85.8 01/13/2020 1130   MCH 28.0 01/13/2020 1130   MCHC 32.6 01/13/2020 1130   RDW 15.2 (H) 01/13/2020 1130   LYMPHSABS 1,751 01/13/2020 1130   MONOABS 0.5 12/15/2018 1152   EOSABS 141 01/13/2020 1130   BASOSABS 31 01/13/2020 1130    No results found for: POCLITH, LITHIUM   Lab Results  Component Value Date   CBMZ 6.1 12/15/2018    Never got labs requested in November and reminded at each appt and never got them. .res Assessment: Plan:    [place] was seen today for follow-up, bipolar i disorder (hcc) and depression.  Diagnoses and all orders for this visit:  Bipolar I disorder ([place])  Mild cognitive impairment  Grief  Generalized anxiety disorder  Chronic insomnia    Greater than 50% of 30-minute face to face time with patient was spent on counseling and coordination of care.  There is ongoing concern about whether the patient is going to be able to take care of herself without assistance.  We discussed Extensive discussion about her bipolar disorder which is under relatively fair control at the moment and unusual given the degree of stress that she is experiencing.  She has had previous bouts of delirium with poor self-care and dehydration but she has done a better job  of managing this.  She is not having adverse side effects with the medication.  Reduced but didn't stop Equetro and mood stability is consistent and tolerated. No mania nor paranoia.  Appears to be tolerating meds.  Sleeping better with increased quetiapine to 300 HS. Reviewed bottles of meds with her. Bring all bottles to appts.  Continue Equetro 400 mg HS Latuda Hold bc she's apparently been off for months Quetiapine 300 tablets 1 at night  No med changes indicated.   Disc compliance problems due to her getting confused.  Disc ways to  imoprove compliance.  Not ideal for her to be living alone. Discussed importance of her self-care and compliance with medication for her overall mental stability.  Discussed potential metabolic side effects associated with atypical antipsychotics, as well as potential risk for movement side effects. Advised pt to contact office if movement side effects occur.   Supportive therapy dealing with the severity of the stress associated with Rebecca Novak's death.  Continued grief work.  Rec grief group. Disc stress dealing with Rebecca Novak's house and $.  Emphasized adequate diet and fluid intake to prevent further episodes of delirium and orthostatic hypotension.   FU 4 mos  Lynder Parents, MD, DFAPA    Please see After Visit Summary for patient specific instructions.   Future Appointments  Date Time Provider [place]  11/21/2020  2:45 PM Lavonna Monarch, MD [facility]  01/02/2021  1:30 PM Cottle, Billey Co., MD [facility] None    No orders of the defined types were placed in this encounter.        -------------------------------

## 2020-10-11 ENCOUNTER — Other Ambulatory Visit: Payer: Self-pay | Admitting: Psychiatry

## 2020-10-11 DIAGNOSIS — F319 Bipolar disorder, unspecified: Secondary | ICD-10-CM

## 2020-10-15 NOTE — Telephone Encounter (Signed)
Pt left 2 messages about this needing this refill.

## 2020-10-16 ENCOUNTER — Telehealth: Payer: Self-pay | Admitting: Psychiatry

## 2020-10-16 NOTE — Telephone Encounter (Signed)
Spoke to pharmacy and it won't be mailed out until 5/28.Can we give her samples in the meantime?

## 2020-10-16 NOTE — Telephone Encounter (Signed)
Yes.  The samples we have a 100 100 mg size.  Please make sure she knows how to take them properly at whatever dose I indicated at the last visit.

## 2020-10-16 NOTE — Telephone Encounter (Signed)
Ellionna Buckbee asking about her Equestro 200 mg caps. She wants to know if it was filled. She said that the pharmacy hasnt received it yet?

## 2020-10-17 NOTE — Telephone Encounter (Signed)
Yes I told her how to take them

## 2020-10-17 NOTE — Telephone Encounter (Signed)
She will come get samples today

## 2020-10-17 NOTE — Telephone Encounter (Signed)
When she gets here please review with her how to take the samples to equal her current dose.  She is unlikely to figure that out on her own.

## 2020-10-24 ENCOUNTER — Ambulatory Visit: Payer: Medicare Other | Admitting: Dermatology

## 2020-10-30 ENCOUNTER — Other Ambulatory Visit: Payer: Self-pay | Admitting: Psychiatry

## 2020-10-30 DIAGNOSIS — F319 Bipolar disorder, unspecified: Secondary | ICD-10-CM

## 2020-10-30 DIAGNOSIS — F5104 Psychophysiologic insomnia: Secondary | ICD-10-CM

## 2020-11-21 ENCOUNTER — Other Ambulatory Visit: Payer: Self-pay

## 2020-11-21 ENCOUNTER — Ambulatory Visit (INDEPENDENT_AMBULATORY_CARE_PROVIDER_SITE_OTHER): Payer: Medicare Other | Admitting: Dermatology

## 2020-11-21 DIAGNOSIS — Z85828 Personal history of other malignant neoplasm of skin: Secondary | ICD-10-CM | POA: Diagnosis not present

## 2020-11-21 DIAGNOSIS — Z1283 Encounter for screening for malignant neoplasm of skin: Secondary | ICD-10-CM

## 2020-11-21 DIAGNOSIS — L570 Actinic keratosis: Secondary | ICD-10-CM

## 2020-11-21 DIAGNOSIS — L729 Follicular cyst of the skin and subcutaneous tissue, unspecified: Secondary | ICD-10-CM

## 2020-11-23 ENCOUNTER — Other Ambulatory Visit: Payer: Self-pay | Admitting: Family Medicine

## 2020-11-25 NOTE — Telephone Encounter (Signed)
Patient needs visit set up. Last office visit was virtual and 11 months ago.

## 2020-11-27 NOTE — Telephone Encounter (Signed)
Spoke with the pt and informed her of the message below.  Per pts preference, a follow up appt was scheduled for 8/3 to arrive at 2:45pm.

## 2020-12-09 ENCOUNTER — Encounter: Payer: Self-pay | Admitting: Dermatology

## 2020-12-09 NOTE — Progress Notes (Signed)
   Follow-Up Visit   Subjective  Rebecca Novak is a 75 y.o. female who presents for the following: Annual Exam (Here for annual skin exam. No concerns. History of non mole skin cancers. ).  General skin examination, possible change in nodule back to right scalp Location:  Duration:  Quality:  Associated Signs/Symptoms: Modifying Factors:  Severity:  Timing: Context:   Objective  Well appearing patient in no apparent distress; mood and affect are within normal limits. Scalp 30 minutes surgery: 1.8 cm deep dermal to subcutaneous mobile noninflamed nodule.     Left Forearm - Posterior (3), Left Posterior Mandible, Right Forearm - Posterior (3), Right Zygomatic Area Multiple gritty and hornlike 2 to 4 mm pink crusts  Torso - Posterior (Back) Waist up plus legs examined, no atypical pigmented lesions.    All sun exposed areas plus back examined.  Plus upper chest.   Assessment & Plan    Cyst of skin Scalp  Patient states historically this has grown so I do recommend she schedule excision.  AK (actinic keratosis) (8) Left Forearm - Posterior (3); Right Forearm - Posterior (3); Right Zygomatic Area; Left Posterior Mandible  Destruction of lesion - Left Forearm - Posterior, Left Posterior Mandible, Right Forearm - Posterior, Right Zygomatic Area Complexity: simple   Destruction method: cryotherapy   Informed consent: discussed and consent obtained   Timeout:  patient name, date of birth, surgical site, and procedure verified Lesion destroyed using liquid nitrogen: Yes   Cryotherapy cycles:  4 Outcome: patient tolerated procedure well with no complications   Post-procedure details: wound care instructions given    Encounter for screening for malignant neoplasm of skin Torso - Posterior (Back)  Annual skin examination      I, Lavonna Monarch, MD, have reviewed all documentation for this visit.  The documentation on 12/09/20 for the exam, diagnosis,  procedures, and orders are all accurate and complete.

## 2020-12-26 ENCOUNTER — Encounter: Payer: Self-pay | Admitting: Family Medicine

## 2020-12-26 ENCOUNTER — Ambulatory Visit (INDEPENDENT_AMBULATORY_CARE_PROVIDER_SITE_OTHER): Payer: Medicare Other | Admitting: Family Medicine

## 2020-12-26 ENCOUNTER — Other Ambulatory Visit: Payer: Self-pay

## 2020-12-26 VITALS — BP 100/50 | HR 60 | Temp 98.6°F | Ht 68.0 in | Wt 166.6 lb

## 2020-12-26 DIAGNOSIS — K921 Melena: Secondary | ICD-10-CM

## 2020-12-26 DIAGNOSIS — E538 Deficiency of other specified B group vitamins: Secondary | ICD-10-CM

## 2020-12-26 DIAGNOSIS — E039 Hypothyroidism, unspecified: Secondary | ICD-10-CM | POA: Diagnosis not present

## 2020-12-26 DIAGNOSIS — F319 Bipolar disorder, unspecified: Secondary | ICD-10-CM | POA: Diagnosis not present

## 2020-12-26 DIAGNOSIS — E785 Hyperlipidemia, unspecified: Secondary | ICD-10-CM | POA: Diagnosis not present

## 2020-12-26 DIAGNOSIS — E559 Vitamin D deficiency, unspecified: Secondary | ICD-10-CM | POA: Diagnosis not present

## 2020-12-26 MED ORDER — SHINGRIX 50 MCG/0.5ML IM SUSR: 0.5000 mL | Freq: Once | INTRAMUSCULAR | 0 refills | Status: AC

## 2020-12-26 NOTE — Progress Notes (Signed)
Rebecca Novak DOB: Mar 16, 1946 Encounter date: 12/26/2020  This is a 75 y.o. female who presents with Chief Complaint  Patient presents with   Follow-up    History of present illness: Significant other passed in November. She has stayed in house. Stayed with sister some - lives in [place]. She went to [facility] with a friend of sister and liked it. Joined the [employer] here and working out one hour every day. Feels like it has helped with her adjustment of lost.   Talks with hospice counselor every other week.   Was getting dinners at [employer]. Tries to eat good lunch and then light supper. Trying to get good variety.   Right foot/ball of foot has some issues. Saw foot doctor who treated her with brace. Wondering if she can get handicap parking sticker. Doesn't walk too much. Makes sure she limits walking because otherwise she has pain.   Had black stool last week. Not happened since that time. No abd pain, no bright red blood. Last colonoscopy 2016; was supposed to follow up for recheck.    No Known Allergies Current Meds  Medication Sig   EQUETRO 200 MG CP12 12 hr capsule TAKE 1 CAPSULE EVERY       MORNING AND 2 CAPSULES AT  NIGHT, DISCONTINUE EQUETRO '300MG'$  (Patient taking differently: Take 400 mg by mouth at bedtime.)   metoprolol tartrate (LOPRESSOR) 25 MG tablet Take 1 tablet (25 mg total) by mouth daily as needed (palpitations).   QUEtiapine (SEROQUEL) 300 MG tablet TAKE 1 TABLET AT BEDTIME   SF 5000 PLUS 1.1 % CREA dental cream USE ONCE AT BEDTIME IN PLACE OF REGULAR TOOTHPASTE. BRUSH FOR 2 MINUTES EXPECTORATE. DO NOT RINSE   simvastatin (ZOCOR) 40 MG tablet TAKE 1 TABLET BY MOUTH AT BEDTIME   [DISCONTINUED] levothyroxine (SYNTHROID) 100 MCG tablet Take 1 tablet by mouth once daily    Review of Systems  Constitutional:  Negative for activity change, appetite change, chills, fatigue, fever and unexpected weight change.  HENT:  Negative for congestion, ear pain, hearing  loss, sinus pressure, sinus pain, sore throat and trouble swallowing.   Eyes:  Negative for pain and visual disturbance.  Respiratory:  Negative for cough, chest tightness, shortness of breath and wheezing.   Cardiovascular:  Negative for chest pain, palpitations and leg swelling.  Gastrointestinal:  Negative for abdominal pain, blood in stool (black stool, see hpi), constipation, diarrhea, nausea and vomiting.  Genitourinary:  Negative for difficulty urinating and menstrual problem.  Musculoskeletal:  Negative for arthralgias and back pain.  Skin:  Negative for rash.  Neurological:  Negative for dizziness, weakness, numbness and headaches.  Hematological:  Negative for adenopathy. Does not bruise/bleed easily.  Psychiatric/Behavioral:  Negative for sleep disturbance and suicidal ideas. The patient is not nervous/anxious.    Objective:  BP (!) 100/50 (BP Location: Left Arm, Patient Position: Sitting, Cuff Size: Large)   Pulse 60   Temp 98.6 F (37 C) (Oral)   Ht '5\' 8"'$  (1.727 m)   Wt 166 lb 9.6 oz (75.6 kg)   BMI 25.33 kg/m   Weight: 166 lb 9.6 oz (75.6 kg)   BP Readings from Last 3 Encounters:  12/26/20 (!) 100/50  02/01/20 106/60  11/03/19 133/74   Wt Readings from Last 3 Encounters:  12/26/20 166 lb 9.6 oz (75.6 kg)  02/01/20 191 lb (86.6 kg)  04/05/18 167 lb 9.6 oz (76 kg)    Physical Exam Constitutional:      General: She  is not in acute distress.    Appearance: She is well-developed.  HENT:     Head: Normocephalic and atraumatic.     Right Ear: External ear normal.     Left Ear: External ear normal.     Mouth/Throat:     Pharynx: No oropharyngeal exudate.  Eyes:     Conjunctiva/sclera: Conjunctivae normal.     Pupils: Pupils are equal, round, and reactive to light.  Neck:     Thyroid: No thyromegaly.  Cardiovascular:     Rate and Rhythm: Normal rate and regular rhythm.     Heart sounds: Normal heart sounds. No murmur heard.   No friction rub. No gallop.   Pulmonary:     Effort: Pulmonary effort is normal.     Breath sounds: Normal breath sounds.  Abdominal:     General: Bowel sounds are normal. There is no distension.     Palpations: Abdomen is soft. There is no mass.     Tenderness: There is no abdominal tenderness. There is no guarding.     Hernia: No hernia is present.  Musculoskeletal:        General: No tenderness or deformity. Normal range of motion.     Cervical back: Normal range of motion and neck supple.  Lymphadenopathy:     Cervical: No cervical adenopathy.  Skin:    General: Skin is warm and dry.     Findings: No rash.  Neurological:     Mental Status: She is alert and oriented to person, place, and time.     Deep Tendon Reflexes: Reflexes normal.     Reflex Scores:      Tricep reflexes are 2+ on the right side and 2+ on the left side.      Bicep reflexes are 2+ on the right side and 2+ on the left side.      Brachioradialis reflexes are 2+ on the right side and 2+ on the left side.      Patellar reflexes are 2+ on the right side and 2+ on the left side. Psychiatric:        Speech: Speech normal.        Behavior: Behavior normal.        Thought Content: Thought content normal.    Assessment/Plan  1. B12 deficiency - Vitamin B12; Future - Vitamin B12  2. Vitamin D deficiency - VITAMIN D 25 Hydroxy (Vit-D Deficiency, Fractures); Future - VITAMIN D 25 Hydroxy (Vit-D Deficiency, Fractures)  3. Acquired hypothyroidism She stopped taking thyroid medication, uncertain why.  We will recheck labs for baseline level to determine what dose (if any) she needs to restart. - CBC with Differential/Platelet; Future - TSH; Future - TSH - CBC with Differential/Platelet  4. Dyslipidemia Continue with simvastatin 40 mg daily. - Comprehensive metabolic panel; Future - Lipid panel; Future - Lipid panel - Comprehensive metabolic panel  5. Bipolar affective disorder, remission status unspecified ([place]) Follows with.   Well-controlled to be working help maintain a positive mood as she goes through grieving for loss of significant other.   6.  Black stool Encouraged to call Dr. Osborn Coho for follow-up colonoscopy.  Let me know if A1c  Return in about 3 months (around 03/28/2021) for Chronic condition visit. 45 minutes total time spent with patient, charting, chart review.  We reviewed some tips for eating more well-rounded when cooking for herself alone.  We discussed having foods that can be frozen to have on hand (like meatloaf) and discussed this  at the grocery store which would include a vegetable.  She is going to work on this and see how she does, but if she feels she still struggling to cook for herself we can refer to dietitian.   Micheline Rough, MD

## 2020-12-26 NOTE — Patient Instructions (Addendum)
Call Dr. Osborn Coho office for follow up colonoscopy and let them know about dark stool: [phone]. Let me know if they cannot see you in the next month.

## 2020-12-27 ENCOUNTER — Telehealth: Payer: Self-pay | Admitting: Family Medicine

## 2020-12-27 LAB — CBC WITH DIFFERENTIAL/PLATELET
Basophils Absolute: 0.1 10*3/uL (ref 0.0–0.1)
Basophils Relative: 1.1 % (ref 0.0–3.0)
Eosinophils Absolute: 0.1 10*3/uL (ref 0.0–0.7)
Eosinophils Relative: 1.7 % (ref 0.0–5.0)
HCT: 38.9 % (ref 36.0–46.0)
Hemoglobin: 13 g/dL (ref 12.0–15.0)
Lymphocytes Relative: 28.3 % (ref 12.0–46.0)
Lymphs Abs: 1.6 10*3/uL (ref 0.7–4.0)
MCHC: 33.5 g/dL (ref 30.0–36.0)
MCV: 88.1 fl (ref 78.0–100.0)
Monocytes Absolute: 0.5 10*3/uL (ref 0.1–1.0)
Monocytes Relative: 7.8 % (ref 3.0–12.0)
Neutro Abs: 3.6 10*3/uL (ref 1.4–7.7)
Neutrophils Relative %: 61.1 % (ref 43.0–77.0)
Platelets: 199 10*3/uL (ref 150.0–400.0)
RBC: 4.42 Mil/uL (ref 3.87–5.11)
RDW: 13.8 % (ref 11.5–15.5)
WBC: 5.8 10*3/uL (ref 4.0–10.5)

## 2020-12-27 LAB — COMPREHENSIVE METABOLIC PANEL
ALT: 20 U/L (ref 0–35)
AST: 28 U/L (ref 0–37)
Albumin: 4.1 g/dL (ref 3.5–5.2)
Alkaline Phosphatase: 73 U/L (ref 39–117)
BUN: 21 mg/dL (ref 6–23)
CO2: 25 mEq/L (ref 19–32)
Calcium: 9 mg/dL (ref 8.4–10.5)
Chloride: 106 mEq/L (ref 96–112)
Creatinine, Ser: 0.99 mg/dL (ref 0.40–1.20)
GFR: 55.15 mL/min — ABNORMAL LOW (ref >60.00)
Glucose, Bld: 90 mg/dL (ref 70–99)
Potassium: 4.1 mEq/L (ref 3.5–5.1)
Sodium: 141 mEq/L (ref 135–145)
Total Bilirubin: 0.3 mg/dL (ref 0.2–1.2)
Total Protein: 6.8 g/dL (ref 6.0–8.3)

## 2020-12-27 LAB — LIPID PANEL
Cholesterol: 211 mg/dL — ABNORMAL HIGH (ref 0–200)
HDL: 63.2 mg/dL (ref >39.00)
LDL Cholesterol: 127 mg/dL — ABNORMAL HIGH (ref 0–99)
NonHDL: 148.27
Total CHOL/HDL Ratio: 3
Triglycerides: 107 mg/dL (ref 0.0–149.0)
VLDL: 21.4 mg/dL (ref 0.0–40.0)

## 2020-12-27 LAB — TSH: TSH: 4.52 u[IU]/mL (ref 0.35–5.50)

## 2020-12-27 LAB — VITAMIN B12: Vitamin B-12: 251 pg/mL (ref 211–911)

## 2020-12-27 LAB — VITAMIN D 25 HYDROXY (VIT D DEFICIENCY, FRACTURES): VITD: 32.96 ng/mL (ref 30.00–100.00)

## 2020-12-27 NOTE — Telephone Encounter (Signed)
Patient states that Dr. Ethlyn Gallery wrote her a note with additional information that was given to her during her visit on 08/03. Patient assumes she left papers in office and wants a copy of the same letter.  Patient was given copy of AVS but she states it was another note.  Please advise.

## 2020-12-28 NOTE — Telephone Encounter (Signed)
Not sure what she is referring to. I wrote Dr. Osborn Coho information for her to call for colonoscopy on her AVS. She was taking notes on everything else. If she wants me to summarize what we talked about I can make a more detailed note for her to mail out.

## 2020-12-28 NOTE — Telephone Encounter (Signed)
No answer at the patient's home number. 

## 2020-12-31 ENCOUNTER — Other Ambulatory Visit: Payer: Self-pay | Admitting: Family Medicine

## 2020-12-31 DIAGNOSIS — E039 Hypothyroidism, unspecified: Secondary | ICD-10-CM

## 2020-12-31 NOTE — Telephone Encounter (Signed)
No answer at the patient's home number. 

## 2021-01-02 ENCOUNTER — Ambulatory Visit: Payer: Medicare Other | Admitting: Psychiatry

## 2021-01-02 NOTE — Telephone Encounter (Signed)
No answer at the patient's home number. 

## 2021-01-09 ENCOUNTER — Encounter: Payer: Self-pay | Admitting: *Deleted

## 2021-01-10 ENCOUNTER — Encounter: Payer: Medicare Other | Admitting: Dermatology

## 2021-01-16 ENCOUNTER — Other Ambulatory Visit: Payer: Self-pay | Admitting: Family Medicine

## 2021-01-17 ENCOUNTER — Telehealth: Payer: Self-pay | Admitting: Family Medicine

## 2021-01-17 NOTE — Telephone Encounter (Signed)
Spoke with the patient-please see results note.

## 2021-01-17 NOTE — Telephone Encounter (Signed)
Patient called to get a refill on levothyroxine (SYNTHROID) 100 MCG tablet [id number]  DISCONTINUED   Patient states she could not get any from [facility] and they directed her to contact PCP      Good callback number is [phone]        Please Advise.

## 2021-01-17 NOTE — Telephone Encounter (Signed)
Patient called back and was informed of the message below.  Stated she does not recall and was given the number to contact Dr Buccini's office at [phone].

## 2021-01-18 ENCOUNTER — Telehealth: Payer: Self-pay | Admitting: Family Medicine

## 2021-01-18 ENCOUNTER — Telehealth: Payer: Self-pay

## 2021-01-18 NOTE — Telephone Encounter (Signed)
See prior phone note. 

## 2021-01-18 NOTE — Telephone Encounter (Signed)
Spoke with the pt and advised her per results from PCP to begin taking vitamin B12 supplement 500-1074mg sublingual just 1 day/week and she agreed.

## 2021-01-18 NOTE — Telephone Encounter (Signed)
Patient is requesting to speak with JoAnne.   Patient can be contacted at [phone].  Please advise.

## 2021-01-18 NOTE — Telephone Encounter (Signed)
Patient called needing clarification on how to take B- 12 supplements. Pt asked if you can call back before 3:30

## 2021-01-22 ENCOUNTER — Telehealth: Payer: Self-pay

## 2021-01-22 ENCOUNTER — Other Ambulatory Visit: Payer: Self-pay | Admitting: Family Medicine

## 2021-01-22 NOTE — Telephone Encounter (Signed)
Patient called wanting to know if she should continue taking levothyroxine (SYNTHROID) 100 MCG tablet  Pt stated she only has a couple pills left

## 2021-01-23 NOTE — Telephone Encounter (Signed)
When I saw her last she was not regularly taking the synthroid. Please just clarify if she has been/was taking? Levels were normal when we checked and she told me she hadn't taken in a long time; so I told her to stay off with a close recheck in 2 months (she is set for lab visit in October). Just want to make sure i'm on same page as her. Takes about 8 weeks to adjust to medication, so timing of labs is important depending on what she is taking.

## 2021-01-23 NOTE — Telephone Encounter (Signed)
Patient was informed of result note. Pt stated she has been overwhelmed and forgot,  pt was reminded of upcoming lab visit

## 2021-01-24 NOTE — Telephone Encounter (Signed)
Patient informed of the message below.  Patient questioned if B12 was the thyroid medication and I told her B12 is not a thyroid medication.  Patient stated she was confused as she found a bottle of Levothyroxine and wondered if she should be taking this.  Patient was again informed of the message below and agreed to keep the lab appt.

## 2021-02-07 ENCOUNTER — Encounter: Payer: Medicare Other | Admitting: Dermatology

## 2021-02-21 ENCOUNTER — Other Ambulatory Visit: Payer: Self-pay | Admitting: Psychiatry

## 2021-02-21 DIAGNOSIS — F5104 Psychophysiologic insomnia: Secondary | ICD-10-CM

## 2021-02-21 DIAGNOSIS — F319 Bipolar disorder, unspecified: Secondary | ICD-10-CM

## 2021-02-22 ENCOUNTER — Other Ambulatory Visit: Payer: Self-pay | Admitting: Family Medicine

## 2021-02-22 NOTE — Telephone Encounter (Signed)
90 day ok?

## 2021-03-06 ENCOUNTER — Other Ambulatory Visit: Payer: Self-pay

## 2021-03-06 ENCOUNTER — Other Ambulatory Visit (INDEPENDENT_AMBULATORY_CARE_PROVIDER_SITE_OTHER): Payer: Medicare Other

## 2021-03-06 ENCOUNTER — Other Ambulatory Visit: Payer: Self-pay | Admitting: Family Medicine

## 2021-03-06 DIAGNOSIS — E039 Hypothyroidism, unspecified: Secondary | ICD-10-CM

## 2021-03-06 LAB — TSH: TSH: 7.15 u[IU]/mL — ABNORMAL HIGH (ref 0.35–5.50)

## 2021-03-06 LAB — T3, FREE: T3, Free: 2.3 pg/mL (ref 2.3–4.2)

## 2021-03-06 LAB — T4, FREE: Free T4: 0.51 ng/dL — ABNORMAL LOW (ref 0.60–1.60)

## 2021-03-07 ENCOUNTER — Telehealth: Payer: Self-pay | Admitting: Family Medicine

## 2021-03-07 ENCOUNTER — Other Ambulatory Visit: Payer: Self-pay | Admitting: Family Medicine

## 2021-03-07 NOTE — Telephone Encounter (Signed)
Pt is calling and would like to speak with nurse. Pt does not remember if she should be taking levothyroxine and simvastatin. Please advise

## 2021-03-08 ENCOUNTER — Telehealth: Payer: Self-pay | Admitting: Family Medicine

## 2021-03-08 MED ORDER — LEVOTHYROXINE SODIUM 25 MCG PO TABS: 25.0000 ug | ORAL_TABLET | Freq: Every day | ORAL | 1 refills | Status: DC

## 2021-03-08 NOTE — Telephone Encounter (Signed)
Spoke with the patient and advised her to take the thyroid medication in the morning with a full glass of water only and not to eat anything or take any other medications until 1 hour later and she agreed.

## 2021-03-08 NOTE — Telephone Encounter (Signed)
Spoke with the patient and informed her of the test results in which Dr Ethlyn Gallery advised to restart thyroid medication.  Also advised the patient she should be taking Simvastatin and the Rx was sent to the pharmacy on 10/13.

## 2021-03-08 NOTE — Addendum Note (Signed)
Addended by: Agnes Lawrence on: 03/08/2021 08:33 AM   Modules accepted: Orders

## 2021-03-08 NOTE — Telephone Encounter (Signed)
Pt is calling and would like to know if she should take levothyroxine in morning or does it matter

## 2021-03-13 ENCOUNTER — Other Ambulatory Visit: Payer: Self-pay

## 2021-03-13 ENCOUNTER — Ambulatory Visit (INDEPENDENT_AMBULATORY_CARE_PROVIDER_SITE_OTHER): Payer: Medicare Other | Admitting: Psychiatry

## 2021-03-13 ENCOUNTER — Encounter: Payer: Self-pay | Admitting: Psychiatry

## 2021-03-13 DIAGNOSIS — F411 Generalized anxiety disorder: Secondary | ICD-10-CM | POA: Diagnosis not present

## 2021-03-13 DIAGNOSIS — F319 Bipolar disorder, unspecified: Secondary | ICD-10-CM

## 2021-03-13 DIAGNOSIS — F4321 Adjustment disorder with depressed mood: Secondary | ICD-10-CM

## 2021-03-13 DIAGNOSIS — D508 Other iron deficiency anemias: Secondary | ICD-10-CM | POA: Diagnosis not present

## 2021-03-13 DIAGNOSIS — F5104 Psychophysiologic insomnia: Secondary | ICD-10-CM | POA: Diagnosis not present

## 2021-03-13 DIAGNOSIS — E039 Hypothyroidism, unspecified: Secondary | ICD-10-CM

## 2021-03-13 DIAGNOSIS — G4721 Circadian rhythm sleep disorder, delayed sleep phase type: Secondary | ICD-10-CM

## 2021-03-13 DIAGNOSIS — R7989 Other specified abnormal findings of blood chemistry: Secondary | ICD-10-CM | POA: Diagnosis not present

## 2021-03-13 DIAGNOSIS — G3184 Mild cognitive impairment, so stated: Secondary | ICD-10-CM | POA: Diagnosis not present

## 2021-03-13 MED ORDER — ZALEPLON 10 MG PO CAPS: 10.0000 mg | ORAL_CAPSULE | Freq: Every evening | ORAL | 0 refills | Status: DC | PRN

## 2021-03-13 NOTE — Patient Instructions (Addendum)
Melatonin 0.5 to 1 mg tablet about 7-8  PM in order to help you to go to sleep around 11:00 or so.  [name] 1 capsule at night as needed for trouble going to sleep

## 2021-03-13 NOTE — Progress Notes (Signed)
Rebecca Novak [id number] 1945-12-25 75 y.o.  Subjective:   Patient ID:  Rebecca Novak is a 75 y.o. (DOB 08-31-1945) female.  Chief Complaint:  Chief Complaint  Patient presents with   Follow-up   Bipolar I disorder    Altered Mental Status    Depression   Anxiety   Memory Loss   Medication Problem   Stress   Sleeping Problem      Medication Refill Associated symptoms include arthralgias. Pertinent negatives include no fatigue or weakness.  Depression        Associated symptoms include decreased concentration.  Associated symptoms include no fatigue, no appetite change and no suicidal ideas.  Past medical history includes anxiety.   Anxiety Symptoms include decreased concentration and nervous/anxious behavior. Patient reports no confusion, dizziness or suicidal ideas.    Rebecca Novak presents to the office today for follow-up of above and bipolar disorder.    Patient called July 19, 2019 reporting that she had inadvertently stopped carbamazepine apparently as much as 6 months ago.  Given that the patient was not notably worse off that medication it was not restarted.  There also had been some question since her last appointment about her consistency with the clonazepam as well.  12/22/2019 appointment the following is noted: Since being here patient called back and has apparently been confused about whether she was taking Equetro or not as she was asking for refill and indicating that she had never stopped it.  So prescription refills were sent. "I misspoke"  Claims has been taking Equetro 400 mg HS. Continued good mood and no complaints.  Really well.  Anxiety is stable.  Weight and appetite better but diet is not good bc no veges or fruits. Plan: no med changes  05/03/20 appt with following noted: Rebecca Novak passed the day after Thanksgiving.  It's a big shock.  Has been staying with [facility] since then.  Will try to stay at her house tonight if she can.   Rebecca Novak  has a will.  Rebecca Novak has made Rebecca Novak her POA and agrees for ROI for [name].  Pt feels she can manage the estate with direction. Rebecca Novak has been supportive. Obviously sad.  Had been doing OK caring for him until he passed peacefully.  She liked caring for him.   Rebecca Novak was cremated.   Otherwise doing OK with meds and not med complaints.  Compliant. Plan: Continue Equetro 400 mg HS Latuda 120 mg  Quetiapine 100 mg 2 tablets at night  05/08/2020 phone call patient complained of insomnia.  Quetiapine was increased to 300 mg nightly  06/13/2020 numerous phone calls.  Per the patient and the sister there was inconsistent information about whether the patient had been compliant or not with carbamazepine and was missing some Latuda as well at times.  Patient seemed confused about her meds.  The nurse spoke with her to try to educate her to the extent possible.  The patient was encouraged to comply with the medications as prescribed.  To get help from her sister if needed.  08/29/2020 appointment with the following noted: Admits missing Latuda bc she didn't see it.  Probably for months. Bottle dated 09/2019 Compliant with Equetro 200 mg 2 at night and Quetiapine increased to 300 mg tablet, 1 at night. Seeking grief counseling.  Really missing Rebecca Novak. Thinks she's done overall OK otherwise.  She's gone back to [name].  Not socializing much.  10/10/2020 appointment with the following noted: Not sure how  this appt got scheduled.  Denies calling and saying she had SI. "Busy and Happy".   Conflict with Rebecca Novak's family trying to insist she pay $100K for the house.  Provided her with letter as requested.    Stressed over it.  Dealing with estate issues.  His son is the executor of the estate.  Rebecca Novak left the house to her but their are loans against it. Plan: Continue Equetro 400 mg HS Latuda Hold bc she's apparently been off for months Quetiapine 300 tablets 1 at night  No med changes indicated.    03/13/2021 appointment with the following noted: Doing real well.  Misses Rebecca Novak but keeps herself busy.  [name] helps and lost weight 162# from 199#.  Treadmill and bike each for 30 mins.  Still living in the same house.   Had to buy the house.  Plans to stay there for a few years.  Feels healthy and had PE.  Only change added Levothyroxine.  Reads at night.  Watches local and national news. Last 2 nights still awake at 4 am.  Melatonin not helping. Not worried about anything. Trouble going to sleep for a month and half. No afternoon caffeine. Go to bed 1130. Out of bed 9 AM Patient reports stable mood and denies depressed or irritable moods.  Patient denies any recent difficulty with anxiety.  . Denies appetite disturbance.  Patient reports that energy and motivation have been good.  Patient denies any difficulty with concentration.  Patient denies any suicidal ideation.  Past Psychiatric Medication Trials: Equetro 1200,  Latuda 120,  lithium no response,  Abilify 20, olanzapine, Geodon 160, gabapentin, lamotrigine 100, Depakote, topiramate Lexapro, sertraline, fluoxetine,duloxetine, Wellbutrin,  trazodone, Ambien, clonazepam, temazepam,  buspirone, Deplin,  Multiple problems. 2  hospitalizations (last 02/23/18) DT confusion and with high CBZ levels, the last was 13.9, but she was also dehydrated.   Dosage reduced now  Review of Systems:  Review of Systems  Constitutional:  Negative for activity change, appetite change, fatigue and unexpected weight change.  Gastrointestinal: Negative.   Musculoskeletal:  Positive for arthralgias.  Neurological:  Negative for dizziness, tremors and weakness.  Psychiatric/Behavioral:  Positive for decreased concentration. Negative for agitation, behavioral problems, confusion, dysphoric mood, hallucinations, self-injury, sleep disturbance and suicidal ideas. The patient is nervous/anxious. The patient is not hyperactive.  Not dizzy.   Medications: I have  reviewed the patient's current medications.  I  Current Outpatient Medications  Medication Sig Dispense Refill   EQUETRO 200 MG CP12 12 hr capsule TAKE 1 CAPSULE EVERY       MORNING AND 2 CAPSULES AT  NIGHT (DISCONTINUE         EQUETRO 300MG ) (Patient taking differently: 2 capsules at night.) 270 capsule 0   levothyroxine (SYNTHROID) 25 MCG tablet Take 1 tablet (25 mcg total) by mouth daily. 90 tablet 1   QUEtiapine (SEROQUEL) 300 MG tablet TAKE 1 TABLET AT BEDTIME 90 tablet 0   SF 5000 PLUS 1.1 % CREA dental cream USE ONCE AT BEDTIME IN PLACE OF REGULAR TOOTHPASTE. BRUSH FOR 2 MINUTES EXPECTORATE. DO NOT RINSE     simvastatin (ZOCOR) 40 MG tablet TAKE 1 TABLET BY MOUTH AT BEDTIME 90 tablet 0   zaleplon (SONATA) 10 MG capsule Take 1 capsule (10 mg total) by mouth at bedtime as needed for sleep. 30 capsule 0   No current facility-administered medications for this visit.    Medication Side Effects: None  Allergies: No Known Allergies  Past Medical History:  Diagnosis Date   ANEMIA, MILD 05/17/2008   BCC (basal cell carcinoma) 08/08/2009   right cheek (CX35FU +exc. 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   upper back (CX35FU)   SCC (squamous cell carcinoma) 11/18/2007   right lower leg (CX35FU)   SCC (squamous cell carcinoma) 03/12/2011   left outer calf   SCC (squamous cell carcinoma) 11/19/2011   left side of chest -tx p bx   SCC (squamous cell carcinoma) 12/08/2012    left chest (CX35FU)   SCC (squamous cell carcinoma) 09/28/2014   left arm inferior (CX35FU), right arm, lateral (CX35FU)   SCC (squamous cell carcinoma) 04/25/2015   rigth forearm    SCC (squamous cell carcinoma) 01/10/2017   right shin proximal (CX35FU)   SCC (squamous cell carcinoma) 04/21/2018   top right hand-tx p bx   SYSTOLIC MURMUR 93/26/7124    Family History  Problem Relation Age of Onset   Dementia Mother    Healthy Father        until death at age 43   Mental illness Brother        not been in contact in years   Healthy Sister     Social History   Socioeconomic History   Marital status: Married    Spouse name: [name]   Number of children: 0   Years of education: Not on file   Highest education level: Not on file  Occupational History   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  Tobacco Use   Smoking status: Former    Packs/day: 0.33    Years: 44.00    Pack years: 14.52    Types: Cigarettes   Smokeless tobacco: Never   Tobacco comments:    02/15/2013 "stopped smoking cigarettes ~ 7 yr ago"  Vaping Use   Vaping Use: Never used  Substance and Sexual Activity   Alcohol use: No   Drug use: No   Sexual activity: Yes  Other Topics Concern   Not on file  Social History Narrative   Not on file   Social Determinants of Health   Financial Resource Strain: Low Risk    Difficulty of Paying Living Expenses: Not hard at all  Food Insecurity: No Food Insecurity   Worried About Charity fundraiser in the Last Year: Never true   [place] in the Last Year: Never true  Transportation Needs: No Transportation Needs   Lack of Transportation (Medical): No   Lack of Transportation (Non-Medical): No  Physical Activity: Inactive   Days of Exercise per Week: 0 days   Minutes of Exercise per Session: 0 min  Stress: No Stress Concern Present   Feeling of Stress : Not at all  Social Connections: Moderately Integrated   Frequency of  Communication with Friends and Family: Three times a week   Frequency of Social Gatherings with Friends and Family: Three times a week   Attends  Religious Services: 1 to 4 times per year   Active Member of Clubs or Organizations: No   Attends Archivist Meetings: Never   Marital Status: Married  Human resources officer Violence: Not At Risk   Fear of Current or Ex-Partner: No   Emotionally Abused: No   Physically Abused: No   Sexually Abused: No    Past Medical History, Surgical history, Social history, and Family history were reviewed and updated as appropriate.   Rebecca Novak was in rehab awhile.  Back home.  Has terminal CA.  He's now dependent on her and she's always been dependent on him in the past.  She doesn't prepare meals.  Please see review of systems for further details on the patient's review from today.   Objective:   Physical Exam:  There were no vitals taken for this visit.  Physical Exam Constitutional:      General: She is not in acute distress.    Appearance: She is well-developed.  Musculoskeletal:        General: No deformity.  Neurological:     Mental Status: She is alert and oriented to person, place, and time.     Motor: No tremor.     Coordination: Coordination normal.     Gait: Gait normal.  Psychiatric:        Attention and Perception: She is attentive. She does not perceive auditory hallucinations.        Mood and Affect: Mood is not anxious or depressed. Affect is not labile, blunt, angry, tearful or inappropriate.        Speech: Speech normal. Speech is not rapid and pressured, delayed or slurred.        Behavior: Behavior normal. Behavior is not agitated, aggressive, withdrawn or hyperactive.        Thought Content: Thought content normal. Thought content is not paranoid or delusional. Thought content does not include homicidal or suicidal ideation. Thought content does not include homicidal or suicidal plan.        Cognition and Memory: Cognition is  impaired. Memory is impaired.        Judgment: Judgment normal.     Comments: Fair  insight and fair judgment. Less easily confused.    MMSE 27/30, recall 1/3, reduced attention on 03/26/18  Lab Review:     Component Value Date/Time   NA 141 12/26/2020 1609   K 4.1 12/26/2020 1609   CL 106 12/26/2020 1609   CO2 25 12/26/2020 1609   GLUCOSE 90 12/26/2020 1609   GLUCOSE 86 05/05/2006 0904   BUN 21 12/26/2020 1609   CREATININE 0.99 12/26/2020 1609   CREATININE 1.03 (H) 01/13/2020 1130   CALCIUM 9.0 12/26/2020 1609   PROT 6.8 12/26/2020 1609   ALBUMIN 4.1 12/26/2020 1609   AST 28 12/26/2020 1609   ALT 20 12/26/2020 1609   ALKPHOS 73 12/26/2020 1609   BILITOT 0.3 12/26/2020 1609   GFRNONAA >60 02/24/2018 0547   GFRAA >60 02/24/2018 0547       Component Value Date/Time   WBC 5.8 12/26/2020 1609   RBC 4.42 12/26/2020 1609   HGB 13.0 12/26/2020 1609   HCT 38.9 12/26/2020 1609   HCT 34.9 01/18/2018 0549   PLT 199.0 12/26/2020 1609   MCV 88.1 12/26/2020 1609   MCH 28.0 01/13/2020 1130   MCHC 33.5 12/26/2020 1609   RDW 13.8 12/26/2020 1609   LYMPHSABS 1.6 12/26/2020 1609   MONOABS 0.5 12/26/2020 1609   EOSABS 0.1 12/26/2020 1609   BASOSABS  0.1 12/26/2020 1609    No results found for: POCLITH, LITHIUM   Lab Results  Component Value Date   CBMZ 6.1 12/15/2018    Never got labs requested in November and reminded at each appt and never got them. .res Assessment: Plan:    Nandini was seen today for follow-up and bipolar i disorder .  Diagnoses and all orders for this visit:  Bipolar I disorder ([place])  Mild cognitive impairment  Chronic insomnia -     zaleplon (SONATA) 10 MG capsule; Take 1 capsule (10 mg total) by mouth at bedtime as needed for sleep.  Grief  Generalized anxiety disorder  Low vitamin D level  Delayed sleep phase syndrome  Iron deficiency anemia secondary to inadequate dietary iron intake  Acquired hypothyroidism   Greater than 50% of  30-minute face to face time with patient was spent on counseling and coordination of care.  There is ongoing concern about whether the patient is going to be able to take care of herself without assistance.  We discussed Extensive discussion about her bipolar disorder which is under relatively fair control at the moment and unusual given the degree of stress that she is experiencing.  She has had previous bouts of delirium with poor self-care and dehydration but she has done a better job of managing this.  She is not having adverse side effects with the medication.  Rebecca Novak died 04/28/2020  Reduced but didn't stop Equetro and mood stability is consistent and tolerated. No mania nor paranoia.  Appears to be tolerating meds.  Sleeping better with increased quetiapine to 300 HS. Reviewed bottles of meds with her. Bring all bottles to appts.  Continue Equetro 400 mg HS Quetiapine 300 tablets 1 at night  [name] 1 capsule at night as needed for trouble going to sleep   Cautioned around SE risk and fall risk.  Disc compliance problems due to her getting confused.  Disc ways to imoprove compliance.  Not ideal for her to be living alone. Discussed importance of her self-care and compliance with medication for her overall mental stability.  Discussed potential metabolic side effects associated with atypical antipsychotics, as well as potential risk for movement side effects. Advised pt to contact office if movement side effects occur.   Disc grief risk and gets Hospice calls every 2 weeks. Manages well.  Extensive discussion around melatonin dosing and timing early evening and low dose. The way she is taking may make it worse. Melatonin 0.5 to 1 mg tablet about 7-8  PM in order to help you to go to sleep around 11:00 or so.  Emphasized adequate diet and fluid intake to prevent further episodes of delirium and orthostatic hypotension.   FU 4 mos  Lynder Parents, MD, DFAPA    Please see After Visit  Summary for patient specific instructions.   Future Appointments  Date Time Provider Rosalie  05/13/2021  1:40 PM [facility]    No orders of the defined types were placed in this encounter.        -------------------------------

## 2021-04-11 ENCOUNTER — Telehealth: Payer: Self-pay | Admitting: Family Medicine

## 2021-04-11 NOTE — Telephone Encounter (Signed)
Patient states she had previously wanted a doctor to call her but she is canceling that request, she no longer needs a call.

## 2021-04-12 ENCOUNTER — Telehealth: Payer: Self-pay | Admitting: Psychiatry

## 2021-04-12 ENCOUNTER — Other Ambulatory Visit: Payer: Self-pay

## 2021-04-12 DIAGNOSIS — F5104 Psychophysiologic insomnia: Secondary | ICD-10-CM

## 2021-04-12 MED ORDER — ZALEPLON 10 MG PO CAPS: 10.0000 mg | ORAL_CAPSULE | Freq: Every evening | ORAL | 0 refills | Status: DC | PRN

## 2021-04-12 NOTE — Telephone Encounter (Signed)
Pended.

## 2021-04-12 NOTE — Telephone Encounter (Signed)
Pt requesting refill for [name] 10 mg for sleep. [place] Apt 2/22

## 2021-04-23 ENCOUNTER — Ambulatory Visit: Payer: Medicare Other

## 2021-05-03 ENCOUNTER — Ambulatory Visit (INDEPENDENT_AMBULATORY_CARE_PROVIDER_SITE_OTHER): Payer: Medicare Other

## 2021-05-03 VITALS — Ht 68.0 in | Wt 166.0 lb

## 2021-05-03 DIAGNOSIS — Z0000 Encounter for general adult medical examination without abnormal findings: Secondary | ICD-10-CM | POA: Diagnosis not present

## 2021-05-03 NOTE — Progress Notes (Signed)
Subjective:   Rebecca Novak is a 75 y.o. female who presents for Medicare Annual (Subsequent) preventive examination.  Review of Systems    No ROS Cardiac Risk Factors include: advanced age (>75men, >60 women)    Objective:    Today's Vitals   05/03/21 1257  Weight: 166 lb (75.3 kg)  Height: 5\' 8"  (1.727 m)   Body mass index is 25.24 kg/m.  Advanced Directives 05/03/2021 04/11/2020 02/24/2018 02/18/2018 02/04/2018 01/19/2018 01/18/2018  Does Patient Have a Medical Advance Directive? Yes Yes Yes Yes Yes Yes Yes  Type of Advance Directive - Living will [place];Living will [place];Living will [place];Living will - [place]  Does patient want to make changes to medical advance directive? No - Patient declined - No - Patient declined - No - Patient declined - No - Patient declined  Copy of [place] in Chart? - - No - copy requested No - copy requested No - copy requested - -  Would patient like information on creating a medical advance directive? - - - - - - -    Current Medications (verified) Outpatient Encounter Medications as of 05/03/2021  Medication Sig   EQUETRO 200 MG CP12 12 hr capsule TAKE 1 CAPSULE EVERY       MORNING AND 2 CAPSULES AT  NIGHT (DISCONTINUE         EQUETRO 300MG ) (Patient taking differently: 2 capsules at night.)   levothyroxine (SYNTHROID) 25 MCG tablet Take 1 tablet (25 mcg total) by mouth daily.   QUEtiapine (SEROQUEL) 300 MG tablet TAKE 1 TABLET AT BEDTIME   SF 5000 PLUS 1.1 % CREA dental cream USE ONCE AT BEDTIME IN PLACE OF REGULAR TOOTHPASTE. BRUSH FOR 2 MINUTES EXPECTORATE. DO NOT RINSE   simvastatin (ZOCOR) 40 MG tablet TAKE 1 TABLET BY MOUTH AT BEDTIME   zaleplon (SONATA) 10 MG capsule Take 1 capsule (10 mg total) by mouth at bedtime as needed for sleep.   [DISCONTINUED] Calcium Carbonate (CALCIUM 500 PO) Take 2 tablets by mouth daily.     No  facility-administered encounter medications on file as of 05/03/2021.    Allergies (verified) Patient has no known allergies.   History: Past Medical History:  Diagnosis Date   ANEMIA, MILD 05/17/2008   BCC (basal cell carcinoma) 08/08/2009   right cheek (CX35FU +exc. 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   upper back (CX35FU)   SCC (squamous cell carcinoma) 11/18/2007   right lower leg (CX35FU)   SCC (squamous cell carcinoma) 03/12/2011   left outer calf   SCC (squamous cell carcinoma) 11/19/2011   left side of chest -tx p bx   SCC (squamous cell carcinoma) 12/08/2012   left chest (CX35FU)   SCC (squamous cell carcinoma) 09/28/2014   left arm inferior (CX35FU), right arm, lateral (CX35FU)   SCC (squamous cell carcinoma) 04/25/2015   rigth forearm  SCC (squamous cell carcinoma) 01/10/2017   right shin proximal (CX35FU)   SCC (squamous cell carcinoma) 04/21/2018   top right hand-tx p bx   SYSTOLIC MURMUR 93/79/0240   Past Surgical History:  Procedure Laterality Date   APPENDECTOMY     TONSILLECTOMY     VAGINAL HYSTERECTOMY     uncertain reason.    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  Tobacco Use   Smoking status: Former    Packs/day: 0.33    Years: 44.00    Pack years: 14.52    Types: Cigarettes   Smokeless tobacco: Never   Tobacco comments:    02/15/2013 "stopped smoking cigarettes ~ 7 yr ago"  Vaping Use   Vaping Use: Never used  Substance and Sexual Activity   Alcohol use: No   Drug use: No   Sexual activity: Yes  Other Topics Concern   Not on file  Social History Narrative   Not on file   Social Determinants of Health   Financial Resource Strain: Low Risk    Difficulty of Paying Living Expenses: Not hard at all  Food Insecurity: No Food Insecurity   Worried About Charity fundraiser in the Last Year: Never true   [place] in the Last Year: Never true  Transportation Needs: No Transportation Needs   Lack of Transportation (Medical): No   Lack of Transportation (Non-Medical): No  Physical Activity: Sufficiently Active   Days of Exercise per Week: 7 days   Minutes of Exercise per Session: 60 min  Stress: No Stress Concern Present   Feeling of Stress : Not at all  Social Connections: Moderately Integrated   Frequency of Communication with Friends and Family: More than three times a week   Frequency of Social Gatherings with Friends and Family: More than three times a week   Attends Religious Services: More than 4 times per year   Active Member of [employer] or Organizations: Yes   Attends Archivist Meetings: More than 4 times per year   Marital Status: Widowed    Clinical Intake:  Diabetic? No  Interpreter Needed?: NoActivities of Daily Living In your present state of health, do you have any difficulty performing the following activities: 05/03/2021  Hearing? N  Vision? N  Difficulty concentrating or making decisions? N  Dressing or bathing? N  Doing  errands, shopping? N  Preparing Food and eating ? N  Using the Toilet? N  In the past six months, have you accidently leaked urine? N  Do you have problems with loss of bowel control? N  Managing your Medications? N  Managing your Finances? N  Housekeeping or managing your Housekeeping? N  Some recent data might be hidden    Patient Care Team: Caren Macadam, MD as PCP - General (Family Medicine) Sherren Mocha, MD as PCP - Cardiology (Cardiology) Cottle, Billey Co., MD  as Attending Physician (Psychiatry) Viona Gilmore, [facility] as Pharmacist (Pharmacist) Lavonna Monarch, MD as Consulting Physician (Dermatology)  Indicate any recent Medical Services you may have received from other than [facility] providers in the past year (date may be approximate).     Assessment:   This is a routine wellness examination for [facility].  Virtual Visit via Telephone Note  I connected with  Rebecca Novak on 05/03/21 at  1:00 PM EST by telephone and verified that I am speaking with the correct person using two identifiers.  Location: Patient: Home Provider: Office Persons participating in the virtual visit: patient/Nurse Health Advisor   I discussed the limitations, risks, security and privacy concerns of performing an evaluation and management service by telephone and the availability of in person appointments. The patient expressed understanding and agreed to proceed.  Interactive audio and video telecommunications were attempted between this nurse and patient, however failed, due to patient having technical difficulties OR patient did not have access to video capability.  We continued and completed visit with audio only.  Some vital signs may be absent or patient reported.   Criselda Peaches, LPN   Hearing/Vision screen Hearing Screening - Comments:: No difficulty hearing Vision Screening - Comments:: Wears reading glasses. Followed by Dr Katy Fitch  Dietary issues and exercise  activities discussed: Current Exercise Habits: Home exercise routine, Type of exercise: walking, Time (Minutes): 60, Frequency (Times/Week): 7, Weekly Exercise (Minutes/Week): 420, Intensity: Moderate   Goals Addressed             This Visit's Progress    Exercise 150 min/wk Moderate Activity       Will start walking in the neighborhood. In the early afternoon.       Depression Screen PHQ 2/9 Scores 05/03/2021 04/11/2020 02/22/2018 02/04/2018 08/05/2017 06/26/2017 04/07/2016  PHQ - 2 Score 0 0 0 0 0 0 0  PHQ- 9 Score - - - - - 0 -    Fall Risk Fall Risk  05/03/2021 04/11/2020 02/18/2018 08/05/2017 04/30/2017  Falls in the past year? 0 0 Yes No No  Comment - - - - Emmi Telephone Survey: data to providers prior to load  Number falls in past yr: 0 0 2 or more - -  Injury with Fall? 0 0 No - -  Risk Factor Category  - - High Fall Risk - -  Risk for fall due to : - Impaired vision History of fall(s);Impaired balance/gait - -  Follow up - Falls prevention discussed - - -    FALL RISK PREVENTION PERTAINING TO THE HOME:  Any stairs in or around the home? Yes  If so, are there any without handrails? Yes  Home free of loose throw rugs in walkways, pet beds, electrical cords, etc? Yes  Adequate lighting in your home to reduce risk of falls? Yes   ASSISTIVE DEVICES UTILIZED TO PREVENT FALLS:  Life alert? No  Use of a cane, walker or w/c? No  Grab bars in the bathroom? Yes  Shower chair or bench in shower? Yes  Elevated toilet seat or a handicapped toilet? No   TIMED UP AND GO:  Was the test performed? No . Audio Visit  Cognitive Function: MMSE - Mini Mental State Exam 08/05/2017  Not completed: (No Data)  Some encounter information is confidential and restricted. Go to Review Flowsheets activity to see all data.     6CIT Screen 05/03/2021 04/11/2020  What Year? 0 points 0 points  What month? 0 points 0  points  What time? 0 points -  Count back from 20 0 points 0 points  Months  in reverse 0 points 4 points  Repeat phrase 0 points 0 points  Total Score 0 -    Immunizations Immunization History  Administered Date(s) Administered   Fluad Quad(high Dose 65+) 03/23/2019   Influenza Split 03/06/2011, 03/16/2012   Influenza Whole 03/05/2007, 03/07/2008, 02/22/2009, 02/14/2010   Influenza, High Dose Seasonal PF 03/06/2015, 03/17/2016, 02/26/2017, 03/04/2018   Influenza,inj,Quad PF,6+ Mos 03/18/2013, 03/23/2014   Influenza,inj,quad, With Preservative 02/23/2018   Influenza-Unspecified 03/05/2020   PFIZER(Purple Top)SARS-COV-2 Vaccination 07/17/2019, 08/10/2019   Pneumococcal Conjugate-13 02/13/2014   Pneumococcal Polysaccharide-23 12/18/2010   Td 11/08/2008   Tdap 01/04/2012   Zoster, Live 05/16/2015    Flu Vaccine status: Due, Education has been provided regarding the importance of this vaccine. Advised may receive this vaccine at local pharmacy or Health Dept. Aware to provide a copy of the vaccination record if obtained from local pharmacy or Health Dept. Verbalized acceptance and understanding.  Pneumococcal vaccine status: Up to date  Covid-19 vaccine status: Information provided on how to obtain vaccines.   Qualifies for Shingles Vaccine? Yes   Zostavax completed No   Shingrix Completed?: No.    Education has been provided regarding the importance of this vaccine. Patient has been advised to call insurance company to determine out of pocket expense if they have not yet received this vaccine. Advised may also receive vaccine at local pharmacy or Health Dept. Verbalized acceptance and understanding.  Screening Tests Health Maintenance  Topic Date Due   COVID-19 Vaccine (3 - Pfizer risk series) 05/19/2021 (Originally 09/07/2019)   Zoster Vaccines- Shingrix (1 of 2) 08/01/2021 (Originally 11/10/1964)   INFLUENZA VACCINE  08/23/2021 (Originally 12/24/2020)   COLONOSCOPY (Pts 45-94yrs Insurance coverage will need to be confirmed)  05/03/2022 (Originally  05/14/2018)   TETANUS/TDAP  01/03/2022   Pneumonia Vaccine 7+ Years old  Completed   DEXA SCAN  Completed   Hepatitis C Screening  Completed   HPV VACCINES  Aged Out    Health Maintenance  There are no preventive care reminders to display for this patient.   Additional Screening:   Vision Screening: Recommended annual ophthalmology exams for early detection of glaucoma and other disorders of the eye. Is the patient up to date with their annual eye exam?  Yes  Who is the provider or what is the name of the office in which the patient attends annual eye exams? Followed by Dr Katy Fitch   Dental Screening: Recommended annual dental exams for proper oral hygiene  Community Resource Referral / Chronic Care Management:  CRR required this visit?  No   CCM required this visit?  No      Plan:     I have personally reviewed and noted the following in the patient's chart:   Medical and social history Use of alcohol, tobacco or illicit drugs  Current medications and supplements including opioid prescriptions. Patient currently not taking opioids. Functional ability and status Nutritional status Physical activity Advanced directives List of other physicians Hospitalizations, surgeries, and ER visits in previous 12 months Vitals Screenings to include cognitive, depression, and falls Referrals and appointments  In addition, I have reviewed and discussed with patient certain preventive protocols, quality metrics, and best practice recommendations. A written personalized care plan for preventive services as well as general preventive health recommendations were provided to patient.     Criselda Peaches, LPN   18/06/9935

## 2021-05-03 NOTE — Patient Instructions (Addendum)
Rebecca Novak , Thank you for taking time to come for your Medicare Wellness Visit. I appreciate your ongoing commitment to your health goals. Please review the following plan we discussed and let me know if I can assist you in the future.   These are the goals we discussed:  Goals      Exercise 150 min/wk Moderate Activity     Will start walking in the neighborhood. In the early afternoon.     Patient Stated     Stay healthy        This is a list of the screening recommended for you and due dates:  Health Maintenance  Topic Date Due   COVID-19 Vaccine (3 - Pfizer risk series) 05/19/2021*   Zoster (Shingles) Vaccine (1 of 2) 08/01/2021*   Flu Shot  08/23/2021*   Colon Cancer Screening  05/03/2022*   Tetanus Vaccine  01/03/2022   Pneumonia Vaccine  Completed   DEXA scan (bone density measurement)  Completed   Hepatitis C Screening: USPSTF Recommendation to screen - Ages 10-79 yo.  Completed   HPV Vaccine  Aged Out  *Topic was postponed. The date shown is not the original due date.    Advanced directives: Yes   Conditions/risks identified: None  Next appointment: Follow up in one year for your annual wellness visit    Preventive Care 65 Years and Older, Female Preventive care refers to lifestyle choices and visits with your health care provider that can promote health and wellness. What does preventive care include? A yearly physical exam. This is also called an annual well check. Dental exams once or twice a year. Routine eye exams. Ask your health care provider how often you should have your eyes checked. Personal lifestyle choices, including: Daily care of your teeth and gums. Regular physical activity. Eating a healthy diet. Avoiding tobacco and drug use. Limiting alcohol use. Practicing safe sex. Taking low-dose aspirin every day. Taking vitamin and mineral supplements as recommended by your health care provider. What happens during an annual well check? The  services and screenings done by your health care provider during your annual well check will depend on your age, overall health, lifestyle risk factors, and family history of disease. Counseling  Your health care provider may ask you questions about your: Alcohol use. Tobacco use. Drug use. Emotional well-being. Home and relationship well-being. Sexual activity. Eating habits. History of falls. Memory and ability to understand (cognition). Work and work Statistician. Reproductive health. Screening  You may have the following tests or measurements: Height, weight, and BMI. Blood pressure. Lipid and cholesterol levels. These may be checked every 5 years, or more frequently if you are over 14 years old. Skin check. Lung cancer screening. You may have this screening every year starting at age 33 if you have a 30-pack-year history of smoking and currently smoke or have quit within the past 15 years. Fecal occult blood test (FOBT) of the stool. You may have this test every year starting at age 67. Flexible sigmoidoscopy or colonoscopy. You may have a sigmoidoscopy every 5 years or a colonoscopy every 10 years starting at age 97. Hepatitis C blood test. Hepatitis B blood test. Sexually transmitted disease (STD) testing. Diabetes screening. This is done by checking your blood sugar (glucose) after you have not eaten for a while (fasting). You may have this done every 1-3 years. Bone density scan. This is done to screen for osteoporosis. You may have this done starting at age 74. Mammogram. This may  be done every 1-2 years. Talk to your health care provider about how often you should have regular mammograms. Talk with your health care provider about your test results, treatment options, and if necessary, the need for more tests. Vaccines  Your health care provider may recommend certain vaccines, such as: Influenza vaccine. This is recommended every year. Tetanus, diphtheria, and acellular  pertussis (Tdap, Td) vaccine. You may need a Td booster every 10 years. Zoster vaccine. You may need this after age 37. Pneumococcal 13-valent conjugate (PCV13) vaccine. One dose is recommended after age 58. Pneumococcal polysaccharide (PPSV23) vaccine. One dose is recommended after age 8. Talk to your health care provider about which screenings and vaccines you need and how often you need them. This information is not intended to replace advice given to you by your health care provider. Make sure you discuss any questions you have with your health care provider. Document Released: 06/08/2015 Document Revised: 01/30/2016 Document Reviewed: 03/13/2015 Elsevier Interactive Patient Education  2017 [place] Prevention in the Home Falls can cause injuries. They can happen to people of all ages. There are many things you can do to make your home safe and to help prevent falls. What can I do on the outside of my home? Regularly fix the edges of walkways and driveways and fix any cracks. Remove anything that might make you trip as you walk through a door, such as a raised step or threshold. Trim any bushes or trees on the path to your home. Use bright outdoor lighting. Clear any walking paths of anything that might make someone trip, such as rocks or tools. Regularly check to see if handrails are loose or broken. Make sure that both sides of any steps have handrails. Any raised decks and porches should have guardrails on the edges. Have any leaves, snow, or ice cleared regularly. Use sand or salt on walking paths during winter. Clean up any spills in your garage right away. This includes oil or grease spills. What can I do in the bathroom? Use night lights. Install grab bars by the toilet and in the tub and shower. Do not use towel bars as grab bars. Use non-skid mats or decals in the tub or shower. If you need to sit down in the shower, use a plastic, non-slip stool. Keep the floor  dry. Clean up any water that spills on the floor as soon as it happens. Remove soap buildup in the tub or shower regularly. Attach bath mats securely with double-sided non-slip rug tape. Do not have throw rugs and other things on the floor that can make you trip. What can I do in the bedroom? Use night lights. Make sure that you have a light by your bed that is easy to reach. Do not use any sheets or blankets that are too big for your bed. They should not hang down onto the floor. Have a firm chair that has side arms. You can use this for support while you get dressed. Do not have throw rugs and other things on the floor that can make you trip. What can I do in the kitchen? Clean up any spills right away. Avoid walking on wet floors. Keep items that you use a lot in easy-to-reach places. If you need to reach something above you, use a strong step stool that has a grab bar. Keep electrical cords out of the way. Do not use floor polish or wax that makes floors slippery. If you must use  wax, use non-skid floor wax. Do not have throw rugs and other things on the floor that can make you trip. What can I do with my stairs? Do not leave any items on the stairs. Make sure that there are handrails on both sides of the stairs and use them. Fix handrails that are broken or loose. Make sure that handrails are as long as the stairways. Check any carpeting to make sure that it is firmly attached to the stairs. Fix any carpet that is loose or worn. Avoid having throw rugs at the top or bottom of the stairs. If you do have throw rugs, attach them to the floor with carpet tape. Make sure that you have a light switch at the top of the stairs and the bottom of the stairs. If you do not have them, ask someone to add them for you. What else can I do to help prevent falls? Wear shoes that: Do not have high heels. Have rubber bottoms. Are comfortable and fit you well. Are closed at the toe. Do not wear  sandals. If you use a stepladder: Make sure that it is fully opened. Do not climb a closed stepladder. Make sure that both sides of the stepladder are locked into place. Ask someone to hold it for you, if possible. Clearly mark and make sure that you can see: Any grab bars or handrails. First and last steps. Where the edge of each step is. Use tools that help you move around (mobility aids) if they are needed. These include: Canes. Walkers. Scooters. Crutches. Turn on the lights when you go into a dark area. Replace any light bulbs as soon as they burn out. Set up your furniture so you have a clear path. Avoid moving your furniture around. If any of your floors are uneven, fix them. If there are any pets around you, be aware of where they are. Review your medicines with your doctor. Some medicines can make you feel dizzy. This can increase your chance of falling. Ask your doctor what other things that you can do to help prevent falls. This information is not intended to replace advice given to you by your health care provider. Make sure you discuss any questions you have with your health care provider. Document Released: 03/08/2009 Document Revised: 10/18/2015 Document Reviewed: 06/16/2014 Elsevier Interactive Patient Education  2017 [employer].

## 2021-05-08 ENCOUNTER — Telehealth: Payer: Self-pay

## 2021-05-08 NOTE — Telephone Encounter (Signed)
Pt is calling again and would like Rebecca Novak to call her back before 3 pm .Pt is calling to ask Rebecca Novak a question about her appt on 05-13-2021 for tsh. Pt states she already had her tsh check

## 2021-05-08 NOTE — Telephone Encounter (Signed)
Patient called asking for a call back from Dr. Berenice Bouton Cma no information given

## 2021-05-08 NOTE — Telephone Encounter (Signed)
Patient called again to say that she has an appointment this afternoon and will need a call before 3:15 today (12/14) from Wendie Simmer. I let her know I could send the message back but I couldn't guarantee a call before then. Patient verbalized understanding

## 2021-05-08 NOTE — Telephone Encounter (Signed)
Patient called again asking if Rebecca Novak could give her a call regarding an appointment she has scheduled after 9am tomorrow morning (12/15). Informed patient that Rebecca Novak has been seeing patients and would give her a call when she could. Patient verbalized understanding and asked again if she could give her a call.  Patient can be reached at [phone].

## 2021-05-08 NOTE — Telephone Encounter (Signed)
No answer at the patient's home number. 

## 2021-05-09 ENCOUNTER — Telehealth: Payer: Self-pay | Admitting: Family Medicine

## 2021-05-09 NOTE — Telephone Encounter (Signed)
Patient stated she has asked for Mechele Claude to call her, however she now remembers why she was having to get a thyroid test done.

## 2021-05-09 NOTE — Telephone Encounter (Signed)
No answer at the patient's home number. 

## 2021-05-10 NOTE — Telephone Encounter (Signed)
See prior phone note. 

## 2021-05-10 NOTE — Telephone Encounter (Signed)
Spoke with the Rebecca Novak to remind her after the labs done in October, that Dr Ethlyn Gallery recommended repeating labs in 2 months.  Rebecca Novak stated she did recall this and called our office to cancel the message that was sent previously.

## 2021-05-13 ENCOUNTER — Other Ambulatory Visit: Payer: Medicare Other

## 2021-05-16 ENCOUNTER — Other Ambulatory Visit: Payer: Self-pay | Admitting: Psychiatry

## 2021-05-16 ENCOUNTER — Telehealth: Payer: Self-pay | Admitting: Psychiatry

## 2021-05-16 DIAGNOSIS — F5104 Psychophysiologic insomnia: Secondary | ICD-10-CM

## 2021-05-16 DIAGNOSIS — F319 Bipolar disorder, unspecified: Secondary | ICD-10-CM

## 2021-05-16 NOTE — Telephone Encounter (Signed)
Pt called and needs a refill on her seroquel 300 mg sent to [facility]

## 2021-05-16 NOTE — Telephone Encounter (Signed)
Rx sent 

## 2021-05-23 ENCOUNTER — Other Ambulatory Visit: Payer: Medicare Other

## 2021-06-17 ENCOUNTER — Telehealth: Payer: Self-pay | Admitting: Family Medicine

## 2021-06-17 NOTE — Telephone Encounter (Signed)
Rx previously sent by PCP.

## 2021-06-17 NOTE — Telephone Encounter (Signed)
Patient called to get refill on simvastatin (ZOCOR) 40 MG tablet Patient is completely out   Please send to  [place], [place] - [address] Phone:  [phone]  Fax:  [phone]       Please advise

## 2021-06-25 ENCOUNTER — Telehealth: Payer: Self-pay | Admitting: Psychiatry

## 2021-06-25 ENCOUNTER — Other Ambulatory Visit: Payer: Self-pay

## 2021-06-25 DIAGNOSIS — F5104 Psychophysiologic insomnia: Secondary | ICD-10-CM

## 2021-06-25 MED ORDER — ZALEPLON 10 MG PO CAPS: 10.0000 mg | ORAL_CAPSULE | Freq: Every evening | ORAL | 0 refills | Status: DC | PRN

## 2021-06-25 NOTE — Telephone Encounter (Signed)
Next appt is 07/17/21. Requesting refill on Xaleplon 10 mg called to:  [place], [place] [address]  Phone:  [phone]  Fax:  [phone]

## 2021-06-25 NOTE — Telephone Encounter (Signed)
pended

## 2021-06-27 ENCOUNTER — Telehealth: Payer: Self-pay | Admitting: Psychiatry

## 2021-06-27 NOTE — Telephone Encounter (Signed)
She took lithium in the past and it did not help her.  She has an appointment in 3 weeks.  Encourage her to stay on the Equetro until then and I will discuss some other options with her at that time.

## 2021-06-27 NOTE — Telephone Encounter (Signed)
Please review

## 2021-06-27 NOTE — Telephone Encounter (Signed)
Patient Rebecca Novak inquiring if Dr Clovis Pu would substitute Lithium Carbonate for the [facility]. Pharmacy suggested this would be a good substitution since patient is concerned with price. Please advise # [phone].

## 2021-06-28 NOTE — Telephone Encounter (Signed)
Pt informed

## 2021-07-09 DIAGNOSIS — Z86010 Personal history of colonic polyps: Secondary | ICD-10-CM | POA: Diagnosis not present

## 2021-07-09 DIAGNOSIS — K5901 Slow transit constipation: Secondary | ICD-10-CM | POA: Diagnosis not present

## 2021-07-17 ENCOUNTER — Other Ambulatory Visit: Payer: Self-pay

## 2021-07-17 ENCOUNTER — Ambulatory Visit (INDEPENDENT_AMBULATORY_CARE_PROVIDER_SITE_OTHER): Payer: Medicare Other | Admitting: Psychiatry

## 2021-07-17 ENCOUNTER — Encounter: Payer: Self-pay | Admitting: Psychiatry

## 2021-07-17 DIAGNOSIS — G4721 Circadian rhythm sleep disorder, delayed sleep phase type: Secondary | ICD-10-CM | POA: Diagnosis not present

## 2021-07-17 DIAGNOSIS — F5104 Psychophysiologic insomnia: Secondary | ICD-10-CM

## 2021-07-17 DIAGNOSIS — R7989 Other specified abnormal findings of blood chemistry: Secondary | ICD-10-CM | POA: Diagnosis not present

## 2021-07-17 DIAGNOSIS — G3184 Mild cognitive impairment, so stated: Secondary | ICD-10-CM | POA: Diagnosis not present

## 2021-07-17 DIAGNOSIS — F411 Generalized anxiety disorder: Secondary | ICD-10-CM

## 2021-07-17 DIAGNOSIS — F319 Bipolar disorder, unspecified: Secondary | ICD-10-CM | POA: Diagnosis not present

## 2021-07-17 DIAGNOSIS — F4321 Adjustment disorder with depressed mood: Secondary | ICD-10-CM | POA: Diagnosis not present

## 2021-07-17 MED ORDER — CARBAMAZEPINE ER 200 MG PO CP12: 400.0000 mg | ORAL_CAPSULE | Freq: Every evening | ORAL | 0 refills | Status: DC

## 2021-07-17 MED ORDER — QUETIAPINE FUMARATE 300 MG PO TABS: 300.0000 mg | ORAL_TABLET | Freq: Every day | ORAL | 0 refills | Status: DC

## 2021-07-17 MED ORDER — ZALEPLON 10 MG PO CAPS: 10.0000 mg | ORAL_CAPSULE | Freq: Every evening | ORAL | 2 refills | Status: DC | PRN

## 2021-07-17 NOTE — Progress Notes (Signed)
Rebecca Novak [id number] [birth year] 76 y.o.  Subjective:   Patient ID:  Rebecca Novak is a 76 y.o. (DOB [birth year]) female.  Chief Complaint:  Chief Complaint  Patient presents with   Follow-up    Bipolar I disorder ([place])   Depression   Anxiety   Stress   Sleeping Problem   Medication Problem   Altered Mental Status    Depression   Anxiety   Memory Loss   Medication Problem   Stress   Sleeping Problem      Medication Refill Associated symptoms include arthralgias. Pertinent negatives include no fatigue or weakness.  Depression        Associated symptoms include decreased concentration.  Associated symptoms include no fatigue, no appetite change and no suicidal ideas.  Past medical history includes anxiety.   Anxiety Symptoms include decreased concentration. Patient reports no confusion, dizziness, nervous/anxious behavior or suicidal ideas.    Rebecca Novak presents to the office today for follow-up of above and bipolar disorder.    Patient called July 19, 2019 reporting that she had inadvertently stopped carbamazepine apparently as much as 6 months ago.  Given that the patient was not notably worse off that medication it was not restarted.  There also had been some question since her last appointment about her consistency with the clonazepam as well.  12/22/2019 appointment the following is noted: Since being here patient called back and has apparently been confused about whether she was taking Equetro or not as she was asking for refill and indicating that she had never stopped it.  So prescription refills were sent. "I misspoke"  Claims has been taking Equetro 400 mg HS. Continued good mood and no complaints.  Really well.  Anxiety is stable.  Weight and appetite better but diet is not good bc no veges or fruits. Plan: no med changes  05/03/20 appt with following noted: Rebecca Novak passed the day after Thanksgiving.  It's a big shock.  Has been staying  with [facility] since then.  Will try to stay at her house tonight if she can.   Rebecca Novak has a will.  Rebecca Novak has made Rebecca Novak her POA and agrees for ROI for [name].  Pt feels she can manage the estate with direction. Rebecca Novak has been supportive. Obviously sad.  Had been doing OK caring for him until he passed peacefully.  She liked caring for him.   Rebecca Novak was cremated.   Otherwise doing OK with meds and not med complaints.  Compliant. Plan: Continue Equetro 400 mg HS Latuda 120 mg  Quetiapine 100 mg 2 tablets at night  05/08/2020 phone call patient complained of insomnia.  Quetiapine was increased to 300 mg nightly  06/13/2020 numerous phone calls.  Per the patient and the sister there was inconsistent information about whether the patient had been compliant or not with carbamazepine and was missing some Latuda as well at times.  Patient seemed confused about her meds.  The nurse spoke with her to try to educate her to the extent possible.  The patient was encouraged to comply with the medications as prescribed.  To get help from her sister if needed.  08/29/2020 appointment with the following noted: Admits missing Latuda bc she didn't see it.  Probably for months. Bottle dated 09/2019 Compliant with Equetro 200 mg 2 at night and Quetiapine increased to 300 mg tablet, 1 at night. Seeking grief counseling.  Really missing Rebecca Novak. Thinks she's done overall OK otherwise.  She's gone back to [employer] weekly.  Not socializing much.  10/10/2020 appointment with the following noted: Not sure how this appt got scheduled.  Denies calling and saying she had SI. "Busy and Happy".   Conflict with [name] trying to insist she pay $100K for the house.  Provided her with letter as requested.    Stressed over it.  Dealing with estate issues.  His son is the executor of the estate.  [name] left the house to her but their are loans against it. Plan: Continue Equetro 400 mg HS Latuda Hold bc she's apparently been off  for months Quetiapine 300 tablets 1 at night  No med changes indicated.   03/13/2021 appointment with the following noted: Doing real well.  Misses Rebecca Novak but keeps herself busy.  [name] helps and lost weight 162# from 199#.  Treadmill and bike each for 30 mins.  Still living in the same house.   Had to buy the house.  Plans to stay there for a few years.  Feels healthy and had PE.  Only change added Levothyroxine.  Reads at night.  Watches local and national news. Last 2 nights still awake at 4 am.  Melatonin not helping. Not worried about anything. Trouble going to sleep for a month and half. No afternoon caffeine. Go to bed 1130. Out of bed 9 AM Patient reports stable mood and denies depressed or irritable moods.  Patient denies any recent difficulty with anxiety.  . Denies appetite disturbance.  Patient reports that energy and motivation have been good.  Patient denies any difficulty with concentration.  Patient denies any suicidal ideation.  06/27/2021 phone call requesting to substitute lithium for Equetro because of the cost of Equetro.  Chart review says she took lithium in the past with poor response.  We will explore alternatives at the next appointment.  07/17/2021 appointment with the following noted: Equetro 400 mg nightly, quetiapine 300 mg nightly, Sonata as needed insomnia Doing real well overall.  Stays busy and church every Sunday.  Involved with Women's group for widows.   Enjoys [facility]) women's group. [facility] [employer] [facility] a [facility].  Made friends.  Someone calls and checks on her daily. Rebecca Novak lives in [place] 76 yo.  She decided pt needs to be closer to her.  Rebecca Novak may move further away. Needs something for sleep nightly and concerns [name] is $17 Patient reports stable mood and denies depressed or irritable moods.  Patient denies any recent difficulty with anxiety.  Denies appetite disturbance.  Patient reports that energy and motivation have been good.   Patient denies any difficulty with concentration.  Patient denies any suicidal ideation.    Past Psychiatric Medication Trials: Equetro 1200,  Latuda 120,  lithium no response,  Abilify 20, olanzapine, Geodon 160, gabapentin, lamotrigine 100, Depakote, topiramate Lexapro, sertraline, fluoxetine,duloxetine, Wellbutrin,  trazodone, Ambien, clonazepam, temazepam,  buspirone, Deplin,  Multiple problems. 2  hospitalizations (last 02/23/18) DT confusion and with high CBZ levels, the last was 13.9, but she was also dehydrated.   Dosage reduced now  Review of Systems:  Review of Systems  Constitutional:  Negative for activity change, appetite change, fatigue and unexpected weight change.  Gastrointestinal: Negative.   Musculoskeletal:  Positive for arthralgias.  Neurological:  Negative for dizziness, tremors and weakness.  Psychiatric/Behavioral:  Positive for decreased concentration. Negative for agitation, behavioral problems, confusion, dysphoric mood, hallucinations, self-injury, sleep disturbance and suicidal ideas. The patient is not nervous/anxious and is not hyperactive.  Not  dizzy.   Medications: I have reviewed the patient's current medications.  I  Current Outpatient Medications  Medication Sig Dispense Refill   EQUETRO 200 MG CP12 12 hr capsule TAKE 1 CAPSULE EVERY       MORNING AND 2 CAPSULES AT  NIGHT (DISCONTINUE         EQUETRO 300MG ) (Patient taking differently: 2 capsules at night.) 270 capsule 0   levothyroxine (SYNTHROID) 25 MCG tablet Take 1 tablet (25 mcg total) by mouth daily. 90 tablet 1   QUEtiapine (SEROQUEL) 300 MG tablet TAKE 1 TABLET AT BEDTIME 90 tablet 0   SF 5000 PLUS 1.1 % CREA dental cream USE ONCE AT BEDTIME IN PLACE OF REGULAR TOOTHPASTE. BRUSH FOR 2 MINUTES EXPECTORATE. DO NOT RINSE     simvastatin (ZOCOR) 40 MG tablet TAKE 1 TABLET BY MOUTH AT BEDTIME . APPOINTMENT REQUIRED FOR FUTURE REFILLS 90 tablet 1   zaleplon (SONATA) 10 MG capsule Take 1 capsule (10  mg total) by mouth at bedtime as needed for sleep. (Patient not taking: Reported on 07/17/2021) 30 capsule 0   No current facility-administered medications for this visit.    Medication Side Effects: None  Allergies: No Known Allergies  Past Medical History:  Diagnosis Date   ANEMIA, MILD 05/17/2008   BCC (basal cell carcinoma) 08/08/2009   right cheek (CX35FU +exc. 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   upper back (CX35FU)   SCC (squamous cell carcinoma) 11/18/2007   right lower leg (CX35FU)   SCC (squamous cell carcinoma) 03/12/2011   left outer calf   SCC (squamous cell carcinoma) 11/19/2011   left side of chest -tx p bx   SCC (squamous cell carcinoma) 12/08/2012   left chest (CX35FU)   SCC (squamous cell carcinoma) 09/28/2014   left arm inferior (CX35FU), right arm, lateral (CX35FU)   SCC (squamous cell carcinoma) 04/25/2015   rigth forearm    SCC (squamous cell carcinoma) 01/10/2017   right shin proximal (CX35FU)   SCC (squamous cell carcinoma) 04/21/2018   top right hand-tx p bx   SYSTOLIC MURMUR 32/35/5732    Family History  Problem Relation Age of Onset   Dementia Mother    Healthy Father        until death at age 28   Mental illness Brother        not been in contact in years   Healthy Sister     Social History   Socioeconomic History   Marital status: Married    Spouse name: Rebecca Novak   Number of children: 0   Years of education: Not on  file   Highest education level: Not on file  Occupational History   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  Tobacco Use   Smoking status: Former    Packs/day: 0.33    Years: 44.00    Pack years: 14.52    Types: Cigarettes   Smokeless tobacco: Never  Tobacco comments:    02/15/2013 "stopped smoking cigarettes ~ 7 yr ago"  Vaping Use   Vaping Use: Never used  Substance and Sexual Activity   Alcohol use: No   Drug use: No   Sexual activity: Yes  Other Topics Concern   Not on file  Social History Narrative   Not on file   Social Determinants of Health   Financial Resource Strain: Low Risk    Difficulty of Paying Living Expenses: Not hard at all  Food Insecurity: No Food Insecurity   Worried About Charity fundraiser in the Last Year: Never true   [place] in the Last Year: Never true  Transportation Needs: No Transportation Needs   Lack of Transportation (Medical): No   Lack of Transportation (Non-Medical): No  Physical Activity: Sufficiently Active   Days of Exercise per Week: 7 days   Minutes of Exercise per Session: 60 min  Stress: No Stress Concern Present   Feeling of Stress : Not at all  Social Connections: Moderately Integrated   Frequency of Communication with Friends and Family: More than three times a week   Frequency of Social Gatherings with Friends and Family: More than three times a week   Attends Religious Services: More than 4 times per year   Active Member of [employer] or Organizations: Yes   Attends Archivist Meetings: More than 4 times per year   Marital Status: Widowed  Human resources officer Violence: Not At Risk   Fear of Current or Ex-Partner: No   Emotionally Abused: No   Physically Abused: No   Sexually Abused: No    Past Medical History, Surgical history, Social history, and Family history were reviewed and updated as appropriate.   Rebecca Novak was in rehab awhile.  Back home.  Has terminal CA.  He's now dependent on  her and she's always been dependent on him in the past.  She doesn't prepare meals.  Please see review of systems for further details on the patient's review from today.   Objective:   Physical Exam:  There were no vitals taken for this visit.  Physical Exam Constitutional:      General: She is not in acute distress.    Appearance: She is well-developed.  Musculoskeletal:        General: No deformity.  Neurological:     Mental Status: She is alert and oriented to person, place, and time.     Motor: No tremor.     Coordination: Coordination normal.     Gait: Gait normal.  Psychiatric:        Attention and Perception: She is attentive. She does not perceive auditory hallucinations.        Mood and Affect: Mood is not anxious or depressed. Affect is not labile, blunt, angry, tearful or inappropriate.        Speech: Speech normal. Speech is not rapid and pressured, delayed or slurred.        Behavior: Behavior normal. Behavior is not agitated, aggressive, withdrawn or hyperactive.        Thought Content: Thought content normal. Thought content is not paranoid or delusional. Thought content does not include homicidal or suicidal ideation. Thought content does not include homicidal or suicidal plan.        Cognition and Memory: Cognition is impaired. Memory is impaired.        Judgment: Judgment normal.     Comments: Fair  insight and fair judgment. Less easily confused.  Less anxious   MMSE 27/30, recall 1/3, reduced attention on 03/26/18  Lab Review:     Component Value Date/Time   NA 141 12/26/2020 1609   K 4.1 12/26/2020 1609   CL 106 12/26/2020 1609   CO2 25 12/26/2020 1609   GLUCOSE 90 12/26/2020 1609   GLUCOSE 86 05/05/2006 0904   BUN 21 12/26/2020 1609   CREATININE 0.99 12/26/2020 1609   CREATININE 1.03 (H) 01/13/2020 1130   CALCIUM 9.0 12/26/2020 1609   PROT 6.8 12/26/2020 1609   ALBUMIN 4.1 12/26/2020 1609   AST 28 12/26/2020 1609   ALT 20 12/26/2020 1609    ALKPHOS 73 12/26/2020 1609   BILITOT 0.3 12/26/2020 1609   GFRNONAA >60 02/24/2018 0547   GFRAA >60 02/24/2018 0547       Component Value Date/Time   WBC 5.8 12/26/2020 1609   RBC 4.42 12/26/2020 1609   HGB 13.0 12/26/2020 1609   HCT 38.9 12/26/2020 1609   HCT 34.9 01/18/2018 0549   PLT 199.0 12/26/2020 1609   MCV 88.1 12/26/2020 1609   MCH 28.0 01/13/2020 1130   MCHC 33.5 12/26/2020 1609   RDW 13.8 12/26/2020 1609   LYMPHSABS 1.6 12/26/2020 1609   MONOABS 0.5 12/26/2020 1609   EOSABS 0.1 12/26/2020 1609   BASOSABS 0.1 12/26/2020 1609    No results found for: POCLITH, LITHIUM   Lab Results  Component Value Date   CBMZ 6.1 12/15/2018    Never got labs requested in November and reminded at each appt and never got them. .res Assessment: Plan:    Maximina was seen today for follow-up, depression, anxiety, stress, sleeping problem and medication problem.  Diagnoses and all orders for this visit:  Bipolar I disorder ([place])  Chronic insomnia  Generalized anxiety disorder  Mild cognitive impairment  Grief  Low vitamin D level  Delayed sleep phase syndrome    Greater than 50% of 30-minute face to face time with patient was spent on counseling and coordination of care.  There is ongoing concern about whether the patient is going to be able to take care of herself without assistance.  We discussed Extensive discussion about her bipolar disorder which is under relatively fair control at the moment and unusual given the degree of stress that she is experiencing.  She has had previous bouts of delirium with poor self-care and dehydration but she has done a better job of managing this.  She is not having adverse side effects with the medication. Seems to be better with compliance. Disc history of compliance problems due to her getting confused.    [name] died 05-08-2020  Reduced but didn't stop Equetro and mood stability is consistent and tolerated. No mania nor paranoia.   Appears to be tolerating meds.  Sleeping better with increased quetiapine to 300 HS. Reviewed bottles of meds with her. Bring all bottles to appts. Reviewed cost concerns about Equetro.  Alternaitve CBZ products with greater SE risk.  Switch generic CBZ ER 400 mg HS DT cost Equetro Quetiapine 300 tablets 1 at night  [name] 1 capsule at night as needed for trouble going to sleep   Cautioned around SE risk and fall risk.  Discussed importance of her self-care and compliance with medication for her overall mental stability.  Discussed potential metabolic side effects associated with atypical antipsychotics, as well as potential risk for movement side effects. Advised pt to contact office if movement side effects occur.   Disc grief risk and gets Hospice calls every 2  weeks. Manages well.  Extensive discussion around melatonin dosing and timing early evening and low dose. The way she is taking may make it worse. Melatonin 0.5 to 1 mg tablet about 7-8  PM in order to help you to go to sleep around 11:00 or so.  Emphasized adequate diet and fluid intake to prevent further episodes of delirium and orthostatic hypotension.   FU 4 mos  Lynder Parents, MD, DFAPA    Please see After Visit Summary for patient specific instructions.   Future Appointments  Date Time Provider [place]  05/06/2022  1:00 PM [facility]-NURSE HEALTH ADVISOR [facility]    No orders of the defined types were placed in this encounter.         -------------------------------

## 2021-07-22 ENCOUNTER — Other Ambulatory Visit: Payer: Self-pay | Admitting: Psychiatry

## 2021-07-22 ENCOUNTER — Telehealth: Payer: Self-pay | Admitting: Psychiatry

## 2021-07-22 DIAGNOSIS — F319 Bipolar disorder, unspecified: Secondary | ICD-10-CM

## 2021-07-22 NOTE — Telephone Encounter (Signed)
Next visit is 11/14/21. Belenda Cruise called requesting a refill on Equetro 400 mg called to:  [facility] [place] [facility] [address]  Phone:  [phone]  Fax:  [phone]

## 2021-07-22 NOTE — Telephone Encounter (Signed)
Rx sent 

## 2021-07-22 NOTE — Telephone Encounter (Signed)
Rebecca Novak called back to make sure we could order the [facility].  It is 200mg  2/day.  She is low on in and since it needs to come through the mail to order as soon as you can.

## 2021-07-26 ENCOUNTER — Telehealth: Payer: Self-pay | Admitting: Psychiatry

## 2021-07-26 NOTE — Telephone Encounter (Signed)
Pt called requesting we check with her before sending refill for Quetiapine to [facility]. She has 2 bottles now. Please note chart to check with Pt first.  ?

## 2021-08-27 ENCOUNTER — Telehealth: Payer: Self-pay | Admitting: Psychiatry

## 2021-08-27 NOTE — Telephone Encounter (Signed)
Zane called and is requesting a refill on her Carbamazepine [place]) called to: ? ? [place], [place] - [address] ? ?Phone:  [phone]  ?Fax:  [phone]  ? ?

## 2021-08-27 NOTE — Telephone Encounter (Signed)
Patient said Dr. Clovis Pu had sent a RF to [facility] and she was out of that, but she uses [facility] and has plenty of medication. She isn't sure why a RF was sent to [facility].  ?

## 2021-09-09 ENCOUNTER — Telehealth: Payer: Self-pay | Admitting: Family Medicine

## 2021-09-09 NOTE — Telephone Encounter (Signed)
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 Patient would like a callback to let her know  ? ? ? ? ? ?Please advise  ?

## 2021-09-09 NOTE — Telephone Encounter (Signed)
Spoke with the patient and scheduled a follow up appt on 5/5. ?

## 2021-09-24 DIAGNOSIS — M545 Low back pain, unspecified: Secondary | ICD-10-CM | POA: Diagnosis not present

## 2021-09-27 ENCOUNTER — Encounter: Payer: Self-pay | Admitting: Family Medicine

## 2021-09-27 ENCOUNTER — Ambulatory Visit (INDEPENDENT_AMBULATORY_CARE_PROVIDER_SITE_OTHER): Payer: Medicare Other | Admitting: Family Medicine

## 2021-09-27 VITALS — BP 108/52 | HR 65 | Temp 97.5°F | Ht 68.0 in | Wt 180.8 lb

## 2021-09-27 DIAGNOSIS — M79671 Pain in right foot: Secondary | ICD-10-CM

## 2021-09-27 DIAGNOSIS — E559 Vitamin D deficiency, unspecified: Secondary | ICD-10-CM

## 2021-09-27 DIAGNOSIS — E538 Deficiency of other specified B group vitamins: Secondary | ICD-10-CM

## 2021-09-27 DIAGNOSIS — E785 Hyperlipidemia, unspecified: Secondary | ICD-10-CM

## 2021-09-27 DIAGNOSIS — E039 Hypothyroidism, unspecified: Secondary | ICD-10-CM | POA: Diagnosis not present

## 2021-09-27 DIAGNOSIS — M546 Pain in thoracic spine: Secondary | ICD-10-CM

## 2021-09-27 DIAGNOSIS — F319 Bipolar disorder, unspecified: Secondary | ICD-10-CM

## 2021-09-27 LAB — LIPID PANEL
Cholesterol: 214 mg/dL — ABNORMAL HIGH (ref 0–200)
HDL: 61.9 mg/dL (ref >39.00)
NonHDL: 151.88
Total CHOL/HDL Ratio: 3
Triglycerides: 223 mg/dL — ABNORMAL HIGH (ref 0.0–149.0)
VLDL: 44.6 mg/dL — ABNORMAL HIGH (ref 0.0–40.0)

## 2021-09-27 LAB — COMPREHENSIVE METABOLIC PANEL
ALT: 20 U/L (ref 0–35)
AST: 22 U/L (ref 0–37)
Albumin: 3.7 g/dL (ref 3.5–5.2)
Alkaline Phosphatase: 79 U/L (ref 39–117)
BUN: 24 mg/dL — ABNORMAL HIGH (ref 6–23)
CO2: 28 mEq/L (ref 19–32)
Calcium: 8.7 mg/dL (ref 8.4–10.5)
Chloride: 104 mEq/L (ref 96–112)
Creatinine, Ser: 1.01 mg/dL (ref 0.40–1.20)
GFR: 53.56 mL/min — ABNORMAL LOW (ref >60.00)
Glucose, Bld: 92 mg/dL (ref 70–99)
Potassium: 4.1 mEq/L (ref 3.5–5.1)
Sodium: 141 mEq/L (ref 135–145)
Total Bilirubin: 0.2 mg/dL (ref 0.2–1.2)
Total Protein: 6.3 g/dL (ref 6.0–8.3)

## 2021-09-27 LAB — CBC WITH DIFFERENTIAL/PLATELET
Basophils Absolute: 0 10*3/uL (ref 0.0–0.1)
Basophils Relative: 0.5 % (ref 0.0–3.0)
Eosinophils Absolute: 0.1 10*3/uL (ref 0.0–0.7)
Eosinophils Relative: 1.7 % (ref 0.0–5.0)
HCT: 38.2 % (ref 36.0–46.0)
Hemoglobin: 12.7 g/dL (ref 12.0–15.0)
Lymphocytes Relative: 15.3 % (ref 12.0–46.0)
Lymphs Abs: 1.3 10*3/uL (ref 0.7–4.0)
MCHC: 33.2 g/dL (ref 30.0–36.0)
MCV: 89.5 fl (ref 78.0–100.0)
Monocytes Absolute: 0.6 10*3/uL (ref 0.1–1.0)
Monocytes Relative: 6.6 % (ref 3.0–12.0)
Neutro Abs: 6.5 10*3/uL (ref 1.4–7.7)
Neutrophils Relative %: 75.9 % (ref 43.0–77.0)
Platelets: 198 10*3/uL (ref 150.0–400.0)
RBC: 4.27 Mil/uL (ref 3.87–5.11)
RDW: 13.7 % (ref 11.5–15.5)
WBC: 8.5 10*3/uL (ref 4.0–10.5)

## 2021-09-27 LAB — VITAMIN B12: Vitamin B-12: 378 pg/mL (ref 211–911)

## 2021-09-27 LAB — LDL CHOLESTEROL, DIRECT: Direct LDL: 125 mg/dL

## 2021-09-27 LAB — TSH: TSH: 7.43 u[IU]/mL — ABNORMAL HIGH (ref 0.35–5.50)

## 2021-09-27 LAB — VITAMIN D 25 HYDROXY (VIT D DEFICIENCY, FRACTURES): VITD: 26.11 ng/mL — ABNORMAL LOW (ref 30.00–100.00)

## 2021-09-27 LAB — FOLATE: Folate: >23.5 ng/mL (ref >5.9)

## 2021-09-27 MED ORDER — MELOXICAM 7.5 MG PO TABS: ORAL_TABLET | ORAL | 0 refills | Status: DC

## 2021-09-27 NOTE — Patient Instructions (Addendum)
*  ok to stop the prednisone and just start the meloxicam. Take the meloxicam with food daily x 2 weeks. Let me know if pain is not better at that point. ? ?*you can use heat as needed/if helping with muscle spasm.  ? ?*if your muscles are hurting - I like the [employer]acare heat wraps - these last all day long and provide continuous heat. If you just have pain - using something like "salonpas" patches - these are lidocaine/numbing patches.  ? ?*continue to move, stretch regularly.  ?

## 2021-09-27 NOTE — Progress Notes (Signed)
?Rebecca Novak ?DOB: May 19, 1946 ?Encounter date: 09/27/2021 ? ?This is a 76 y.o. female who presents with ?Chief Complaint  ?Patient presents with  ? Follow-up  ? Fall  ?  Patient states she tripped and fell onto her back 5 days ago at the [facility], seen by a provider 3 days ago and complains of recurrent pain  ? ? ?History of present illness: ?Last sat tripped and fell back and hit back on chair. Does 30 minutes treadmill and 30 minutes bike. Just finished bike and felt like right leg didn't step up. She fell directly back onto chair on bike. In the past she has taken meloxicam and did well for her. She did get heating pad at [employer] and started this. She will use in the den when reading/watching tv.  ? ?Last visit with psychiatry was 07/17/21 (Dr. Clovis Pu). Last visit with me was 12/26/20. We discussed los off significant other at that time (he passed 03/2020) and her adjustment to this.  ? ?Bipolar disorder/insomnea: follows with psychiatry. Her mood overall is doing well. She feels good. Has been committed to daily exercise. Appetite is normal. Hasn't changed what she is eating. She is surprised by weight gain. ? ?Hypothyroid: synthroid 68mg ? ? ?No Known Allergies ?Current Meds  ?Medication Sig  ? carbamazepine (CARBATROL) 200 MG 12 hr capsule Take 2 capsules (400 mg total) by mouth at bedtime.  ? carbamazepine (EQUETRO) 200 MG CP12 12 hr capsule Take 2 capsules (400 mg total) by mouth daily.  ? levothyroxine (SYNTHROID) 25 MCG tablet Take 1 tablet (25 mcg total) by mouth daily.  ? meloxicam (MOBIC) 7.5 MG tablet 7.'5mg'$  daily prn  ? QUEtiapine (SEROQUEL) 300 MG tablet Take 1 tablet (300 mg total) by mouth at bedtime.  ? SF 5000 PLUS 1.1 % CREA dental cream USE ONCE AT BEDTIME IN PLACE OF REGULAR TOOTHPASTE. BRUSH FOR 2 MINUTES EXPECTORATE. DO NOT RINSE  ? simvastatin (ZOCOR) 40 MG tablet TAKE 1 TABLET BY MOUTH AT BEDTIME . APPOINTMENT REQUIRED FOR FUTURE REFILLS  ? zaleplon (SONATA) 10 MG capsule Take 1 capsule  (10 mg total) by mouth at bedtime as needed for sleep.  ? ? ?Review of Systems  ?Constitutional:  Negative for chills, fatigue and fever.  ?Respiratory:  Negative for cough, chest tightness, shortness of breath and wheezing.   ?Cardiovascular:  Positive for chest pain. Negative for palpitations and leg swelling.  ?Musculoskeletal:  Positive for back pain.  ? ?Objective: ? ?BP (!) 108/52 (BP Location: Left Arm, Patient Position: Sitting, Cuff Size: Large)   Pulse 65   Temp (!) 97.5 ?F (36.4 ?C) (Oral)   Ht '5\' 8"'$  (1.727 m)   Wt 180 lb 12.8 oz (82 kg)   SpO2 98%   BMI 27.49 kg/m?   Weight: 180 lb 12.8 oz (82 kg)  ? ?BP Readings from Last 3 Encounters:  ?09/27/21 (!) 108/52  ?12/26/20 (!) 100/50  ?02/01/20 106/60  ? ?Wt Readings from Last 3 Encounters:  ?09/27/21 180 lb 12.8 oz (82 kg)  ?05/03/21 166 lb (75.3 kg)  ?12/26/20 166 lb 9.6 oz (75.6 kg)  ? ? ?Physical Exam ?Constitutional:   ?   General: She is not in acute distress. ?   Appearance: She is well-developed.  ?Cardiovascular:  ?   Rate and Rhythm: Normal rate and regular rhythm.  ?   Heart sounds: Normal heart sounds. No murmur heard. ?  No friction rub.  ?Pulmonary:  ?   Effort: Pulmonary effort is normal.  No respiratory distress.  ?   Breath sounds: Normal breath sounds. No wheezing or rales.  ?Chest:  ?   Comments: Echymosis over right lower posterior ribs. There is tenderness in this area as well as lower thoracic para lumbar musculature R>L. Range of motion is slightly restricted with increased pain with any movement that stretches her right side. ?Musculoskeletal:  ?   Right lower leg: No edema.  ?   Left lower leg: No edema.  ?Feet:  ?   Comments: No longer having pain with palpation to ball of right foot. ?Neurological:  ?   Mental Status: She is alert and oriented to person, place, and time.  ?Psychiatric:     ?   Behavior: Behavior normal.  ? ? ?Assessment/Plan ? ?1. Acquired hypothyroidism ?Recheck labs; currently on 54mg synthroid. ?- TSH;  Future ?- TSH ?- TSH ? ?2. Dyslipidemia ?Zocor '40mg'$  daily ?- Lipid panel; Future ?- Comprehensive metabolic panel; Future ?- Comprehensive metabolic panel ?- Lipid panel ? ?3. Bipolar affective disorder, remission status unspecified ([place] ?Mood has been stable.  Follows regularly with psychiatry. ? ?4. Foot pain, right ?This has essentially resolved.  She has been wearing extra support/cushion for her right foot.  I feel that her foot discomfort is related to loss of fat pad under the ball of the foot.  She seems to do well with supportive shoes and increased cushion in socks.  Continue this as needed. ? ?5. B12 deficiency ?Not currently supplementing.  Has been deficient and low in the past. ?- CBC with Differential/Platelet; Future ?- Vitamin B12; Future ?- Folate; Future ?- Folate ?- Vitamin B12 ?- CBC with Differential/Platelet ? ?6. Vitamin D deficiency ?She is currently taking supplement.  Uncertain amount. ?- VITAMIN D 25 Hydroxy (Vit-D Deficiency, Fractures); Future ?- VITAMIN D 25 Hydroxy (Vit-D Deficiency, Fractures) ? ?7.  Back pain ?[facility]  X-rays were done and per note state no fracture of ribs or spine.  She does have some ecchymosis over the right posterior ribs and some tenderness with palpation surrounding this area.  We are going to try meloxicam as this has worked well for her in the past (short-term) as well as ice and heat and gentle range of motion to see if this helps with pain.  She will let me know if this is not working. ? ?Return for pending lab or imaging results. ? ? ? ? ?JMicheline Rough MD ?

## 2021-09-30 ENCOUNTER — Telehealth: Payer: Self-pay | Admitting: Family Medicine

## 2021-09-30 DIAGNOSIS — E039 Hypothyroidism, unspecified: Secondary | ICD-10-CM

## 2021-09-30 NOTE — Telephone Encounter (Signed)
I spoke with the pt and she reported that she has been taking this medication since her last visit on 09/27/2021 pt stated that she is confused and would like to know how to proceed with medication.  ?

## 2021-09-30 NOTE — Telephone Encounter (Signed)
Pt call and stated she want to know if dr. Ethlyn Gallery want her to stay on the levothyroxine (SYNTHROID) 25 MCG tablet  ?

## 2021-10-01 NOTE — Telephone Encounter (Signed)
Pt is calling and she is aware dr Ethlyn Gallery is  not in office and will be back today  10-02-2021 ?

## 2021-10-02 ENCOUNTER — Other Ambulatory Visit: Payer: Self-pay | Admitting: Family Medicine

## 2021-10-02 MED ORDER — LEVOTHYROXINE SODIUM 50 MCG PO TABS: 50.0000 ug | ORAL_TABLET | Freq: Every day | ORAL | 1 refills | Status: DC

## 2021-10-02 NOTE — Telephone Encounter (Signed)
Sorry for confusion - a couple of visits ago she had not been taking regularly so I wanted to make sure before we changed up anything. I am going to send in a new dose (synthroid 68mg daily) for her. This should help bring her a little better into normal range. Plan to recheck TSH in 3 mo. ?

## 2021-10-02 NOTE — Telephone Encounter (Signed)
Patient informed of the results and a lab appt was scheduled for 8/10. ?

## 2021-10-02 NOTE — Addendum Note (Signed)
Addended by: Agnes Lawrence on: 10/02/2021 03:23 PM ? ? Modules accepted: Orders ? ?

## 2021-10-09 MED ORDER — ROSUVASTATIN CALCIUM 10 MG PO TABS: 10.0000 mg | ORAL_TABLET | Freq: Every day | ORAL | 1 refills | Status: DC

## 2021-10-09 NOTE — Addendum Note (Signed)
Addended by: Agnes Lawrence on: 10/09/2021 04:32 PM ? ? Modules accepted: Orders ? ?

## 2021-10-17 ENCOUNTER — Telehealth: Payer: Self-pay | Admitting: Family Medicine

## 2021-10-17 NOTE — Telephone Encounter (Signed)
Spoke with the patient and informed her per the lab test results from 5/5, PCP advised to stop taking Simvastatin and begin Rosuvastatin.  Simvastatin was also removed from the medication list.

## 2021-10-17 NOTE — Telephone Encounter (Signed)
Pt is questioning whether she should be taking  rosuvastatin (CRESTOR) 10 MG tablet and simvastatin (ZOCOR) 40 MG tablet  at the same time

## 2021-10-23 DIAGNOSIS — D123 Benign neoplasm of transverse colon: Secondary | ICD-10-CM | POA: Diagnosis not present

## 2021-10-23 DIAGNOSIS — Z09 Encounter for follow-up examination after completed treatment for conditions other than malignant neoplasm: Secondary | ICD-10-CM | POA: Diagnosis not present

## 2021-10-23 DIAGNOSIS — Z86010 Personal history of colonic polyps: Secondary | ICD-10-CM | POA: Diagnosis not present

## 2021-10-23 DIAGNOSIS — K5730 Diverticulosis of large intestine without perforation or abscess without bleeding: Secondary | ICD-10-CM | POA: Diagnosis not present

## 2021-10-23 LAB — HM COLONOSCOPY

## 2021-10-30 DIAGNOSIS — D123 Benign neoplasm of transverse colon: Secondary | ICD-10-CM | POA: Diagnosis not present

## 2021-11-14 ENCOUNTER — Ambulatory Visit (INDEPENDENT_AMBULATORY_CARE_PROVIDER_SITE_OTHER): Payer: Medicare Other | Admitting: Psychiatry

## 2021-11-14 ENCOUNTER — Encounter: Payer: Self-pay | Admitting: Psychiatry

## 2021-11-14 DIAGNOSIS — F319 Bipolar disorder, unspecified: Secondary | ICD-10-CM

## 2021-11-14 DIAGNOSIS — F5104 Psychophysiologic insomnia: Secondary | ICD-10-CM | POA: Diagnosis not present

## 2021-11-14 DIAGNOSIS — G3184 Mild cognitive impairment, so stated: Secondary | ICD-10-CM | POA: Diagnosis not present

## 2021-11-14 DIAGNOSIS — F4321 Adjustment disorder with depressed mood: Secondary | ICD-10-CM | POA: Diagnosis not present

## 2021-11-14 DIAGNOSIS — R7989 Other specified abnormal findings of blood chemistry: Secondary | ICD-10-CM

## 2021-11-14 DIAGNOSIS — F411 Generalized anxiety disorder: Secondary | ICD-10-CM

## 2021-11-14 MED ORDER — CARBAMAZEPINE ER 200 MG PO CP12: 400.0000 mg | ORAL_CAPSULE | Freq: Every day | ORAL | 1 refills | Status: DC

## 2021-11-14 MED ORDER — QUETIAPINE FUMARATE 300 MG PO TABS: 300.0000 mg | ORAL_TABLET | Freq: Every day | ORAL | 1 refills | Status: DC

## 2021-11-14 NOTE — Progress Notes (Signed)
Rebecca Novak [id number] 07-06-1945 76 y.o.  Subjective:   Patient ID:  Rebecca Novak is a 76 y.o. (DOB [birth year]) female.  Chief Complaint:  Chief Complaint  Patient presents with   Follow-up   Depression   Anxiety   Memory Loss   Sleeping Problem   Altered Mental Status    Depression   Anxiety   Memory Loss   Medication Problem   Stress   Sleeping Problem      Medication Refill Associated symptoms include arthralgias. Pertinent negatives include no fatigue.  Depression        Associated symptoms include decreased concentration.  Associated symptoms include no fatigue, no appetite change and no suicidal ideas.  Past medical history includes anxiety.   Anxiety Symptoms include decreased concentration. Patient reports no confusion, dizziness, nervous/anxious behavior or suicidal ideas.     Rebecca Novak presents to the office today for follow-up of above and bipolar disorder.    Patient called July 19, 2019 reporting that she had inadvertently stopped carbamazepine apparently as much as 6 months ago.  Given that the patient was not notably worse off that medication it was not restarted.  There also had been some question since her last appointment about her consistency with the clonazepam as well.  12/22/2019 appointment the following is noted: Since being here patient called back and has apparently been confused about whether she was taking Equetro or not as she was asking for refill and indicating that she had never stopped it.  So prescription refills were sent. "I misspoke"  Claims has been taking Equetro 400 mg HS. Continued good mood and no complaints.  Really well.  Anxiety is stable.  Weight and appetite better but diet is not good bc no veges or fruits. Plan: no med changes  05/03/20 appt with following noted: Rebecca Novak passed the day after Thanksgiving.  It's a big shock.  Has been staying with [facility] since then.  Will try to stay at her house  tonight if she can.   Rebecca Novak has a will.  Crystal has made [name] her POA and agrees for ROI for [name].  Pt feels she can manage the estate with direction. [name] has been supportive. Obviously sad.  Had been doing OK caring for him until he passed peacefully.  She liked caring for him.   Rebecca Novak was cremated.   Otherwise doing OK with meds and not med complaints.  Compliant. Plan: Continue Equetro 400 mg HS Latuda 120 mg  Quetiapine 100 mg 2 tablets at night  05/08/2020 phone call patient complained of insomnia.  Quetiapine was increased to 300 mg nightly  06/13/2020 numerous phone calls.  Per the patient and the sister there was inconsistent information about whether the patient had been compliant or not with carbamazepine and was missing some Latuda as well at times.  Patient seemed confused about her meds.  The nurse spoke with her to try to educate her to the extent possible.  The patient was encouraged to comply with the medications as prescribed.  To get help from her sister if needed.  08/29/2020 appointment with the following noted: Admits missing Latuda bc she didn't see it.  Probably for months. Bottle dated 09/2019 Compliant with Equetro 200 mg 2 at night and Quetiapine increased to 300 mg tablet, 1 at night. Seeking grief counseling.  Really missing Rebecca Novak. Thinks she's done overall OK otherwise.  She's gone back to [name].  Not socializing much.  10/10/2020 appointment with  the following noted: Not sure how this appt got scheduled.  Denies calling and saying she had SI. "Busy and Happy".   Conflict with Rebecca Novak's family trying to insist she pay $100K for the house.  Provided her with letter as requested.    Stressed over it.  Dealing with estate issues.  His son is the executor of the estate.  Rebecca Novak left the house to her but their are loans against it. Plan: Continue Equetro 400 mg HS Latuda Hold bc she's apparently been off for months Quetiapine 300 tablets 1 at night  No med  changes indicated.   03/13/2021 appointment with the following noted: Doing real well.  Misses Rebecca Novak but keeps herself busy.  [name] helps and lost weight 162# from 199#.  Treadmill and bike each for 30 mins.  Still living in the same house.   Had to buy the house.  Plans to stay there for a few years.  Feels healthy and had PE.  Only change added Levothyroxine.  Reads at night.  Watches local and national news. Last 2 nights still awake at 4 am.  Melatonin not helping. Not worried about anything. Trouble going to sleep for a month and half. No afternoon caffeine. Go to bed 1130. Out of bed 9 AM Patient reports stable mood and denies depressed or irritable moods.  Patient denies any recent difficulty with anxiety.  . Denies appetite disturbance.  Patient reports that energy and motivation have been good.  Patient denies any difficulty with concentration.  Patient denies any suicidal ideation.  06/27/2021 phone call requesting to substitute lithium for Equetro because of the cost of Equetro.  Chart review says she took lithium in the past with poor response.  We will explore alternatives at the next appointment.  07/17/2021 appointment with the following noted: Equetro 400 mg nightly, quetiapine 300 mg nightly, Sonata as needed insomnia Doing real well overall.  Stays busy and church every Sunday.  Involved with Women's group for widows.   Enjoys [facility]) women's group. [facility] a [facility].  Made friends.  Someone calls and checks on her daily. [name] lives in [place] 76 yo.  She decided pt needs to be closer to her.  [name] may move further away. Needs something for sleep nightly and concerns [name] is $17 Patient reports stable mood and denies depressed or irritable moods.  Patient denies any recent difficulty with anxiety.  Denies appetite disturbance.  Patient reports that energy and motivation have been good.  Patient denies any difficulty with concentration.   Patient denies any suicidal ideation.  11/14/2021 appointment with the following noted: Doing really well still.  [facility] [employer] to [facility] 7 days a week.  Made friends.  Someone calls and checks on her daily.  Loves it. Never want to remarry.  Not interested.   Weight is good.   No SE Loves the church.  [facility] Equetro 400 mg nightly, quetiapine 300 mg nightly,   can afford Equetro Does group montly for grief. Sleep is ok.  Reads books from [employer].  Past Psychiatric Medication Trials: Equetro 1200,  Latuda 120,  lithium no response,  Abilify 20, olanzapine, Geodon 160, gabapentin, lamotrigine 100, Depakote, topiramate Lexapro, sertraline, fluoxetine,duloxetine, Wellbutrin,  trazodone, Ambien, clonazepam, temazepam,  buspirone, Deplin,  Multiple problems. 2  hospitalizations (last 02/23/18) DT confusion and with high CBZ levels, the last was 13.9, but she was also dehydrated.   Dosage reduced now  Review  of Systems:  Review of Systems  Constitutional:  Negative for activity change, appetite change, fatigue and unexpected weight change.  Gastrointestinal: Negative.   Musculoskeletal:  Positive for arthralgias.  Neurological:  Negative for dizziness and tremors.  Psychiatric/Behavioral:  Positive for decreased concentration. Negative for agitation, behavioral problems, confusion, dysphoric mood, hallucinations, self-injury, sleep disturbance and suicidal ideas. The patient is not nervous/anxious and is not hyperactive.   Not dizzy.   Medications: I have reviewed the patient's current medications.  I  Current Outpatient Medications  Medication Sig Dispense Refill   levothyroxine (SYNTHROID) 50 MCG tablet Take 1 tablet (50 mcg total) by mouth daily. 90 tablet 1   meloxicam (MOBIC) 7.5 MG tablet 7.'5mg'$  daily prn 30 tablet 0   rosuvastatin (CRESTOR) 10 MG tablet Take 1 tablet (10 mg total) by mouth daily. 90 tablet 1   SF 5000 PLUS 1.1 % CREA dental cream USE ONCE AT BEDTIME IN PLACE  OF REGULAR TOOTHPASTE. BRUSH FOR 2 MINUTES EXPECTORATE. DO NOT RINSE     zaleplon (SONATA) 10 MG capsule Take 1 capsule (10 mg total) by mouth at bedtime as needed for sleep. 30 capsule 2   carbamazepine (EQUETRO) 200 MG CP12 12 hr capsule Take 2 capsules (400 mg total) by mouth daily. 180 capsule 1   QUEtiapine (SEROQUEL) 300 MG tablet Take 1 tablet (300 mg total) by mouth at bedtime. 90 tablet 1   No current facility-administered medications for this visit.    Medication Side Effects: None  Allergies: No Known Allergies  Past Medical History:  Diagnosis Date   ANEMIA, MILD 05/17/2008   BCC (basal cell carcinoma) 08/08/2009   right cheek (CX35FU +exc. 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   upper back (CX35FU)   SCC (squamous cell carcinoma) 11/18/2007   right lower leg (CX35FU)   SCC (squamous cell carcinoma) 03/12/2011   left outer calf   SCC (squamous cell carcinoma) 11/19/2011   left side of chest -tx p bx   SCC (squamous cell carcinoma) 12/08/2012   left chest (CX35FU)   SCC (squamous cell carcinoma) 09/28/2014   left arm inferior (CX35FU), right arm, lateral (CX35FU)   SCC (squamous cell carcinoma) 04/25/2015   rigth forearm    SCC (squamous cell carcinoma) 01/10/2017   right shin proximal (CX35FU)   SCC (squamous cell carcinoma) 04/21/2018   top right hand-tx p bx   SYSTOLIC MURMUR 83/38/2505    Family History  Problem Relation Age of Onset   Dementia  Mother    Healthy Father        until death at age 49   Mental illness Brother        not been in contact in years   Healthy Sister     Social History   Socioeconomic History   Marital status: Married    Spouse name: [name]   Number of children: 0   Years of education: Not on file  Highest education level: Not on file  Occupational History   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  Tobacco Use   Smoking status: Former    Packs/day: 0.33    Years: 44.00    Total pack years: 14.52    Types: Cigarettes   Smokeless tobacco: Never   Tobacco comments:    02/15/2013 "stopped smoking cigarettes ~ 7 yr ago"  Vaping Use   Vaping Use: Never used  Substance and Sexual Activity   Alcohol use: No   Drug use: No   Sexual activity: Yes  Other Topics Concern   Not on file  Social History Narrative   Not on file   Social Determinants of Health   Financial Resource Strain: Low Risk  (05/03/2021)   Overall Financial Resource Strain (CARDIA)    Difficulty of Paying Living Expenses: Not hard at all  Food Insecurity: No Food Insecurity (05/03/2021)   Hunger Vital Sign    Worried About Running Out of Food in the Last Year: Never true    Ran Out of Food in the Last Year: Never true  Transportation Needs: No Transportation Needs (05/03/2021)   PRAPARE - Hydrologist (Medical): No    Lack of Transportation (Non-Medical): No  Physical Activity: Sufficiently Active (05/03/2021)   Exercise Vital Sign    Days of Exercise per Week: 7 days    Minutes of Exercise per Session: 60 min  Stress: No Stress Concern Present (05/03/2021)   [place]    Feeling of Stress : Not at all  Social Connections: Moderately Integrated (05/03/2021)   Social Connection and Isolation Panel [NHANES]    Frequency of Communication with Friends and Family: More than three times a week    Frequency of  Social Gatherings with Friends and Family: More than three times a week    Attends Religious Services: More than 4 times per year    Active Member of [employer] or Organizations: Yes    Attends Archivist Meetings: More than 4 times per year    Marital Status: Widowed  Intimate Partner Violence: Not At Risk (05/03/2021)   Humiliation, Afraid, Rape, and Kick questionnaire    Fear of Current or Ex-Partner: No    Emotionally Abused: No    Physically Abused: No    Sexually Abused: No    Past Medical History, Surgical history, Social history, and Family history were reviewed and updated as appropriate.   Rebecca Novak was in rehab awhile.  Back home.  Has terminal CA.  He's now dependent on her and she's always been dependent on him in the past.  She doesn't prepare meals.  Please see review of systems for further details on the patient's review from today.   Objective:   Physical Exam:  There were no vitals taken for this visit.  Physical Exam Constitutional:      General: She is not in acute distress.    Appearance: She is well-developed.  Musculoskeletal:        General: No deformity.  Neurological:     Mental Status: She is alert and oriented to person, place, and time.     Motor: No tremor.     Coordination: Coordination normal.     Gait: Gait normal.  Psychiatric:        Attention and Perception: She is attentive. She does not perceive auditory hallucinations.        Mood  and Affect: Mood is not anxious or depressed. Affect is not labile, blunt, angry, tearful or inappropriate.        Speech: Speech normal. Speech is not rapid and pressured, delayed or slurred.        Behavior: Behavior normal. Behavior is not agitated, aggressive, withdrawn or hyperactive.        Thought Content: Thought content normal. Thought content is not paranoid or delusional. Thought content does not include homicidal or suicidal ideation. Thought content does not include suicidal plan.        Cognition  and Memory: Cognition is impaired. Memory is impaired.        Judgment: Judgment normal.     Comments: Fair  insight and fair judgment. Less easily confused.  Less anxious, upbeat    MMSE 27/30, recall 1/3, reduced attention on 03/26/18  Lab Review:     Component Value Date/Time   NA 141 09/27/2021 1246   K 4.1 09/27/2021 1246   CL 104 09/27/2021 1246   CO2 28 09/27/2021 1246   GLUCOSE 92 09/27/2021 1246   GLUCOSE 86 05/05/2006 0904   BUN 24 (H) 09/27/2021 1246   CREATININE 1.01 09/27/2021 1246   CREATININE 1.03 (H) 01/13/2020 1130   CALCIUM 8.7 09/27/2021 1246   PROT 6.3 09/27/2021 1246   ALBUMIN 3.7 09/27/2021 1246   AST 22 09/27/2021 1246   ALT 20 09/27/2021 1246   ALKPHOS 79 09/27/2021 1246   BILITOT 0.2 09/27/2021 1246   GFRNONAA >60 02/24/2018 0547   GFRAA >60 02/24/2018 0547       Component Value Date/Time   WBC 8.5 09/27/2021 1246   RBC 4.27 09/27/2021 1246   HGB 12.7 09/27/2021 1246   HCT 38.2 09/27/2021 1246   HCT 34.9 01/18/2018 0549   PLT 198.0 09/27/2021 1246   MCV 89.5 09/27/2021 1246   MCH 28.0 01/13/2020 1130   MCHC 33.2 09/27/2021 1246   RDW 13.7 09/27/2021 1246   LYMPHSABS 1.3 09/27/2021 1246   MONOABS 0.6 09/27/2021 1246   EOSABS 0.1 09/27/2021 1246   BASOSABS 0.0 09/27/2021 1246    No results found for: "POCLITH", "LITHIUM"   Lab Results  Component Value Date   CBMZ 6.1 12/15/2018    Never got labs requested in November and reminded at each appt and never got them. .res Assessment: Plan:    Jamilex was seen today for follow-up, depression, anxiety, memory loss and sleeping problem.  Diagnoses and all orders for this visit:  Bipolar I disorder ([place]) -     carbamazepine (EQUETRO) 200 MG CP12 12 hr capsule; Take 2 capsules (400 mg total) by mouth daily. -     QUEtiapine (SEROQUEL) 300 MG tablet; Take 1 tablet (300 mg total) by mouth at bedtime.  Generalized anxiety disorder  Mild cognitive impairment  Chronic insomnia -      QUEtiapine (SEROQUEL) 300 MG tablet; Take 1 tablet (300 mg total) by mouth at bedtime.  Grief  Low vitamin D level    Greater than 50% of 30-minute face to face time with patient was spent on counseling and coordination of care.  There is ongoing concern about whether the patient is going to be able to take care of herself without assistance.  We discussed Extensive discussion about her bipolar disorder which is under relatively fair control at the moment and unusual given the degree of stress that she is experiencing.  She has had previous bouts of delirium with poor self-care and dehydration but she has done  a better job of managing this.  She is not having adverse side effects with the medication. Seems to be better with compliance. Disc history of compliance problems due to her getting confused.   Doing well and better than expected.  Rebecca Novak died 05/09/2020  Reduced but didn't stop Equetro and mood stability is consistent and tolerated. No mania nor paranoia.  Appears to be tolerating meds.  Sleeping better with increased quetiapine to 300 HS. Reviewed bottles of meds with her. Bring all bottles to appts. Reviewed cost concerns about Equetro.  Alternaitve CBZ products with greater SE risk.  Continue Equetro 200 mg capsules, 2 nightly Quetiapine 300 tablets 1 at night  [name] 1 capsule at night as needed for trouble going to sleep.  Not currentl   Cautioned around SE risk and fall risk.  Discussed importance of her self-care and compliance with medication for her overall mental stability.  Discussed potential metabolic side effects associated with atypical antipsychotics, as well as potential risk for movement side effects. Advised pt to contact office if movement side effects occur.   Disc grief risk and gets Hospice calls every 2 weeks. Manages well.  Extensive discussion around melatonin dosing and timing early evening and low dose. The way she is taking may make it  worse. Melatonin 0.5 to 1 mg tablet about 7-8  PM in order to help you to go to sleep around 11:00 or so.  Emphasized adequate diet and fluid intake to prevent further episodes of delirium and orthostatic hypotension.   FU 5 mos  Lynder Parents, MD, DFAPA    Please see After Visit Summary for patient specific instructions.   Future Appointments  Date Time Provider [place]  01/02/2022  1:40 PM [facility]  05/06/2022  1:00 PM [facility]-NURSE HEALTH ADVISOR [facility]    No orders of the defined types were placed in this encounter.         -------------------------------

## 2021-12-03 ENCOUNTER — Encounter: Payer: Self-pay | Admitting: Family

## 2021-12-17 ENCOUNTER — Encounter: Payer: Self-pay | Admitting: Family Medicine

## 2021-12-17 ENCOUNTER — Ambulatory Visit (INDEPENDENT_AMBULATORY_CARE_PROVIDER_SITE_OTHER): Payer: Medicare Other | Admitting: Family Medicine

## 2021-12-17 VITALS — BP 102/60 | HR 60 | Temp 98.5°F | Wt 183.0 lb

## 2021-12-17 DIAGNOSIS — L309 Dermatitis, unspecified: Secondary | ICD-10-CM

## 2021-12-17 MED ORDER — TRIAMCINOLONE ACETONIDE 0.1 % EX CREA: 1.0000 | TOPICAL_CREAM | Freq: Two times a day (BID) | CUTANEOUS | 1 refills | Status: DC

## 2021-12-17 NOTE — Progress Notes (Signed)
   Subjective:    Patient ID: Rebecca Novak, female    DOB: 1946/02/29, 76 y.o.   MRN: [id number]  HPI Here for 3 days of an itchy rash all over her body. She has never had anything like this before. She is taking an antihistamine daily that her pharmacist suggested. She canot think of anything different in her environment, no new soaps, detergents, etc.    Review of Systems  Constitutional: Negative.   Respiratory: Negative.    Cardiovascular: Negative.   Skin:  Positive for rash.       Objective:   Physical Exam Constitutional:      General: She is not in acute distress.    Appearance: Normal appearance.  Cardiovascular:     Rate and Rhythm: Normal rate and regular rhythm.     Pulses: Normal pulses.     Heart sounds: Normal heart sounds.  Pulmonary:     Effort: Pulmonary effort is normal.     Breath sounds: Normal breath sounds.  Skin:    Comments: There are widespread patches of slightly raised scaly red skin on the arms, legs, and trunk. Nothing on the face or scalp  Neurological:     Mental Status: She is alert.           Assessment & Plan:  Eczema. We will treat with Triamcinolone cream BID until this clears.  Alysia Penna, MD

## 2021-12-23 ENCOUNTER — Other Ambulatory Visit: Payer: Self-pay | Admitting: Family Medicine

## 2021-12-23 ENCOUNTER — Telehealth: Payer: Self-pay | Admitting: Family Medicine

## 2021-12-23 DIAGNOSIS — L309 Dermatitis, unspecified: Secondary | ICD-10-CM

## 2021-12-23 MED ORDER — CLOBETASOL PROPIONATE 0.05 % EX CREA: 1.0000 | TOPICAL_CREAM | Freq: Two times a day (BID) | CUTANEOUS | 0 refills | Status: DC

## 2021-12-23 NOTE — Telephone Encounter (Signed)
Pt called to say the medication prescribed to her on 12/17/21 by Dr. Sarajane Jews for her itchy rash is not working, and the rash is in fact getting worse. Can MD think of anything else that is stronger that he can prescribe?  Please advise. [facility] [address] [place] - [address] Phone:  [phone]  Fax:  [phone]

## 2021-12-23 NOTE — Telephone Encounter (Signed)
Patient informed of the message below.

## 2021-12-23 NOTE — Telephone Encounter (Signed)
We can try a stronger cream but if she is not getting better in the next week she should schedule an appointment with me so I can look at it. I will call in clobetasol ointment - this is the strongest cream I can give her topically.

## 2021-12-24 ENCOUNTER — Telehealth: Payer: Self-pay | Admitting: Family Medicine

## 2021-12-24 MED ORDER — BETAMETHASONE DIPROPIONATE 0.05 % EX OINT: TOPICAL_OINTMENT | Freq: Two times a day (BID) | CUTANEOUS | 0 refills | Status: DC

## 2021-12-24 NOTE — Telephone Encounter (Signed)
Lvm for patient, new prescription sent to pharmacy.

## 2021-12-24 NOTE — Telephone Encounter (Signed)
I sent in Betamethasone for her to try

## 2021-12-24 NOTE — Telephone Encounter (Signed)
Pt is calling and  seen dr fry on 12-17-2021 for eczema on her arm and legs  eczema medication is not working and pt has  an appt in aug to see dermatologist. Pt would like something else sent to  [place], [place] - [address] Phone:  [phone]  Fax:  [phone]

## 2022-01-02 ENCOUNTER — Other Ambulatory Visit: Payer: Medicare Other

## 2022-01-02 DIAGNOSIS — E039 Hypothyroidism, unspecified: Secondary | ICD-10-CM

## 2022-01-02 LAB — TSH: TSH: 5.1 u[IU]/mL (ref 0.35–5.50)

## 2022-01-03 ENCOUNTER — Encounter: Payer: Self-pay | Admitting: Family Medicine

## 2022-01-03 ENCOUNTER — Ambulatory Visit (INDEPENDENT_AMBULATORY_CARE_PROVIDER_SITE_OTHER): Payer: Medicare Other | Admitting: Family Medicine

## 2022-01-03 VITALS — BP 118/58 | HR 64 | Temp 97.6°F | Ht 68.0 in | Wt 184.7 lb

## 2022-01-03 DIAGNOSIS — I951 Orthostatic hypotension: Secondary | ICD-10-CM

## 2022-01-03 DIAGNOSIS — F319 Bipolar disorder, unspecified: Secondary | ICD-10-CM

## 2022-01-03 DIAGNOSIS — E039 Hypothyroidism, unspecified: Secondary | ICD-10-CM

## 2022-01-03 DIAGNOSIS — E785 Hyperlipidemia, unspecified: Secondary | ICD-10-CM

## 2022-01-03 DIAGNOSIS — E559 Vitamin D deficiency, unspecified: Secondary | ICD-10-CM | POA: Insufficient documentation

## 2022-01-03 DIAGNOSIS — R21 Rash and other nonspecific skin eruption: Secondary | ICD-10-CM

## 2022-01-03 DIAGNOSIS — Z780 Asymptomatic menopausal state: Secondary | ICD-10-CM | POA: Diagnosis not present

## 2022-01-03 NOTE — Progress Notes (Unsigned)
Established Patient Office Visit  Subjective   Patient ID: Rebecca Novak, female    DOB: [birth year]  Age: 76 y.o. MRN: [id number]  Chief Complaint  Patient presents with   Establish Care    Patient is here for transition of care visit. Patient reports she had developed a rash recently, about 1 1/2 weeks ago. She was seen and was given an ointment but it is not working. Has a follow up with her dermatoolgist soon, not itchy, only present on the forearms, no open wounds or other abnormalities, no bruising. She reports no changes in detergent or soaps/lotions.  We reviewed her labs listed below, we also discussed her health maintenance and I advised she get a new DEXA scan.    Patient Active Problem List   Diagnosis Date Noted   Rash and nonspecific skin eruption 01/05/2022   Vitamin D deficiency 01/03/2022   Pes anserinus bursitis of right knee 07/18/2019   Pain in right knee 06/20/2019   Osteoarthritis of right glenohumeral joint 06/30/2018   GAD (generalized anxiety disorder) 03/16/2018   Normocytic normochromic anemia 02/24/2018   Recurrent falls 01/18/2018   Bipolar disorder ([place]) 01/18/2018   Palpitations 12/02/2017   Syncope 10/26/2015   Weakness of both legs 02/15/2013   Orthostatic hypotension 02/15/2013   HAND PAIN 07/31/2008   Anemia 05/17/2008   GLUCOSE INTOLERANCE 93/57/0177   Systolic ejection murmur 93/90/3009   History of colonic polyps 05/17/2007   Hypothyroidism 02/05/2007   Dyslipidemia 02/05/2007     Current Outpatient Medications  Medication Instructions   betamethasone dipropionate (DIPROLENE) 0.05 % ointment Topical, 2 times daily   carbamazepine (EQUETRO) 400 mg, Oral, Daily   cholecalciferol (VITAMIN D3) 1,000 Units, Oral, Daily   clobetasol cream (TEMOVATE) 2.33 % 1 Application, Topical, 2 times daily   levothyroxine (SYNTHROID) 50 mcg, Oral, Daily   QUEtiapine (SEROQUEL) 300 mg, Oral, Daily at bedtime   rosuvastatin (CRESTOR) 10 mg,  Oral, Daily   SF 5000 PLUS 1.1 % CREA dental cream USE ONCE AT BEDTIME IN PLACE OF REGULAR TOOTHPASTE. BRUSH FOR 2 MINUTES EXPECTORATE. DO NOT RINSE   zaleplon (SONATA) 10 mg, Oral, At bedtime PRN     Review of Systems  All other systems reviewed and are negative.     Objective:     BP (!) 118/58 (BP Location: Left Arm, Patient Position: Sitting, Cuff Size: Large)   Pulse 64   Temp 97.6 F (36.4 C) (Oral)   Ht '5\' 8"'$  (1.727 m)   Wt 184 lb 11.2 oz (83.8 kg)   SpO2 98%   BMI 28.08 kg/m  BP Readings from Last 3 Encounters:  01/03/22 (!) 118/58  12/17/21 102/60  09/27/21 (!) 108/52      Physical Exam Vitals reviewed.  Constitutional:      Appearance: Normal appearance. She is well-groomed and normal weight.  HENT:     Head: Normocephalic and atraumatic.     Mouth/Throat:     Mouth: Mucous membranes are moist.     Pharynx: Oropharynx is clear.  Eyes:     Extraocular Movements: Extraocular movements intact.     Conjunctiva/sclera: Conjunctivae normal.     Pupils: Pupils are equal, round, and reactive to light.  Cardiovascular:     Rate and Rhythm: Normal rate and regular rhythm.     Pulses: Normal pulses.     Heart sounds: S1 normal and S2 normal.  Pulmonary:     Effort: Pulmonary effort is normal.     Breath  sounds: Normal breath sounds and air entry.  Abdominal:     General: Abdomen is flat. Bowel sounds are normal.     Palpations: Abdomen is soft.  Musculoskeletal:        General: Normal range of motion.     Cervical back: Normal range of motion and neck supple.     Right lower leg: No edema.     Left lower leg: No edema.  Skin:    General: Skin is warm and dry.     Findings: Rash (mild healing slightly erythematous rash on the forearms, there is a small amount of excoriation. not raised, no purpura, no other acute findings) present.  Neurological:     Mental Status: She is alert and oriented to person, place, and time. Mental status is at baseline.     Gait:  Gait is intact.  Psychiatric:        Mood and Affect: Mood and affect normal.        Speech: Speech normal.        Behavior: Behavior normal.        Judgment: Judgment normal.      No results found for any visits on 01/03/22.  Last metabolic panel Lab Results  Component Value Date   GLUCOSE 92 09/27/2021   NA 141 09/27/2021   K 4.1 09/27/2021   CL 104 09/27/2021   CO2 28 09/27/2021   BUN 24 (H) 09/27/2021   CREATININE 1.01 09/27/2021   GFRNONAA >60 02/24/2018   CALCIUM 8.7 09/27/2021   PROT 6.3 09/27/2021   ALBUMIN 3.7 09/27/2021   BILITOT 0.2 09/27/2021   ALKPHOS 79 09/27/2021   AST 22 09/27/2021   ALT 20 09/27/2021   ANIONGAP 8 02/24/2018   Last lipids Lab Results  Component Value Date   CHOL 214 (H) 09/27/2021   HDL 61.90 09/27/2021   LDLCALC 127 (H) 12/26/2020   LDLDIRECT 125.0 09/27/2021   TRIG 223.0 (H) 09/27/2021   CHOLHDL 3 09/27/2021   Last thyroid functions Lab Results  Component Value Date   TSH 5.10 01/02/2022   Last vitamin D Lab Results  Component Value Date   VD25OH 26.11 (L) 09/27/2021      The 10-year ASCVD risk score (Arnett DK, et al., 2019) is: 19.2%    Assessment & Plan:   Problem List Items Addressed This Visit       Cardiovascular and Mediastinum   Orthostatic hypotension - Primary    Patient denies any episodes of dizziness or syncope. BP today WNL. I advised she increase fluid intake and wear compression stockings to help support her blood pressure        Musculoskeletal and Integument   Rash and nonspecific skin eruption    Most likely a dermatitis of some kind, it appears to be healing. I advised she speak with her dermatologist and continue the steroid ointment she was given since it looks like it is healing.        Other   Dyslipidemia   Bipolar disorder ([place])   Vitamin D deficiency    New finding on labs in May, I advised she take an OTC vitamin D supplement daily. I have ordered a new DEXA scan for the  patient also.      Other Visit Diagnoses     Postmenopausal state       Relevant Orders   DG Bone Density       Return in about 6 months (around 07/06/2022) for follow up visit.  Farrel Conners, MD

## 2022-01-03 NOTE — Patient Instructions (Signed)
Drink more water, at least 32 ounces more per day.

## 2022-01-05 DIAGNOSIS — R21 Rash and other nonspecific skin eruption: Secondary | ICD-10-CM | POA: Insufficient documentation

## 2022-01-05 NOTE — Assessment & Plan Note (Signed)
Patient denies any episodes of dizziness or syncope. BP today WNL. I advised she increase fluid intake and wear compression stockings to help support her blood pressure

## 2022-01-05 NOTE — Assessment & Plan Note (Signed)
Most likely a dermatitis of some kind, it appears to be healing. I advised she speak with her dermatologist and continue the steroid ointment she was given since it looks like it is healing.

## 2022-01-05 NOTE — Assessment & Plan Note (Signed)
New finding on labs in May, I advised she take an OTC vitamin D supplement daily. I have ordered a new DEXA scan for the patient also.

## 2022-01-13 DIAGNOSIS — L810 Postinflammatory hyperpigmentation: Secondary | ICD-10-CM | POA: Diagnosis not present

## 2022-01-13 DIAGNOSIS — L309 Dermatitis, unspecified: Secondary | ICD-10-CM | POA: Diagnosis not present

## 2022-01-28 ENCOUNTER — Telehealth: Payer: Self-pay | Admitting: Psychiatry

## 2022-01-28 NOTE — Telephone Encounter (Signed)
Next visit is 04/09/22. For a couple of weeks Rebecca Novak states that she has had trouble sleeping. She goes to bed around midnight and is up about 5 or 6 am. Is there something that she can be given to help her sleep better. Her pharmacy is:  [facility] [address] [place] - [address]  Phone:  [phone]  Fax:  [phone]

## 2022-01-28 NOTE — Telephone Encounter (Signed)
LVM to call back.I asked pt in message if she was still taking sonata,waiting on a call back

## 2022-03-19 ENCOUNTER — Telehealth: Payer: Self-pay | Admitting: Psychiatry

## 2022-03-19 NOTE — Telephone Encounter (Signed)
LVM to RC 

## 2022-03-19 NOTE — Telephone Encounter (Signed)
Pts sister, [name], called. She had spoken with Belenda Cruise today and does not think she is doing very well. She is not sure that she is taking her meds correctly. She mentioned she was not taking carbamazepine anymore. Can we do a welfare check call?

## 2022-03-20 NOTE — Telephone Encounter (Signed)
Called patient and she said she is doing well. [facility] and said she is pleased with how well she is doing. She lost her husband 2 years ago and his BD is coming up, which makes her sad. She said she really misses him. She is only taking 2 medications from [facility] and she is taking as prescribed. She was very lucid.  I did not tell her that [name] had called but she told me that [name] had told her she was going to call about her medications.

## 2022-04-09 ENCOUNTER — Ambulatory Visit (INDEPENDENT_AMBULATORY_CARE_PROVIDER_SITE_OTHER): Payer: Medicare Other | Admitting: Psychiatry

## 2022-04-09 ENCOUNTER — Encounter: Payer: Self-pay | Admitting: Psychiatry

## 2022-04-09 DIAGNOSIS — F319 Bipolar disorder, unspecified: Secondary | ICD-10-CM

## 2022-04-09 DIAGNOSIS — F5104 Psychophysiologic insomnia: Secondary | ICD-10-CM | POA: Diagnosis not present

## 2022-04-09 DIAGNOSIS — G3184 Mild cognitive impairment, so stated: Secondary | ICD-10-CM | POA: Diagnosis not present

## 2022-04-09 DIAGNOSIS — F411 Generalized anxiety disorder: Secondary | ICD-10-CM | POA: Diagnosis not present

## 2022-04-09 MED ORDER — EQUETRO 200 MG PO CP12: 400.0000 mg | ORAL_CAPSULE | Freq: Every day | ORAL | 1 refills | Status: DC

## 2022-04-09 MED ORDER — QUETIAPINE FUMARATE 300 MG PO TABS: 300.0000 mg | ORAL_TABLET | Freq: Every day | ORAL | 1 refills | Status: DC
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
## 2022-05-06 ENCOUNTER — Other Ambulatory Visit: Payer: Self-pay | Admitting: Family Medicine

## 2022-05-06 ENCOUNTER — Ambulatory Visit (INDEPENDENT_AMBULATORY_CARE_PROVIDER_SITE_OTHER): Payer: Medicare Other

## 2022-05-06 VITALS — Ht 68.0 in | Wt 180.0 lb

## 2022-05-06 DIAGNOSIS — Z0000 Encounter for general adult medical examination without abnormal findings: Secondary | ICD-10-CM | POA: Diagnosis not present

## 2022-05-06 MED ORDER — LEVOTHYROXINE SODIUM 50 MCG PO TABS
50.0000 ug | ORAL_TABLET | Freq: Every day | ORAL | 1 refills | Status: DC
Start: 2022-05-06 — End: 2022-11-26

## 2022-05-06 MED ORDER — ROSUVASTATIN CALCIUM 10 MG PO TABS
10.0000 mg | ORAL_TABLET | Freq: Every day | ORAL | 1 refills | Status: DC
Start: 2022-05-06 — End: 2022-11-26

## 2022-05-06 NOTE — Telephone Encounter (Signed)
Refill levothyroxine (SYNTHROID) 50 MCG tablet rosuvastatin (CRESTOR) 10 MG tablet  [facility] [address] [place] [address] Phone: [phone]  Fax: [phone]

## 2022-05-06 NOTE — Patient Instructions (Addendum)
Rebecca Novak , Thank you for taking time to come for your Medicare Wellness Visit. I appreciate your ongoing commitment to your health goals. Please review the following plan we discussed and let me know if I can assist you in the future.   These are the goals we discussed:  Goals       Exercise 150 min/wk Moderate Activity      Will start walking in the neighborhood. In the early afternoon.      Patient Stated (pt-stated)      Stay healthy.        This is a list of the screening recommended for you and due dates:  Health Maintenance  Topic Date Due   DTaP/Tdap/Td vaccine (3 - Td or Tdap) 01/03/2022   COVID-19 Vaccine (3 - Pfizer risk series) 05/22/2022*   Flu Shot  08/24/2022*   Zoster (Shingles) Vaccine (2 of 2) 05/09/2022   Medicare Annual Wellness Visit  05/07/2023   Colon Cancer Screening  10/23/2024   Pneumonia Vaccine  Completed   DEXA scan (bone density measurement)  Completed   Hepatitis C Screening: USPSTF Recommendation to screen - Ages 25-79 yo.  Completed   HPV Vaccine  Aged Out  *Topic was postponed. The date shown is not the original due date.    Advanced directives: In chart  Conditions/risks identified: None  Next appointment: Follow up in one year for your annual wellness visit     Preventive Care 65 Years and Older, Female Preventive care refers to lifestyle choices and visits with your health care provider that can promote health and wellness. What does preventive care include? A yearly physical exam. This is also called an annual well check. Dental exams once or twice a year. Routine eye exams. Ask your health care provider how often you should have your eyes checked. Personal lifestyle choices, including: Daily care of your teeth and gums. Regular physical activity. Eating a healthy diet. Avoiding tobacco and drug use. Limiting alcohol use. Practicing safe sex. Taking low-dose aspirin every day. Taking vitamin and mineral supplements as  recommended by your health care provider. What happens during an annual well check? The services and screenings done by your health care provider during your annual well check will depend on your age, overall health, lifestyle risk factors, and family history of disease. Counseling  Your health care provider may ask you questions about your: Alcohol use. Tobacco use. Drug use. Emotional well-being. Home and relationship well-being. Sexual activity. Eating habits. History of falls. Memory and ability to understand (cognition). Work and work Statistician. Reproductive health. Screening  You may have the following tests or measurements: Height, weight, and BMI. Blood pressure. Lipid and cholesterol levels. These may be checked every 5 years, or more frequently if you are over 15 years old. Skin check. Lung cancer screening. You may have this screening every year starting at age 78 if you have a 30-pack-year history of smoking and currently smoke or have quit within the past 15 years. Fecal occult blood test (FOBT) of the stool. You may have this test every year starting at age 78. Flexible sigmoidoscopy or colonoscopy. You may have a sigmoidoscopy every 5 years or a colonoscopy every 10 years starting at age 60. Hepatitis C blood test. Hepatitis B blood test. Sexually transmitted disease (STD) testing. Diabetes screening. This is done by checking your blood sugar (glucose) after you have not eaten for a while (fasting). You may have this done every 1-3 years. Bone density scan. This  is done to screen for osteoporosis. You may have this done starting at age 25. Mammogram. This may be done every 1-2 years. Talk to your health care provider about how often you should have regular mammograms. Talk with your health care provider about your test results, treatment options, and if necessary, the need for more tests. Vaccines  Your health care provider may recommend certain vaccines, such  as: Influenza vaccine. This is recommended every year. Tetanus, diphtheria, and acellular pertussis (Tdap, Td) vaccine. You may need a Td booster every 10 years. Zoster vaccine. You may need this after age 49. Pneumococcal 13-valent conjugate (PCV13) vaccine. One dose is recommended after age 42. Pneumococcal polysaccharide (PPSV23) vaccine. One dose is recommended after age 15. Talk to your health care provider about which screenings and vaccines you need and how often you need them. This information is not intended to replace advice given to you by your health care provider. Make sure you discuss any questions you have with your health care provider. Document Released: 06/08/2015 Document Revised: 01/30/2016 Document Reviewed: 03/13/2015 Elsevier Interactive Patient Education  2017 [place] Prevention in the Home Falls can cause injuries. They can happen to people of all ages. There are many things you can do to make your home safe and to help prevent falls. What can I do on the outside of my home? Regularly fix the edges of walkways and driveways and fix any cracks. Remove anything that might make you trip as you walk through a door, such as a raised step or threshold. Trim any bushes or trees on the path to your home. Use bright outdoor lighting. Clear any walking paths of anything that might make someone trip, such as rocks or tools. Regularly check to see if handrails are loose or broken. Make sure that both sides of any steps have handrails. Any raised decks and porches should have guardrails on the edges. Have any leaves, snow, or ice cleared regularly. Use sand or salt on walking paths during winter. Clean up any spills in your garage right away. This includes oil or grease spills. What can I do in the bathroom? Use night lights. Install grab bars by the toilet and in the tub and shower. Do not use towel bars as grab bars. Use non-skid mats or decals in the tub or  shower. If you need to sit down in the shower, use a plastic, non-slip stool. Keep the floor dry. Clean up any water that spills on the floor as soon as it happens. Remove soap buildup in the tub or shower regularly. Attach bath mats securely with double-sided non-slip rug tape. Do not have throw rugs and other things on the floor that can make you trip. What can I do in the bedroom? Use night lights. Make sure that you have a light by your bed that is easy to reach. Do not use any sheets or blankets that are too big for your bed. They should not hang down onto the floor. Have a firm chair that has side arms. You can use this for support while you get dressed. Do not have throw rugs and other things on the floor that can make you trip. What can I do in the kitchen? Clean up any spills right away. Avoid walking on wet floors. Keep items that you use a lot in easy-to-reach places. If you need to reach something above you, use a strong step stool that has a grab bar. Keep electrical cords out  of the way. Do not use floor polish or wax that makes floors slippery. If you must use wax, use non-skid floor wax. Do not have throw rugs and other things on the floor that can make you trip. What can I do with my stairs? Do not leave any items on the stairs. Make sure that there are handrails on both sides of the stairs and use them. Fix handrails that are broken or loose. Make sure that handrails are as long as the stairways. Check any carpeting to make sure that it is firmly attached to the stairs. Fix any carpet that is loose or worn. Avoid having throw rugs at the top or bottom of the stairs. If you do have throw rugs, attach them to the floor with carpet tape. Make sure that you have a light switch at the top of the stairs and the bottom of the stairs. If you do not have them, ask someone to add them for you. What else can I do to help prevent falls? Wear shoes that: Do not have high heels. Have  rubber bottoms. Are comfortable and fit you well. Are closed at the toe. Do not wear sandals. If you use a stepladder: Make sure that it is fully opened. Do not climb a closed stepladder. Make sure that both sides of the stepladder are locked into place. Ask someone to hold it for you, if possible. Clearly mark and make sure that you can see: Any grab bars or handrails. First and last steps. Where the edge of each step is. Use tools that help you move around (mobility aids) if they are needed. These include: Canes. Walkers. Scooters. Crutches. Turn on the lights when you go into a dark area. Replace any light bulbs as soon as they burn out. Set up your furniture so you have a clear path. Avoid moving your furniture around. If any of your floors are uneven, fix them. If there are any pets around you, be aware of where they are. Review your medicines with your doctor. Some medicines can make you feel dizzy. This can increase your chance of falling. Ask your doctor what other things that you can do to help prevent falls. This information is not intended to replace advice given to you by your health care provider. Make sure you discuss any questions you have with your health care provider. Document Released: 03/08/2009 Document Revised: 10/18/2015 Document Reviewed: 06/16/2014 Elsevier Interactive Patient Education  2017 [employer].

## 2022-05-06 NOTE — Progress Notes (Signed)
Subjective:   Rebecca Novak is a 76 y.o. female who presents for Medicare Annual (Subsequent) preventive examination.  Review of Systems    Virtual Visit via Telephone Note  I connected with  Rebecca Novak on 05/06/22 at  1:00 PM EST by telephone and verified that I am speaking with the correct person using two identifiers.  Location: Patient: Home Provider: Office Persons participating in the virtual visit: patient/Nurse Health Advisor   I discussed the limitations, risks, security and privacy concerns of performing an evaluation and management service by telephone and the availability of in person appointments. The patient expressed understanding and agreed to proceed.  Interactive audio and video telecommunications were attempted between this nurse and patient, however failed, due to patient having technical difficulties OR patient did not have access to video capability.  We continued and completed visit with audio only.  Some vital signs may be absent or patient reported.   Criselda Peaches, LPN  Cardiac Risk Factors include: advanced age (>5mn, >>68women);Other (see comment), Risk factor comments: Orthostatic Hypotention     Objective:    Today's Vitals   05/06/22 1310  Weight: 180 lb (81.6 kg)  Height: '5\' 8"'$  (1.727 m)   Body mass index is 27.37 kg/m.     05/06/2022    1:18 PM 05/03/2021    1:03 PM 04/11/2020    1:53 PM 02/24/2018    9:54 AM 02/18/2018    3:30 PM 02/04/2018   12:54 PM 01/19/2018   11:00 PM  Advanced Directives  Does Patient Have a Medical Advance Directive? Yes Yes Yes Yes Yes Yes Yes  Type of AParamedicof [place]Living will  Living will HPowerLiving will HBlue Ridge SummitLiving will [place]Living will   Does patient want to make changes to medical advance directive? No - Patient declined No - Patient declined  No - Patient declined  No - Patient  declined   Copy of [place] Chart? Yes - validated most recent copy scanned in chart (See row information)   No - copy requested No - copy requested No - copy requested     Current Medications (verified) Outpatient Encounter Medications as of 05/06/2022  Medication Sig   betamethasone dipropionate (DIPROLENE) 0.05 % ointment Apply topically 2 (two) times daily.   cholecalciferol (VITAMIN D3) 25 MCG (1000 UNIT) tablet Take 1,000 Units by mouth daily.   clobetasol cream (TEMOVATE) 08.65% Apply 1 Application topically 2 (two) times daily.   EQUETRO 200 MG CP12 12 hr capsule Take 2 capsules (400 mg total) by mouth daily.   levothyroxine (SYNTHROID) 50 MCG tablet Take 1 tablet (50 mcg total) by mouth daily.   QUEtiapine (SEROQUEL) 300 MG tablet Take 1 tablet (300 mg total) by mouth at bedtime.   rosuvastatin (CRESTOR) 10 MG tablet Take 1 tablet (10 mg total) by mouth daily.   SF 5000 PLUS 1.1 % CREA dental cream USE ONCE AT BEDTIME IN PLACE OF REGULAR TOOTHPASTE. BRUSH FOR 2 MINUTES EXPECTORATE. DO NOT RINSE   zaleplon (SONATA) 10 MG capsule Take 1 capsule (10 mg total) by mouth at bedtime as needed for sleep. (Patient not taking: Reported on 04/09/2022)   [DISCONTINUED] Calcium Carbonate (CALCIUM 500 PO) Take 2 tablets by mouth daily.     No facility-administered encounter medications on file as of 05/06/2022.    Allergies (verified) Patient has no known allergies.   History: Past Medical History:  Diagnosis Date   ANEMIA, MILD 05/17/2008   BCC (basal cell carcinoma) 08/08/2009   right cheek (CX35FU +exc. 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   upper back (CX35FU)   SCC (squamous cell carcinoma) 11/18/2007   right lower leg (CX35FU)   SCC (squamous cell carcinoma) 03/12/2011   left outer calf   SCC (squamous cell carcinoma) 11/19/2011   left side of chest -tx p bx   SCC (squamous cell carcinoma) 12/08/2012   left chest (CX35FU)   SCC (squamous cell carcinoma) 09/28/2014   left arm inferior (CX35FU), right arm, lateral (CX35FU)   SCC (squamous cell carcinoma) 04/25/2015   rigth forearm    SCC (squamous cell carcinoma) 01/10/2017   right shin proximal (CX35FU)   SCC (squamous cell carcinoma) 04/21/2018   top right hand-tx p bx   SYSTOLIC MURMUR 63/84/5364   Past Surgical History:  Procedure Laterality Date   APPENDECTOMY     TONSILLECTOMY     VAGINAL HYSTERECTOMY     uncertain reason.    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  Tobacco Use   Smoking status: Former    Packs/day: 0.33    Years: 44.00    Total pack years: 14.52    Types: Cigarettes   Smokeless tobacco: Never   Tobacco comments:    02/15/2013 "stopped smoking cigarettes ~ 7 yr ago"  Vaping Use   Vaping Use: Never used  Substance and Sexual Activity   Alcohol use: No   Drug use: No   Sexual activity: Yes  Other Topics Concern   Not on file  Social History Narrative   Not on file   Social Determinants of Health    Financial Resource Strain: Low Risk  (05/06/2022)   Overall Financial Resource Strain (CARDIA)    Difficulty of Paying Living Expenses: Not hard at all  Food Insecurity: No [place] (05/06/2022)   Hunger Vital Sign    Worried About Running Out of Food in the Last Year: Never true    [place] in the Last Year: Never true  Transportation Needs: No Transportation Needs (05/06/2022)   PRAPARE - Hydrologist (Medical): No    Lack of Transportation (Non-Medical): No  Physical Activity: Sufficiently Active (05/06/2022)   Exercise Vital Sign    Days of Exercise per  Week: 7 days    Minutes of Exercise per Session: 60 min  Stress: No Stress Concern Present (05/06/2022)   [place]    Feeling of Stress : Not at all  Social Connections: Moderately Integrated (05/06/2022)   Social Connection and Isolation Panel [NHANES]    Frequency of Communication with Friends and Family: More than three times a week    Frequency of Social Gatherings with Friends and Family: More than three times a week    Attends Religious Services: More than 4 times per year    Active Member of [employer] or Organizations: Yes    Attends Archivist Meetings: More than 4 times per year    Marital Status: Widowed    Tobacco Counseling Counseling given: Not Answered Tobacco comments: 02/15/2013 "stopped smoking cigarettes ~ 7 yr ago"   Clinical Intake:  Pre-visit preparation completed: Yes  Pain : No/denies pain     BMI - recorded: 27.37 Nutritional Status: BMI 25 -29 Overweight Nutritional Risks: None Diabetes: No  How often do you need to have someone help you when you read instructions, pamphlets, or other written materials from your doctor or pharmacy?: 1 - Never  Diabetic?  No  Interpreter Needed?: No  Information entered by :: Rolene Arbour LPN   Activities of Daily Living     05/06/2022    1:16 PM  In your present state of health, do you have any difficulty performing the following activities:  Hearing? 0  Vision? 0  Difficulty concentrating or making decisions? 0  Walking or climbing stairs? 0  Dressing or bathing? 0  Doing errands, shopping? 0  Preparing Food and eating ? N  Using the Toilet? N  In the past six months, have you accidently leaked urine? N  Do you have problems with loss of bowel control? N  Managing your Medications? N  Managing your Finances? N  Housekeeping or managing your Housekeeping? N    Patient Care Team: Farrel Conners, MD as PCP - General (Family Medicine) Sherren Mocha, MD as PCP - Cardiology (Cardiology) Cottle, Billey Co., MD as Attending Physician (Psychiatry) Viona Gilmore, [facility] as Pharmacist (Pharmacist) Lavonna Monarch, MD (Inactive) as Consulting Physician (Dermatology)  Indicate any recent Medical Services you may have received from other than [facility] providers in the past year (date may be approximate).     Assessment:   This is a routine wellness examination for [facility].  Hearing/Vision screen Hearing Screening - Comments:: Denies hearing difficulties   Vision Screening - Comments::  - up to date with routine eye exams with  Dr Katy Fitch  Dietary issues and exercise activities discussed: Exercise limited by: None identified   Goals Addressed               This Visit's Progress     Patient Stated (pt-stated)        Stay healthy.       Depression Screen    12/17/2021   11:05 AM 05/03/2021    1:00 PM 04/11/2020    1:51 PM 02/22/2018   11:47 AM 02/04/2018   12:50 PM 08/05/2017    3:35 PM 06/26/2017    4:07 PM  PHQ 2/9 Scores  PHQ - 2 Score 0 0 0 0 0 0 0  PHQ- 9 Score 0      0    Fall Risk    05/06/2022    1:18 PM 12/17/2021   11:05 AM 05/03/2021  1:02 PM 04/11/2020    1:53 PM 02/18/2018   12:31 PM  Fall Risk   Falls in the past year? 0 0 0 0 Yes  Number falls in past yr: 0 0 0 0 2  or more  Injury with Fall? 0 0 0 0 No  Risk Factor Category      High Fall Risk  Risk for fall due to : No Fall Risks No Fall Risks  Impaired vision History of fall(s);Impaired balance/gait  Follow up Falls prevention discussed Falls evaluation completed  Falls prevention discussed     FALL RISK PREVENTION PERTAINING TO THE HOME:  Any stairs in or around the home? Yes  If so, are there any without handrails? No  Home free of loose throw rugs in walkways, pet beds, electrical cords, etc? Yes  Adequate lighting in your home to reduce risk of falls? Yes   ASSISTIVE DEVICES UTILIZED TO PREVENT FALLS:  Life alert? No  Use of a cane, walker or w/c? No  Grab bars in the bathroom? No  Shower chair or bench in shower? No  Elevated toilet seat or a handicapped toilet? No   TIMED UP AND GO:  Was the test performed? No . Audio Visit    Cognitive Function:    03/26/2018   10:47 AM  MMSE - Mini Mental State Exam  Orientation to time   Orientation to Place   Registration   Attention/ Calculation   Recall   Language- name 2 objects   Language- repeat   Language- follow 3 step command   Language- read & follow direction   Write a sentence   Copy design   Total score      Information is confidential and restricted. Go to Review Flowsheets to unlock data.        05/06/2022    1:18 PM 05/03/2021    1:03 PM 04/11/2020    1:56 PM  6CIT Screen  What Year? 0 points 0 points 0 points  What month? 0 points 0 points 0 points  What time? 0 points 0 points   Count back from 20 0 points 0 points 0 points  Months in reverse 2 points 0 points 4 points  Repeat phrase 0 points 0 points 0 points  Total Score 2 points 0 points     Immunizations Immunization History  Administered Date(s) Administered   Fluad Quad(high Dose 65+) 03/23/2019   Influenza Split 03/06/2011, 03/16/2012   Influenza Whole 03/05/2007, 03/07/2008, 02/22/2009, 02/14/2010   Influenza, High Dose Seasonal PF  03/06/2015, 03/17/2016, 02/26/2017, 03/04/2018   Influenza,inj,Quad PF,6+ Mos 03/18/2013, 03/23/2014   Influenza,inj,quad, With Preservative 02/23/2018   Influenza-Unspecified 03/05/2020   PFIZER(Purple Top)SARS-COV-2 Vaccination 07/17/2019, 08/10/2019   PNEUMOCOCCAL CONJUGATE-20 03/14/2022   Pneumococcal Conjugate-13 02/13/2014   Pneumococcal Polysaccharide-23 12/18/2010   Td 11/08/2008   Tdap 01/04/2012   Zoster Recombinat (Shingrix) 03/14/2022   Zoster, Live 05/16/2015    TDAP status: Due, Education has been provided regarding the importance of this vaccine. Advised may receive this vaccine at local pharmacy or Health Dept. Aware to provide a copy of the vaccination record if obtained from local pharmacy or Health Dept. Verbalized acceptance and understanding.  Flu Vaccine status: Due, Education has been provided regarding the importance of this vaccine. Advised may receive this vaccine at local pharmacy or Health Dept. Aware to provide a copy of the vaccination record if obtained from local pharmacy or Health Dept. Verbalized acceptance and understanding.  Pneumococcal vaccine status: Up to date  Covid-19 vaccine status: Completed vaccines  Qualifies for Shingles Vaccine? Yes   Zostavax completed Yes   Shingrix Completed?: Yes  Screening Tests Health Maintenance  Topic Date Due   DTaP/Tdap/Td (3 - Td or Tdap) 01/03/2022   COVID-19 Vaccine (3 - Pfizer risk series) 05/22/2022 (Originally 09/07/2019)   INFLUENZA VACCINE  08/24/2022 (Originally 12/24/2021)   Zoster Vaccines- Shingrix (2 of 2) 05/09/2022   Medicare Annual Wellness (AWV)  05/07/2023   COLONOSCOPY (Pts 45-52yr Insurance coverage will need to be confirmed)  10/23/2024   Pneumonia Vaccine 76 Years old  Completed   DEXA SCAN  Completed   Hepatitis C Screening  Completed   HPV VACCINES  Aged Out    Health Maintenance  Health Maintenance Due  Topic Date Due   DTaP/Tdap/Td (3 - Td or Tdap) 01/03/2022     Colorectal cancer screening: No longer required.   Mammogram status: No longer required due to Age.  Bone Density status: Ordered 01/03/22. Pt provided with contact info and advised to call to schedule appt.  Lung Cancer Screening: (Low Dose CT Chest recommended if Age 78-80years, 30 pack-year currently smoking OR have quit w/in 15years.) does not qualify.     Additional Screening:  Hepatitis C Screening: does qualify; Completed 12/24/11  Vision Screening: Recommended annual ophthalmology exams for early detection of glaucoma and other disorders of the eye. Is the patient up to date with their annual eye exam?  Yes  Who is the provider or what is the name of the office in which the patient attends annual eye exams? Dr GKaty FitchIf pt is not established with a provider, would they like to be referred to a provider to establish care? No .   Dental Screening: Recommended annual dental exams for proper oral hygiene  Community Resource Referral / Chronic Care Management:  CRR required this visit?  No   CCM required this visit?  No      Plan:     I have personally reviewed and noted the following in the patient's chart:   Medical and social history Use of alcohol, tobacco or illicit drugs  Current medications and supplements including opioid prescriptions. Patient is not currently taking opioid prescriptions. Functional ability and status Nutritional status Physical activity Advanced directives List of other physicians Hospitalizations, surgeries, and ER visits in previous 12 months Vitals Screenings to include cognitive, depression, and falls Referrals and appointments  In addition, I have reviewed and discussed with patient certain preventive protocols, quality metrics, and best practice recommendations. A written personalized care plan for preventive services as well as general preventive health recommendations were provided to patient.     BCriselda Peaches  LPN   [id number]  Nurse Notes: Patient due Dtap/Tdap/(3-Td or Tdap)

## 2022-05-15 DIAGNOSIS — H2513 Age-related nuclear cataract, bilateral: Secondary | ICD-10-CM | POA: Diagnosis not present

## 2022-06-19 ENCOUNTER — Telehealth: Payer: Self-pay | Admitting: Psychiatry
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
## 2022-06-19 NOTE — Telephone Encounter (Signed)
I cannot have it finished by the morning.  If I have time I can try to do it tomorrow.

## 2022-06-20 ENCOUNTER — Telehealth: Payer: Self-pay | Admitting: Psychiatry

## 2022-06-20 ENCOUNTER — Other Ambulatory Visit: Payer: Self-pay | Admitting: Psychiatry

## 2022-06-20 MED ORDER — LORAZEPAM 0.5 MG PO TABS: ORAL_TABLET | ORAL | 0 refills | Status: DC

## 2022-06-20 NOTE — Telephone Encounter (Signed)
Don't worry about this letter. [place] called back late yesterday and felt like it wouldn't be helpful anyway.

## 2022-06-20 NOTE — Telephone Encounter (Signed)
She has been under some increased stress lately and this is causing anxiety and insomnia.  I will give her a few lorazepam 0.5 mg tablets 1:59 at night as needed for sleep.  She cannot stay on it long term but I will give her enough to take for a week.

## 2022-06-20 NOTE — Telephone Encounter (Signed)
Pt stated she has a lot going on in her life right now.She is having trouble falling asleep ,it takes hours for her to fall asleep and she is not resting well.She is no longer taking sonata,but still takes Seroquel 300 mg HS.She stated she needs a good nights sleep.

## 2022-06-20 NOTE — Telephone Encounter (Signed)
LVM with info and to rtc with any questions

## 2022-06-20 NOTE — Telephone Encounter (Signed)
LVM to rtc 

## 2022-06-20 NOTE — Telephone Encounter (Signed)
Pt called and said that she needs something for sleep. She has not slept the past two nights. Please send sleep medicine to [facility] on [address]

## 2022-07-07 ENCOUNTER — Ambulatory Visit: Payer: Medicare Other | Admitting: Family Medicine

## 2022-08-13 ENCOUNTER — Telehealth: Payer: Self-pay | Admitting: Psychiatry

## 2022-08-13 NOTE — Telephone Encounter (Signed)
Rebecca Novak called at 11:00 wanting to know if you think she still needs to be on the Equetro and the Seroquel.  She is getting to request refills from her mail order and she doesn't want to spend the money on the medications if you think she won't need them anymore.  She has appt 4/17.  Please call

## 2022-08-13 NOTE — Telephone Encounter (Signed)
Patient asking about long-term use of Equetro and Seroquel. She needs to get Rx thru mail order and doesn't want to spend the money for 68-month supply if she will not need the medication.    From November note:  Needs to stay on Equetro bc history of carbamazepine toxicity.  You also mentioned that she was sleeping better on 300 mg quetiapine.

## 2022-08-14 NOTE — Telephone Encounter (Signed)
It would be very risky to stop either medication.  She would have severe insomnia if she stops the quetiapine and mood swings without Equetro.

## 2022-08-14 NOTE — Telephone Encounter (Signed)
LVM to RC 

## 2022-08-15 NOTE — Telephone Encounter (Signed)
Notified patient of Dr. Casimiro Needle recommendations.

## 2022-08-15 NOTE — Telephone Encounter (Signed)
Left a second VM to RC.  

## 2022-09-10 ENCOUNTER — Ambulatory Visit (INDEPENDENT_AMBULATORY_CARE_PROVIDER_SITE_OTHER): Payer: Medicare Other | Admitting: Psychiatry

## 2022-09-10 ENCOUNTER — Encounter: Payer: Self-pay | Admitting: Psychiatry

## 2022-09-10 DIAGNOSIS — R7989 Other specified abnormal findings of blood chemistry: Secondary | ICD-10-CM

## 2022-09-10 DIAGNOSIS — F5104 Psychophysiologic insomnia: Secondary | ICD-10-CM

## 2022-09-10 DIAGNOSIS — F411 Generalized anxiety disorder: Secondary | ICD-10-CM | POA: Diagnosis not present

## 2022-09-10 DIAGNOSIS — F319 Bipolar disorder, unspecified: Secondary | ICD-10-CM | POA: Diagnosis not present

## 2022-09-10 MED ORDER — EQUETRO 200 MG PO CP12: 400.0000 mg | ORAL_CAPSULE | Freq: Every day | ORAL | 1 refills | Status: DC

## 2022-09-10 MED ORDER — QUETIAPINE FUMARATE 300 MG PO TABS: 300.0000 mg | ORAL_TABLET | Freq: Every day | ORAL | 1 refills | Status: DC
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
## 2022-09-22 ENCOUNTER — Telehealth: Payer: Self-pay | Admitting: Family Medicine

## 2022-09-22 NOTE — Telephone Encounter (Signed)
I called the patient to see how I can help her, she stated she would rather speak with Rebecca Novak and will call back.

## 2022-09-22 NOTE — Telephone Encounter (Signed)
Pt called, requesting to speak with CMA. CMA was with a patient. Pt did not want to disclose reason for call.  Pt asked that CMA call back at her earliest convenience.

## 2022-10-14 DIAGNOSIS — H2513 Age-related nuclear cataract, bilateral: Secondary | ICD-10-CM | POA: Diagnosis not present

## 2022-10-15 DIAGNOSIS — L570 Actinic keratosis: Secondary | ICD-10-CM | POA: Diagnosis not present

## 2022-10-15 DIAGNOSIS — L819 Disorder of pigmentation, unspecified: Secondary | ICD-10-CM | POA: Diagnosis not present

## 2022-10-15 DIAGNOSIS — L309 Dermatitis, unspecified: Secondary | ICD-10-CM | POA: Diagnosis not present

## 2022-10-15 DIAGNOSIS — L814 Other melanin hyperpigmentation: Secondary | ICD-10-CM | POA: Diagnosis not present

## 2022-11-19 ENCOUNTER — Telehealth: Payer: Self-pay | Admitting: Psychiatry

## 2022-11-19 NOTE — Telephone Encounter (Signed)
Rebecca Novak called at 11:15 to request refill of her Quetiapine. [facility] [place] [place] - [address] [facility] AT Portal to Registered [facility] Sites pt 10/17.  Send to

## 2022-11-19 NOTE — Telephone Encounter (Signed)
Patient has RF available. Last filled 4/17. Asked that RF be sent.

## 2022-11-19 NOTE — Telephone Encounter (Signed)
Patient has refills available at [facility]. LVM to RC.

## 2022-11-20 NOTE — Telephone Encounter (Signed)
Rebecca Novak called again at 2:15 and said that  [facility] [address] [place] - [address]   Has the medicine as long as it is just regular and not an XL etc.  So we can call in a refill there until [facility] gets the medication.  Please let her know this has been done.  She is anxious about this.

## 2022-11-20 NOTE — Telephone Encounter (Signed)
LVM to RC. Rx is waiting for [employer] to pick up. Tracking should be updated within 48 hours and patient will be notified. Patient had called yesterday and said they didn't have an Rx and that it had been canceled. I'm not sure where she is getting her information, but they did have a Rx and I asked them yesterday to fill it.

## 2022-11-20 NOTE — Telephone Encounter (Signed)
Patient returned call to office regarding prior message. States that [employer] no longer has Quetiapine 30mg . She inquired about trying something else. She is down to last 9 pills and would like a return call ASAP. Ph: [phone]

## 2022-11-20 NOTE — Telephone Encounter (Signed)
Left message on VM per DPR with this information.

## 2022-11-24 ENCOUNTER — Other Ambulatory Visit: Payer: Self-pay | Admitting: Family Medicine

## 2022-12-05 ENCOUNTER — Ambulatory Visit: Payer: Medicare Other | Admitting: Family Medicine

## 2022-12-05 ENCOUNTER — Encounter: Payer: Self-pay | Admitting: Family Medicine

## 2022-12-05 VITALS — BP 130/72 | HR 65 | Temp 98.5°F | Ht 67.75 in | Wt 186.3 lb

## 2022-12-05 DIAGNOSIS — M19011 Primary osteoarthritis, right shoulder: Secondary | ICD-10-CM | POA: Diagnosis not present

## 2022-12-05 DIAGNOSIS — E785 Hyperlipidemia, unspecified: Secondary | ICD-10-CM

## 2022-12-05 DIAGNOSIS — I358 Other nonrheumatic aortic valve disorders: Secondary | ICD-10-CM | POA: Insufficient documentation

## 2022-12-05 DIAGNOSIS — E559 Vitamin D deficiency, unspecified: Secondary | ICD-10-CM | POA: Diagnosis not present

## 2022-12-05 DIAGNOSIS — E039 Hypothyroidism, unspecified: Secondary | ICD-10-CM | POA: Diagnosis not present

## 2022-12-05 LAB — CBC WITH DIFFERENTIAL/PLATELET
Absolute Monocytes: 422 cells/uL (ref 200–950)
HCT: 37.9 % (ref 35.0–45.0)
MCH: 29 pg (ref 27.0–33.0)
MPV: 10 fL (ref 7.5–12.5)
Monocytes Relative: 6.7 %
Platelets: 219 10*3/uL (ref 140–400)
RDW: 13 % (ref 11.0–15.0)

## 2022-12-05 NOTE — Progress Notes (Signed)
Established Patient Office Visit  Subjective   Patient ID: Rebecca Novak, female    DOB: 10/07/1945  Age: 77 y.o. MRN: [id number]  Chief Complaint  Patient presents with   Annual Exam    Patient is here for follow up today. Patient is asking for a letter to the trash company so that they will help take her trach cans back to her house up her driveway. She is here in the visit with her sister. Sister reports that she would like a referral to a nutritionist to help her with her diet. Also she has had problems with sleep chronically. She was concerned about the possibility of developing dementia, states that she thought about a referral to rule out sleep apnea. Patient states she mostly had trouble falling asleep -- sometimes it will be 3-4 in the morning, however pt states that she does not have nighttime awakenings, states she is relatively rested when she wakes up.   Hypothyroidism -- pt reports no new symptoms, reports compliance with her thyroid medication.  OA of shouder- pt reports increasing pain in the shoulder joint. States that she has seen ortho in the past and had injections, states that she would like to go back to see them again and get another injection. Referral placed.   I reviewed her medications and her last labs, she is coming up due for several labs.     Current Outpatient Medications  Medication Instructions   cholecalciferol (VITAMIN D3) 1,000 Units, Oral, Daily   clobetasol cream (TEMOVATE) 0.05 % 1 Application, Topical, 2 times daily   Equetro 400 mg, Oral, Daily   levothyroxine (SYNTHROID) 50 mcg, Oral, Daily   QUEtiapine (SEROQUEL) 300 mg, Oral, Daily at bedtime   rosuvastatin (CRESTOR) 10 mg, Oral, Daily   SF 5000 PLUS 1.1 % CREA dental cream USE ONCE AT BEDTIME IN PLACE OF REGULAR TOOTHPASTE. BRUSH FOR 2 MINUTES EXPECTORATE. 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   Rash and nonspecific  skin eruption 01/05/2022   Vitamin D deficiency 01/03/2022   Pes anserinus bursitis of right knee 07/18/2019   Pain in right knee 06/20/2019   Osteoarthritis of right glenohumeral joint 06/30/2018   GAD (generalized anxiety disorder) 03/16/2018   Normocytic normochromic anemia 02/24/2018   Recurrent falls 01/18/2018   Bipolar disorder (HCC) 01/18/2018   Palpitations 12/02/2017   Syncope 10/26/2015   Weakness of both legs 02/15/2013   Orthostatic hypotension 02/15/2013   HAND PAIN 07/31/2008   Anemia 05/17/2008   GLUCOSE INTOLERANCE 11/23/2007   Systolic ejection murmur 05/17/2007   History of colonic polyps 05/17/2007   Hypothyroidism 02/05/2007   Dyslipidemia 02/05/2007      Review of Systems  All other systems reviewed and are negative.     Objective:     BP 130/72 (BP Location: Left Arm, Patient Position: Sitting, Cuff Size: Large)   Pulse 65   Temp 98.5 F (36.9 C) (Oral)   Ht 5' 7.75" (1.721 m)   Wt 186 lb 4.8 oz (84.5 kg)   SpO2 95%   BMI 28.54 kg/m    Physical Exam Vitals reviewed.  Eyes:     Pupils: Pupils are equal, round, and reactive to light.  Neck:     Thyroid: No thyromegaly.  Cardiovascular:     Rate and Rhythm: Normal rate and regular rhythm.     Pulses: Normal pulses.     Heart sounds: Murmur  heard.  Musculoskeletal:     Right lower leg: Edema (1+ pitting edema in the ankles BL) present.     Left lower leg: Edema present.  Neurological:     Mental Status: She is oriented to person, place, and time. Mental status is at baseline.  Psychiatric:        Mood and Affect: Mood normal.        Behavior: Behavior normal.       The 10-year ASCVD risk score (Arnett DK, et al., 2019) is: 25.1%    Assessment & Plan:  Dyslipidemia Assessment & Plan: Needs new lipid panel today, on rosuvastatin 10 mg daily, no side effects reported will continue this medication  Orders: -     Amb ref to Medical Nutrition Therapy-MNT -     Comprehensive  metabolic panel; Future -     Lipid panel; Future  Acquired hypothyroidism Assessment & Plan: Needs new TSH today, currently on 50 mcg daily, will continue this dose -- will adjust based on the TSH results.   Orders: -     Amb ref to Medical Nutrition Therapy-MNT -     CBC with Differential/Platelet; Future -     TSH; Future  Vitamin D deficiency Assessment & Plan: Needs new vitamin D level today, orders placed.   Orders: -     Amb ref to Medical Nutrition Therapy-MNT -     VITAMIN D 25 Hydroxy (Vit-D Deficiency, Fractures); Future  Aortic valve sclerosis Assessment & Plan: Seen on ECHO in 2017, murmur appears stable from previous. Will continue to monitor   Osteoarthritis of right glenohumeral joint Assessment & Plan: Pt is requesting a new referral to orthopedics, states she has had injections in the past and feels like she needs another one. Referral placed.  Orders: -     Ambulatory referral to Orthopedic Surgery     Return in about 6 months (around 06/07/2023) for follow up.    Karie Georges, MD

## 2022-12-06 LAB — COMPREHENSIVE METABOLIC PANEL
AG Ratio: 1.5 (calc) (ref 1.0–2.5)
ALT: 17 U/L (ref 6–29)
AST: 27 U/L (ref 10–35)
Albumin: 4.2 g/dL (ref 3.6–5.1)
Alkaline phosphatase (APISO): 78 U/L (ref 37–153)
BUN/Creatinine Ratio: 21 (calc) (ref 6–22)
BUN: 22 mg/dL (ref 7–25)
CO2: 27 mmol/L (ref 20–32)
Calcium: 9.1 mg/dL (ref 8.6–10.4)
Chloride: 108 mmol/L (ref 98–110)
Creat: 1.07 mg/dL — ABNORMAL HIGH (ref 0.60–1.00)
Globulin: 2.8 g/dL (calc) (ref 1.9–3.7)
Glucose, Bld: 104 mg/dL — ABNORMAL HIGH (ref 65–99)
Potassium: 4.1 mmol/L (ref 3.5–5.3)
Sodium: 143 mmol/L (ref 135–146)
Total Bilirubin: 0.3 mg/dL (ref 0.2–1.2)
Total Protein: 7 g/dL (ref 6.1–8.1)

## 2022-12-06 LAB — CBC WITH DIFFERENTIAL/PLATELET
Basophils Absolute: 19 cells/uL (ref 0–200)
Basophils Relative: 0.3 %
Eosinophils Absolute: 88 cells/uL (ref 15–500)
Eosinophils Relative: 1.4 %
Hemoglobin: 12.6 g/dL (ref 11.7–15.5)
Lymphs Abs: 1789 cells/uL (ref 850–3900)
MCHC: 33.2 g/dL (ref 32.0–36.0)
MCV: 87.3 fL (ref 80.0–100.0)
Neutro Abs: 3982 cells/uL (ref 1500–7800)
Neutrophils Relative %: 63.2 %
RBC: 4.34 10*6/uL (ref 3.80–5.10)
Total Lymphocyte: 28.4 %
WBC: 6.3 10*3/uL (ref 3.8–10.8)

## 2022-12-06 LAB — LIPID PANEL
Cholesterol: 183 mg/dL (ref <200)
HDL: 59 mg/dL (ref >=50)
LDL Cholesterol (Calc): 99 mg/dL (calc)
Non-HDL Cholesterol (Calc): 124 mg/dL (calc) (ref <130)
Total CHOL/HDL Ratio: 3.1 (calc) (ref <5.0)
Triglycerides: 153 mg/dL — ABNORMAL HIGH (ref <150)

## 2022-12-06 LAB — VITAMIN D 25 HYDROXY (VIT D DEFICIENCY, FRACTURES): Vit D, 25-Hydroxy: 31 ng/mL (ref 30–100)

## 2022-12-06 LAB — TSH: TSH: 1.98 mIU/L (ref 0.40–4.50)

## 2022-12-08 NOTE — Assessment & Plan Note (Addendum)
Needs new TSH today, currently on 50 mcg daily, will continue this dose -- will adjust based on the TSH results.

## 2022-12-08 NOTE — Assessment & Plan Note (Signed)
Needs new lipid panel today, on rosuvastatin 10 mg daily, no side effects reported will continue this medication

## 2022-12-08 NOTE — Assessment & Plan Note (Signed)
Pt is requesting a new referral to orthopedics, states she has had injections in the past and feels like she needs another one. Referral placed.

## 2022-12-08 NOTE — Assessment & Plan Note (Signed)
Seen on ECHO in 2017, murmur appears stable from previous. Will continue to monitor

## 2022-12-08 NOTE — Assessment & Plan Note (Signed)
Needs new vitamin D level today, orders placed.

## 2022-12-15 ENCOUNTER — Telehealth: Payer: Self-pay | Admitting: Psychiatry

## 2022-12-15 NOTE — Telephone Encounter (Signed)
Patient called to request a RF and got an automated system that said to call provider. Patient has RF. Called pharmacy and they will process and ship order within 2 business days. Patient still has one RF on file after this one. She was notified.

## 2022-12-15 NOTE — Telephone Encounter (Signed)
Next visit is 03/12/23. Requesting refill on Equetro caps called in to:  [facility].

## 2023-02-03 DIAGNOSIS — L814 Other melanin hyperpigmentation: Secondary | ICD-10-CM | POA: Diagnosis not present

## 2023-02-03 DIAGNOSIS — L570 Actinic keratosis: Secondary | ICD-10-CM | POA: Diagnosis not present

## 2023-02-03 DIAGNOSIS — L578 Other skin changes due to chronic exposure to nonionizing radiation: Secondary | ICD-10-CM | POA: Diagnosis not present

## 2023-02-03 DIAGNOSIS — D0462 Carcinoma in situ of skin of left upper limb, including shoulder: Secondary | ICD-10-CM | POA: Diagnosis not present

## 2023-02-03 DIAGNOSIS — L821 Other seborrheic keratosis: Secondary | ICD-10-CM | POA: Diagnosis not present

## 2023-02-03 DIAGNOSIS — T148XXA Other injury of unspecified body region, initial encounter: Secondary | ICD-10-CM | POA: Diagnosis not present

## 2023-02-03 DIAGNOSIS — D692 Other nonthrombocytopenic purpura: Secondary | ICD-10-CM | POA: Diagnosis not present

## 2023-02-03 DIAGNOSIS — D0472 Carcinoma in situ of skin of left lower limb, including hip: Secondary | ICD-10-CM | POA: Diagnosis not present

## 2023-02-03 DIAGNOSIS — L0100 Impetigo, unspecified: Secondary | ICD-10-CM | POA: Diagnosis not present

## 2023-02-03 DIAGNOSIS — D485 Neoplasm of uncertain behavior of skin: Secondary | ICD-10-CM | POA: Diagnosis not present

## 2023-02-03 DIAGNOSIS — C44329 Squamous cell carcinoma of skin of other parts of face: Secondary | ICD-10-CM | POA: Diagnosis not present

## 2023-02-13 ENCOUNTER — Telehealth: Payer: Self-pay | Admitting: Psychiatry

## 2023-02-13 DIAGNOSIS — F319 Bipolar disorder, unspecified: Secondary | ICD-10-CM

## 2023-02-13 DIAGNOSIS — F5104 Psychophysiologic insomnia: Secondary | ICD-10-CM

## 2023-02-13 NOTE — Telephone Encounter (Signed)
Patient called in for refill on Quetiapine 300mg . Ph: [phone] Appt 10/17 Pharmacy [facility] [place]

## 2023-02-15 MED ORDER — QUETIAPINE FUMARATE 300 MG PO TABS
300.0000 mg | ORAL_TABLET | Freq: Every day | ORAL | 1 refills | Status: DC
Start: 2023-02-15 — End: 2023-07-06

## 2023-02-15 NOTE — Telephone Encounter (Signed)
Sent!

## 2023-02-16 ENCOUNTER — Other Ambulatory Visit: Payer: Self-pay

## 2023-02-24 ENCOUNTER — Other Ambulatory Visit: Payer: Self-pay | Admitting: Family Medicine

## 2023-02-27 NOTE — Telephone Encounter (Signed)
Pt called to say she only has one pill left of these prescriptions. Please fill at your earliest convenience.   [facility] [address] [place] - [address] Phone: [phone]  Fax: [phone]

## 2023-03-05 DIAGNOSIS — T50905A Adverse effect of unspecified drugs, medicaments and biological substances, initial encounter: Secondary | ICD-10-CM | POA: Diagnosis not present

## 2023-03-05 DIAGNOSIS — G3184 Mild cognitive impairment, so stated: Secondary | ICD-10-CM | POA: Diagnosis not present

## 2023-03-05 DIAGNOSIS — D049 Carcinoma in situ of skin, unspecified: Secondary | ICD-10-CM | POA: Diagnosis not present

## 2023-03-12 ENCOUNTER — Ambulatory Visit: Payer: Medicare Other | Admitting: Psychiatry

## 2023-03-27 ENCOUNTER — Telehealth: Payer: Self-pay | Admitting: Psychiatry

## 2023-03-27 DIAGNOSIS — F319 Bipolar disorder, unspecified: Secondary | ICD-10-CM

## 2023-03-27 MED ORDER — EQUETRO 200 MG PO CP12: 400.0000 mg | ORAL_CAPSULE | Freq: Every day | ORAL | 1 refills | Status: DC

## 2023-03-27 NOTE — Telephone Encounter (Signed)
Sent!

## 2023-03-27 NOTE — Telephone Encounter (Signed)
PT called asking for a refill on her equetro 200 mg. Pharmacy is [facility]

## 2023-04-28 ENCOUNTER — Telehealth: Payer: Self-pay | Admitting: Psychiatry

## 2023-04-28 NOTE — Telephone Encounter (Signed)
Rebecca Novak called on behalf of her sister [name], she would like her to get a memory evaluation at her next follow up with Dr. Jennelle Human on Dec 11. I explained that Dr. Jennelle Human can do some short memory tests but may not be the appropriate person to fully evaluate this. Please note for Dec 11th appt.

## 2023-05-05 ENCOUNTER — Telehealth: Payer: Self-pay | Admitting: Psychiatry

## 2023-05-05 NOTE — Telephone Encounter (Signed)
Next visit is 05/05/23. Rebecca Novak is requesting a refill on her Quetiapine 300 mg called to:   [facility] - [place], [place] - [facility] AT [facility]   Phone: [phone]  Fax: [phone]

## 2023-05-05 NOTE — Telephone Encounter (Signed)
Patient has a RF available at [facility]. She has an appt with Dr. Jennelle Human tomorrow. LVM with info.

## 2023-05-06 ENCOUNTER — Ambulatory Visit: Payer: Medicare Other | Admitting: Family Medicine

## 2023-05-06 ENCOUNTER — Ambulatory Visit: Payer: Medicare Other | Admitting: Psychiatry

## 2023-05-06 ENCOUNTER — Encounter: Payer: Self-pay | Admitting: Psychiatry

## 2023-05-06 DIAGNOSIS — R7989 Other specified abnormal findings of blood chemistry: Secondary | ICD-10-CM | POA: Diagnosis not present

## 2023-05-06 DIAGNOSIS — F411 Generalized anxiety disorder: Secondary | ICD-10-CM | POA: Diagnosis not present

## 2023-05-06 DIAGNOSIS — F5104 Psychophysiologic insomnia: Secondary | ICD-10-CM

## 2023-05-06 DIAGNOSIS — F319 Bipolar disorder, unspecified: Secondary | ICD-10-CM
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
## 2023-05-11 ENCOUNTER — Telehealth: Payer: Self-pay

## 2023-05-11 NOTE — Telephone Encounter (Signed)
Unsuccessful attempt to reach patient on preferred number listed in notes for scheduled AWV. Left message on voicemail okay to reschedule. 

## 2023-05-29 ENCOUNTER — Other Ambulatory Visit: Payer: Self-pay | Admitting: Family Medicine

## 2023-05-29 NOTE — Telephone Encounter (Signed)
 Copied from CRM [id number]. Topic: Clinical - Medication Refill >> May 29, 2023 10:58 AM Eleanor BROCKS wrote: Most Recent Primary Care Visit:  Provider: OZELL HERON HERO  Department: [facility]  Visit Type: PHYSICAL  Date: 12/05/2022  Medication: levothyroxine  (SYNTHROID ) 50 MCG tablet rosuvastatin  (CRESTOR ) 10 MG tablet  Has the patient contacted their pharmacy? Yes (Agent: If no, request that the patient contact the pharmacy for the refill. If patient does not wish to contact the pharmacy document the reason why and proceed with request.) (Agent: If yes, when and what did the pharmacy advise?)  Is this the correct pharmacy for this prescription? Yes If no, delete pharmacy and type the correct one.  This is the patient's preferred pharmacy:  [facility] [address], [place] - [address] [place] [address] Phone: [phone] Fax: [phone]   Has the prescription been filled recently? No  Is the patient out of the medication? Yes  Has the patient been seen for an appointment in the last year OR does the patient have an upcoming appointment? Yes  Can we respond through MyChart? No  Agent: Please be advised that Rx refills may take up to 3 business days. We ask that you follow-up with your pharmacy.

## 2023-06-08 DIAGNOSIS — M25511 Pain in right shoulder: Secondary | ICD-10-CM | POA: Diagnosis not present

## 2023-07-06 ENCOUNTER — Other Ambulatory Visit: Payer: Self-pay | Admitting: Psychiatry

## 2023-07-06 DIAGNOSIS — F319 Bipolar disorder, unspecified: Secondary | ICD-10-CM

## 2023-07-06 DIAGNOSIS — F5104 Psychophysiologic insomnia: Secondary | ICD-10-CM

## 2023-08-30 ENCOUNTER — Other Ambulatory Visit: Payer: Self-pay | Admitting: Family Medicine

## 2023-09-04 DIAGNOSIS — M19011 Primary osteoarthritis, right shoulder: Secondary | ICD-10-CM | POA: Diagnosis not present

## 2023-09-04 DIAGNOSIS — M25511 Pain in right shoulder: Secondary | ICD-10-CM | POA: Diagnosis not present

## 2023-09-08 ENCOUNTER — Ambulatory Visit: Payer: Medicare Other | Admitting: Family Medicine

## 2023-09-08 DIAGNOSIS — Z0000 Encounter for general adult medical examination without abnormal findings: Secondary | ICD-10-CM | POA: Diagnosis not present

## 2023-09-08 NOTE — Patient Instructions (Addendum)
 I really enjoyed getting to talk with you today! I am available on Tuesdays and Thursdays for virtual visits if you have any questions or concerns, or if I can be of any further assistance.   CHECKLIST FROM ANNUAL WELLNESS VISIT:  -Follow up (please call to schedule if not scheduled after visit):   -yearly for annual wellness visit with primary care office  Here is a list of your preventive care/health maintenance measures and the plan for each if any are due:  PLAN For any measures below that may be due:   Health Maintenance  Topic Date Due   COVID-19 Vaccine (3 - Pfizer risk series) 09/07/2019   DTaP/Tdap/Td (3 - Td or Tdap) 01/03/2022   INFLUENZA VACCINE  12/25/2023   Medicare Annual Wellness (AWV)  09/07/2024   Colonoscopy  10/23/2024   Pneumonia Vaccine 61+ Years old  Completed   DEXA SCAN  Completed   Hepatitis C Screening  Completed   Zoster Vaccines- Shingrix  Completed   HPV VACCINES  Aged Out   Meningococcal B Vaccine  Aged Out    -See a dentist at least yearly  -Get your eyes checked and then per your eye specialist's recommendations  -Other issues addressed today:   -I have included below further information regarding a healthy whole foods based diet, physical activity guidelines for adults, stress management and opportunities for social connections. I hope you find this information useful.   -----------------------------------------------------------------------------------------------------------------------------------------------------------------------------------------------------------------------------------------------------------    NUTRITION: -eat real food: lots of colorful vegetables (half the plate) and fruits -5-7 servings of vegetables and fruits per day (fresh or steamed is best), exp. 2 servings of vegetables with lunch and dinner and 2 servings of fruit per day. Berries and greens such as kale and collards are great choices.  -consume on a  regular basis:  fresh fruits, fresh veggies, fish, nuts, seeds, healthy oils (such as olive oil, avocado oil), whole grains (make sure for bread/pasta/crackers/etc., that the first ingredient on label contains the word "whole"), legumes. -can eat small amounts of dairy and lean meat (no larger than the palm of your hand), but avoid processed meats such as ham, bacon, lunch meat, etc. -drink water -try to avoid fast food and pre-packaged foods, processed meat, ultra processed foods/beverages (donuts, candy, etc.) -most experts advise limiting sodium to < 2300mg  per day, should limit further is any chronic conditions such as high blood pressure, heart disease, diabetes, etc. The American Heart Association advised that < 1500mg  is is ideal -try to avoid foods/beverages that contain any ingredients with names you do not recognize  -try to avoid foods/beverages  with added sugar or sweeteners/sweets  -try to avoid sweet drinks (including diet drinks): soda, juice, Gatorade, sweet tea, power drinks, diet drinks -try to avoid white rice, white bread, pasta (unless whole grain)  EXERCISE GUIDELINES FOR ADULTS: -if you wish to increase your physical activity, do so gradually and with the approval of your doctor -STOP and seek medical care immediately if you have any chest pain, chest discomfort or trouble breathing when starting or increasing exercise  -move and stretch your body, legs, feet and arms when sitting for long periods -Physical activity guidelines for optimal health in adults: -get at least 150 minutes per week of moderate exercise (can talk, but not sing); this is about 20-30 minutes of sustained activity 5-7 days per week or two 10-15 minute episodes of sustained activity 5-7 days per week -do some muscle building/resistance training/strength training at least 2 days per week  -  balance exercises 3+ days per week:   Stand somewhere where you have something sturdy to hold onto if you lose  balance    1) lift up on toes, then back down, start with 5x per day and work up to 20x   2) stand and lift one leg straight out to the side so that foot is a few inches of the floor, start with 5x each side and work up to 20x each side   3) stand on one foot, start with 5 seconds each side and work up to 20 seconds on each side  If you need ideas or help with getting more active:  -Silver sneakers https://tools.silversneakers.com  -Walk with a Doc: http://www.duncan-williams.[email or web address]/  -try to include resistance (weight lifting/strength building) and balance exercises twice per week: or the following link for ideas: http://castillo-powell.com/  BuyDucts.dk  STRESS MANAGEMENT: -can try meditating, or just sitting quietly with deep breathing while intentionally relaxing all parts of your body for 5 minutes daily -if you need further help with stress, anxiety or depression please follow up with your primary doctor or contact the wonderful folks at [facility]: [phone]  SOCIAL CONNECTIONS: -options in [place] if you wish to engage in more social and exercise related activities:  -Silver sneakers https://tools.silversneakers.com  -Walk with a Doc: http://www.duncan-williams.[email or web address]/  -Check out the [facility] Active Adults 50+ section on the [place] [employer] (hiking clubs, book clubs, cards and games, chess, exercise classes, aquatic classes and much more) - see the website for details: https://www.[place].gov/departments/parks-recreation/active-adults50  -YouTube has lots of exercise videos for different ages and abilities as well  -[facility] (a variety of indoor and outdoor inperson activities for adults). [phone]. [address].  -Virtual Online Classes (a variety of topics): see seniorplanet.org or call [phone]  -consider volunteering at a  school, hospice center, church, senior center or elsewhere

## 2023-09-08 NOTE — Progress Notes (Signed)
 PATIENT CHECK-IN and HEALTH RISK ASSESSMENT QUESTIONNAIRE:  -completed by phone/video for upcoming Medicare Preventive Visit  Pre-Visit Check-in: 1)Vitals (height, wt, BP, etc) - record in vitals section for visit on day of visit Request home vitals (wt, BP, etc.) and enter into vitals, THEN update Vital Signs SmartPhrase below at the top of the HPI. See below.  2)Review and Update Medications, Allergies PMH, Surgeries, Social history in Epic 3)Hospitalizations in the last year with date/reason? n  4)Review and Update Care Team (patient's specialists) in Epic 5) Complete PHQ9 in Epic  6) Complete Fall Screening in Epic 7)Review all Health Maintenance Due and order under PCP if not done.  Medicare Wellness Patient Questionnaire:  Answer theses question about your habits: How often do you have a drink containing alcohol?n How many drinks containing alcohol do you have on a typical day when you are drinking?na How often do you have six or more drinks on one occasion?na Have you ever smoked?n  How many packs a day do/did you smoke? na Do you use smokeless tobacco?n Do you use an illicit drugs?n On average, how many days per week do you engage in moderate to strenuous exercise (like a brisk walk)?goes to the gym 7 days a week for 1 hour, cycling and swimming Typical diet: chicken, salads, peanut butter sandwiches, tries to eat healthy, drinks water  Answer theses question about your everyday activities: Can you perform most household chores?y Are you deaf or have significant trouble hearing?n Do you feel that you have a problem with memory?n Do you feel safe at home?y Last dentist visit?several times per year 8. Do you have any difficulty performing your everyday activities?n Are you having any difficulty walking, taking medications on your own, and or difficulty managing daily home needs?n Do you have difficulty walking or climbing stairs?n Do you have difficulty dressing or  bathing?n Do you have difficulty doing errands alone such as visiting a doctor's office or shopping?n Do you currently have any difficulty preparing food and eating?n Do you currently have any difficulty using the toilet?n Do you have any difficulty managing your finances?n Do you have any difficulties with housekeeping of managing your housekeeping?n   Do you have Advanced Directives in place (Living Will, Healthcare Power or Attorney)? y   Last eye Exam and location?goes on a regular basis   Do you currently use prescribed or non-prescribed narcotic or opioid pain medications?n  Do you have a history or close family history of breast, ovarian, tubal or peritoneal cancer or a family member with BRCA (breast cancer susceptibility 1 and 2) gene mutations? N  Social connections: good     ----------------------------------------------------------------------------------------------------------------------------------------------------------------------------------------------------------------------  Because this visit was a virtual/telehealth visit, some criteria may be missing or patient reported. Any vitals not documented were not able to be obtained and vitals that have been documented are patient reported.    MEDICARE ANNUAL PREVENTIVE VISIT WITH PROVIDER: (Welcome to Medicare, initial annual wellness or annual wellness exam)  Virtual Visit via Phone Note  I connected with Rebecca Novak on 09/08/23 by phone and verified that I am speaking with the correct person using two identifiers. Declined video visit.  Location patient: home Location provider:work or home office Persons participating in the virtual visit: patient, provider  Concerns and/or follow up today: stable, denies any concerns   See HM section in Epic for other details of completed HM.    ROS: negative for report of fevers, unintentional weight loss, vision changes, vision loss, hearing loss  or change,  chest pain, sob, hemoptysis, melena, hematochezia, hematuria, falls, bleeding or bruising, thoughts of suicide or self harm, memory loss  Patient-completed extensive health risk assessment - reviewed and discussed with the patient: See Health Risk Assessment completed with patient prior to the visit either above or in recent phone note. This was reviewed in detailed with the patient today and appropriate recommendations, orders and referrals were placed as needed per Summary below and patient instructions.   Review of Medical History: -PMH, PSH, Family History and current specialty and care providers reviewed and updated and listed below   Patient Care Team: Karie Georges, MD as PCP - General (Family Medicine) Tonny Bollman, MD as PCP - Cardiology (Cardiology) Cottle, Steva Ready., MD as Attending Physician (Psychiatry) Verner Chol, [facility] (Inactive) as Pharmacist (Pharmacist) Janalyn Harder, MD (Inactive) as Consulting Physician (Dermatology)   Past Medical History:  Diagnosis Date   ANEMIA, MILD 05/17/2008   BCC (basal cell carcinoma) 08/08/2009   right cheek (CX35FU +exc. 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   upper back (CX35FU)   SCC (squamous cell carcinoma) 11/18/2007   right lower leg (CX35FU)   SCC (squamous cell carcinoma) 03/12/2011   left outer calf   SCC (squamous cell carcinoma) 11/19/2011   left side of chest -tx p bx   SCC (squamous cell  carcinoma) 12/08/2012   left chest (CX35FU)   SCC (squamous cell carcinoma) 09/28/2014   left arm inferior (CX35FU), right arm, lateral (CX35FU)   SCC (squamous cell carcinoma) 04/25/2015   rigth forearm    SCC (squamous cell carcinoma) 01/10/2017   right shin proximal (CX35FU)   SCC (squamous cell carcinoma) 04/21/2018   top right hand-tx p bx   SYSTOLIC MURMUR 05/17/2007    Past Surgical History:  Procedure Laterality Date   APPENDECTOMY     TONSILLECTOMY     VAGINAL HYSTERECTOMY     uncertain reason.     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  Tobacco Use   Smoking status: Former    Current packs/day: 0.33    Average packs/day: 0.3 packs/day for 44.0 years (14.5 ttl pk-yrs)    Types: Cigarettes   Smokeless tobacco: Never   Tobacco comments:    02/15/2013 "stopped smoking cigarettes ~ 7 yr ago"  Vaping Use   Vaping status: Never Used  Substance and Sexual Activity   Alcohol use: No   Drug use: No   Sexual activity: Yes  Other Topics Concern   Not on file  Social History Narrative   Not on file   Social Drivers of Health   Financial Resource Strain: Low Risk  (05/06/2022)  Overall Financial Resource Strain (CARDIA)    Difficulty of Paying Living Expenses: Not hard at all  Food Insecurity: No Food Insecurity (05/06/2022)   Hunger Vital Sign    Worried About Running Out of Food in the Last Year: Never true    Ran Out of Food in the Last Year: Never true  Transportation Needs: No Transportation Needs (05/06/2022)   PRAPARE - Administrator, Civil Service (Medical): No    Lack of Transportation (Non-Medical): No  Physical Activity: Sufficiently Active (05/06/2022)   Exercise Vital Sign    Days of Exercise per  Week: 7 days    Minutes of Exercise per Session: 60 min  Stress: No Stress Concern Present (05/06/2022)   [employer] of Occupational Health - Occupational Stress Questionnaire    Feeling of Stress : Not at all  Social Connections: Moderately Integrated (05/06/2022)   Social Connection and Isolation Panel [NHANES]    Frequency of Communication with Friends and Family: More than three times a week    Frequency of Social Gatherings with Friends and Family: More than three times a week    Attends Religious Services: More than 4 times per year    Active Member of [employer] or Organizations: Yes    Attends Banker Meetings: More than 4 times per year    Marital Status: Widowed  Intimate Partner Violence: Not At Risk (05/06/2022)   Humiliation, Afraid, Rape, and Kick questionnaire    Fear of Current or Ex-Partner: No    Emotionally Abused: No    Physically Abused: No    Sexually Abused: No    Family History  Problem Relation Age of Onset   Dementia Mother    Healthy Father        until death at age 67   Mental illness Brother        not been in contact in years   Healthy Sister     Current Outpatient Medications on File Prior to Visit  Medication Sig Dispense Refill   cholecalciferol (VITAMIN D3) 25 MCG (1000 UNIT) tablet Take 1,000 Units by mouth daily.     clobetasol cream (TEMOVATE) 0.05 % Apply 1 Application topically 2 (two) times daily. 30 g 0   EQUETRO 200 MG CP12 12 hr capsule Take 2 capsules (400 mg total) by mouth daily. 180 capsule 1   levothyroxine (SYNTHROID) 50 MCG tablet TAKE 1 TABLET BY MOUTH ONCE DAILY . APPOINTMENT REQUIRED FOR FUTURE REFILLS 30 tablet 0   QUEtiapine (SEROQUEL) 300 MG tablet TAKE 1 TABLET AT BEDTIME 90 tablet 1   rosuvastatin (CRESTOR) 10 MG tablet TAKE 1 TABLET BY MOUTH ONCE DAILY . APPOINTMENT REQUIRED FOR FUTURE REFILLS 30 tablet 0   SF 5000 PLUS 1.1 % CREA dental cream USE ONCE AT BEDTIME IN PLACE OF REGULAR TOOTHPASTE. BRUSH FOR  2 MINUTES EXPECTORATE. DO NOT RINSE     [DISCONTINUED] Calcium Carbonate (CALCIUM 500 PO) Take 2 tablets by mouth daily.       No current facility-administered medications on file prior to visit.    No Known Allergies     Physical Exam Vitals requested from patient and listed below if patient had equipment and was able to obtain at home for this virtual visit: There were no vitals filed for this visit. Estimated body mass index is 28.54 kg/m as calculated from the following:   Height as of 12/05/22: 5' 7.75" (1.721 m).   Weight as of 12/05/22: 186 lb 4.8 oz (84.5  kg).  EKG (optional): deferred due to virtual visit  GENERAL: alert, oriented, no acute distress detected, full vision exam deferred due to pandemic and/or virtual encounter  PSYCH/NEURO: pleasant and cooperative, no obvious depression or anxiety, speech and thought processing grossly intact, Cognitive function grossly intact  Flowsheet Row Office Visit from 12/05/2022 in [facility]  PHQ-9 Total Score 0           09/08/2023    5:31 PM 12/05/2022    2:27 PM 12/05/2022    2:24 PM 12/17/2021   11:05 AM 05/03/2021    1:00 PM  Depression screen PHQ 2/9  Decreased Interest 0  0 0 0  Down, Depressed, Hopeless 0  0 0 0  PHQ - 2 Score 0  0 0 0  Altered sleeping   0 0   Tired, decreased energy   0 0   Change in appetite   0 0   Feeling bad or failure about yourself    0 0   Trouble concentrating   0 0   Moving slowly or fidgety/restless   0 0   Suicidal thoughts  0  0   PHQ-9 Score   0 0   Difficult doing work/chores    Not difficult at all        05/03/2021    1:02 PM 12/17/2021   11:05 AM 05/06/2022    1:18 PM 12/05/2022    2:20 PM 09/08/2023    5:31 PM  Fall Risk  Falls in the past year? 0 0 0 1 0  Was there an injury with Fall? 0 0 0 0 0  Fall Risk Category Calculator 0 0 0 1 0  Fall Risk Category (Retired) Low Low Low    (RETIRED) Patient Fall Risk Level Low fall risk Low fall  risk Low fall risk    Patient at Risk for Falls Due to  No Fall Risks No Fall Risks Impaired balance/gait   Fall risk Follow up  Falls evaluation completed Falls prevention discussed Falls evaluation completed      SUMMARY AND PLAN:  Encounter for Medicare annual wellness exam   Discussed applicable health maintenance/preventive health measures and advised and referred or ordered per patient preferences: -discussed vaccines due recs/risks, she plans to get at the pharmacy -discussed bone density test, she declined for now, agrees to discuss with Dr. Ardia Becket if decides to do, vit d and adequate calcium Health Maintenance  Topic Date Due   COVID-19 Vaccine (3 - Pfizer risk series) 09/07/2019   DTaP/Tdap/Td (3 - Td or Tdap) 01/03/2022   INFLUENZA VACCINE  12/25/2023   Medicare Annual Wellness (AWV)  09/07/2024   Colonoscopy  10/23/2024   Pneumonia Vaccine 54+ Years old  Completed   DEXA SCAN  Completed   Hepatitis C Screening  Completed   Zoster Vaccines- Shingrix  Completed   HPV VACCINES  Aged Out   Meningococcal B Vaccine  Aged Out    Education and counseling on the following was provided based on the above review of health and a plan/checklist for the patient, along with additional information discussed, was provided for the patient in the patient instructions :  -Advised and counseled on a healthy lifestyle - including the importance of a healthy diet, regular physical activity -Reviewed patient's current diet. Advised and counseled on a whole foods based healthy diet. A summary of a healthy diet was provided in the Patient Instructions.  -reviewed patient's current physical activity level and  discussed exercise guidelines for adults. Discussed community resources and ideas for safe exercise at home to assist in meeting exercise guideline recommendations in a safe and healthy way.  -Advise yearly dental visits at minimum and regular eye exams  Follow up: see patient instructions      Patient Instructions  I really enjoyed getting to talk with you today! I am available on Tuesdays and Thursdays for virtual visits if you have any questions or concerns, or if I can be of any further assistance.   CHECKLIST FROM ANNUAL WELLNESS VISIT:  -Follow up (please call to schedule if not scheduled after visit):   -yearly for annual wellness visit with primary care office  Here is a list of your preventive care/health maintenance measures and the plan for each if any are due:  PLAN For any measures below that may be due:   Health Maintenance  Topic Date Due   COVID-19 Vaccine (3 - Pfizer risk series) 09/07/2019   DTaP/Tdap/Td (3 - Td or Tdap) 01/03/2022   INFLUENZA VACCINE  12/25/2023   Medicare Annual Wellness (AWV)  09/07/2024   Colonoscopy  10/23/2024   Pneumonia Vaccine 6+ Years old  Completed   DEXA SCAN  Completed   Hepatitis C Screening  Completed   Zoster Vaccines- Shingrix  Completed   HPV VACCINES  Aged Out   Meningococcal B Vaccine  Aged Out    -See a dentist at least yearly  -Get your eyes checked and then per your eye specialist's recommendations  -Other issues addressed today:   -I have included below further information regarding a healthy whole foods based diet, physical activity guidelines for adults, stress management and opportunities for social connections. I hope you find this information useful.   -----------------------------------------------------------------------------------------------------------------------------------------------------------------------------------------------------------------------------------------------------------    NUTRITION: -eat real food: lots of colorful vegetables (half the plate) and fruits -5-7 servings of vegetables and fruits per day (fresh or steamed is best), exp. 2 servings of vegetables with lunch and dinner and 2 servings of fruit per day. Berries and greens such as kale and collards are  great choices.  -consume on a regular basis:  fresh fruits, fresh veggies, fish, nuts, seeds, healthy oils (such as olive oil, avocado oil), whole grains (make sure for bread/pasta/crackers/etc., that the first ingredient on label contains the word "whole"), legumes. -can eat small amounts of dairy and lean meat (no larger than the palm of your hand), but avoid processed meats such as ham, bacon, lunch meat, etc. -drink water -try to avoid fast food and pre-packaged foods, processed meat, ultra processed foods/beverages (donuts, candy, etc.) -most experts advise limiting sodium to < 2300mg  per day, should limit further is any chronic conditions such as high blood pressure, heart disease, diabetes, etc. The American Heart Association advised that < 1500mg  is is ideal -try to avoid foods/beverages that contain any ingredients with names you do not recognize  -try to avoid foods/beverages  with added sugar or sweeteners/sweets  -try to avoid sweet drinks (including diet drinks): soda, juice, Gatorade, sweet tea, power drinks, diet drinks -try to avoid white rice, white bread, pasta (unless whole grain)  EXERCISE GUIDELINES FOR ADULTS: -if you wish to increase your physical activity, do so gradually and with the approval of your doctor -STOP and seek medical care immediately if you have any chest pain, chest discomfort or trouble breathing when starting or increasing exercise  -move and stretch your body, legs, feet and arms when sitting for long periods -Physical activity guidelines for optimal health  in adults: -get at least 150 minutes per week of moderate exercise (can talk, but not sing); this is about 20-30 minutes of sustained activity 5-7 days per week or two 10-15 minute episodes of sustained activity 5-7 days per week -do some muscle building/resistance training/strength training at least 2 days per week  -balance exercises 3+ days per week:   Stand somewhere where you have something sturdy  to hold onto if you lose balance    1) lift up on toes, then back down, start with 5x per day and work up to 20x   2) stand and lift one leg straight out to the side so that foot is a few inches of the floor, start with 5x each side and work up to 20x each side   3) stand on one foot, start with 5 seconds each side and work up to 20 seconds on each side  If you need ideas or help with getting more active:  -Silver sneakers https://tools.silversneakers.com  -Walk with a Doc: http://www.duncan-williams.com/  -try to include resistance (weight lifting/strength building) and balance exercises twice per week: or the following link for ideas: http://castillo-powell.com/  BuyDucts.dk  STRESS MANAGEMENT: -can try meditating, or just sitting quietly with deep breathing while intentionally relaxing all parts of your body for 5 minutes daily -if you need further help with stress, anxiety or depression please follow up with your primary doctor or contact the wonderful folks at [facility]: [phone]  SOCIAL CONNECTIONS: -options in [place] if you wish to engage in more social and exercise related activities:  -Silver sneakers https://tools.silversneakers.com  -Walk with a Doc: http://www.duncan-williams.com/  -Check out the [facility] Active Adults 50+ section on the [place] [employer] (hiking clubs, book clubs, cards and games, chess, exercise classes, aquatic classes and much more) - see the website for details: https://www.[place].gov/departments/parks-recreation/active-adults50  -YouTube has lots of exercise videos for different ages and abilities as well  -[facility] (a variety of indoor and outdoor inperson activities for adults). [phone]. [address]  -Virtual Online Classes (a variety of topics): see seniorplanet.org or call  [phone]  -consider volunteering at a school, hospice center, church, senior center or elsewhere            Maurie Southern, DO

## 2023-09-18 ENCOUNTER — Ambulatory Visit (INDEPENDENT_AMBULATORY_CARE_PROVIDER_SITE_OTHER): Admitting: Family Medicine

## 2023-09-18 ENCOUNTER — Telehealth: Payer: Self-pay | Admitting: Family Medicine

## 2023-09-18 VITALS — BP 126/70 | HR 62 | Temp 97.6°F | Resp 16 | Ht 68.0 in | Wt 184.0 lb

## 2023-09-18 DIAGNOSIS — R7301 Impaired fasting glucose: Secondary | ICD-10-CM

## 2023-09-18 DIAGNOSIS — E785 Hyperlipidemia, unspecified: Secondary | ICD-10-CM

## 2023-09-18 DIAGNOSIS — Z780 Asymptomatic menopausal state: Secondary | ICD-10-CM | POA: Diagnosis not present

## 2023-09-18 DIAGNOSIS — E039 Hypothyroidism, unspecified: Secondary | ICD-10-CM | POA: Diagnosis not present

## 2023-09-18 MED ORDER — LEVOTHYROXINE SODIUM 50 MCG PO TABS
50.0000 ug | ORAL_TABLET | Freq: Every day | ORAL | 1 refills | Status: DC
Start: 2023-09-18 — End: 2023-09-22

## 2023-09-18 MED ORDER — ROSUVASTATIN CALCIUM 10 MG PO TABS
10.0000 mg | ORAL_TABLET | Freq: Every day | ORAL | 1 refills | Status: DC
Start: 2023-09-18 — End: 2024-04-01

## 2023-09-18 NOTE — Telephone Encounter (Signed)
**Note De-identified  [place] Obfuscation** Please advise 

## 2023-09-18 NOTE — Telephone Encounter (Signed)
 Sister requesting TOC from Bambi Lever to Nafziger. Says Nils Baseman has a healthy history for the patient and it may work better. Please reach out to sister at [phone]

## 2023-09-18 NOTE — Progress Notes (Signed)
 Established Patient Office Visit  Subjective   Patient ID: Rebecca Novak, female    DOB: 06/10/1945  Age: 78 y.o. MRN: [id number]  Chief Complaint  Patient presents with   Medication Refill    Pt is here for follow up today on hypothryoid and HLD. She is with her sister today. She reports she is feeling well, in her normal state of health.   Hypothyroidism - pt reports compliance with her medication, she denies any constipation, weight gain, hair loss or depression symptoms. Needs refills and new TSH for monitoring today.   HLD --pt reports compliance with rosuvastatin  10 mg daily. Is tolerating the medication without muscle cramps, weakness, or dizziness. Needs new lipid panel and CMP today for surveillance. Denies any cardiac symptoms.    Current Outpatient Medications  Medication Instructions   cholecalciferol (VITAMIN D3) 1,000 Units, Daily   clobetasol  cream (TEMOVATE ) 0.05 % 1 Application, Topical, 2 times daily   Equetro  400 mg, Oral, Daily   levothyroxine  (SYNTHROID ) 50 mcg, Oral, Daily before breakfast   QUEtiapine  (SEROQUEL ) 300 mg, Oral, Daily at bedtime   rosuvastatin  (CRESTOR ) 10 mg, Oral, Daily   SF 5000 PLUS 1.1 % CREA dental cream USE ONCE AT BEDTIME IN PLACE OF REGULAR TOOTHPASTE. BRUSH FOR 2 MINUTES EXPECTORATE. 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     Objective:     BP 126/70 (BP Location: Left Arm, Patient Position: Sitting, Cuff Size: Normal)   Pulse 62   Temp 97.6 F (36.4 C) (Oral)   Resp 16   Ht 5\' 8"  (1.727 m)   Wt 184 lb (83.5 kg)   SpO2 98%   BMI 27.98 kg/m    Physical Exam Vitals reviewed.  Constitutional:      Appearance: Normal appearance. She is well-groomed and normal weight.  Neck:     Thyroid : No thyromegaly.  Cardiovascular:     Rate and Rhythm: Normal rate and regular rhythm.     Pulses: Normal pulses.     Heart sounds: S1 normal and S2 normal. Murmur heard.  Pulmonary:     Effort: Pulmonary effort is normal.     Breath sounds: Normal breath sounds and air entry.  Musculoskeletal:     Right lower leg: No edema.     Left lower leg: No edema.  Neurological:     Mental Status: She is alert and oriented to person, place, and time. Mental status is at baseline.     Gait: Gait is intact.  Psychiatric:        Mood and Affect: Mood and affect normal.        Speech:  Speech normal.        Behavior: Behavior normal.      No results found for any visits on 09/18/23.    The 10-year ASCVD risk score (Arnett DK, et al., 2019) is: 23.8%    Assessment & Plan:  Acquired hypothyroidism Assessment & Plan: Needs new TSH today, currently on 50 mcg daily, will continue this dose -- will adjust based on the TSH results.   Orders: -     Levothyroxine  Sodium; Take 1 tablet (50 mcg total) by mouth daily before breakfast.  Dispense: 90 tablet; Refill: 1 -     TSH; Future  Dyslipidemia Assessment & Plan: Needs new lipid panel today, on rosuvastatin  10 mg daily, no side effects reported will continue this medication, needs new lipid panel and CMP for surveillance  today.  Orders: -     Rosuvastatin  Calcium ; Take 1 tablet (10 mg total) by mouth daily.  Dispense: 90 tablet; Refill: 1 -     Lipid panel; Future -     Comprehensive metabolic panel with GFR; Future  Elevated fasting glucose Seen on previous labs that were reviewed this visit, will check new A1C for follow up today  -     Hemoglobin A1c; Future  Postmenopausal state Reviewed health maintenance with patient today, she is due for repeat DEXA scan, pt agreeable, orders placed.   -     DG Bone Density; Future     Return in about 6 months (around 03/19/2024).    Aida House, MD

## 2023-09-19 NOTE — Telephone Encounter (Signed)
 Ok to switch

## 2023-09-20 NOTE — Assessment & Plan Note (Signed)
 Needs new lipid panel today, on rosuvastatin  10 mg daily, no side effects reported will continue this medication, needs new lipid panel and CMP for surveillance today.

## 2023-09-20 NOTE — Assessment & Plan Note (Signed)
Needs new TSH today, currently on 50 mcg daily, will continue this dose -- will adjust based on the TSH results.

## 2023-09-21 ENCOUNTER — Other Ambulatory Visit

## 2023-09-21 ENCOUNTER — Other Ambulatory Visit (INDEPENDENT_AMBULATORY_CARE_PROVIDER_SITE_OTHER)

## 2023-09-21 DIAGNOSIS — E039 Hypothyroidism, unspecified: Secondary | ICD-10-CM | POA: Diagnosis not present

## 2023-09-21 DIAGNOSIS — R7301 Impaired fasting glucose: Secondary | ICD-10-CM

## 2023-09-21 DIAGNOSIS — E785 Hyperlipidemia, unspecified: Secondary | ICD-10-CM

## 2023-09-21 LAB — COMPREHENSIVE METABOLIC PANEL WITH GFR
ALT: 15 U/L (ref 0–35)
AST: 23 U/L (ref 0–37)
Albumin: 3.8 g/dL (ref 3.5–5.2)
Alkaline Phosphatase: 68 U/L (ref 39–117)
BUN: 20 mg/dL (ref 6–23)
CO2: 29 meq/L (ref 19–32)
Calcium: 9.1 mg/dL (ref 8.4–10.5)
Chloride: 107 meq/L (ref 96–112)
Creatinine, Ser: 1.01 mg/dL (ref 0.40–1.20)
GFR: 52.82 mL/min — ABNORMAL LOW (ref >60.00)
Glucose, Bld: 80 mg/dL (ref 70–99)
Potassium: 4 meq/L (ref 3.5–5.1)
Sodium: 143 meq/L (ref 135–145)
Total Bilirubin: 0.4 mg/dL (ref 0.2–1.2)
Total Protein: 6.5 g/dL (ref 6.0–8.3)

## 2023-09-21 LAB — LIPID PANEL
Cholesterol: 186 mg/dL (ref 0–200)
HDL: 66.5 mg/dL (ref >39.00)
LDL Cholesterol: 98 mg/dL (ref 0–99)
NonHDL: 119.72
Total CHOL/HDL Ratio: 3
Triglycerides: 110 mg/dL (ref 0.0–149.0)
VLDL: 22 mg/dL (ref 0.0–40.0)

## 2023-09-21 LAB — TSH: TSH: 8.24 u[IU]/mL — ABNORMAL HIGH (ref 0.35–5.50)

## 2023-09-21 LAB — HEMOGLOBIN A1C: Hgb A1c MFr Bld: 6.2 % (ref 4.6–6.5)

## 2023-09-22 MED ORDER — LEVOTHYROXINE SODIUM 88 MCG PO TABS: 88.0000 ug | ORAL_TABLET | Freq: Every day | ORAL | 0 refills | Status: DC

## 2023-09-22 NOTE — Telephone Encounter (Signed)
 Noted.

## 2023-09-23 NOTE — Telephone Encounter (Signed)
 Spoke to pt about info since her sister was the one with the request. However, pt did not know that sister had request this change for her. Pt was apprehensive but agreeable and still confused. I advised that she speaks with her sister first then call back if she is fully agreeable. Pt verbalized understanding.

## 2023-09-24 ENCOUNTER — Telehealth: Payer: Self-pay | Admitting: *Deleted

## 2023-09-24 NOTE — Telephone Encounter (Signed)
 Left a message requesting the patient call back and leave a detailed message with the call center as to what her questions are and this message can be sent to PCP.

## 2023-09-24 NOTE — Telephone Encounter (Signed)
 Copied from CRM [id number]. Topic: Clinical - Lab/Test Results >> Sep 24, 2023  2:05 PM Alysia Jumbo S wrote: Reason for CRM: Patient returning missed phone call regarding lab results. Relayed results per providers not , verbatim. Patient has additional questions and request a callback. She states she will not be at home after 3:45 pm today and request a callback before this time or the next day.

## 2023-09-28 ENCOUNTER — Telehealth: Payer: Self-pay

## 2023-09-28 DIAGNOSIS — Z1231 Encounter for screening mammogram for malignant neoplasm of breast: Secondary | ICD-10-CM

## 2023-09-28 NOTE — Telephone Encounter (Signed)
 Copied from CRM [id number]. Topic: Clinical - Lab/Test Results >> Sep 28, 2023 11:25 AM Adonis Hoot wrote: Reason for CRM: Patient returned call to [facility] ans stated that she would like for her lab results to be explained to her again. She also would like an order for a mammogram to be sent out for her.Stated that she hasn't had one in a long time. Before 345 she is requesting a phone call

## 2023-09-28 NOTE — Telephone Encounter (Signed)
 Patient was informed of the results.  Message was sent to PCP for orders for the mammogram.  Patient stated she does not recall the location of her last mammogram as it was so long ago and is aware someone will contact her with appt info once order entered by PCP.

## 2023-09-28 NOTE — Addendum Note (Signed)
 Addended by: Aida House on: 09/28/2023 04:52 PM   Modules accepted: Orders

## 2023-09-28 NOTE — Telephone Encounter (Signed)
 Order placed

## 2023-09-29 ENCOUNTER — Telehealth: Payer: Self-pay

## 2023-09-29 NOTE — Telephone Encounter (Signed)
 Copied from CRM [id number]. Topic: General - Other >> Sep 29, 2023  1:33 PM Shardie S wrote: Reason for CRM: Att: Joann- Patient requesting a callback, no additional information provided. Requested to be called before 3:30

## 2023-09-30 ENCOUNTER — Ambulatory Visit: Payer: Self-pay

## 2023-09-30 NOTE — Telephone Encounter (Signed)
 Left a detailed message requesting the patient call back, leave the questions she has with the call center as to what her questions are and the message will be sent to PCP for review.

## 2023-09-30 NOTE — Telephone Encounter (Signed)
 This RN answered questions about why thyroid  medication was being increased. Patient was uncertain which medication this was in regarding to. This RN answered questions. No further questions at this time. This RN educated patient on importance of taking medication on empty stomach.   Copied from CRM [id number]. Topic: Clinical - Lab/Test Results >> Sep 30, 2023  1:17 PM Chuck Crater wrote: Reason for CRM: Patient has additional questions regarding lab result.

## 2023-10-01 NOTE — Telephone Encounter (Signed)
 The thyroid  medication needs to be on an empty stomach due to the way it is absorbed in the gut. She has to wait at least 90 minutes before eating or taking other medication. Please ask pt what labs she has questions about

## 2023-10-01 NOTE — Telephone Encounter (Signed)
 Left a detailed message with the information below at the patient's cell number.  Also requested she leave a message with the call center in regards to questions she has regarding lab testing and this can be sent to PCP for review.

## 2023-10-08 ENCOUNTER — Ambulatory Visit
Admission: RE | Admit: 2023-10-08 | Discharge: 2023-10-08 | Disposition: A | Discharge status: Home / Self Care | Source: Ambulatory Visit | Attending: Family Medicine

## 2023-10-08 ENCOUNTER — Ambulatory Visit
Admission: RE | Admit: 2023-10-08 | Discharge: 2023-10-08 | Disposition: A | Discharge status: Home / Self Care | Source: Ambulatory Visit | Attending: Family Medicine | Admitting: Family Medicine

## 2023-10-08 DIAGNOSIS — Z780 Asymptomatic menopausal state: Secondary | ICD-10-CM

## 2023-10-08 DIAGNOSIS — Z1231 Encounter for screening mammogram for malignant neoplasm of breast: Secondary | ICD-10-CM

## 2023-10-14 ENCOUNTER — Ambulatory Visit: Payer: Self-pay | Admitting: Family Medicine

## 2023-10-14 ENCOUNTER — Telehealth: Payer: Self-pay | Admitting: *Deleted

## 2023-10-14 NOTE — Telephone Encounter (Signed)
 Left a message requesting the patient call back and leave detailed information with the call center as to the exact location and/or imaging center she would prefer to have the bone density scheduled.

## 2023-10-14 NOTE — Telephone Encounter (Signed)
 Copied from CRM [id number]. Topic: Referral - Question >> Oct 14, 2023  2:04 PM Earnestine Goes B wrote: Reason for CRM: pt called to follow up on bone density testing, pt states provider referred her to on [address]. Pt is asking for a referral to a different bone density testing center in [place]. Please call pt back at [phone]  With a update

## 2023-10-15 ENCOUNTER — Telehealth: Payer: Self-pay | Admitting: *Deleted

## 2023-10-15 DIAGNOSIS — Z780 Asymptomatic menopausal state: Secondary | ICD-10-CM

## 2023-10-15 NOTE — Telephone Encounter (Signed)
 Spoke with [facility] at the [facility] for clarification as we were not aware they were no longer accepting new patients.  She stated they are booked out until January 2026 and will no longer be doing bone densities after this date.  Stated the [facility] has been recommending other [facility] locations such as [facility] and the [facility].  Order was re-entered for the [facility].

## 2023-10-15 NOTE — Telephone Encounter (Signed)
 Bone density was already ordered on 4/25-- can we check up on this? Usually my bone density orders are sent to the breast center... if she would like a different location please change the order. Thanks!

## 2023-10-15 NOTE — Telephone Encounter (Signed)
 Copied from CRM [id number]. Topic: Referral - Request for Referral >> Oct 14, 2023  4:39 PM Dewanda Foots wrote: Did the patient discuss referral with their provider in the last year? Yes (If No - schedule appointment) (If Yes - send message)  Appointment offered? No  Type of order/referral and detailed reason for visit: Pt is requesting referral for bone density testing  Preference of office, provider, location: States she has no idea where another bone density place is. States that the place on [address] does not take new patients. Anywhere in [place], maybe [facility] [place]? She states she will call back once she looks at the directions and sees where it is at.   If referral order, have you been seen by this specialty before? No (If Yes, this issue or another issue? When? Where?  Can we respond through MyChart? No

## 2023-10-15 NOTE — Telephone Encounter (Signed)
 Copied from CRM [id number]. Topic: Referral - Request for Referral >> Oct 14, 2023  4:39 PM Dewanda Foots wrote: Did the patient discuss referral with their provider in the last year? Yes (If No - schedule appointment) (If Yes - send message)  Appointment offered? No  Type of order/referral and detailed reason for visit: Pt is requesting referral for bone density testing  Preference of office, provider, location: States she has no idea where another bone density place is. States that the place on [address] does not take new patients. Anywhere in [place], maybe [facility] [place]? She states she will call back once she looks at the directions and sees where it is at.   If referral order, have you been seen by this specialty before? No (If Yes, this issue or another issue? When? Where?  Can we respond through MyChart? No >> Oct 15, 2023  3:09 PM Emylou G wrote: Prefer call after 4pm >> Oct 15, 2023  3:07 PM Emylou G wrote: Patient called.. checking status on bone density referral.. see attached message >> Oct 15, 2023  2:51 PM Chuck Crater wrote: Patient is wanting to speak with Nancyann Aye A in regards to bone density resting. She stated that the breast center on [address] stated that they no longer do Bone Density Testing and she wants to know where she should be going and when.

## 2023-10-15 NOTE — Telephone Encounter (Signed)
 Patient is needing a call back regarding a call from [employer] [facility]

## 2023-10-15 NOTE — Telephone Encounter (Signed)
 Thanks for letting me know-- I have been sending everyone there and I didn't know they were no longer taking new orders. Ok to close task

## 2023-10-16 ENCOUNTER — Telehealth: Payer: Self-pay | Admitting: Psychiatry

## 2023-10-16 DIAGNOSIS — F319 Bipolar disorder, unspecified: Secondary | ICD-10-CM

## 2023-10-16 NOTE — Telephone Encounter (Signed)
 Patient was informed someone will contact her with appt information from [facility] regarding the bone density test.

## 2023-10-16 NOTE — Telephone Encounter (Signed)
 Ayari called at 9:35 to request refill her Equetro  and Quetiapine .  I told her she has a refill of her Quetiapine  already and she can call [facility] and have them fill it.  But she does need a refill of the Equetro .  Appt 6/26.  Send to  [facility] - [place], [place] - [address] [facility] AT Portal to [employer] [employer]

## 2023-10-16 NOTE — Telephone Encounter (Signed)
 See prior phone note.

## 2023-10-20 MED ORDER — EQUETRO 200 MG PO CP12: 400.0000 mg | ORAL_CAPSULE | Freq: Every day | ORAL | 0 refills | Status: DC

## 2023-10-20 NOTE — Telephone Encounter (Signed)
 Rx for Equetro  sent to [facility].

## 2023-10-26 DIAGNOSIS — H2513 Age-related nuclear cataract, bilateral: Secondary | ICD-10-CM | POA: Diagnosis not present

## 2023-10-29 ENCOUNTER — Other Ambulatory Visit (HOSPITAL_BASED_OUTPATIENT_CLINIC_OR_DEPARTMENT_OTHER)

## 2023-10-30 ENCOUNTER — Telehealth: Payer: Self-pay | Admitting: Licensed Clinical Social Worker

## 2023-11-02 ENCOUNTER — Other Ambulatory Visit

## 2023-11-19 ENCOUNTER — Encounter: Payer: Self-pay | Admitting: Psychiatry

## 2023-11-19 ENCOUNTER — Ambulatory Visit: Payer: Medicare Other | Admitting: Psychiatry

## 2023-11-19 DIAGNOSIS — G3184 Mild cognitive impairment, so stated: Secondary | ICD-10-CM

## 2023-11-19 DIAGNOSIS — F411 Generalized anxiety disorder: Secondary | ICD-10-CM

## 2023-11-19 DIAGNOSIS — F5104 Psychophysiologic insomnia: Secondary | ICD-10-CM | POA: Diagnosis not present

## 2023-11-19 DIAGNOSIS — R7989 Other specified abnormal findings of blood chemistry: Secondary | ICD-10-CM | POA: Diagnosis not present

## 2023-11-19 DIAGNOSIS — F319 Bipolar disorder, unspecified: Secondary | ICD-10-CM | POA: Diagnosis not present

## 2023-11-19 MED ORDER — EQUETRO 200 MG PO CP12: 400.0000 mg | ORAL_CAPSULE | Freq: Every day | ORAL | 0 refills | Status: DC

## 2023-11-19 MED ORDER — QUETIAPINE FUMARATE 300 MG PO TABS: 300.0000 mg | ORAL_TABLET | Freq: Every day | ORAL | 1 refills | Status: DC
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
## 2023-11-23 DIAGNOSIS — C44329 Squamous cell carcinoma of skin of other parts of face: Secondary | ICD-10-CM | POA: Diagnosis not present

## 2023-12-23 ENCOUNTER — Telehealth: Payer: Self-pay | Admitting: *Deleted

## 2023-12-23 NOTE — Telephone Encounter (Signed)
 Copied from CRM [id number]. Topic: Referral - Question >> Dec 23, 2023  1:59 PM Frederich PARAS wrote: Reason for CRM: PT calling in she wants to get a message back to her provider dr. Ozell, she wants to know who does bone density test if dr heddie dont offer it.

## 2024-01-05 ENCOUNTER — Other Ambulatory Visit: Payer: Self-pay | Admitting: Family Medicine

## 2024-01-05 DIAGNOSIS — E039 Hypothyroidism, unspecified: Secondary | ICD-10-CM

## 2024-01-06 ENCOUNTER — Other Ambulatory Visit: Payer: Self-pay | Admitting: Family Medicine

## 2024-01-06 DIAGNOSIS — E039 Hypothyroidism, unspecified: Secondary | ICD-10-CM

## 2024-01-07 ENCOUNTER — Other Ambulatory Visit: Payer: Self-pay | Admitting: Family Medicine

## 2024-01-07 DIAGNOSIS — E039 Hypothyroidism, unspecified: Secondary | ICD-10-CM

## 2024-01-12 ENCOUNTER — Telehealth: Payer: Self-pay | Admitting: *Deleted

## 2024-01-12 NOTE — Telephone Encounter (Signed)
Message sent to referral coordinator for assistance.

## 2024-01-12 NOTE — Telephone Encounter (Signed)
 Copied from CRM [id number]. Topic: Referral - Question >> Jan 12, 2024  4:00 PM Aisha D wrote: Reason for CRM: Pt is requesting a bone density test and would like to have a referral submitted. Pt would like a callback with an update on the request.

## 2024-01-14 ENCOUNTER — Telehealth: Payer: Self-pay | Admitting: *Deleted

## 2024-01-14 NOTE — Telephone Encounter (Signed)
 Pt called again checking on the status of this referral. She has been calling for a while now. It was placed back in May.   Copied from CRM [id number]. Topic: Referral - Question >> Jan 13, 2024  6:00 PM Rebecca Novak wrote: Patient is requesting a callback with update.

## 2024-01-14 NOTE — Telephone Encounter (Signed)
 Copied from CRM #[id number]. Topic: Referral - Status >> Jan 14, 2024  2:59 PM Shereese L wrote: Reason for CRM: Patient is requesting a callback with update on getting a bone density referral.

## 2024-01-15 NOTE — Telephone Encounter (Signed)
 Spoke with Lesley at the [facility] as the order was entered in May and she stated they have tried contacting the patient a number of times.  I left a detailed message at the patient's cell number with this information and advised her to call the imaging center at [phone].

## 2024-02-02 ENCOUNTER — Ambulatory Visit: Payer: Self-pay | Admitting: Family Medicine

## 2024-02-02 NOTE — Telephone Encounter (Signed)
 FYI Only or Action Required?: Action required by provider: update on patient condition.  Patient was last seen in primary care on 09/18/2023 by Ozell Heron HERO, MD.  Called Nurse Triage reporting Fall.  Symptoms began several days ago.  Interventions attempted: OTC medications: Aleve and not doing a whole lot.  Symptoms are: unchanged.  Triage Disposition: See PCP When Office is Open (Within 3 Days)  Patient/caregiver understands and will follow disposition?: No, wishes to speak with PCP         Copied from CRM [id number]. Topic: Clinical - Red Word Triage >> Feb 02, 2024  2:32 PM Robinson H wrote: [facility] that prompted transfer to Nurse Triage: Clemens a couple days ago on right shoulder in a lot of pain, looking to obtain something for pain. Reason for Disposition  [1] MODERATE pain (e.g., interferes with normal activities) AND [2] present > 3 days  Answer Assessment - Initial Assessment Questions Pt refused scheduling an appointment at this time and wants something called in to the below pharmacy for her symptoms:   [facility] [address] [place] - [address] [place] [address] Phone: [phone]  Fax: [phone]   This RN let pt know that her PCP may want her to come into office. Pt call back number is [phone].   ONSET: When did the pain start?     A couple days ago after a fall; pt is not sure what caused her to fall; I might have a just tripped  LOCATION: Where is the pain located?     Right shoulder after a fall; denies losing consciousness or hitting head  PAIN: How bad is the pain? (Scale 1-10; or mild, moderate, severe)     6-7/10 pain, pretty constant'  Denies bruising, swelling, numbness  Protocols used: Shoulder Pain-A-AH

## 2024-02-02 NOTE — Telephone Encounter (Signed)
 Left a detailed message at the patient's cell number stating to call the office as a visit is needed for evaluation prior to medications being called in.

## 2024-02-03 NOTE — Telephone Encounter (Signed)
 Noted- ok to close.

## 2024-03-18 ENCOUNTER — Ambulatory Visit: Admitting: Family Medicine

## 2024-04-01 ENCOUNTER — Ambulatory Visit (INDEPENDENT_AMBULATORY_CARE_PROVIDER_SITE_OTHER): Admitting: Family Medicine

## 2024-04-01 ENCOUNTER — Encounter: Payer: Self-pay | Admitting: Family Medicine

## 2024-04-01 DIAGNOSIS — E785 Hyperlipidemia, unspecified: Secondary | ICD-10-CM | POA: Diagnosis not present

## 2024-04-01 DIAGNOSIS — E039 Hypothyroidism, unspecified: Secondary | ICD-10-CM | POA: Diagnosis not present

## 2024-04-01 LAB — TSH: TSH: 2.12 u[IU]/mL (ref 0.35–5.50)

## 2024-04-01 MED ORDER — ROSUVASTATIN CALCIUM 10 MG PO TABS: 10.0000 mg | ORAL_TABLET | Freq: Every day | ORAL | 1 refills | Status: AC

## 2024-04-01 MED ORDER — LEVOTHYROXINE SODIUM 88 MCG PO TABS: 88.0000 ug | ORAL_TABLET | Freq: Every day | ORAL | 1 refills | Status: AC

## 2024-04-01 NOTE — Progress Notes (Unsigned)
   Established Patient Office Visit  Subjective   Patient ID: Rebecca Novak, female    DOB: 11-02-1945  Age: 78 y.o. MRN: [id number]  Chief Complaint  Patient presents with   Medical Management of Chronic Issues    HPI   Current Outpatient Medications  Medication Instructions   cholecalciferol (VITAMIN D3) 1,000 Units, Daily   Equetro  400 mg, Oral, Daily   levothyroxine  (SYNTHROID ) 88 mcg, Oral, Daily   QUEtiapine  (SEROQUEL ) 300 mg, Oral, Daily at bedtime   rosuvastatin  (CRESTOR ) 10 mg, Oral, Daily   VITAMIN E PO Daily    Patient Active Problem List   Diagnosis Date Noted   Aortic valve sclerosis 12/05/2022   Rash and nonspecific skin eruption 01/05/2022   Vitamin D  deficiency 01/03/2022   Pes anserinus bursitis of right knee 07/18/2019   Pain in right knee 06/20/2019   Osteoarthritis of right glenohumeral joint 06/30/2018   GAD (generalized anxiety disorder) 03/16/2018   Normocytic normochromic anemia 02/24/2018   Recurrent falls 01/18/2018   Bipolar disorder (HCC) 01/18/2018   Palpitations 12/02/2017   Syncope 10/26/2015   Weakness of both legs 02/15/2013   Orthostatic hypotension 02/15/2013   HAND PAIN 07/31/2008   Anemia 05/17/2008   GLUCOSE INTOLERANCE 11/23/2007   Systolic ejection murmur 05/17/2007   History of colonic polyps 05/17/2007   Hypothyroidism 02/05/2007   Dyslipidemia 02/05/2007     ROS    Objective:     BP 136/60   Pulse 67   Temp 98 F (36.7 C) (Oral)   Ht 5' 8 (1.727 m)   Wt 183 lb 9.6 oz (83.3 kg)   SpO2 98%   BMI 27.92 kg/m  {Vitals History (Optional):23777}  Physical Exam Vitals reviewed.      No results found for any visits on 04/01/24.  {Labs (Optional):23779}  The ASCVD Risk score (Arnett DK, et al., 2019) failed to calculate for the following reasons:   The 2019 ASCVD risk score is only valid for ages 52 to 66    Assessment & Plan:  Acquired hypothyroidism -     Levothyroxine  Sodium; Take 1 tablet (88  mcg total) by mouth daily.  Dispense: 90 tablet; Refill: 1 -     TSH; Future  Dyslipidemia -     Rosuvastatin  Calcium ; Take 1 tablet (10 mg total) by mouth daily.  Dispense: 90 tablet; Refill: 1     Return in about 5 months (around 09/08/2024) for AWV -- with me in person.    Heron CHRISTELLA Sharper, MD

## 2024-04-04 ENCOUNTER — Ambulatory Visit: Payer: Self-pay | Admitting: Family Medicine

## 2024-04-15 ENCOUNTER — Other Ambulatory Visit: Payer: Self-pay | Admitting: Psychiatry

## 2024-04-15 DIAGNOSIS — F319 Bipolar disorder, unspecified: Secondary | ICD-10-CM

## 2024-05-17 ENCOUNTER — Other Ambulatory Visit: Payer: Self-pay | Admitting: Psychiatry

## 2024-05-17 DIAGNOSIS — F319 Bipolar disorder, unspecified: Secondary | ICD-10-CM

## 2024-05-17 DIAGNOSIS — R4182 Altered mental status, unspecified: Secondary | ICD-10-CM

## 2024-05-17 DIAGNOSIS — G3184 Mild cognitive impairment, so stated: Secondary | ICD-10-CM

## 2024-05-31 ENCOUNTER — Ambulatory Visit: Admitting: Psychiatry

## 2024-05-31 ENCOUNTER — Encounter: Payer: Self-pay | Admitting: Psychiatry

## 2024-05-31 DIAGNOSIS — F411 Generalized anxiety disorder: Secondary | ICD-10-CM | POA: Diagnosis not present

## 2024-05-31 DIAGNOSIS — F319 Bipolar disorder, unspecified: Secondary | ICD-10-CM | POA: Diagnosis not present

## 2024-05-31 DIAGNOSIS — F5104 Psychophysiologic insomnia: Secondary | ICD-10-CM | POA: Diagnosis not present

## 2024-05-31 MED ORDER — EQUETRO 200 MG PO CP12: 400.0000 mg | ORAL_CAPSULE | Freq: Every day | ORAL | 0 refills | Status: AC

## 2024-05-31 MED ORDER — QUETIAPINE FUMARATE 300 MG PO TABS: 300.0000 mg | ORAL_TABLET | Freq: Every day | ORAL | 1 refills | Status: AC
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
## 2024-09-01 ENCOUNTER — Other Ambulatory Visit (HOSPITAL_BASED_OUTPATIENT_CLINIC_OR_DEPARTMENT_OTHER)

## 2024-09-14 ENCOUNTER — Ambulatory Visit: Admitting: Family Medicine

## 2024-12-01 ENCOUNTER — Ambulatory Visit: Admitting: Psychiatry
